# Patient Record
Sex: Male | Born: 1935
Health system: Southern US, Community
[De-identification: ages and names within clinical notes are randomized; demographics above are authoritative.]

## PROBLEM LIST (undated history)

## (undated) DIAGNOSIS — K449 Diaphragmatic hernia without obstruction or gangrene: Secondary | ICD-10-CM

## (undated) DIAGNOSIS — I10 Essential (primary) hypertension: Secondary | ICD-10-CM

## (undated) DIAGNOSIS — K922 Gastrointestinal hemorrhage, unspecified: Secondary | ICD-10-CM

## (undated) DIAGNOSIS — Z5189 Encounter for other specified aftercare: Secondary | ICD-10-CM

## (undated) DIAGNOSIS — S2249XA Multiple fractures of ribs, unspecified side, initial encounter for closed fracture: Secondary | ICD-10-CM

## (undated) DIAGNOSIS — S62109A Fracture of unspecified carpal bone, unspecified wrist, initial encounter for closed fracture: Secondary | ICD-10-CM

## (undated) DIAGNOSIS — N189 Chronic kidney disease, unspecified: Secondary | ICD-10-CM

## (undated) DIAGNOSIS — H5350 Unspecified color vision deficiencies: Secondary | ICD-10-CM

## (undated) DIAGNOSIS — K219 Gastro-esophageal reflux disease without esophagitis: Secondary | ICD-10-CM

## (undated) DIAGNOSIS — S069XAA Unspecified intracranial injury with loss of consciousness status unknown, initial encounter: Secondary | ICD-10-CM

## (undated) DIAGNOSIS — C4491 Basal cell carcinoma of skin, unspecified: Secondary | ICD-10-CM

## (undated) DIAGNOSIS — I82409 Acute embolism and thrombosis of unspecified deep veins of unspecified lower extremity: Secondary | ICD-10-CM

## (undated) DIAGNOSIS — R480 Dyslexia and alexia: Secondary | ICD-10-CM

## (undated) HISTORY — DX: Acute embolism and thrombosis of unspecified deep veins of unspecified lower extremity: I82.409

## (undated) HISTORY — PX: SPINE SURGERY: SHX786

## (undated) HISTORY — DX: Encounter for other specified aftercare: Z51.89

## (undated) HISTORY — PX: HERNIA REPAIR: SHX51

## (undated) HISTORY — PX: TONSILLECTOMY: SUR1361

## (undated) HISTORY — PX: CRANIECTOMY FOR DEPRESSED SKULL FRACTURE: SHX5788

---

## 2005-12-12 ENCOUNTER — Emergency Department: Payer: Self-pay | Admitting: Emergency Medicine

## 2007-03-08 ENCOUNTER — Emergency Department: Payer: Self-pay | Admitting: Unknown Physician Specialty

## 2007-04-30 ENCOUNTER — Inpatient Hospital Stay: Payer: Self-pay | Admitting: Internal Medicine

## 2007-07-03 ENCOUNTER — Ambulatory Visit: Payer: Self-pay | Admitting: Gastroenterology

## 2010-05-02 LAB — HM COLONOSCOPY

## 2011-08-30 ENCOUNTER — Ambulatory Visit: Payer: Self-pay | Admitting: Family Medicine

## 2012-05-02 HISTORY — PX: COLONOSCOPY: SHX174

## 2012-12-24 ENCOUNTER — Ambulatory Visit: Payer: Self-pay | Admitting: Emergency Medicine

## 2012-12-26 ENCOUNTER — Ambulatory Visit: Payer: Self-pay | Admitting: Family Medicine

## 2015-04-10 ENCOUNTER — Ambulatory Visit (INDEPENDENT_AMBULATORY_CARE_PROVIDER_SITE_OTHER): Payer: Medicare Other | Admitting: Family Medicine

## 2015-04-10 ENCOUNTER — Encounter: Payer: Self-pay | Admitting: Family Medicine

## 2015-04-10 VITALS — BP 110/62 | HR 88 | Temp 97.6°F | Ht 70.0 in | Wt 177.0 lb

## 2015-04-10 DIAGNOSIS — H66011 Acute suppurative otitis media with spontaneous rupture of ear drum, right ear: Secondary | ICD-10-CM | POA: Diagnosis not present

## 2015-04-10 DIAGNOSIS — J0100 Acute maxillary sinusitis, unspecified: Secondary | ICD-10-CM | POA: Diagnosis not present

## 2015-04-10 MED ORDER — OFLOXACIN 0.3 % OT SOLN: 5.0000 [drp] | Freq: Every day | OTIC | Status: DC

## 2015-04-10 MED ORDER — AMOXICILLIN-POT CLAVULANATE 875-125 MG PO TABS: 1.0000 | ORAL_TABLET | Freq: Two times a day (BID) | ORAL | Status: DC

## 2015-04-10 NOTE — Progress Notes (Signed)
Name: Clayton [name]   MRN: [id number]    DOB: 1934/09/01   Date:04/10/2015       Progress Note  Subjective  Chief Complaint  Chief Complaint  Patient presents with  . Sinusitis    drainage x 4 days- R) ear pain, cough, nasal drainage with yellow production    Sinusitis This is a new problem. The current episode started in the past 7 days. The problem has been waxing and waning since onset. There has been no fever. His pain is at a severity of 1/10 ("nuisence" ear). The pain is mild. Associated symptoms include congestion, coughing, ear pain and sinus pressure. Pertinent negatives include no chills, diaphoresis, headaches, hoarse voice, neck pain, shortness of breath, sneezing, sore throat or swollen glands. Past treatments include acetaminophen and oral decongestants. The treatment provided mild relief.    No problem-specific assessment & plan notes found for this encounter.   History reviewed. No pertinent past medical history.  Past Surgical History  Procedure Laterality Date  . Craniectomy for depressed skull fracture    . Colonoscopy  2014    cleared for 5 yrs    Family History  Problem Relation Age of Onset  . Diabetes Father     Social History   Social History  . Marital Status: Married    Spouse Name: N/A  . Number of Children: N/A  . Years of Education: N/A   Occupational History  . Not on file.   Social History Main Topics  . Smoking status: Former Research scientist (life sciences)  . Smokeless tobacco: Not on file  . Alcohol Use: 0.0 oz/week    0 Standard drinks or equivalent per week  . Drug Use: No  . Sexual Activity: Not Currently   Other Topics Concern  . Not on file   Social History Narrative  . No narrative on file    Allergies  Allergen Reactions  . Sulfa Antibiotics      Review of Systems  Constitutional: Negative for fever, chills, weight loss, malaise/fatigue and diaphoresis.  HENT: Positive for congestion, ear pain and sinus pressure. Negative for ear  discharge, hoarse voice, sneezing and sore throat.   Eyes: Negative for blurred vision.  Respiratory: Positive for cough. Negative for sputum production, shortness of breath and wheezing.   Cardiovascular: Negative for chest pain, palpitations and leg swelling.  Gastrointestinal: Negative for heartburn, nausea, abdominal pain, diarrhea, constipation, blood in stool and melena.  Genitourinary: Negative for dysuria, urgency, frequency and hematuria.  Musculoskeletal: Negative for myalgias, back pain, joint pain and neck pain.  Skin: Negative for rash.  Neurological: Negative for dizziness, tingling, sensory change, focal weakness and headaches.  Endo/Heme/Allergies: Negative for environmental allergies and polydipsia. Does not bruise/bleed easily.  Psychiatric/Behavioral: Negative for depression and suicidal ideas. The patient is not nervous/anxious and does not have insomnia.      Objective  Filed Vitals:   04/10/15 0854  BP: 110/62  Pulse: 88  Temp: 97.6 F (36.4 C)  TempSrc: Oral  Height: 5\' 10"  (1.778 m)  Weight: 177 lb (80.287 kg)    Physical Exam  Constitutional: He is oriented to person, place, and time and well-developed, well-nourished, and in no distress.  HENT:  Head: Normocephalic.  Right Ear: External ear normal. Tympanic membrane is perforated and erythematous.  Left Ear: Hearing, tympanic membrane, external ear and ear canal normal.  Nose: Nose normal.  Mouth/Throat: Oropharynx is clear and moist.  Eyes: Conjunctivae and EOM are normal. Pupils are equal, round, and reactive to light.  Right eye exhibits no discharge. Left eye exhibits no discharge. No scleral icterus.  Neck: Normal range of motion. Neck supple. No JVD present. No tracheal deviation present. No thyromegaly present.  Cardiovascular: Normal rate, regular rhythm, normal heart sounds and intact distal pulses.  Exam reveals no gallop and no friction rub.   No murmur heard. Pulmonary/Chest: Breath sounds  normal. No respiratory distress. He has no wheezes. He has no rales.  Abdominal: Soft. Bowel sounds are normal. He exhibits no mass. There is no hepatosplenomegaly. There is no tenderness. There is no rebound, no guarding and no CVA tenderness.  Musculoskeletal: Normal range of motion. He exhibits no edema or tenderness.  Lymphadenopathy:    He has no cervical adenopathy.  Neurological: He is alert and oriented to person, place, and time. He has normal sensation, normal strength, normal reflexes and intact cranial nerves. No cranial nerve deficit.  Skin: Skin is warm. No rash noted.  Psychiatric: Mood and affect normal.      Assessment & Plan  Problem List Items Addressed This Visit    None    Visit Diagnoses    Acute suppurative otitis media of right ear with spontaneous rupture of tympanic membrane, recurrence not specified    -  Primary    Relevant Medications    amoxicillin-clavulanate (AUGMENTIN) 875-125 MG tablet    ofloxacin (FLOXIN OTIC) 0.3 % otic solution    Other Relevant Orders    Ambulatory referral to ENT    Acute maxillary sinusitis, recurrence not specified        Relevant Medications    amoxicillin-clavulanate (AUGMENTIN) 875-125 MG tablet         Dr. Deanna Jones [facility]  04/10/2015

## 2015-12-15 ENCOUNTER — Ambulatory Visit (INDEPENDENT_AMBULATORY_CARE_PROVIDER_SITE_OTHER): Payer: Medicare Other | Admitting: Family Medicine

## 2015-12-15 ENCOUNTER — Encounter: Payer: Self-pay | Admitting: Family Medicine

## 2015-12-15 VITALS — BP 120/78 | HR 68 | Ht 70.0 in | Wt 175.0 lb

## 2015-12-15 DIAGNOSIS — L309 Dermatitis, unspecified: Secondary | ICD-10-CM | POA: Diagnosis not present

## 2015-12-15 MED ORDER — TRIAMCINOLONE ACETONIDE 0.1 % EX CREA: 1.0000 "application " | TOPICAL_CREAM | Freq: Two times a day (BID) | CUTANEOUS | 0 refills | Status: DC

## 2015-12-15 MED ORDER — DOXYCYCLINE HYCLATE 100 MG PO TABS: 100.0000 mg | ORAL_TABLET | Freq: Two times a day (BID) | ORAL | 0 refills | Status: DC

## 2015-12-15 NOTE — Progress Notes (Signed)
Name: Clayton [name]   MRN: [id number]    DOB: 04-May-1934   Date:12/15/2015       Progress Note  Subjective  Chief Complaint  Chief Complaint  Patient presents with  . Rash    bumps at the top R) side of back- itches x 1 month    Rash  This is a new problem. The current episode started more than 1 month ago. The problem has been gradually worsening since onset. The affected locations include the back (upper back). The rash is characterized by itchiness. He was exposed to nothing. Pertinent negatives include no anorexia, congestion, cough, diarrhea, fever, joint pain, shortness of breath or sore throat. Past treatments include antibiotic cream. The treatment provided mild relief. His past medical history is significant for eczema.    No problem-specific Assessment & Plan notes found for this encounter.   History reviewed. No pertinent past medical history.  Past Surgical History:  Procedure Laterality Date  . COLONOSCOPY  2014   cleared for 5 yrs  . CRANIECTOMY FOR DEPRESSED SKULL FRACTURE      Family History  Problem Relation Age of Onset  . Diabetes Father     Social History   Social History  . Marital status: Married    Spouse name: N/A  . Number of children: N/A  . Years of education: N/A   Occupational History  . Not on file.   Social History Main Topics  . Smoking status: Former Research scientist (life sciences)  . Smokeless tobacco: Never Used  . Alcohol use 0.0 oz/week  . Drug use: No  . Sexual activity: Not Currently   Other Topics Concern  . Not on file   Social History Narrative  . No narrative on file    Allergies  Allergen Reactions  . Sulfa Antibiotics      Review of Systems  Constitutional: Negative for chills, diaphoresis, fever, malaise/fatigue and weight loss.  HENT: Negative for congestion, ear discharge, ear pain, hearing loss, nosebleeds, sore throat and tinnitus.   Eyes: Negative for blurred vision and double vision.  Respiratory: Negative for cough,  sputum production, shortness of breath and wheezing.   Cardiovascular: Negative for chest pain, palpitations and leg swelling.  Gastrointestinal: Negative for abdominal pain, anorexia, blood in stool, constipation, diarrhea, heartburn, melena and nausea.  Genitourinary: Negative for dysuria, frequency, hematuria and urgency.  Musculoskeletal: Negative for back pain, joint pain, myalgias and neck pain.  Skin: Positive for rash.  Neurological: Negative for dizziness, tingling, sensory change, focal weakness, weakness and headaches.  Endo/Heme/Allergies: Negative for environmental allergies and polydipsia. Does not bruise/bleed easily.  Psychiatric/Behavioral: Negative for depression and suicidal ideas. The patient is not nervous/anxious and does not have insomnia.      Objective  Vitals:   12/15/15 1437  BP: 120/78  Pulse: 68  Weight: 175 lb (79.4 kg)  Height: 5\' 10"  (1.778 m)    Physical Exam  Constitutional: He is oriented to person, place, and time and well-developed, well-nourished, and in no distress.  HENT:  Head: Normocephalic.  Right Ear: External ear normal.  Left Ear: External ear normal.  Nose: Nose normal.  Mouth/Throat: Oropharynx is clear and moist.  Eyes: Conjunctivae and EOM are normal. Pupils are equal, round, and reactive to light. Right eye exhibits no discharge. Left eye exhibits no discharge. No scleral icterus.  Neck: Normal range of motion. Neck supple. No JVD present. No tracheal deviation present. No thyromegaly present.  Cardiovascular: Normal rate, regular rhythm, normal heart sounds and intact distal  pulses.  Exam reveals no gallop and no friction rub.   No murmur heard. Pulmonary/Chest: Breath sounds normal. No respiratory distress. He has no wheezes. He has no rales.  Abdominal: Soft. Bowel sounds are normal. He exhibits no mass. There is no hepatosplenomegaly. There is no tenderness. There is no rebound, no guarding and no CVA tenderness.   Musculoskeletal: Normal range of motion. He exhibits no edema or tenderness.  Lymphadenopathy:    He has no cervical adenopathy.  Neurological: He is alert and oriented to person, place, and time. He has normal sensation, normal strength, normal reflexes and intact cranial nerves. No cranial nerve deficit.  Skin: Skin is warm. No rash noted.  Psychiatric: Mood and affect normal.  Nursing note and vitals reviewed.     Assessment & Plan  Problem List Items Addressed This Visit    None    Visit Diagnoses    Eczema    -  Primary   Relevant Medications   triamcinolone cream (KENALOG) 0.1 %   doxycycline (VIBRA-TABS) 100 MG tablet        Dr. Macon [facility]  12/15/15

## 2015-12-15 NOTE — Patient Instructions (Signed)
Eczema Eczema, also called atopic dermatitis, is a skin disorder that causes inflammation of the skin. It causes a red rash and dry, scaly skin. The skin becomes very itchy. Eczema is generally worse during the cooler winter months and often improves with the warmth of summer. Eczema usually starts showing signs in infancy. Some children outgrow eczema, but it may last through adulthood.  CAUSES  The exact cause of eczema is not known, but it appears to run in families. People with eczema often have a family history of eczema, allergies, asthma, or hay fever. Eczema is not contagious. Flare-ups of the condition may be caused by:   Contact with something you are sensitive or allergic to.   Stress. SIGNS AND SYMPTOMS  Dry, scaly skin.   Red, itchy rash.   Itchiness. This may occur before the skin rash and may be very intense.  DIAGNOSIS  The diagnosis of eczema is usually made based on symptoms and medical history. TREATMENT  Eczema cannot be cured, but symptoms usually can be controlled with treatment and other strategies. A treatment plan might include:  Controlling the itching and scratching.   Use over-the-counter antihistamines as directed for itching. This is especially useful at night when the itching tends to be worse.   Use over-the-counter steroid creams as directed for itching.   Avoid scratching. Scratching makes the rash and itching worse. It may also result in a skin infection (impetigo) due to a break in the skin caused by scratching.   Keeping the skin well moisturized with creams every day. This will seal in moisture and help prevent dryness. Lotions that contain alcohol and water should be avoided because they can dry the skin.   Limiting exposure to things that you are sensitive or allergic to (allergens).   Recognizing situations that cause stress.   Developing a plan to manage stress.  HOME CARE INSTRUCTIONS   Only take over-the-counter or  prescription medicines as directed by your health care provider.   Do not use anything on the skin without checking with your health care provider.   Keep baths or showers short (5 minutes) in warm (not hot) water. Use mild cleansers for bathing. These should be unscented. You may add nonperfumed bath oil to the bath water. It is best to avoid soap and bubble bath.   Immediately after a bath or shower, when the skin is still damp, apply a moisturizing ointment to the entire body. This ointment should be a petroleum ointment. This will seal in moisture and help prevent dryness. The thicker the ointment, the better. These should be unscented.   Keep fingernails cut short. Children with eczema may need to wear soft gloves or mittens at night after applying an ointment.   Dress in clothes made of cotton or cotton blends. Dress lightly, because heat increases itching.   A child with eczema should stay away from anyone with fever blisters or cold sores. The virus that causes fever blisters (herpes simplex) can cause a serious skin infection in children with eczema. SEEK MEDICAL CARE IF:   Your itching interferes with sleep.   Your rash gets worse or is not better within 1 week after starting treatment.   You see pus or soft yellow scabs in the rash area.   You have a fever.   You have a rash flare-up after contact with someone who has fever blisters.    This information is not intended to replace advice given to you by your health care   provider. Make sure you discuss any questions you have with your health care provider.   Document Released: 04/15/2000 Document Revised: 02/06/2013 Document Reviewed: 11/19/2012 Elsevier Interactive Patient Education 2016 [employer] Inc[employer]  

## 2015-12-16 ENCOUNTER — Ambulatory Visit (INDEPENDENT_AMBULATORY_CARE_PROVIDER_SITE_OTHER): Payer: Medicare Other | Admitting: Family Medicine

## 2015-12-16 ENCOUNTER — Encounter: Payer: Self-pay | Admitting: Family Medicine

## 2015-12-16 VITALS — BP 118/84 | HR 76 | Ht 70.0 in | Wt 175.0 lb

## 2015-12-16 DIAGNOSIS — R3911 Hesitancy of micturition: Secondary | ICD-10-CM

## 2015-12-16 DIAGNOSIS — Z1211 Encounter for screening for malignant neoplasm of colon: Secondary | ICD-10-CM

## 2015-12-16 DIAGNOSIS — Z0000 Encounter for general adult medical examination without abnormal findings: Secondary | ICD-10-CM | POA: Diagnosis not present

## 2015-12-16 LAB — POCT URINALYSIS DIPSTICK
Bilirubin, UA: NEGATIVE
Blood, UA: NEGATIVE
Glucose, UA: NEGATIVE
Ketones, UA: NEGATIVE
LEUKOCYTES UA: NEGATIVE
Nitrite, UA: NEGATIVE
PROTEIN UA: NEGATIVE
Spec Grav, UA: 1.02
UROBILINOGEN UA: 0.2
pH, UA: 6

## 2015-12-16 LAB — POC HEMOCCULT BLD/STL (OFFICE/1-CARD/DIAGNOSTIC): Card #1 Date: NEGATIVE

## 2015-12-16 NOTE — Progress Notes (Signed)
Name: Clayton [name]   MRN: [id number]    DOB: 26-Jan-1935   Date:12/16/2015       Progress Note  Subjective  Chief Complaint  Chief Complaint  Patient presents with  . Annual Exam    no problems    Patient presents for annual physical exam.    No problem-specific Assessment & Plan notes found for this encounter.   History reviewed. No pertinent past medical history.  Past Surgical History:  Procedure Laterality Date  . COLONOSCOPY  2014   cleared for 5 yrs  . CRANIECTOMY FOR DEPRESSED SKULL FRACTURE      Family History  Problem Relation Age of Onset  . Diabetes Father     Social History   Social History  . Marital status: Married    Spouse name: N/A  . Number of children: N/A  . Years of education: N/A   Occupational History  . Not on file.   Social History Main Topics  . Smoking status: Former Research scientist (life sciences)  . Smokeless tobacco: Never Used  . Alcohol use 0.0 oz/week  . Drug use: No  . Sexual activity: Not Currently   Other Topics Concern  . Not on file   Social History Narrative  . No narrative on file    Allergies  Allergen Reactions  . Sulfa Antibiotics      Review of Systems  Constitutional: Negative for chills, fever, malaise/fatigue and weight loss.  HENT: Negative for ear discharge, ear pain and sore throat.   Eyes: Negative for blurred vision.  Respiratory: Negative for cough, sputum production, shortness of breath and wheezing.   Cardiovascular: Negative for chest pain, palpitations and leg swelling.  Gastrointestinal: Negative for abdominal pain, blood in stool, constipation, diarrhea, heartburn, melena and nausea.  Genitourinary: Negative for dysuria, frequency, hematuria and urgency.  Musculoskeletal: Negative for back pain, joint pain, myalgias and neck pain.  Skin: Negative for rash.  Neurological: Negative for dizziness, tingling, sensory change, focal weakness and headaches.  Endo/Heme/Allergies: Negative for environmental  allergies and polydipsia. Does not bruise/bleed easily.  Psychiatric/Behavioral: Negative for depression and suicidal ideas. The patient is not nervous/anxious and does not have insomnia.      Objective  Vitals:   12/16/15 0845  BP: 118/84  Pulse: 76  Weight: 175 lb (79.4 kg)  Height: 5\' 10"  (1.778 m)    Physical Exam  Constitutional: He is oriented to person, place, and time and well-developed, well-nourished, and in no distress.  HENT:  Head: Normocephalic.  Right Ear: External ear normal.  Left Ear: External ear normal.  Nose: Nose normal.  Mouth/Throat: Oropharynx is clear and moist.  Eyes: Conjunctivae and EOM are normal. Pupils are equal, round, and reactive to light. Right eye exhibits no discharge. Left eye exhibits no discharge. No scleral icterus.  Fundoscopic exam:      The right eye shows no arteriolar narrowing, no AV nicking and no papilledema.       The left eye shows no arteriolar narrowing, no AV nicking and no papilledema.  Neck: Normal range of motion. Neck supple. No JVD present. No tracheal deviation present. No thyromegaly present.  Cardiovascular: Normal rate, regular rhythm, normal heart sounds, intact distal pulses and normal pulses.  Exam reveals no gallop and no friction rub.   No murmur heard. Pulmonary/Chest: Breath sounds normal. No respiratory distress. He has no wheezes. He has no rales. He exhibits no tenderness.  Abdominal: Soft. Bowel sounds are normal. He exhibits no mass. There is no hepatosplenomegaly. There  is no tenderness. There is no rebound, no guarding and no CVA tenderness.  Genitourinary: Rectum normal and prostate normal. Rectal exam shows guaiac negative stool. No discharge found.  Musculoskeletal: Normal range of motion. He exhibits no edema or tenderness.  Lymphadenopathy:    He has no cervical adenopathy.  Neurological: He is alert and oriented to person, place, and time. He has normal sensation, normal strength, normal reflexes  and intact cranial nerves. No cranial nerve deficit.  Skin: Skin is warm. No rash noted.  Psychiatric: Mood and affect normal.  Vitals reviewed.     Assessment & Plan  Problem List Items Addressed This Visit    None    Visit Diagnoses    Annual physical exam    -  Primary   Relevant Orders   Renal Function Panel   Lipid Profile   PSA   POC Hemoccult Bld/Stl (1-Cd Office Dx) (Completed)   POCT Urinalysis Dipstick (Completed)   Hesitancy of micturition       Relevant Orders   PSA   Colon cancer screening            Dr. Otilio Miu [facility]  12/16/15

## 2015-12-17 LAB — RENAL FUNCTION PANEL
Albumin: 4.3 g/dL (ref 3.5–4.8)
BUN / CREAT RATIO: 11 (ref 10–24)
BUN: 13 mg/dL (ref 8–27)
CO2: 19 mmol/L (ref 18–29)
CREATININE: 1.22 mg/dL (ref 0.76–1.27)
Calcium: 9 mg/dL (ref 8.6–10.2)
Chloride: 100 mmol/L (ref 96–106)
GFR calc Af Amer: 65 mL/min/{1.73_m2} (ref >59)
GFR, EST NON AFRICAN AMERICAN: 56 mL/min/{1.73_m2} — AB (ref >59)
Glucose: 89 mg/dL (ref 65–99)
Phosphorus: 3.4 mg/dL (ref 2.5–4.5)
Potassium: 4.4 mmol/L (ref 3.5–5.2)
SODIUM: 141 mmol/L (ref 134–144)

## 2015-12-17 LAB — LIPID PANEL
CHOL/HDL RATIO: 3.5 ratio (ref 0.0–5.0)
Cholesterol, Total: 192 mg/dL (ref 100–199)
HDL: 55 mg/dL (ref >39)
LDL CALC: 117 mg/dL — AB (ref 0–99)
TRIGLYCERIDES: 102 mg/dL (ref 0–149)
VLDL CHOLESTEROL CAL: 20 mg/dL (ref 5–40)

## 2015-12-17 LAB — PSA: Prostate Specific Ag, Serum: 1.2 ng/mL (ref 0.0–4.0)

## 2016-03-07 ENCOUNTER — Ambulatory Visit (INDEPENDENT_AMBULATORY_CARE_PROVIDER_SITE_OTHER): Payer: Medicare Other | Admitting: Family Medicine

## 2016-03-07 ENCOUNTER — Encounter: Payer: Self-pay | Admitting: Family Medicine

## 2016-03-07 VITALS — BP 120/76 | HR 64 | Ht 70.0 in | Wt 176.0 lb

## 2016-03-07 DIAGNOSIS — M5431 Sciatica, right side: Secondary | ICD-10-CM | POA: Diagnosis not present

## 2016-03-07 MED ORDER — MELOXICAM 15 MG PO TABS: 15.0000 mg | ORAL_TABLET | Freq: Every day | ORAL | 1 refills | Status: DC

## 2016-03-07 NOTE — Patient Instructions (Signed)

## 2016-03-07 NOTE — Progress Notes (Signed)
Name: Clayton [name]   MRN: [id number]    DOB: 11/11/1934   Date:03/07/2016       Progress Note  Subjective  Chief Complaint  Chief Complaint  Patient presents with  . Hip Pain    R) hip pain radiating down R) leg- constant, ache- Ibuprofen helps. Been going on x 3 weeks.    Hip Pain   The incident occurred more than 1 week ago. There was no injury mechanism. The pain is present in the right leg. The quality of the pain is described as aching. The pain is at a severity of 6/10. The pain is moderate. The pain has been fluctuating since onset. Pertinent negatives include no inability to bear weight, loss of motion, loss of sensation, muscle weakness, numbness or tingling. He reports no foreign bodies present. Nothing aggravates the symptoms. He has tried NSAIDs, rest and non-weight bearing for the symptoms. The treatment provided mild relief.    No problem-specific Assessment & Plan notes found for this encounter.   History reviewed. No pertinent past medical history.  Past Surgical History:  Procedure Laterality Date  . COLONOSCOPY  2014   cleared for 5 yrs  . CRANIECTOMY FOR DEPRESSED SKULL FRACTURE      Family History  Problem Relation Age of Onset  . Diabetes Father     Social History   Social History  . Marital status: Married    Spouse name: N/A  . Number of children: N/A  . Years of education: N/A   Occupational History  . Not on file.   Social History Main Topics  . Smoking status: Former Research scientist (life sciences)  . Smokeless tobacco: Never Used  . Alcohol use 0.0 oz/week  . Drug use: No  . Sexual activity: Not Currently   Other Topics Concern  . Not on file   Social History Narrative  . No narrative on file    Allergies  Allergen Reactions  . Sulfa Antibiotics      Review of Systems  Constitutional: Negative for chills, fever, malaise/fatigue and weight loss.  HENT: Negative for ear discharge, ear pain and sore throat.   Eyes: Negative for blurred vision.   Respiratory: Negative for cough, sputum production, shortness of breath and wheezing.   Cardiovascular: Negative for chest pain, palpitations and leg swelling.  Gastrointestinal: Negative for abdominal pain, blood in stool, constipation, diarrhea, heartburn, melena and nausea.  Genitourinary: Negative for dysuria, frequency, hematuria and urgency.  Musculoskeletal: Negative for back pain, joint pain, myalgias and neck pain.  Skin: Negative for rash.  Neurological: Negative for dizziness, tingling, sensory change, focal weakness, numbness and headaches.  Endo/Heme/Allergies: Negative for environmental allergies and polydipsia. Does not bruise/bleed easily.  Psychiatric/Behavioral: Negative for depression and suicidal ideas. The patient is not nervous/anxious and does not have insomnia.      Objective  Vitals:   03/07/16 1436  BP: 120/76  Pulse: 64  Weight: 176 lb (79.8 kg)  Height: 5\' 10"  (1.778 m)    Physical Exam  Constitutional: He is oriented to person, place, and time and well-developed, well-nourished, and in no distress.  HENT:  Head: Normocephalic.  Right Ear: External ear normal.  Left Ear: External ear normal.  Nose: Nose normal.  Mouth/Throat: Oropharynx is clear and moist.  Eyes: Conjunctivae and EOM are normal. Pupils are equal, round, and reactive to light. Right eye exhibits no discharge. Left eye exhibits no discharge. No scleral icterus.  Neck: Normal range of motion. Neck supple. No JVD present. No tracheal deviation  present. No thyromegaly present.  Cardiovascular: Normal rate, regular rhythm, normal heart sounds and intact distal pulses.  Exam reveals no gallop and no friction rub.   No murmur heard. Pulmonary/Chest: Breath sounds normal. No respiratory distress. He has no wheezes. He has no rales.  Abdominal: Soft. Bowel sounds are normal. He exhibits no mass. There is no hepatosplenomegaly. There is no tenderness. There is no rebound, no guarding and no CVA  tenderness.  Musculoskeletal: Normal range of motion. He exhibits no edema or tenderness.  Lymphadenopathy:    He has no cervical adenopathy.  Neurological: He is alert and oriented to person, place, and time. He has normal sensation, normal strength, normal reflexes and intact cranial nerves. No cranial nerve deficit. He has a normal Straight Leg Raise Test.  Skin: Skin is warm. No rash noted.  Psychiatric: Mood and affect normal.  Nursing note and vitals reviewed.     Assessment & Plan  Problem List Items Addressed This Visit    None    Visit Diagnoses    Sciatica of right side    -  Primary   Relevant Medications   meloxicam (MOBIC) 15 MG tablet        Dr. [facility]  03/07/16

## 2016-09-14 ENCOUNTER — Ambulatory Visit (INDEPENDENT_AMBULATORY_CARE_PROVIDER_SITE_OTHER): Payer: Medicare Other

## 2016-09-14 ENCOUNTER — Ambulatory Visit (INDEPENDENT_AMBULATORY_CARE_PROVIDER_SITE_OTHER): Admit: 2016-09-14 | Discharge: 2016-09-14 | Disposition: A | Discharge status: Home / Self Care | Payer: Medicare Other

## 2016-09-14 ENCOUNTER — Ambulatory Visit
Admission: EM | Admit: 2016-09-14 | Discharge: 2016-09-14 | Disposition: A | Discharge status: Home / Self Care | Payer: Medicare Other | Attending: Family Medicine | Admitting: Family Medicine

## 2016-09-14 DIAGNOSIS — S0181XA Laceration without foreign body of other part of head, initial encounter: Secondary | ICD-10-CM | POA: Diagnosis not present

## 2016-09-14 DIAGNOSIS — S0003XA Contusion of scalp, initial encounter: Secondary | ICD-10-CM | POA: Diagnosis not present

## 2016-09-14 DIAGNOSIS — S6991XA Unspecified injury of right wrist, hand and finger(s), initial encounter: Secondary | ICD-10-CM

## 2016-09-14 DIAGNOSIS — S0990XA Unspecified injury of head, initial encounter: Secondary | ICD-10-CM

## 2016-09-14 DIAGNOSIS — M79641 Pain in right hand: Secondary | ICD-10-CM

## 2016-09-14 DIAGNOSIS — S069X9A Unspecified intracranial injury with loss of consciousness of unspecified duration, initial encounter: Secondary | ICD-10-CM | POA: Diagnosis not present

## 2016-09-14 MED ORDER — MUPIROCIN CALCIUM 2 % EX CREA: 1.0000 "application " | TOPICAL_CREAM | Freq: Two times a day (BID) | CUTANEOUS | 0 refills | Status: DC

## 2016-09-14 MED ORDER — TETANUS-DIPHTH-ACELL PERTUSSIS 5-2.5-18.5 LF-MCG/0.5 IM SUSP
0.5000 mL | Freq: Once | INTRAMUSCULAR | Status: AC
  Administered 2016-09-14: 0.5 mL via INTRAMUSCULAR

## 2016-09-14 MED ORDER — MELOXICAM 15 MG PO TABS: 15.0000 mg | ORAL_TABLET | Freq: Every day | ORAL | 0 refills | Status: DC

## 2016-09-14 MED ORDER — TETANUS-DIPHTH-ACELL PERTUSSIS 5-2.5-18.5 LF-MCG/0.5 IM SUSP: 0.5000 mL | Freq: Once | INTRAMUSCULAR | Status: DC

## 2016-09-14 NOTE — ED Notes (Signed)
Obtained Prior Authorization for Head CT w/o contrast. Valid Dates--09/14/2016-10/13/2016 Authorization #[id number]
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
## 2016-09-14 NOTE — ED Triage Notes (Signed)
Patient reports that today he was helping someone and tripped on the sidewalk. Patient reports that he fell and hit his head, patient reports no loss of consciousness. Patient reports that he does have some right hand pain and abrasion. Patient states that he scraped his knee but was most concerned of laceration above right eye.

## 2016-09-22 ENCOUNTER — Ambulatory Visit
Admission: EM | Admit: 2016-09-22 | Discharge: 2016-09-22 | Disposition: A | Discharge status: Home / Self Care | Payer: Medicare Other | Attending: Family Medicine | Admitting: Family Medicine

## 2016-09-22 DIAGNOSIS — Z4802 Encounter for removal of sutures: Secondary | ICD-10-CM

## 2016-09-22 NOTE — ED Triage Notes (Signed)
Here for staple removal. Site appears well healed.

## 2016-10-10 DIAGNOSIS — H2513 Age-related nuclear cataract, bilateral: Secondary | ICD-10-CM | POA: Diagnosis not present

## 2017-05-16 ENCOUNTER — Other Ambulatory Visit
Admission: RE | Admit: 2017-05-16 | Discharge: 2017-05-16 | Disposition: A | Discharge status: Home / Self Care | Payer: Medicare Other | Source: Ambulatory Visit | Attending: Family Medicine | Admitting: Family Medicine

## 2017-05-16 ENCOUNTER — Ambulatory Visit: Payer: Medicare Other | Admitting: Family Medicine

## 2017-05-16 ENCOUNTER — Ambulatory Visit
Admission: RE | Admit: 2017-05-16 | Discharge: 2017-05-16 | Disposition: A | Discharge status: Home / Self Care | Payer: Medicare Other | Source: Ambulatory Visit | Attending: Family Medicine | Admitting: Family Medicine

## 2017-05-16 ENCOUNTER — Encounter: Payer: Self-pay | Admitting: Family Medicine

## 2017-05-16 VITALS — BP 120/80 | HR 80 | Ht 69.0 in | Wt 177.0 lb

## 2017-05-16 DIAGNOSIS — I82409 Acute embolism and thrombosis of unspecified deep veins of unspecified lower extremity: Secondary | ICD-10-CM | POA: Diagnosis not present

## 2017-05-16 DIAGNOSIS — I824Y1 Acute embolism and thrombosis of unspecified deep veins of right proximal lower extremity: Secondary | ICD-10-CM | POA: Diagnosis not present

## 2017-05-16 DIAGNOSIS — I82431 Acute embolism and thrombosis of right popliteal vein: Secondary | ICD-10-CM | POA: Diagnosis not present

## 2017-05-16 DIAGNOSIS — M7989 Other specified soft tissue disorders: Secondary | ICD-10-CM

## 2017-05-16 DIAGNOSIS — I82491 Acute embolism and thrombosis of other specified deep vein of right lower extremity: Secondary | ICD-10-CM | POA: Diagnosis not present

## 2017-05-16 DIAGNOSIS — O2230 Deep phlebothrombosis in pregnancy, unspecified trimester: Secondary | ICD-10-CM

## 2017-05-16 DIAGNOSIS — I82411 Acute embolism and thrombosis of right femoral vein: Secondary | ICD-10-CM | POA: Insufficient documentation

## 2017-05-16 LAB — CBC
HEMATOCRIT: 46.2 % (ref 40.0–52.0)
Hemoglobin: 15.9 g/dL (ref 13.0–18.0)
MCH: 33.6 pg (ref 26.0–34.0)
MCHC: 34.4 g/dL (ref 32.0–36.0)
MCV: 97.6 fL (ref 80.0–100.0)
Platelets: 262 10*3/uL (ref 150–440)
RBC: 4.74 MIL/uL (ref 4.40–5.90)
RDW: 13.4 % (ref 11.5–14.5)
WBC: 10.9 10*3/uL — ABNORMAL HIGH (ref 3.8–10.6)

## 2017-05-16 LAB — RENAL FUNCTION PANEL
ANION GAP: 10 (ref 5–15)
Albumin: 4.2 g/dL (ref 3.5–5.0)
BUN: 19 mg/dL (ref 6–20)
CO2: 26 mmol/L (ref 22–32)
Calcium: 8.7 mg/dL — ABNORMAL LOW (ref 8.9–10.3)
Chloride: 104 mmol/L (ref 101–111)
Creatinine, Ser: 1.3 mg/dL — ABNORMAL HIGH (ref 0.61–1.24)
GFR calc Af Amer: 58 mL/min — ABNORMAL LOW (ref >60)
GFR calc non Af Amer: 50 mL/min — ABNORMAL LOW (ref >60)
GLUCOSE: 107 mg/dL — AB (ref 65–99)
PHOSPHORUS: 3.5 mg/dL (ref 2.5–4.6)
POTASSIUM: 4.1 mmol/L (ref 3.5–5.1)
Sodium: 140 mmol/L (ref 135–145)

## 2017-05-16 NOTE — Patient Instructions (Signed)
Deep Vein Thrombosis Deep vein thrombosis (DVT) is a condition in which a blood clot forms in a deep vein, such as a lower leg, thigh, or arm vein. A clot is blood that has thickened into a gel or solid. This condition is dangerous. It can lead to serious and even life-threatening complications if the clot travels to the lungs and causes a blockage (pulmonary embolism). It can also damage veins in the leg. This can result in leg pain, swelling, discoloration, and sores (post-thrombotic syndrome). What are the causes? This condition may be caused by:  A slowdown of blood flow.  Damage to a vein.  A condition that makes blood clot more easily.  What increases the risk? The following factors may make you more likely to develop this condition:  Being overweight.  Being elderly, especially over age 60.  Sitting or lying down for more than four hours.  Lack of physical activity (sedentary lifestyle).  Being pregnant, giving birth, or having recently given birth.  Taking medicines that contain estrogen.  Smoking.  A history of any of the following: ? Blood clots or blood clotting disease. ? Peripheral vascular disease. ? Inflammatory bowel disease. ? Cancer. ? Heart disease. ? Genetic conditions that affect how blood clots. ? Neurological diseases that affect the legs (leg paresis). ? Injury. ? Major or lengthy surgery. ? A central line placed inside a large vein.  What are the signs or symptoms? Symptoms of this condition include:  Swelling, pain, or tenderness in an arm or leg.  Warmth, redness, or discoloration in an arm or leg.  If the clot is in your leg, symptoms may be more noticeable or worse when you stand or walk. Some people do not have any symptoms. How is this diagnosed? This condition is diagnosed with:  A medical history.  A physical exam.  Tests, such as: ? Blood tests. These are done to see how your blood clots. ? Imaging tests. These are done to  check for clots. Tests may include:  Ultrasound.  CT scan.  MRI.  X-ray.  Venogram. For this test, X-rays are taken after a dye is injected into a vein.  How is this treated? Treatment for this condition depends on the cause, your risk for bleeding or developing more clots, and any medical conditions you have. Treatment may include:  Taking blood thinners (also called anticoagulants). These medicines may be taken by mouth, injected under the skin, or injected through an IV tube (catheter). These medicines prevent clots from forming.  Injecting medicine that dissolves blood clots into the affected vein (catheter-directed thrombolysis).  Having surgery. Surgery may be done to: ? Remove the clot. ? Place a filter in a large vein to catch blood clots before they reach the lungs.  Some treatments may be continued for up to six months. Follow these instructions at home: If you are taking an oral blood thinner:  Take the medicine exactly as told by your health care provider. Some blood thinners need to be taken at the same time every day. Do not skip a dose.  Ask your health care provider about what foods and drugs interact with the medicine.  Ask about possible side effects. General instructions  Blood thinners can cause easy bruising and difficulty stopping bleeding. Because of this, if you are taking or were given a blood thinner: ? Hold pressure over cuts for longer than usual. ? Tell your dentist and other health care providers that you are taking blood thinners before   having any procedures that can cause bleeding. ? Avoid contact sports.  Take over-the-counter and prescription medicines only as told by your health care provider.  Return to your normal activities as told by your health care provider. Ask your health care provider what activities are safe for you.  Wear compression stockings if recommended by your health care provider.  Keep all follow-up visits as told by  your health care provider. This is important. How is this prevented? To lower your risk of developing this condition again:  For 30 or more minutes every day, do an activity that: ? Involves moving your arms and legs. ? Increases your heart rate.  When traveling for longer than four hours: ? Exercise your arms and legs every hour. ? Drink plenty of water. ? Avoid drinking alcohol.  Avoid sitting or lying for a long time without moving your legs.  Stay a healthy weight.  If you are a woman who is older than age 35, avoid unnecessary use of medicines that contain estrogen.  Do not use any products that contain nicotine or tobacco, such as cigarettes and e-cigarettes. This is especially important if you take estrogen medicines. If you need help quitting, ask your health care provider.  Contact a health care provider if:  You miss a dose of your blood thinner.  You have nausea, vomiting, or diarrhea that lasts for more than one day.  Your menstrual period is heavier than usual.  You have unusual bruising. Get help right away if:  You have new or increased pain, swelling, or redness in an arm or leg.  You have numbness or tingling in an arm or leg.  You have shortness of breath.  You have chest pain.  You have a rapid or irregular heartbeat.  You feel light-headed or dizzy.  You cough up blood.  There is blood in your vomit, stool, or urine.  You have a serious fall or accident, or you hit your head.  You have a severe headache or confusion.  You have a cut that will not stop bleeding. These symptoms may represent a serious problem that is an emergency. Do not wait to see if the symptoms will go away. Get medical help right away. Call your local emergency services (911 in the [place] Do not drive yourself to the hospital. Summary  DVT is a condition in which a blood clot forms in a deep vein, such as a lower leg, thigh, or arm vein.  Symptoms can include swelling,  warmth, pain, and redness in your leg or arm.  Treatment may include taking blood thinners, injecting medicine that dissolves blood clots,wearing compression stockings, or surgery.  If you are prescribed blood thinners, take them exactly as told. This information is not intended to replace advice given to you by your health care provider. Make sure you discuss any questions you have with your health care provider. Document Released: 04/18/2005 Document Revised: 05/21/2016 Document Reviewed: 05/21/2016 Elsevier Interactive Patient Education  2018 [employer]  

## 2017-05-16 NOTE — Progress Notes (Signed)
Name: Clayton [name]   MRN: [id number]    DOB: 05/18/1934   Date:05/16/2017       Progress Note  Subjective  Chief Complaint  Chief Complaint  Patient presents with  . calf pain    R) calf pain- swollen and constant/ no long rides or sitting for long periods of time    Leg Pain   The incident occurred 6 to 12 hours ago (awaken last night). The incident occurred at home. There was no injury mechanism. Pain location: right calf. The quality of the pain is described as aching. The pain is at a severity of 5/10. The pain is moderate. The pain has been constant since onset. Pertinent negatives include no inability to bear weight, loss of motion, loss of sensation, muscle weakness, numbness or tingling. The symptoms are aggravated by movement. He has tried nothing for the symptoms.    No problem-specific Assessment & Plan notes found for this encounter.   History reviewed. No pertinent past medical history.  Past Surgical History:  Procedure Laterality Date  . COLONOSCOPY  2014   cleared for 5 yrs  . CRANIECTOMY FOR DEPRESSED SKULL FRACTURE      Family History  Problem Relation Age of Onset  . Diabetes Father     Social History   Socioeconomic History  . Marital status: Married    Spouse name: Not on file  . Number of children: Not on file  . Years of education: Not on file  . Highest education level: Not on file  Social Needs  . Financial resource strain: Not on file  . Food insecurity - worry: Not on file  . Food insecurity - inability: Not on file  . Transportation needs - medical: Not on file  . Transportation needs - non-medical: Not on file  Occupational History  . Not on file  Tobacco Use  . Smoking status: Former Research scientist (life sciences)  . Smokeless tobacco: Never Used  Substance and Sexual Activity  . Alcohol use: Yes    Alcohol/week: 0.0 oz  . Drug use: No  . Sexual activity: Not Currently  Other Topics Concern  . Not on file  Social History Narrative  . Not on file     Allergies  Allergen Reactions  . Sulfa Antibiotics     Outpatient Medications Prior to Visit  Medication Sig Dispense Refill  . meloxicam (MOBIC) 15 MG tablet Take 1 tablet (15 mg total) by mouth daily. 30 tablet 1  . meloxicam (MOBIC) 15 MG tablet Take 1 tablet (15 mg total) by mouth daily. 30 tablet 0  . mupirocin cream (BACTROBAN) 2 % Apply 1 application topically 2 (two) times daily. 15 g 0   No facility-administered medications prior to visit.     Review of Systems  Constitutional: Negative for chills, fever, malaise/fatigue and weight loss.  HENT: Negative for ear discharge, ear pain and sore throat.   Eyes: Negative for blurred vision.  Respiratory: Negative for cough, sputum production, shortness of breath and wheezing.   Cardiovascular: Negative for chest pain, palpitations and leg swelling.  Gastrointestinal: Negative for abdominal pain, blood in stool, constipation, diarrhea, heartburn, melena and nausea.  Genitourinary: Negative for dysuria, frequency, hematuria and urgency.  Musculoskeletal: Negative for back pain, joint pain, myalgias and neck pain.  Skin: Negative for rash.  Neurological: Negative for dizziness, tingling, sensory change, focal weakness, numbness and headaches.  Endo/Heme/Allergies: Negative for environmental allergies and polydipsia. Does not bruise/bleed easily.  Psychiatric/Behavioral: Negative for depression and suicidal ideas. The  patient is not nervous/anxious and does not have insomnia.      Objective  Vitals:   05/16/17 0901  BP: 120/80  Pulse: 80  Weight: 177 lb (80.3 kg)  Height: 5\' 9"  (1.753 m)    Physical Exam  Constitutional: He is oriented to person, place, and time and well-developed, well-nourished, and in no distress.  HENT:  Head: Normocephalic.  Right Ear: External ear normal.  Left Ear: External ear normal.  Nose: Nose normal.  Mouth/Throat: Oropharynx is clear and moist.  Eyes: Conjunctivae and EOM are normal.  Pupils are equal, round, and reactive to light. Right eye exhibits no discharge. Left eye exhibits no discharge. No scleral icterus.  Neck: Normal range of motion. Neck supple. No JVD present. No tracheal deviation present. No thyromegaly present.  Cardiovascular: Normal rate, regular rhythm, normal heart sounds and intact distal pulses. Exam reveals no gallop and no friction rub.  No murmur heard. Pulmonary/Chest: Breath sounds normal. No respiratory distress. He has no wheezes. He has no rales.  Abdominal: Soft. Bowel sounds are normal. He exhibits no mass. There is no hepatosplenomegaly. There is no tenderness. There is no rebound, no guarding and no CVA tenderness.  Musculoskeletal: Normal range of motion. He exhibits no edema.       Right knee: He exhibits normal range of motion and no swelling. No tenderness found.       Right lower leg: He exhibits tenderness and swelling.  15 inches/vs 13 3/4 inches r to l  Lymphadenopathy:    He has no cervical adenopathy.  Neurological: He is alert and oriented to person, place, and time. He has normal sensation, normal strength, normal reflexes and intact cranial nerves. No cranial nerve deficit.  Skin: Skin is warm. No rash noted.  Psychiatric: Mood and affect normal.  Nursing note and vitals reviewed.     Assessment & Plan  Problem List Items Addressed This Visit    None    Visit Diagnoses    DVT (deep vein thrombosis) in pregnancy [facility])    -  Primary   Relevant Orders   Ambulatory referral to Vascular Surgery   Calf swelling       Relevant Orders   US Venous Img Lower Unilateral Right      No orders of the defined types were placed in this encounter.     Dr. Macon [facility]  05/16/17

## 2017-05-17 ENCOUNTER — Encounter (INDEPENDENT_AMBULATORY_CARE_PROVIDER_SITE_OTHER): Payer: Self-pay | Admitting: Vascular Surgery

## 2017-05-17 ENCOUNTER — Encounter (INDEPENDENT_AMBULATORY_CARE_PROVIDER_SITE_OTHER): Payer: Self-pay

## 2017-05-17 ENCOUNTER — Ambulatory Visit (INDEPENDENT_AMBULATORY_CARE_PROVIDER_SITE_OTHER): Payer: Medicare Other | Admitting: Vascular Surgery

## 2017-05-17 VITALS — BP 153/88 | HR 76 | Resp 16 | Ht 69.5 in | Wt 178.6 lb

## 2017-05-17 DIAGNOSIS — I824Z1 Acute embolism and thrombosis of unspecified deep veins of right distal lower extremity: Secondary | ICD-10-CM

## 2017-05-17 MED ORDER — APIXABAN 5 MG PO TABS: 5.0000 mg | ORAL_TABLET | Freq: Two times a day (BID) | ORAL | 5 refills | Status: DC

## 2017-05-17 NOTE — Progress Notes (Signed)
Subjective:    Patient ID: Clayton [name], male    DOB: [birth year], 83 y.o.   MRN: [id number] Chief Complaint  Patient presents with  . New Patient (Initial Visit)    dvt evaluation   Presents as a new patient referred by Dr. Ronnald Ramp for evaluation of an acute lower extremity DVT.  The patient denies any history of recent trauma, surgery or immobility to the right lower extremity.  The patient notes in the abrupt onset of pain and swelling to the right leg which was severe enough to prompt him to seek medical attention with his primary care physician.  His physical exam yielded a workup for a DVT study.  A right lower extremity venous duplex on May 16, 2017 was notable for "Examination is positive for extensive predominantly occlusive right lower extremity DVT extending from the right common femoral vein, through the right femoral, popliteal and imaged portions of both paired posterior tibial and peroneal veins."  The patient was started on Eliquis 5 mg p.o. BID.  The patient denies any chest pain or shortness of breath.  The patient denies any discoloration to the right lower extremity.  Skin is intact to the right leg.  Patient denies any fever, nausea vomiting.  The patient is experiencing significant swelling and pain to the right lower extremity.    Review of Systems  Constitutional: Negative.   HENT: Negative.   Eyes: Negative.   Respiratory: Negative.   Cardiovascular: Positive for leg swelling.       RLE DVT  Gastrointestinal: Negative.   Endocrine: Negative.   Genitourinary: Negative.   Musculoskeletal: Negative.   Skin: Negative.   Allergic/Immunologic: Negative.   Neurological: Negative.   Hematological: Negative.   Psychiatric/Behavioral: Negative.       Objective:   Physical Exam  Constitutional: He is oriented to person, place, and time. He appears well-developed and well-nourished. No distress.  HENT:  Head: Normocephalic and atraumatic.  Eyes: Conjunctivae  are normal. Pupils are equal, round, and reactive to light.  Neck: Normal range of motion.  Cardiovascular: Normal rate, regular rhythm, normal heart sounds and intact distal pulses.  Pulses:      Radial pulses are 2+ on the right side, and 2+ on the left side.       Dorsalis pedis pulses are 2+ on the right side, and 2+ on the left side.       Posterior tibial pulses are 2+ on the right side, and 2+ on the left side.  Right lower extremity: Thigh is soft.  Calf soft.  There is pain with dorsiflexion.  There is moderate nonpitting edema noted to the right leg.  There is no skin discoloration.  Skin is intact.  There is no acute vascular compromise to the right leg.  Pulmonary/Chest: Effort normal and breath sounds normal.  Musculoskeletal: Normal range of motion. He exhibits edema.  Neurological: He is alert and oriented to person, place, and time.  Skin: Skin is warm and dry. He is not diaphoretic.  Psychiatric: He has a normal mood and affect. His behavior is normal. Judgment and thought content normal.  Vitals reviewed.  BP (!) 153/88 (BP Location: Right Arm)   Pulse 76   Resp 16   Ht 5' 9.5" (1.765 m)   Wt 178 lb 9.6 oz (81 kg)   BMI 26.00 kg/m   Past Medical History:  Diagnosis Date  . DVT (deep venous thrombosis) (HCC)    Social History   Socioeconomic History  .  Marital status: Married    Spouse name: Not on file  . Number of children: Not on file  . Years of education: Not on file  . Highest education level: Not on file  Social Needs  . Financial resource strain: Not on file  . Food insecurity - worry: Not on file  . Food insecurity - inability: Not on file  . Transportation needs - medical: Not on file  . Transportation needs - non-medical: Not on file  Occupational History  . Not on file  Tobacco Use  . Smoking status: Former Research scientist (life sciences)  . Smokeless tobacco: Never Used  Substance and Sexual Activity  . Alcohol use: Yes    Alcohol/week: 0.0 oz  . Drug use: No  .  Sexual activity: Not Currently  Other Topics Concern  . Not on file  Social History Narrative  . Not on file   Past Surgical History:  Procedure Laterality Date  . COLONOSCOPY  2014   cleared for 5 yrs  . CRANIECTOMY FOR DEPRESSED SKULL FRACTURE     Family History  Problem Relation Age of Onset  . Heart disease Father    Allergies  Allergen Reactions  . Sulfa Antibiotics       Assessment & Plan:  Presents as a new patient referred by Dr. Ronnald Ramp for evaluation of an acute lower extremity DVT.  The patient denies any history of recent trauma, surgery or immobility to the right lower extremity.  The patient notes in the abrupt onset of pain and swelling to the right leg which was severe enough to prompt him to seek medical attention with his primary care physician.  His physical exam yielded a workup for a DVT study.  A right lower extremity venous duplex on May 16, 2017 was notable for "Examination is positive for extensive predominantly occlusive right lower extremity DVT extending from the right common femoral vein, through the right femoral, popliteal and imaged portions of both paired posterior tibial and peroneal veins."  The patient was started on Eliquis 5 mg p.o. BID.  The patient denies any chest pain or shortness of breath.  The patient denies any discoloration to the right lower extremity.  Skin is intact to the right leg.  Patient denies any fever, nausea vomiting.  The patient is experiencing significant swelling and pain to the right lower extremity.   1. Acute deep vein thrombosis (DVT) of distal vein of right lower extremity (HCC) - Stable Patient presents with an extensive symptomatic right lower extremity DVT The patient has been started on Eliquis 5mg  PO twice daily.  He is to continue this medication.  Recommend a right lower extremity venous lysis due to the extent of the patient's DVT and his symptoms Procedure, risks and benefits explained to the patient All  questions answered The patient wishes to proceed If the patient experiences any shortness of breath or chest pain he is to immediately seek attention in the emergency department  No current outpatient medications on file prior to visit.   No current facility-administered medications on file prior to visit.    There are no Patient Instructions on file for this visit. No Follow-up on file.  KIMBERLY A STEGMAYER, PA-C

## 2017-05-18 ENCOUNTER — Other Ambulatory Visit (INDEPENDENT_AMBULATORY_CARE_PROVIDER_SITE_OTHER): Payer: Self-pay | Admitting: Vascular Surgery

## 2017-05-21 MED ORDER — CEFAZOLIN SODIUM-DEXTROSE 2-4 GM/100ML-% IV SOLN
2.0000 g | Freq: Once | INTRAVENOUS | Status: AC
  Administered 2017-05-22: 2 g via INTRAVENOUS

## 2017-05-22 ENCOUNTER — Ambulatory Visit
Admission: RE | Admit: 2017-05-22 | Discharge: 2017-05-22 | Disposition: A | Discharge status: Home / Self Care | Payer: Medicare Other | Source: Ambulatory Visit | Attending: Vascular Surgery | Admitting: Vascular Surgery

## 2017-05-22 ENCOUNTER — Encounter: Payer: Self-pay | Admitting: Emergency Medicine

## 2017-05-22 ENCOUNTER — Encounter
Admission: RE | Disposition: A | Discharge status: Home / Self Care | Payer: Self-pay | Source: Ambulatory Visit | Attending: Vascular Surgery

## 2017-05-22 DIAGNOSIS — I82409 Acute embolism and thrombosis of unspecified deep veins of unspecified lower extremity: Secondary | ICD-10-CM

## 2017-05-22 DIAGNOSIS — I82491 Acute embolism and thrombosis of other specified deep vein of right lower extremity: Secondary | ICD-10-CM | POA: Insufficient documentation

## 2017-05-22 DIAGNOSIS — Z8249 Family history of ischemic heart disease and other diseases of the circulatory system: Secondary | ICD-10-CM | POA: Diagnosis not present

## 2017-05-22 DIAGNOSIS — Z87891 Personal history of nicotine dependence: Secondary | ICD-10-CM | POA: Diagnosis not present

## 2017-05-22 DIAGNOSIS — I82401 Acute embolism and thrombosis of unspecified deep veins of right lower extremity: Secondary | ICD-10-CM | POA: Diagnosis not present

## 2017-05-22 DIAGNOSIS — Z9889 Other specified postprocedural states: Secondary | ICD-10-CM | POA: Insufficient documentation

## 2017-05-22 DIAGNOSIS — Z882 Allergy status to sulfonamides status: Secondary | ICD-10-CM | POA: Insufficient documentation

## 2017-05-22 HISTORY — PX: PERIPHERAL VASCULAR THROMBECTOMY: CATH118306

## 2017-05-22 LAB — BUN: BUN: 13 mg/dL (ref 6–20)

## 2017-05-22 LAB — CREATININE, SERUM
Creatinine, Ser: 1.18 mg/dL (ref 0.61–1.24)
GFR calc Af Amer: >60 mL/min (ref >60)
GFR calc non Af Amer: 56 mL/min — ABNORMAL LOW (ref >60)

## 2017-05-22 SURGERY — PERIPHERAL VASCULAR THROMBECTOMY
Anesthesia: Moderate Sedation | Laterality: Right

## 2017-05-22 MED ORDER — FAMOTIDINE 20 MG PO TABS: 40.0000 mg | ORAL_TABLET | ORAL | Status: DC | PRN

## 2017-05-22 MED ORDER — LIDOCAINE-EPINEPHRINE (PF) 1 %-1:200000 IJ SOLN
INTRAMUSCULAR | Status: DC | PRN
  Administered 2017-05-22: 10 mL via INTRADERMAL

## 2017-05-22 MED ORDER — METHYLPREDNISOLONE SODIUM SUCC 125 MG IJ SOLR: 125.0000 mg | INTRAMUSCULAR | Status: DC | PRN

## 2017-05-22 MED ORDER — IOPAMIDOL (ISOVUE-300) INJECTION 61%
INTRAVENOUS | Status: DC | PRN
  Administered 2017-05-22: 60 mL via INTRAVENOUS

## 2017-05-22 MED ORDER — ONDANSETRON HCL 4 MG/2ML IJ SOLN: 4.0000 mg | Freq: Four times a day (QID) | INTRAMUSCULAR | Status: DC | PRN

## 2017-05-22 MED ORDER — LIDOCAINE-EPINEPHRINE (PF) 1 %-1:200000 IJ SOLN
INTRAMUSCULAR | Status: AC
  Filled 2017-05-22: qty 30

## 2017-05-22 MED ORDER — FENTANYL CITRATE (PF) 100 MCG/2ML IJ SOLN
INTRAMUSCULAR | Status: AC
  Filled 2017-05-22: qty 4

## 2017-05-22 MED ORDER — HEPARIN SODIUM (PORCINE) 1000 UNIT/ML IJ SOLN
INTRAMUSCULAR | Status: DC | PRN
  Administered 2017-05-22: 4000 [IU] via INTRAVENOUS

## 2017-05-22 MED ORDER — HYDROMORPHONE HCL 1 MG/ML IJ SOLN: 1.0000 mg | Freq: Once | INTRAMUSCULAR | Status: DC | PRN

## 2017-05-22 MED ORDER — ALTEPLASE 2 MG IJ SOLR
INTRAMUSCULAR | Status: DC | PRN
  Administered 2017-05-22: 8 mg

## 2017-05-22 MED ORDER — FENTANYL CITRATE (PF) 100 MCG/2ML IJ SOLN
INTRAMUSCULAR | Status: DC | PRN
  Administered 2017-05-22 (×3): 25 ug via INTRAVENOUS
  Administered 2017-05-22: 50 ug via INTRAVENOUS

## 2017-05-22 MED ORDER — MIDAZOLAM HCL 5 MG/5ML IJ SOLN
INTRAMUSCULAR | Status: AC
  Filled 2017-05-22: qty 10

## 2017-05-22 MED ORDER — SODIUM CHLORIDE 0.9 % IV SOLN
INTRAVENOUS | Status: DC
  Administered 2017-05-22: 12:00:00 via INTRAVENOUS

## 2017-05-22 MED ORDER — CEFAZOLIN SODIUM-DEXTROSE 2-4 GM/100ML-% IV SOLN
INTRAVENOUS | Status: AC
  Filled 2017-05-22: qty 100

## 2017-05-22 MED ORDER — HEPARIN SODIUM (PORCINE) 1000 UNIT/ML IJ SOLN
INTRAMUSCULAR | Status: AC
  Filled 2017-05-22: qty 1

## 2017-05-22 MED ORDER — ALTEPLASE 2 MG IJ SOLR
INTRAMUSCULAR | Status: AC
  Filled 2017-05-22: qty 8

## 2017-05-22 MED ORDER — HEPARIN (PORCINE) IN NACL 2-0.9 UNIT/ML-% IJ SOLN
INTRAMUSCULAR | Status: AC
  Filled 2017-05-22: qty 1000

## 2017-05-22 MED ORDER — MIDAZOLAM HCL 2 MG/2ML IJ SOLN
INTRAMUSCULAR | Status: DC | PRN
  Administered 2017-05-22 (×7): 1 mg via INTRAVENOUS

## 2017-05-22 SURGICAL SUPPLY — 17 items
BALLN MUSTANG 8X20X75 (BALLOONS) ×2
BALLN ULTRVRSE 8X200X130 (BALLOONS) ×2
BALLOON MUSTANG 8X20X75 (BALLOONS) ×1 IMPLANT
BALLOON ULTRVRSE 8X200X130 (BALLOONS) ×1 IMPLANT
CANISTER PENUMBRA MAX (MISCELLANEOUS) ×2 IMPLANT
CANNULA 5F STIFF (CANNULA) ×4 IMPLANT
CATH BEACON 5 .035 65 KMP TIP (CATHETERS) ×2 IMPLANT
CATH INDIGO D 50CM (CATHETERS) ×2 IMPLANT
DEVICE PRESTO INFLATION (MISCELLANEOUS) ×2 IMPLANT
DEVICE SAFEGUARD 24CM (GAUZE/BANDAGES/DRESSINGS) ×2 IMPLANT
FILTER VC CELECT-FEMORAL (Filter) ×2 IMPLANT
PACK ANGIOGRAPHY (CUSTOM PROCEDURE TRAY) ×2 IMPLANT
SHEATH BRITE TIP 8FRX11 (SHEATH) ×2 IMPLANT
SYR MEDRAD MARK V 150ML (SYRINGE) ×2 IMPLANT
TUBING CONTRAST HIGH PRESS 72 (TUBING) ×2 IMPLANT
WIRE J 3MM .035X145CM (WIRE) ×2 IMPLANT
WIRE MAGIC TOR.035 180C (WIRE) ×2 IMPLANT

## 2017-05-22 NOTE — Op Note (Signed)
[facility]   OPERATIVE NOTE   PRE-OPERATIVE DIAGNOSIS: extensive right lower extremity DVT  POST-OPERATIVE DIAGNOSIS: same   PROCEDURE: 1. US guidance for vascular access to left femoral vein and right popliteal vein 2. Catheter placement into IVC from left femoral vein approach and catheter placement into right external iliac vein from right popliteal approach 3. IVC gram and right lower extremity venogram 4. IVC filter placement 5.   Catheter directed thrombolysis with 8 mg of TPA to the right popliteal, superficial femoral, and common femoral veins 6. Mechanical thrombectomy to right popliteal, superficial femoral, and common femoral veins with the penumbra cat D device 7. PTA of right popliteal, superficial femoral, and common femoral veins with 2 inflations with 8 mm by 20 cm angioplasty balloon    SURGEON: Clayton Pain, MD  ASSISTANT(S): none  ANESTHESIA: local with moderate conscious sedation for 50 minutes using 7 mg of Versed and 125 Mcg of Fentanyl  ESTIMATED BLOOD LOSS: 300 cc  FINDING(S): 1. Extensive thrombus from the right popliteal vein through the superficial femoral vein up into the common femoral vein with no significant thrombus within the iliac venous system or inferior vena cava.  Markedly improved with intervention  SPECIMEN(S): none  INDICATIONS:  Patient is a 83 y.o. male who presents with extensive right lower extremity DVT with significant symptoms. Patient has marked leg swelling and [name]. Venous intervention is performed to reduce the symtpoms and avoid long term postphlebitic symptoms.   DESCRIPTION: After obtaining full informed written consent, the patient was brought back to the vascular suite and placed supine upon the table.Moderate conscious sedation was administered during a face to face encounter with the patient throughout the procedure with my supervision of the RN administering medicines and  monitoring the patient's vital signs, pulse oximetry, telemetry and mental status throughout from the start of the procedure until the patient was taken to the recovery room. After obtaining adequate anesthesia, the patient was prepped and draped in the standard fashion. The left common femoral vein was then accessed under US guidance and found to be widely patent. It was accessed without difficulty and a permanent image was recorded. I then placed the delivery sheath into the IVC. The IVC was patent and the renal veins were at the level of L1. The retrievable IVC filter was then deployed at the level of top of L2. The delivery sheath was then removed and dressings were placed in the left groin.  The patient was then placed into the prone position. The right popliteal vein was then accessed under direct ultrasound guidance without difficulty with a micropuncture needle and a permanent image was recorded. I then upsized to an 8Fr sheath over a J wire. 4000 units of heparin were then given. Imaging showed extensive DVT with minimal flow from the right popliteal vein up through the superficial femoral vein and to the common femoral vein. A Kumpe catheter and Magic tourque wire were then advanced into the CFV and into the distal external iliac artery and images were performed. No significant thrombosis of the right iliac system or the inferior vena cava was identified. I was able to cross the thrombus and stenosis and advance into the IVC which was patent. I then used the Kumpe catheter and instilled 8 mg of tpa throughout the right common femoral vein, superficial femoral vein, and popliteal veins.  Then, I used the Penumbra Cat D catheter and evacuated about 100 cc of effluent with mechanical thrombectomy throughout the CFV,  SFV, and tibial vein. This had mild improvement. I then treated the popliteal vein, SFV, and CFV with 8 mm diameter angioplasty balloon to open a channel.  This was a 20 cm  long catheter and 2 inflations were treat from the common femoral vein down to the popliteal vein.  Both inflations were 6 atm for 1 minute.  This resulted in some resolution of the thrombus and improved flow. I then made 2 more passes with the penumbra cat D device evacuating about another 200 cc of effluent with marked resolution in the thrombus.  At this point, the common femoral vein did not have any significant residual thrombus.  The superficial femoral vein had several areas of non-flow-limiting thrombus with marked improvement in the flow channel.  There was mild to moderate residual thrombus in the popliteal vein with some limitation, although that was difficult to discern with an 8 [place] sheath in place.  I felt we had markedly improved his situation and dramatically debulked the thrombus at this point.  I then elected to terminate the procedure. The sheath was removed and a dressing was placed. She was taken to the recovery room in stable condition having tolerated the procedure well.   COMPLICATIONS: None  CONDITION: Stable  Clayton [name] 05/22/2017 3:35 PM

## 2017-05-22 NOTE — H&P (Signed)
[facility] History & Physical Update  The patient was interviewed and re-examined.  The patient's previous History and Physical has been reviewed and is unchanged.  There is no change in the plan of care. We plan to proceed with the scheduled procedure.  Leotis Pain, MD  05/22/2017, 12:32 PM

## 2017-05-23 ENCOUNTER — Encounter: Payer: Self-pay | Admitting: Vascular Surgery

## 2017-05-26 ENCOUNTER — Ambulatory Visit: Payer: Medicare Other | Admitting: Family Medicine

## 2017-05-26 ENCOUNTER — Encounter: Payer: Self-pay | Admitting: Family Medicine

## 2017-05-26 VITALS — BP 138/70 | HR 80

## 2017-05-26 DIAGNOSIS — R001 Bradycardia, unspecified: Secondary | ICD-10-CM | POA: Diagnosis not present

## 2017-05-26 DIAGNOSIS — I441 Atrioventricular block, second degree: Secondary | ICD-10-CM | POA: Diagnosis not present

## 2017-05-26 NOTE — Patient Instructions (Signed)
Second-Degree Atrioventricular Block  What is atrioventricular (AV) block?  Atrioventricular (AV) block, also known as heart block, is a problem with the system that controls how often the heart beats (heart rate) and the pattern of heart beats (heart rhythm). In this condition, the signals that travel from the heart's upper chambers (atria) to its lower chambers (ventricles) move too slowly or are interrupted. There are several types of heart block:   First-degree.   Second-degree.   Third-degree or complete.    What is second-degree heart block?    Second-degree AV block is a type of heart block that can cause the heart to miss beats. In this condition, the signals that control heart rate move too slowly or are interrupted. There are two types of second-degree AV blocks:   Mobitz type 1 block. This type usually does not cause symptoms, and it rarely requires treatment.   Mobitz type 2 block. This type is more serious. It results in more missed beats and a very irregular heartbeat. It can lead to complete heart block, which is a dangerous condition.    What are the causes?  This condition may be caused by:   Any condition that damages the system that controls the heart's rate and rhythm, such as a heart attack.   Overstimulation of the nerve that slows down heart rate (vagus nerve). This cause is common among well-conditioned athletes.   Some medicinesthat slow down the heart rate, such as beta blockers or calcium channel blockers.   Surgery that damages the heart.    Some people are born with this condition (congenital heart block), but most people develop it over time.  What increases the risk?  The risk for this condition increases with age.You are also more likely to develop this condition if you have:   A history of heart attack.   Heart failure.   Coronary heart disease.   Inflammation of heart muscle (myocarditis).   Disease of heart muscle (cardiomyopathy).   Infection of the heart valves  (endocarditis).   Infections or diseases that affect the heart. These include:  ? Lyme disease.  ? Sarcoidosis.  ? Hemochromatosis.  ? Rheumatic fever.  ? Muscle disorders including Lev disease and Lenegre disease.    Babies are more likely to be born with heart block if:   The mother has an autoimmune disease, such as lupus.   The baby is born with a heart defect that affects the heart's structure.   A parent was born with a heart defect.    What are the signs or symptoms?  Usually, a type 1 block does not cause symptoms. A type 2 block may cause:   Tiredness.   Shortness of breath.   Dizziness.   Light-headedness.   Fainting.   Chest pain.    How is this diagnosed?  This condition may be diagnosed based on:   A physical exam.   Your medical history.   A measurement of your pulse or heartbeat.   Tests. These may include:  ? An electrocardiogram (ECG). This test is done to check for problems with electrical activity in the heart.  ? A Holter monitor or event monitor test. This test involves wearing a portable device that monitors heart rate over time.  ? An electrophysiology (EP) study. This test involves having long, thin tubes (catheters) placed in the heart. The catheters are used to study the heart and record electrical signals in the heart.    How is this treated?    Treatment for this condition depends on the type of block you have and how bad it is. Treatment may involve:   Treating an underlying condition, such as heart disease.   Changing or stopping any heart medicines that can cause heart block.   Having a permanent pacemaker placed in the chest. A pacemaker is a small device that uses electrical pulses to help the heart beat normally. It is usually placed under the skin on your chest or abdomen. You may need a pacemaker if you have a type 2 block.    Follow these instructions at home:  General instructions   Take over-the-counter and prescription medicines only as told by your health care  provider.   Work with your health care providerto control lifestyle choices that increase your risk for heart disease. You may need to:  ? Get regular exercise. Each week, try to get 150 minutes of moderate-intensity activity (such as walking or yoga) or 75 minutes of vigorous activity (such as running or swimming). Ask your health care provider what type of exercise is safe for you.  ? Eat a heart-healthy diet with fruits and vegetables, whole grains, low-fat dairy products, and lean proteins like poultry and eggs. Your health care provider or diet and nutrition specialist (dietitian) can help you make healthy choices.  ? Maintain a healthy weight.  ? Limit alcohol intake to no more than 1 drink per day for nonpregnant women and 2 drinks per day for men. One drink equals 12 oz of beer, 5 oz of wine, or 1 oz of hard liquor.   Do not use any products that contain nicotine or tobacco, such as cigarettes and e-cigarettes. If you need help quitting, ask your health care provider.   Keep all follow-up visits as told by your health care provider. This is important.  If you have a pacemaker:   Avoid spending long periods of time with electronic devices that have strong magnetic fields, such as cell phones, microwaves, or metal detectors.   Tell all health care providers who care for you that you have a pacemaker.   If you are flying, tell airport security that you have a pacemaker.   Consider wearing a medical ID bracelet or necklace stating that you have a pacemaker.   You will be given a card with information about your pacemaker. Carry this card with you at all times.  Contact a health care provider if:   You feel likeyour heart is skipping beats.   You feel more tired than normal.   You have swelling in your lower legs or your feet.  Get help right away if:   Your symptoms change or they get worse.   You develop new symptoms.   You have chest pain, especially if the pain:  ? Feels like crushing or  pressure.  ? Spreads to your arms, back, neck, or jaw.   You feel short of breath.   You feel light-headed or weak.   You faint.  Summary   Second-degree AV block is a type of heart block that can cause the heart to miss beats. In this condition, the signals that control heart rate move too slowly or are interrupted.   You may need a pacemaker if you have a type 2 block.   Get help right away if you have chest pain, or if you faint or develop new symptoms.  This information is not intended to replace advice given to you by your health care provider. Make

## 2017-05-26 NOTE — Progress Notes (Signed)
Name: Clayton [name]   MRN: [id number]    DOB: Jan 15, 1935   Date:05/26/2017       Progress Note  Subjective  Chief Complaint  No chief complaint on file.   Heart Problem  This is a new problem. The current episode started in the past 7 days. Episode frequency: uncertain. The problem has been unchanged. Pertinent negatives include no abdominal pain, anorexia, arthralgias, change in bowel habit, chest pain, chills, congestion, coughing, diaphoresis, fatigue, fever, headaches, joint swelling, myalgias, nausea, neck pain, numbness, rash, sore throat, swollen glands, urinary symptoms, vertigo, visual change, vomiting or weakness. Nothing aggravates the symptoms.    No problem-specific Assessment & Plan notes found for this encounter.   Past Medical History:  Diagnosis Date  . DVT (deep venous thrombosis) ([place])     Past Surgical History:  Procedure Laterality Date  . COLONOSCOPY  2014   cleared for 5 yrs  . CRANIECTOMY FOR DEPRESSED SKULL FRACTURE    . PERIPHERAL VASCULAR THROMBECTOMY Right 05/22/2017   Procedure: PERIPHERAL VASCULAR THROMBECTOMY;  Surgeon: Algernon Huxley, MD;  Location: [facility];  Service: Cardiovascular;  Laterality: Right;    Family History  Problem Relation Age of Onset  . Heart disease Father     Social History   Socioeconomic History  . Marital status: Married    Spouse name: Not on file  . Number of children: Not on file  . Years of education: Not on file  . Highest education level: Not on file  Social Needs  . Financial resource strain: Not on file  . Food insecurity - worry: Not on file  . Food insecurity - inability: Not on file  . Transportation needs - medical: Not on file  . Transportation needs - non-medical: Not on file  Occupational History  . Not on file  Tobacco Use  . Smoking status: Former Research scientist (life sciences)  . Smokeless tobacco: Never Used  Substance and Sexual Activity  . Alcohol use: Yes    Alcohol/week: 0.0 oz  . Drug use:  No  . Sexual activity: Not Currently  Other Topics Concern  . Not on file  Social History Narrative  . Not on file    No Known Allergies  Outpatient Medications Prior to Visit  Medication Sig Dispense Refill  . apixaban (ELIQUIS) 5 MG TABS tablet Take 1 tablet (5 mg total) by mouth 2 (two) times daily. 60 tablet 5   No facility-administered medications prior to visit.     Review of Systems  Constitutional: Negative for chills, diaphoresis, fatigue, fever, malaise/fatigue and weight loss.  HENT: Negative for congestion, ear discharge, ear pain and sore throat.   Eyes: Negative for blurred vision.  Respiratory: Negative for cough, sputum production, shortness of breath and wheezing.   Cardiovascular: Negative for chest pain, palpitations and leg swelling.  Gastrointestinal: Negative for abdominal pain, anorexia, blood in stool, change in bowel habit, constipation, diarrhea, heartburn, melena, nausea and vomiting.  Genitourinary: Negative for dysuria, frequency, hematuria and urgency.  Musculoskeletal: Negative for arthralgias, back pain, joint pain, joint swelling, myalgias and neck pain.  Skin: Negative for rash.  Neurological: Negative for dizziness, vertigo, tingling, sensory change, focal weakness, weakness, numbness and headaches.  Endo/Heme/Allergies: Negative for environmental allergies and polydipsia. Does not bruise/bleed easily.  Psychiatric/Behavioral: Negative for depression and suicidal ideas. The patient is not nervous/anxious and does not have insomnia.      Objective  Vitals:   05/26/17 1455  BP: 138/70  Pulse: 80  Physical Exam  Constitutional: He is oriented to person, place, and time and well-developed, well-nourished, and in no distress.  HENT:  Head: Normocephalic.  Right Ear: External ear normal.  Left Ear: External ear normal.  Nose: Nose normal.  Mouth/Throat: Oropharynx is clear and moist.  Eyes: Conjunctivae and EOM are normal. Pupils are  equal, round, and reactive to light. Right eye exhibits no discharge. Left eye exhibits no discharge. No scleral icterus.  Neck: Normal range of motion. Neck supple. No JVD present. No tracheal deviation present. No thyromegaly present.  Cardiovascular: Normal rate, regular rhythm, normal heart sounds and intact distal pulses. Exam reveals no gallop and no friction rub.  No murmur heard. Pulmonary/Chest: Breath sounds normal. No respiratory distress. He has no wheezes. He has no rales.  Abdominal: Soft. Bowel sounds are normal. He exhibits no mass. There is no hepatosplenomegaly. There is no tenderness. There is no rebound, no guarding and no CVA tenderness.  Musculoskeletal: Normal range of motion. He exhibits no edema or tenderness.  Lymphadenopathy:    He has no cervical adenopathy.  Neurological: He is alert and oriented to person, place, and time. He has normal sensation, normal strength, normal reflexes and intact cranial nerves. No cranial nerve deficit.  Skin: Skin is warm. No rash noted.  Psychiatric: Mood and affect normal.  Nursing note and vitals reviewed.     Assessment & Plan  Problem List Items Addressed This Visit    None    Visit Diagnoses    Mobitz type 2 second degree atrioventricular block    -  Primary   Relevant Orders   EKG 12-Lead (Completed)   Ambulatory referral to Cardiology   Bradycardia       Relevant Orders   EKG 12-Lead (Completed)      No orders of the defined types were placed in this encounter.     Dr. Macon [facility]  05/26/17

## 2017-06-06 DIAGNOSIS — R001 Bradycardia, unspecified: Secondary | ICD-10-CM | POA: Diagnosis not present

## 2017-06-06 DIAGNOSIS — I82531 Chronic embolism and thrombosis of right popliteal vein: Secondary | ICD-10-CM | POA: Diagnosis not present

## 2017-06-06 DIAGNOSIS — R0602 Shortness of breath: Secondary | ICD-10-CM | POA: Diagnosis not present

## 2017-06-09 ENCOUNTER — Other Ambulatory Visit (INDEPENDENT_AMBULATORY_CARE_PROVIDER_SITE_OTHER): Payer: Self-pay | Admitting: Vascular Surgery

## 2017-06-09 DIAGNOSIS — I82441 Acute embolism and thrombosis of right tibial vein: Secondary | ICD-10-CM

## 2017-06-12 DIAGNOSIS — R001 Bradycardia, unspecified: Secondary | ICD-10-CM | POA: Diagnosis not present

## 2017-06-14 ENCOUNTER — Ambulatory Visit (INDEPENDENT_AMBULATORY_CARE_PROVIDER_SITE_OTHER): Payer: Medicare Other | Admitting: Vascular Surgery

## 2017-06-14 ENCOUNTER — Ambulatory Visit (INDEPENDENT_AMBULATORY_CARE_PROVIDER_SITE_OTHER): Payer: Medicare Other

## 2017-06-14 ENCOUNTER — Encounter (INDEPENDENT_AMBULATORY_CARE_PROVIDER_SITE_OTHER): Payer: Self-pay | Admitting: Vascular Surgery

## 2017-06-14 VITALS — BP 165/86 | HR 62 | Resp 13 | Ht 68.5 in | Wt 177.0 lb

## 2017-06-14 DIAGNOSIS — I82441 Acute embolism and thrombosis of right tibial vein: Secondary | ICD-10-CM

## 2017-06-14 DIAGNOSIS — I824Y1 Acute embolism and thrombosis of unspecified deep veins of right proximal lower extremity: Secondary | ICD-10-CM

## 2017-06-14 NOTE — Progress Notes (Signed)
Subjective:    Patient ID: Clayton [name], male    DOB: December 31, 1934, 83 y.o.   MRN: [id number] Chief Complaint  Patient presents with  . Follow-up    Follow up DVT check   Patient presents for his first post procedure follow-up.  The patient underwent a right lower extremity venous lysis with IVC filter placement on May 22, 2017.  The patient's postprocedure course has been unremarkable.  The patient presents today without any right lower extremity symptoms.  With that, the patient has been wearing medical grade 1 compression stockings and elevating his lower extremity.  The patient notes that his discomfort and edema have resolved.  The patient continues to take Eliquis 5 mg 1 tab by mouth twice daily.  The patient denies any shortness of breath or chest pain.  The patient denies any fever, nausea vomiting.  The patient underwent a right lower extremity DVT study which was notable for continued obstruction in the femoral vein, popliteal vein, posterior tibial veins and peroneal veins.  Age indeterminate.  There is no evidence of superficial thrombophlebitis of the right lower extremity.  There is no new acute findings on duplex today.   Review of Systems  Constitutional: Negative.   HENT: Negative.   Eyes: Negative.   Respiratory: Negative.   Cardiovascular:       DVT  Gastrointestinal: Negative.   Endocrine: Negative.   Genitourinary: Negative.   Musculoskeletal: Negative.   Skin: Negative.   Allergic/Immunologic: Negative.   Neurological: Negative.   Hematological: Negative.   Psychiatric/Behavioral: Negative.       Objective:   Physical Exam  Constitutional: He is oriented to person, place, and time. He appears well-developed and well-nourished. No distress.  HENT:  Head: Normocephalic and atraumatic.  Eyes: Conjunctivae are normal. Pupils are equal, round, and reactive to light.  Neck: Normal range of motion.  Cardiovascular: Normal rate, regular rhythm, normal  heart sounds and intact distal pulses.  Pulses:      Radial pulses are 2+ on the right side, and 2+ on the left side.       Dorsalis pedis pulses are 2+ on the right side, and 2+ on the left side.       Posterior tibial pulses are 2+ on the right side, and 2+ on the left side.  Right lower extremity: Thigh is soft.  Calf is soft.  There is no pain with dorsiflexion.  There is no pain with palpation.  Pulmonary/Chest: Effort normal and breath sounds normal.  Musculoskeletal: Normal range of motion. He exhibits no edema.  Neurological: He is alert and oriented to person, place, and time.  Skin: Skin is warm and dry. He is not diaphoretic.  Psychiatric: He has a normal mood and affect. His behavior is normal. Judgment and thought content normal.  Vitals reviewed.  BP (!) 165/86 (BP Location: Right Arm, Patient Position: Sitting)   Pulse 62   Resp 13   Ht 5' 8.5" (1.74 m)   Wt 177 lb (80.3 kg)   BMI 26.52 kg/m   Past Medical History:  Diagnosis Date  . DVT (deep venous thrombosis) (HCC)    Social History   Socioeconomic History  . Marital status: Married    Spouse name: Not on file  . Number of children: Not on file  . Years of education: Not on file  . Highest education level: Not on file  Social Needs  . Financial resource strain: Not on file  . Food insecurity -  worry: Not on file  . Food insecurity - inability: Not on file  . Transportation needs - medical: Not on file  . Transportation needs - non-medical: Not on file  Occupational History  . Not on file  Tobacco Use  . Smoking status: Former Research scientist (life sciences)  . Smokeless tobacco: Never Used  Substance and Sexual Activity  . Alcohol use: Yes    Alcohol/week: 0.0 oz  . Drug use: No  . Sexual activity: Not Currently  Other Topics Concern  . Not on file  Social History Narrative  . Not on file   Past Surgical History:  Procedure Laterality Date  . COLONOSCOPY  2014   cleared for 5 yrs  . CRANIECTOMY FOR DEPRESSED SKULL  FRACTURE    . PERIPHERAL VASCULAR THROMBECTOMY Right 05/22/2017   Procedure: PERIPHERAL VASCULAR THROMBECTOMY;  Surgeon: Algernon Huxley, MD;  Location: [facility];  Service: Cardiovascular;  Laterality: Right;   Family History  Problem Relation Age of Onset  . Heart disease Father    Allergies  Allergen Reactions  . Aspirin Rash      Assessment & Plan:  Patient presents for his first post procedure follow-up.  The patient underwent a right lower extremity venous lysis with IVC filter placement on May 22, 2017.  The patient's postprocedure course has been unremarkable.  The patient presents today without any right lower extremity symptoms.  With that, the patient has been wearing medical grade 1 compression stockings and elevating his lower extremity.  The patient notes that his discomfort and edema have resolved.  The patient continues to take Eliquis 5 mg 1 tab by mouth twice daily.  The patient denies any shortness of breath or chest pain.  The patient denies any fever, nausea vomiting.  The patient underwent a right lower extremity DVT study which was notable for continued obstruction in the femoral vein, popliteal vein, posterior tibial veins and peroneal veins.  Age indeterminate.  There is no evidence of superficial thrombophlebitis of the right lower extremity.  There is no new acute findings on duplex today.  1. Deep vein thrombosis (DVT) of proximal vein of right lower extremity, unspecified chronicity (HCC) - Stable Patient presents for his first post procedure follow-up status post a right lower extremity IVC filter placement and venous lysis for an extensive DVT The patient presents today without symptom Physical exam is unremarkable We will plan on removing the patient's IVC filter in approximately 2-3 weeks Procedure, risks and benefits explained to the patient. Questions answered The patient wishes to proceed The patient should continue taking Eliquis 5 mg 1 tab by  mouth twice daily I will bring the patient back in 3 months for repeat right lower extremity DVT study The patient is to continue wearing medical grade 1 compression stockings and elevating his legs The patient expresses understanding  - VAS [place] LOWER EXTREMITY VENOUS (DVT); Future  Current Outpatient Medications on File Prior to Visit  Medication Sig Dispense Refill  . apixaban (ELIQUIS) 5 MG TABS tablet Take 1 tablet (5 mg total) by mouth 2 (two) times daily. 60 tablet 5   No current facility-administered medications on file prior to visit.    There are no Patient Instructions on file for this visit. No Follow-up on file.  Hilde Churchman A Kayelyn Lemon, PA-C

## 2017-06-15 ENCOUNTER — Encounter (INDEPENDENT_AMBULATORY_CARE_PROVIDER_SITE_OTHER): Payer: Self-pay

## 2017-06-16 DIAGNOSIS — I441 Atrioventricular block, second degree: Secondary | ICD-10-CM | POA: Diagnosis not present

## 2017-06-16 DIAGNOSIS — R0602 Shortness of breath: Secondary | ICD-10-CM | POA: Diagnosis not present

## 2017-06-16 DIAGNOSIS — R001 Bradycardia, unspecified: Secondary | ICD-10-CM | POA: Diagnosis not present

## 2017-06-26 ENCOUNTER — Other Ambulatory Visit (INDEPENDENT_AMBULATORY_CARE_PROVIDER_SITE_OTHER): Payer: Self-pay | Admitting: Vascular Surgery

## 2017-07-02 MED ORDER — CEFAZOLIN SODIUM-DEXTROSE 2-4 GM/100ML-% IV SOLN: 2.0000 g | Freq: Once | INTRAVENOUS | Status: DC

## 2017-07-03 ENCOUNTER — Encounter
Admission: RE | Disposition: A | Discharge status: Home / Self Care | Payer: Self-pay | Source: Ambulatory Visit | Attending: Vascular Surgery

## 2017-07-03 ENCOUNTER — Ambulatory Visit
Admission: RE | Admit: 2017-07-03 | Discharge: 2017-07-03 | Disposition: A | Discharge status: Home / Self Care | Payer: Medicare Other | Source: Ambulatory Visit | Attending: Vascular Surgery | Admitting: Vascular Surgery

## 2017-07-03 DIAGNOSIS — I824Y1 Acute embolism and thrombosis of unspecified deep veins of right proximal lower extremity: Secondary | ICD-10-CM | POA: Insufficient documentation

## 2017-07-03 DIAGNOSIS — Z8249 Family history of ischemic heart disease and other diseases of the circulatory system: Secondary | ICD-10-CM | POA: Diagnosis not present

## 2017-07-03 DIAGNOSIS — Z87891 Personal history of nicotine dependence: Secondary | ICD-10-CM | POA: Insufficient documentation

## 2017-07-03 DIAGNOSIS — Z7902 Long term (current) use of antithrombotics/antiplatelets: Secondary | ICD-10-CM | POA: Insufficient documentation

## 2017-07-03 DIAGNOSIS — Z886 Allergy status to analgesic agent status: Secondary | ICD-10-CM | POA: Insufficient documentation

## 2017-07-03 DIAGNOSIS — I82499 Acute embolism and thrombosis of other specified deep vein of unspecified lower extremity: Secondary | ICD-10-CM

## 2017-07-03 DIAGNOSIS — Z9889 Other specified postprocedural states: Secondary | ICD-10-CM | POA: Insufficient documentation

## 2017-07-03 DIAGNOSIS — Z452 Encounter for adjustment and management of vascular access device: Secondary | ICD-10-CM | POA: Insufficient documentation

## 2017-07-03 HISTORY — PX: IVC FILTER REMOVAL: CATH118246

## 2017-07-03 SURGERY — IVC FILTER REMOVAL
Anesthesia: Moderate Sedation

## 2017-07-03 MED ORDER — ONDANSETRON HCL 4 MG/2ML IJ SOLN: 4.0000 mg | Freq: Four times a day (QID) | INTRAMUSCULAR | Status: DC | PRN

## 2017-07-03 MED ORDER — HYDROMORPHONE HCL 1 MG/ML IJ SOLN: 1.0000 mg | Freq: Once | INTRAMUSCULAR | Status: DC | PRN

## 2017-07-03 MED ORDER — LIDOCAINE-EPINEPHRINE (PF) 1 %-1:200000 IJ SOLN
INTRAMUSCULAR | Status: AC
  Filled 2017-07-03: qty 30

## 2017-07-03 MED ORDER — MIDAZOLAM HCL 5 MG/5ML IJ SOLN
INTRAMUSCULAR | Status: AC
  Filled 2017-07-03: qty 5

## 2017-07-03 MED ORDER — FENTANYL CITRATE (PF) 100 MCG/2ML IJ SOLN
INTRAMUSCULAR | Status: AC
  Filled 2017-07-03: qty 2

## 2017-07-03 MED ORDER — SODIUM CHLORIDE 0.9 % IV SOLN: INTRAVENOUS | Status: DC

## 2017-07-03 MED ORDER — HEPARIN (PORCINE) IN NACL 2-0.9 UNIT/ML-% IJ SOLN
INTRAMUSCULAR | Status: AC
  Filled 2017-07-03: qty 500

## 2017-07-03 MED ORDER — MIDAZOLAM HCL 2 MG/2ML IJ SOLN
INTRAMUSCULAR | Status: DC | PRN
  Administered 2017-07-03: 2 mg via INTRAVENOUS

## 2017-07-03 MED ORDER — FENTANYL CITRATE (PF) 100 MCG/2ML IJ SOLN
INTRAMUSCULAR | Status: DC | PRN
  Administered 2017-07-03: 50 ug via INTRAVENOUS

## 2017-07-03 SURGICAL SUPPLY — 3 items
PACK ANGIOGRAPHY (CUSTOM PROCEDURE TRAY) ×2 IMPLANT
SET VENACAVA FILTER RETRIEVAL (MISCELLANEOUS) ×2 IMPLANT
WIRE J 3MM .035X145CM (WIRE) ×2 IMPLANT

## 2017-07-03 NOTE — H&P (Signed)
 [facility] History & Physical Update  The patient was interviewed and re-examined.  The patient's previous History and Physical has been reviewed and is unchanged.  There is no change in the plan of care. We plan to proceed with the scheduled procedure.  Leotis Pain, MD  07/03/2017, 8:14 AM

## 2017-07-03 NOTE — Op Note (Signed)
[facility]   OPERATIVE NOTE    PRE-OPERATIVE DIAGNOSIS:  1. DVT 2. status post IVC filter placement  POST-OPERATIVE DIAGNOSIS: Same as above  PROCEDURE: 1. Ultrasound guidance for vascular access right jugular vein 2. Catheter placement into inferior vena cava from right jugular vein 3. Inferior venacavogram 4. Retrieval of Cook Celect IVC filter  SURGEON: Leotis Pain, MD  ASSISTANT(S): None  ANESTHESIA: Local with moderate conscious sedation for approximately 15 minutes using 2 mg of Versed and 50 mcg of Fentanyl  ESTIMATED BLOOD LOSS: 3 cc  CONTRAST:  15 cc  FLUORO TIME:  0.4 minutes  FINDING(S): 1. patent IVC, filter without thrombosis  SPECIMEN(S): IVC filter  INDICATIONS:  Patient is a 83 y.o. male who presents with a previous history of IVC filter placement. Patient has a DVT which was treated with percutaneous thrombectomy with filter placement at that time.  He has tolerated anticoagulation and no longer needs this filter. The patient remains on anticoagulation. Risks and benefits were discussed, and informed consent was obtained.  DESCRIPTION: After obtaining full informed written consent, the patient was brought back to the vascular suite and placed supine upon the table.Moderate conscious sedation was administered during a face to face encounter with the patient throughout the procedure with my supervision of the RN administering medicines and monitoring the patient's vital signs, pulse oximetry, telemetry and mental status throughout from the start of the procedure until the patient was taken to the recovery room.  After obtaining adequate anesthesia, the patient was prepped and draped in the standard fashion. The right jugular vein was visualized with ultrasound and found to be widely patent. It was then accessed under direct ultrasound guidance without difficulty with the Seldinger needle and a permanent image was recorded. A  J-wire was placed. After skin nick and dilatation, the retrieval sheath was placed over the wire and advanced into the inferior vena cava. Inferior vena cava was imaged and found to be widely patent on inferior venacavogram. The filter was straight in its orientation. The retrieval snare was then placed through the sheath and the hook of the filter was snared without difficulty. The sheath was then advanced, and the filter was collapsed and brought into the sheath in its entirety. It was then removed from the body in its entirety. The retrieval sheath was then removed. Pressure was held at the access site and sterile dressing was placed. The patient was taken to the recovery room in stable condition having tolerated the procedure well.  COMPLICATIONS: None  CONDITION: Stable   Leotis Pain 07/03/2017 9:54 AM  This note was created with Dragon Medical transcription system. Any errors in dictation are purely unintentional.

## 2017-09-11 ENCOUNTER — Ambulatory Visit (INDEPENDENT_AMBULATORY_CARE_PROVIDER_SITE_OTHER): Payer: Medicare Other

## 2017-09-11 ENCOUNTER — Ambulatory Visit (INDEPENDENT_AMBULATORY_CARE_PROVIDER_SITE_OTHER): Payer: Medicare Other | Admitting: Vascular Surgery

## 2017-09-11 ENCOUNTER — Encounter (INDEPENDENT_AMBULATORY_CARE_PROVIDER_SITE_OTHER): Payer: Self-pay | Admitting: Vascular Surgery

## 2017-09-11 VITALS — BP 168/81 | HR 59 | Resp 14 | Ht 70.0 in | Wt 173.0 lb

## 2017-09-11 DIAGNOSIS — I824Y1 Acute embolism and thrombosis of unspecified deep veins of right proximal lower extremity: Secondary | ICD-10-CM

## 2017-09-11 DIAGNOSIS — I825Y1 Chronic embolism and thrombosis of unspecified deep veins of right proximal lower extremity: Secondary | ICD-10-CM | POA: Diagnosis not present

## 2017-09-11 NOTE — Progress Notes (Signed)
Subjective:    Patient ID: Clayton [name], male    DOB: 08/10/1934, 83 y.o.   MRN: [id number] Chief Complaint  Patient presents with  . Follow-up    3 month DVT study f/u   Patient presents for a three month DVT follow up to the right lower extremity. The patient today presents without complaint with the exception of some elevated blood pressure.  The patient notes that his blood pressure today was 168/81.  This is not usually with the patient's blood pressure ranges.  Otherwise, the patient is without complaint.  The patient underwent a right lower extremity venous duplex which was notable for a partial chronic thrombus noted in the femoral vein popliteal vein and peroneal vein.  The patient denies any right lower extremity pain.  Patient denies any shortness of breath or chest pain.  Patient denies any fever, nausea vomiting.  Review of Systems  Constitutional: Negative.   HENT: Negative.   Eyes: Negative.   Respiratory: Negative.   Cardiovascular:       DVT  Gastrointestinal: Negative.   Endocrine: Negative.   Genitourinary: Negative.   Musculoskeletal: Negative.   Skin: Negative.   Allergic/Immunologic: Negative.   Neurological: Negative.   Hematological: Negative.   Psychiatric/Behavioral: Negative.       Objective:   Physical Exam  Constitutional: He is oriented to person, place, and time. He appears well-developed and well-nourished. No distress.  HENT:  Head: Normocephalic and atraumatic.  Right Ear: External ear normal.  Left Ear: External ear normal.  Eyes: Pupils are equal, round, and reactive to light. Conjunctivae and EOM are normal.  Neck: Normal range of motion.  Cardiovascular: Normal rate.  Pulmonary/Chest: Effort normal.  Musculoskeletal: Normal range of motion. He exhibits no edema.  Neurological: He is alert and oriented to person, place, and time.  Skin: Skin is warm and dry. He is not diaphoretic.  Psychiatric: He has a normal mood and affect.  His behavior is normal. Judgment and thought content normal.  Vitals reviewed.  BP (!) 168/81 (BP Location: Right Arm, Patient Position: Sitting)   Pulse (!) 59   Resp 14   Ht 5\' 10"  (1.778 m)   Wt 173 lb (78.5 kg)   BMI 24.82 kg/m   Past Medical History:  Diagnosis Date  . DVT (deep venous thrombosis) (HCC)    Social History   Socioeconomic History  . Marital status: Married    Spouse name: Not on file  . Number of children: Not on file  . Years of education: Not on file  . Highest education level: Not on file  Occupational History  . Not on file  Social Needs  . Financial resource strain: Not on file  . Food insecurity:    Worry: Not on file    Inability: Not on file  . Transportation needs:    Medical: Not on file    Non-medical: Not on file  Tobacco Use  . Smoking status: Former Research scientist (life sciences)  . Smokeless tobacco: Never Used  Substance and Sexual Activity  . Alcohol use: Yes    Alcohol/week: 0.0 oz  . Drug use: No  . Sexual activity: Not Currently  Lifestyle  . Physical activity:    Days per week: Not on file    Minutes per session: Not on file  . Stress: Not on file  Relationships  . Social connections:    Talks on phone: Not on file    Gets together: Not on file  Attends religious service: Not on file    Active member of club or organization: Not on file    Attends meetings of clubs or organizations: Not on file    Relationship status: Not on file  . Intimate partner violence:    Fear of current or ex partner: Not on file    Emotionally abused: Not on file    Physically abused: Not on file    Forced sexual activity: Not on file  Other Topics Concern  . Not on file  Social History Narrative  . Not on file   Past Surgical History:  Procedure Laterality Date  . COLONOSCOPY  2014   cleared for 5 yrs  . CRANIECTOMY FOR DEPRESSED SKULL FRACTURE    . IVC FILTER REMOVAL N/A 07/03/2017   Procedure: IVC FILTER REMOVAL;  Surgeon: Algernon Huxley, MD;  Location:  [facility];  Service: Cardiovascular;  Laterality: N/A;  . PERIPHERAL VASCULAR THROMBECTOMY Right 05/22/2017   Procedure: PERIPHERAL VASCULAR THROMBECTOMY;  Surgeon: Algernon Huxley, MD;  Location: [facility];  Service: Cardiovascular;  Laterality: Right;   Family History  Problem Relation Age of Onset  . Heart disease Father    Allergies  Allergen Reactions  . Aspirin Rash  . Sulfa Antibiotics Rash      Assessment & Plan:  Patient presents for a three month DVT follow up to the right lower extremity. The patient today presents without complaint with the exception of some elevated blood pressure.  The patient notes that his blood pressure today was 168/81.  This is not usually with the patient's blood pressure ranges.  Otherwise, the patient is without complaint.  The patient underwent a right lower extremity venous duplex which was notable for a partial chronic thrombus noted in the femoral vein popliteal vein and peroneal vein.  The patient denies any right lower extremity pain.  Patient denies any shortness of breath or chest pain.  Patient denies any fever, nausea vomiting.  1. Chronic deep vein thrombosis (DVT) of proximal vein of right lower extremity ([place]) - Stable Patient advised to repeat his blood pressure with his home cuff later on this afternoon.  I imagine his blood pressure will be lower.  If not, the patient should make an appointment with his primary care physician for further work-up Patient's right lower extremity DVT is now chronic in nature.  Patient has now had 3 months of anticoagulation with Eliquis.  Patient will need another 3 months for a full 40-month course. I will bring the patient back in another 3 months to assess his DVT, hopefully at that point will have resolved If the patient experiences any shortness of breath or chest pain he should seek medical attention immediately at the emergency department  - VAS [place] LOWER EXTREMITY VENOUS (DVT);  Future   Current Outpatient Medications on File Prior to Visit  Medication Sig Dispense Refill  . apixaban (ELIQUIS) 5 MG TABS tablet Take 1 tablet (5 mg total) by mouth 2 (two) times daily. 60 tablet 5   No current facility-administered medications on file prior to visit.     There are no Patient Instructions on file for this visit. No follow-ups on file.   KIMBERLY A STEGMAYER, PA-C

## 2017-11-21 DIAGNOSIS — H2513 Age-related nuclear cataract, bilateral: Secondary | ICD-10-CM | POA: Diagnosis not present

## 2017-12-12 DIAGNOSIS — I441 Atrioventricular block, second degree: Secondary | ICD-10-CM | POA: Diagnosis not present

## 2017-12-12 DIAGNOSIS — R001 Bradycardia, unspecified: Secondary | ICD-10-CM | POA: Diagnosis not present

## 2017-12-12 DIAGNOSIS — I82531 Chronic embolism and thrombosis of right popliteal vein: Secondary | ICD-10-CM | POA: Diagnosis not present

## 2017-12-12 DIAGNOSIS — I10 Essential (primary) hypertension: Secondary | ICD-10-CM | POA: Diagnosis not present

## 2017-12-19 ENCOUNTER — Ambulatory Visit (INDEPENDENT_AMBULATORY_CARE_PROVIDER_SITE_OTHER): Payer: Medicare Other

## 2017-12-19 ENCOUNTER — Other Ambulatory Visit: Payer: Self-pay

## 2017-12-19 ENCOUNTER — Ambulatory Visit (INDEPENDENT_AMBULATORY_CARE_PROVIDER_SITE_OTHER): Payer: Medicare Other | Admitting: Vascular Surgery

## 2017-12-19 ENCOUNTER — Encounter (INDEPENDENT_AMBULATORY_CARE_PROVIDER_SITE_OTHER): Payer: Self-pay | Admitting: Vascular Surgery

## 2017-12-19 VITALS — BP 149/82 | HR 61 | Ht 69.0 in | Wt 175.0 lb

## 2017-12-19 DIAGNOSIS — I825Y1 Chronic embolism and thrombosis of unspecified deep veins of right proximal lower extremity: Secondary | ICD-10-CM

## 2017-12-19 MED ORDER — CLOPIDOGREL BISULFATE 75 MG PO TABS: 75.0000 mg | ORAL_TABLET | Freq: Every day | ORAL | 11 refills | Status: DC

## 2017-12-19 NOTE — Assessment & Plan Note (Signed)
Duplex today shows only chronic appearing right lower extremity DVT with no acute component or progression from previous studies. Overall he is doing quite well.  He really does not have much in the way of postphlebitic symptoms so performing the venous thrombectomy and thrombolytic procedure was likely a major benefit.  Should still consider wearing compression stockings and trying to elevate his legs when possible.  At this point, he has completed over 6 months of full anticoagulation and can be switched to antiplatelet therapy.  I would normally use aspirin but he apparently has an intolerance to this.  We are going to prescribe him Plavix instead.  I will see him back as needed.

## 2017-12-19 NOTE — Progress Notes (Signed)
MRN : [id number]  Clayton [name] is a 83 y.o. (March 16, 1935) male who presents with chief complaint of  Chief Complaint  Patient presents with  . Follow-up    3 month follow up from DVT  .  History of Present Illness: Patient returns today in follow up of right leg DVT.  At this point, he really does not have much in the way of leg swelling or pain.  His IVC filter has been removed.  He underwent thrombectomy about 7 months ago with resultant improvement in pain and swelling.  He is on Eliquis this time but would like to get off of that if possible.  Duplex today shows only chronic appearing right lower extremity DVT with no acute component or progression from previous studies.  Current Outpatient Medications  Medication Sig Dispense Refill  . clopidogrel (PLAVIX) 75 MG tablet Take 1 tablet (75 mg total) by mouth daily. 30 tablet 11   No current facility-administered medications for this visit.     Past Medical History:  Diagnosis Date  . DVT (deep venous thrombosis) ([place])     Past Surgical History:  Procedure Laterality Date  . COLONOSCOPY  2014   cleared for 5 yrs  . CRANIECTOMY FOR DEPRESSED SKULL FRACTURE    . IVC FILTER REMOVAL N/A 07/03/2017   Procedure: IVC FILTER REMOVAL;  Surgeon: Algernon Huxley, MD;  Location: [facility];  Service: Cardiovascular;  Laterality: N/A;  . PERIPHERAL VASCULAR THROMBECTOMY Right 05/22/2017   Procedure: PERIPHERAL VASCULAR THROMBECTOMY;  Surgeon: Algernon Huxley, MD;  Location: [facility];  Service: Cardiovascular;  Laterality: Right;    Social History Social History   Tobacco Use  . Smoking status: Former Research scientist (life sciences)  . Smokeless tobacco: Never Used  Substance Use Topics  . Alcohol use: Yes    Alcohol/week: 0.0 standard drinks  . Drug use: No     Family History Family History  Problem Relation Age of Onset  . Heart disease Father     Allergies  Allergen Reactions  . Aspirin Rash  . Sulfa Antibiotics Rash      REVIEW OF SYSTEMS (Negative unless checked)  Constitutional: [] Weight loss  [] Fever  [] Chills Cardiac: [] Chest pain   [] Chest pressure   [] Palpitations   [] Shortness of breath when laying flat   [] Shortness of breath at rest   [] Shortness of breath with exertion. Vascular:  [] Pain in legs with walking   [] Pain in legs at rest   [] Pain in legs when laying flat   [] Claudication   [] Pain in feet when walking  [] Pain in feet at rest  [] Pain in feet when laying flat   [x] History of DVT   [x] Phlebitis   [] Swelling in legs   [] Varicose veins   [] Non-healing ulcers Pulmonary:   [] Uses home oxygen   [] Productive cough   [] Hemoptysis   [] Wheeze  [] COPD   [] Asthma Neurologic:  [] Dizziness  [] Blackouts   [] Seizures   [] History of stroke   [] History of TIA  [] Aphasia   [] Temporary blindness   [] Dysphagia   [] Weakness or numbness in arms   [] Weakness or numbness in legs Musculoskeletal:  [] Arthritis   [] Joint swelling   [] Joint pain   [] Low back pain Hematologic:  [] Easy bruising  [] Easy bleeding   [] Hypercoagulable state   [] Anemic   Gastrointestinal:  [] Blood in stool   [] Vomiting blood  [] Gastroesophageal reflux/heartburn   [] Abdominal pain Genitourinary:  [] Chronic kidney disease   [] Difficult urination  [] Frequent urination  []   Burning with urination   [] Hematuria Skin:  [] Rashes   [] Ulcers   [] Wounds Psychological:  [] History of anxiety   []  History of major depression.  Physical Examination  BP (!) 149/82 (BP Location: Right Arm)   Pulse 61   Ht 5\' 9"  (1.753 m)   Wt 175 lb (79.4 kg)   BMI 25.84 kg/m  Gen:  WD/WN, NAD and appears younger than stated age Head: [place]/AT, No temporalis wasting. Ear/Nose/Throat: Hearing grossly intact, nares w/o erythema or drainage Eyes: Conjunctiva clear. Sclera non-icteric Neck: Supple.  Trachea midline Pulmonary:  Good air movement, no use of accessory muscles.  Cardiac: RRR, no JVD Vascular:  Vessel Right Left  Radial Palpable Palpable                           PT Palpable Palpable  DP Palpable Palpable    Musculoskeletal: M/S 5/5 throughout.  No deformity or atrophy.  No appreciable lower extremity edema. Neurologic: Sensation grossly intact in extremities.  Symmetrical.  Speech is fluent.  Psychiatric: Judgment intact, Mood & affect appropriate for pt's clinical situation. Dermatologic: No rashes or ulcers noted.  No cellulitis or open wounds.       Labs No results found for this or any previous visit (from the past 2160 hour(s)).  Radiology No results found.  Assessment/Plan  Deep vein thrombosis (DVT) of proximal vein of right lower extremity (HCC) Duplex today shows only chronic appearing right lower extremity DVT with no acute component or progression from previous studies. Overall he is doing quite well.  He really does not have much in the way of postphlebitic symptoms so performing the venous thrombectomy and thrombolytic procedure was likely a major benefit.  Should still consider wearing compression stockings and trying to elevate his legs when possible.  At this point, he has completed over 6 months of full anticoagulation and can be switched to antiplatelet therapy.  I would normally use aspirin but he apparently has an intolerance to this.  We are going to prescribe him Plavix instead.  I will see him back as needed.    Leotis Pain, MD  12/19/2017 1:12 PM    This note was created with Dragon medical transcription system.  Any errors from dictation are purely unintentional

## 2018-01-02 ENCOUNTER — Telehealth (INDEPENDENT_AMBULATORY_CARE_PROVIDER_SITE_OTHER): Payer: Self-pay

## 2018-01-02 MED ORDER — CLOPIDOGREL BISULFATE 75 MG PO TABS: 75.0000 mg | ORAL_TABLET | Freq: Every day | ORAL | 11 refills | Status: DC

## 2018-01-02 NOTE — Telephone Encounter (Signed)
Pharmacy called to get Plavix refilled, says patient showed up to get medication but the medication authorization never went through on their side. I sent the prescription in to the pharmacy electronically.

## 2018-01-04 ENCOUNTER — Telehealth (INDEPENDENT_AMBULATORY_CARE_PROVIDER_SITE_OTHER): Payer: Self-pay

## 2018-01-04 NOTE — Telephone Encounter (Signed)
Patient's wife called again and stated that she has gone to [facility] in [place] a few times and that they still do not have the prescription for her husbands Plavix. During our last phone conversation I told her that I would be sending over the prescription via electronically, but it's now days later and the prescription still hasn't made it there.  So I called in to the pharmacy to get it done over the phone. He assured me that everything was a go.

## 2018-01-10 DIAGNOSIS — L570 Actinic keratosis: Secondary | ICD-10-CM | POA: Diagnosis not present

## 2018-01-10 DIAGNOSIS — C4441 Basal cell carcinoma of skin of scalp and neck: Secondary | ICD-10-CM | POA: Diagnosis not present

## 2018-01-10 DIAGNOSIS — C44319 Basal cell carcinoma of skin of other parts of face: Secondary | ICD-10-CM | POA: Diagnosis not present

## 2018-01-10 DIAGNOSIS — D485 Neoplasm of uncertain behavior of skin: Secondary | ICD-10-CM | POA: Diagnosis not present

## 2018-01-10 DIAGNOSIS — L821 Other seborrheic keratosis: Secondary | ICD-10-CM | POA: Diagnosis not present

## 2018-03-06 DIAGNOSIS — L988 Other specified disorders of the skin and subcutaneous tissue: Secondary | ICD-10-CM | POA: Diagnosis not present

## 2018-03-06 DIAGNOSIS — L578 Other skin changes due to chronic exposure to nonionizing radiation: Secondary | ICD-10-CM | POA: Diagnosis not present

## 2018-03-06 DIAGNOSIS — C44319 Basal cell carcinoma of skin of other parts of face: Secondary | ICD-10-CM | POA: Diagnosis not present

## 2018-03-06 DIAGNOSIS — C4441 Basal cell carcinoma of skin of scalp and neck: Secondary | ICD-10-CM | POA: Diagnosis not present

## 2018-08-17 ENCOUNTER — Ambulatory Visit: Payer: Medicare Other | Admitting: Family Medicine

## 2018-08-17 ENCOUNTER — Encounter: Payer: Self-pay | Admitting: Family Medicine

## 2018-08-17 ENCOUNTER — Ambulatory Visit
Admission: RE | Admit: 2018-08-17 | Discharge: 2018-08-17 | Disposition: A | Discharge status: Home / Self Care | Payer: Medicare Other | Source: Ambulatory Visit | Attending: Family Medicine | Admitting: Family Medicine

## 2018-08-17 ENCOUNTER — Ambulatory Visit
Admission: RE | Admit: 2018-08-17 | Discharge: 2018-08-17 | Disposition: A | Discharge status: Home / Self Care | Payer: Medicare Other | Attending: Family Medicine | Admitting: Family Medicine

## 2018-08-17 ENCOUNTER — Other Ambulatory Visit: Payer: Self-pay

## 2018-08-17 VITALS — BP 132/80 | HR 76 | Ht 69.0 in | Wt 177.0 lb

## 2018-08-17 DIAGNOSIS — M545 Low back pain, unspecified: Secondary | ICD-10-CM

## 2018-08-17 DIAGNOSIS — R351 Nocturia: Secondary | ICD-10-CM | POA: Diagnosis not present

## 2018-08-17 DIAGNOSIS — I825Y1 Chronic embolism and thrombosis of unspecified deep veins of right proximal lower extremity: Secondary | ICD-10-CM

## 2018-08-17 DIAGNOSIS — R195 Other fecal abnormalities: Secondary | ICD-10-CM | POA: Diagnosis not present

## 2018-08-17 DIAGNOSIS — G8929 Other chronic pain: Secondary | ICD-10-CM

## 2018-08-17 LAB — POCT URINALYSIS DIPSTICK
Bilirubin, UA: NEGATIVE
Glucose, UA: NEGATIVE
Ketones, UA: NEGATIVE
Leukocytes, UA: NEGATIVE
Nitrite, UA: NEGATIVE
Protein, UA: NEGATIVE
Spec Grav, UA: 1.01 (ref 1.010–1.025)
Urobilinogen, UA: 0.2 E.U./dL
pH, UA: 6 (ref 5.0–8.0)

## 2018-08-17 LAB — HEMOCCULT GUIAC POC 1CARD (OFFICE): Fecal Occult Blood, POC: POSITIVE — AB

## 2018-08-17 MED ORDER — MELOXICAM 15 MG PO TABS: 15.0000 mg | ORAL_TABLET | Freq: Every day | ORAL | 0 refills | Status: DC

## 2018-08-17 MED ORDER — CYCLOBENZAPRINE HCL 10 MG PO TABS: 10.0000 mg | ORAL_TABLET | Freq: Every day | ORAL | 0 refills | Status: DC

## 2018-08-17 NOTE — Patient Instructions (Signed)
This information is directly available on the CDC website: https://www.cdc.gov/coronavirus/2019-ncov/if-you-are-sick/steps-when-sick.html    Source:CDC Reference to specific commercial products, manufacturers, companies, or trademarks does not constitute its endorsement or recommendation by the U.S. Government, Department of Health and Human Services, or Centers for Disease Control and Prevention.  

## 2018-08-17 NOTE — Progress Notes (Signed)
Date:  08/17/2018   Name:  Clayton [name]   DOB:  27-Mar-1935   MRN:  [id number]   Chief Complaint: Back Pain (lower back pain/ hurting more on the left radiating to the L) hip. Doesn't recall doing anything to hurt it or cause the pain. It's been going on for about a month, but getting worse. Starts out tollerable in the morning, but by the evening it is really bothering him.)  Back Pain  This is a new problem. The current episode started 1 to 4 weeks ago (about a month ago). The problem occurs intermittently. The problem has been waxing and waning since onset. The pain is present in the lumbar spine. The quality of the pain is described as aching. Radiates to: left back and upper thigh. The pain is at a severity of 7/10. The pain is moderate. Exacerbated by: as the day progresses. Stiffness is present at night. Associated symptoms include leg pain. Pertinent negatives include no abdominal pain, bladder incontinence, bowel incontinence, chest pain, dysuria, fever, headaches, numbness, paresis, paresthesias, pelvic pain, perianal numbness, tingling, weakness or weight loss. He has tried NSAIDs for the symptoms. The treatment provided moderate relief.    Review of Systems  Constitutional: Negative for chills, fever and weight loss.  HENT: Negative for drooling, ear discharge, ear pain and sore throat.   Respiratory: Negative for cough, shortness of breath and wheezing.   Cardiovascular: Negative for chest pain, palpitations and leg swelling.  Gastrointestinal: Negative for abdominal pain, blood in stool, bowel incontinence, constipation, diarrhea and nausea.  Endocrine: Negative for polydipsia.  Genitourinary: Negative for bladder incontinence, difficulty urinating, dysuria, flank pain, frequency, hematuria, pelvic pain and urgency.       Nocturia  Musculoskeletal: Positive for back pain. Negative for myalgias and neck pain.  Skin: Negative for rash.  Allergic/Immunologic: Negative for  environmental allergies.  Neurological: Negative for dizziness, tingling, weakness, numbness, headaches and paresthesias.  Hematological: Does not bruise/bleed easily.  Psychiatric/Behavioral: Negative for suicidal ideas. The patient is not nervous/anxious.     Patient Active Problem List   Diagnosis Date Noted  . Deep vein thrombosis (DVT) of proximal vein of right lower extremity ([place]) 06/14/2017    Allergies  Allergen Reactions  . Aspirin Rash  . Sulfa Antibiotics Rash    Past Surgical History:  Procedure Laterality Date  . COLONOSCOPY  2014   cleared for 5 yrs  . CRANIECTOMY FOR DEPRESSED SKULL FRACTURE    . IVC FILTER REMOVAL N/A 07/03/2017   Procedure: IVC FILTER REMOVAL;  Surgeon: Algernon Huxley, MD;  Location: [facility];  Service: Cardiovascular;  Laterality: N/A;  . PERIPHERAL VASCULAR THROMBECTOMY Right 05/22/2017   Procedure: PERIPHERAL VASCULAR THROMBECTOMY;  Surgeon: Algernon Huxley, MD;  Location: [facility];  Service: Cardiovascular;  Laterality: Right;    Social History   Tobacco Use  . Smoking status: Former Research scientist (life sciences)  . Smokeless tobacco: Never Used  Substance Use Topics  . Alcohol use: Yes    Alcohol/week: 0.0 standard drinks  . Drug use: No     Medication list has been reviewed and updated.  Current Meds  Medication Sig  . clopidogrel (PLAVIX) 75 MG tablet Take 1 tablet (75 mg total) by mouth daily.    PHQ 2/9 Scores 05/16/2017 04/10/2015  PHQ - 2 Score 0 0  PHQ- 9 Score 0 -    BP Readings from Last 3 Encounters:  08/17/18 132/80  12/19/17 (!) 149/82  09/11/17 Marland Kitchen)  168/81    Physical Exam Vitals signs and nursing note reviewed.  HENT:     Head: Normocephalic.     Right Ear: Tympanic membrane, ear canal and external ear normal.     Left Ear: Ear canal and external ear normal.     Nose: Nose normal.     Mouth/Throat:     Mouth: Mucous membranes are moist.     Pharynx: Oropharynx is clear. No oropharyngeal exudate or posterior  oropharyngeal erythema.  Eyes:     General: No scleral icterus.       Right eye: No discharge.        Left eye: No discharge.     Conjunctiva/sclera: Conjunctivae normal.     Pupils: Pupils are equal, round, and reactive to light.  Neck:     Musculoskeletal: Normal range of motion and neck supple.     Thyroid: No thyromegaly.     Vascular: No JVD.     Trachea: No tracheal deviation.  Cardiovascular:     Rate and Rhythm: Normal rate and regular rhythm.     Pulses: Normal pulses.     Heart sounds: Normal heart sounds. No murmur. No friction rub. No gallop.   Pulmonary:     Effort: Pulmonary effort is normal. No respiratory distress.     Breath sounds: Normal breath sounds. No wheezing, rhonchi or rales.  Abdominal:     General: Abdomen is flat. Bowel sounds are normal.     Palpations: Abdomen is soft. There is no hepatomegaly, splenomegaly or mass.     Tenderness: There is no abdominal tenderness. There is no guarding or rebound.  Genitourinary:    Prostate: Not enlarged, not tender and no nodules present.     Rectum: Guaiac result positive. No mass or tenderness.  Musculoskeletal: Normal range of motion.        General: No tenderness.  Lymphadenopathy:     Cervical: No cervical adenopathy.  Skin:    General: Skin is warm.     Findings: No rash.  Neurological:     Mental Status: He is alert and oriented to person, place, and time.     Cranial Nerves: No cranial nerve deficit.     Deep Tendon Reflexes: Reflexes are normal and symmetric.     Wt Readings from Last 3 Encounters:  08/17/18 177 lb (80.3 kg)  12/19/17 175 lb (79.4 kg)  09/11/17 173 lb (78.5 kg)    BP 132/80   Pulse 76   Ht 5\' 9"  (1.753 m)   Wt 177 lb (80.3 kg)   BMI 26.14 kg/m   Assessment and Plan: 1. Chronic deep vein thrombosis (DVT) of proximal vein of right lower extremity (HCC) Chronic.  Controlled.  Patient will continue his Plavix for his chronic deep vein thrombosis circumstance.  2. Chronic  right-sided low back pain, unspecified whether sciatica present New onset.  This is the pain that he does not awake with but during the course of the day gradually gets worse sin the lower back and during the latter part of the day will start to settle in the left sciatic and lumbar area.  We will get a x-ray of his lower back and we will use meloxicam patient is able to take ibuprofen 15 mg daily along with cyclobenzaprine Flexeril 10 mg nightly in the meantime we will get lab work including PSA CBC and renal function panel. - POCT urinalysis dipstick - POCT occult blood stool - DG Lumbar Spine Complete; Future -  meloxicam (MOBIC) 15 MG tablet; Take 1 tablet (15 mg total) by mouth daily.  Dispense: 30 tablet; Refill: 0 - cyclobenzaprine (FLEXERIL) 10 MG tablet; Take 1 tablet (10 mg total) by mouth at bedtime.  Dispense: 30 tablet; Refill: 0 - PSA - CBC with Differential/Platelet - Renal Function Panel  3. Guaiac positive stools The course of the rectal exam was noted that the guaiac was positive.  During the course of the exam it was noted that the patient had a colonoscopy about 8 years ago.  Referral to gastroenterology was made in the meantime we will check a CBC to access the level of his hemoglobin. - POCT occult blood stool - Ambulatory referral to Gastroenterology - CBC with Differential/Platelet  4. Nocturia Patient has episodes of nocturia particularly having to go to the bathroom 2 times at night when he gets up to let the dog out.  Will check a PSA to evaluate this..  DRE exam was unremarkable. - PSA

## 2018-08-18 LAB — CBC WITH DIFFERENTIAL/PLATELET
Basophils Absolute: 0.1 10*3/uL (ref 0.0–0.2)
Basos: 1 %
EOS (ABSOLUTE): 0.1 10*3/uL (ref 0.0–0.4)
Eos: 1 %
Hematocrit: 44.9 % (ref 37.5–51.0)
Hemoglobin: 15.3 g/dL (ref 13.0–17.7)
Immature Grans (Abs): 0 10*3/uL (ref 0.0–0.1)
Immature Granulocytes: 0 %
Lymphocytes Absolute: 1.4 10*3/uL (ref 0.7–3.1)
Lymphs: 15 %
MCH: 33.3 pg — ABNORMAL HIGH (ref 26.6–33.0)
MCHC: 34.1 g/dL (ref 31.5–35.7)
MCV: 98 fL — ABNORMAL HIGH (ref 79–97)
Monocytes Absolute: 0.8 10*3/uL (ref 0.1–0.9)
Monocytes: 9 %
Neutrophils Absolute: 6.8 10*3/uL (ref 1.4–7.0)
Neutrophils: 74 %
Platelets: 330 10*3/uL (ref 150–450)
RBC: 4.59 x10E6/uL (ref 4.14–5.80)
RDW: 12.7 % (ref 11.6–15.4)
WBC: 9.1 10*3/uL (ref 3.4–10.8)

## 2018-08-18 LAB — RENAL FUNCTION PANEL
Albumin: 4.7 g/dL — ABNORMAL HIGH (ref 3.6–4.6)
BUN/Creatinine Ratio: 12 (ref 10–24)
BUN: 15 mg/dL (ref 8–27)
CO2: 21 mmol/L (ref 20–29)
Calcium: 9.3 mg/dL (ref 8.6–10.2)
Chloride: 102 mmol/L (ref 96–106)
Creatinine, Ser: 1.25 mg/dL (ref 0.76–1.27)
GFR calc Af Amer: 62 mL/min/{1.73_m2} (ref >59)
GFR calc non Af Amer: 54 mL/min/{1.73_m2} — ABNORMAL LOW (ref >59)
Glucose: 94 mg/dL (ref 65–99)
Phosphorus: 3.1 mg/dL (ref 2.8–4.1)
Potassium: 4.5 mmol/L (ref 3.5–5.2)
Sodium: 140 mmol/L (ref 134–144)

## 2018-08-18 LAB — PSA: Prostate Specific Ag, Serum: 1.7 ng/mL (ref 0.0–4.0)

## 2018-08-20 ENCOUNTER — Other Ambulatory Visit: Payer: Self-pay

## 2018-08-30 ENCOUNTER — Encounter: Payer: Self-pay | Admitting: Emergency Medicine

## 2018-08-30 ENCOUNTER — Inpatient Hospital Stay
Admission: EM | Admit: 2018-08-30 | Discharge: 2018-09-02 | DRG: 378 | Disposition: A | Discharge status: Other Acute Inpatient Hospital | Payer: Medicare Other | Attending: Internal Medicine | Admitting: Internal Medicine

## 2018-08-30 ENCOUNTER — Other Ambulatory Visit: Payer: Self-pay

## 2018-08-30 DIAGNOSIS — I129 Hypertensive chronic kidney disease with stage 1 through stage 4 chronic kidney disease, or unspecified chronic kidney disease: Secondary | ICD-10-CM | POA: Diagnosis present

## 2018-08-30 DIAGNOSIS — Z8249 Family history of ischemic heart disease and other diseases of the circulatory system: Secondary | ICD-10-CM | POA: Diagnosis not present

## 2018-08-30 DIAGNOSIS — M545 Low back pain: Secondary | ICD-10-CM | POA: Diagnosis not present

## 2018-08-30 DIAGNOSIS — Z882 Allergy status to sulfonamides status: Secondary | ICD-10-CM

## 2018-08-30 DIAGNOSIS — K3189 Other diseases of stomach and duodenum: Secondary | ICD-10-CM

## 2018-08-30 DIAGNOSIS — K922 Gastrointestinal hemorrhage, unspecified: Secondary | ICD-10-CM

## 2018-08-30 DIAGNOSIS — K5710 Diverticulosis of small intestine without perforation or abscess without bleeding: Secondary | ICD-10-CM | POA: Diagnosis not present

## 2018-08-30 DIAGNOSIS — K648 Other hemorrhoids: Secondary | ICD-10-CM | POA: Diagnosis present

## 2018-08-30 DIAGNOSIS — R000 Tachycardia, unspecified: Secondary | ICD-10-CM | POA: Diagnosis not present

## 2018-08-30 DIAGNOSIS — Z85828 Personal history of other malignant neoplasm of skin: Secondary | ICD-10-CM

## 2018-08-30 DIAGNOSIS — I10 Essential (primary) hypertension: Secondary | ICD-10-CM | POA: Diagnosis not present

## 2018-08-30 DIAGNOSIS — Z886 Allergy status to analgesic agent status: Secondary | ICD-10-CM | POA: Diagnosis not present

## 2018-08-30 DIAGNOSIS — Z95828 Presence of other vascular implants and grafts: Secondary | ICD-10-CM

## 2018-08-30 DIAGNOSIS — Z7902 Long term (current) use of antithrombotics/antiplatelets: Secondary | ICD-10-CM

## 2018-08-30 DIAGNOSIS — Z87891 Personal history of nicotine dependence: Secondary | ICD-10-CM

## 2018-08-30 DIAGNOSIS — Z79899 Other long term (current) drug therapy: Secondary | ICD-10-CM

## 2018-08-30 DIAGNOSIS — Z86718 Personal history of other venous thrombosis and embolism: Secondary | ICD-10-CM | POA: Diagnosis not present

## 2018-08-30 DIAGNOSIS — N183 Chronic kidney disease, stage 3 unspecified: Secondary | ICD-10-CM | POA: Diagnosis present

## 2018-08-30 DIAGNOSIS — I824Y1 Acute embolism and thrombosis of unspecified deep veins of right proximal lower extremity: Secondary | ICD-10-CM | POA: Diagnosis present

## 2018-08-30 DIAGNOSIS — K5791 Diverticulosis of intestine, part unspecified, without perforation or abscess with bleeding: Secondary | ICD-10-CM | POA: Diagnosis not present

## 2018-08-30 DIAGNOSIS — K5731 Diverticulosis of large intestine without perforation or abscess with bleeding: Principal | ICD-10-CM

## 2018-08-30 DIAGNOSIS — M7989 Other specified soft tissue disorders: Secondary | ICD-10-CM | POA: Diagnosis not present

## 2018-08-30 DIAGNOSIS — Z791 Long term (current) use of non-steroidal anti-inflammatories (NSAID): Secondary | ICD-10-CM

## 2018-08-30 DIAGNOSIS — D62 Acute posthemorrhagic anemia: Secondary | ICD-10-CM | POA: Diagnosis present

## 2018-08-30 DIAGNOSIS — K449 Diaphragmatic hernia without obstruction or gangrene: Secondary | ICD-10-CM | POA: Diagnosis not present

## 2018-08-30 DIAGNOSIS — E538 Deficiency of other specified B group vitamins: Secondary | ICD-10-CM | POA: Diagnosis present

## 2018-08-30 DIAGNOSIS — K921 Melena: Secondary | ICD-10-CM | POA: Diagnosis present

## 2018-08-30 DIAGNOSIS — D649 Anemia, unspecified: Secondary | ICD-10-CM | POA: Diagnosis not present

## 2018-08-30 DIAGNOSIS — I82509 Chronic embolism and thrombosis of unspecified deep veins of unspecified lower extremity: Secondary | ICD-10-CM | POA: Diagnosis not present

## 2018-08-30 DIAGNOSIS — K219 Gastro-esophageal reflux disease without esophagitis: Secondary | ICD-10-CM

## 2018-08-30 DIAGNOSIS — R609 Edema, unspecified: Secondary | ICD-10-CM

## 2018-08-30 DIAGNOSIS — K5790 Diverticulosis of intestine, part unspecified, without perforation or abscess without bleeding: Secondary | ICD-10-CM | POA: Diagnosis not present

## 2018-08-30 DIAGNOSIS — K2970 Gastritis, unspecified, without bleeding: Secondary | ICD-10-CM | POA: Diagnosis not present

## 2018-08-30 HISTORY — DX: Melena: K92.1

## 2018-08-30 HISTORY — DX: Basal cell carcinoma of skin, unspecified: C44.91

## 2018-08-30 HISTORY — DX: Chronic kidney disease, unspecified: N18.9

## 2018-08-30 HISTORY — DX: Essential (primary) hypertension: I10

## 2018-08-30 LAB — COMPREHENSIVE METABOLIC PANEL
ALT: 18 U/L (ref 0–44)
AST: 21 U/L (ref 15–41)
Albumin: 3.7 g/dL (ref 3.5–5.0)
Alkaline Phosphatase: 48 U/L (ref 38–126)
Anion gap: 9 (ref 5–15)
BUN: 23 mg/dL (ref 8–23)
CO2: 22 mmol/L (ref 22–32)
Calcium: 8.3 mg/dL — ABNORMAL LOW (ref 8.9–10.3)
Chloride: 107 mmol/L (ref 98–111)
Creatinine, Ser: 1.36 mg/dL — ABNORMAL HIGH (ref 0.61–1.24)
GFR calc Af Amer: 56 mL/min — ABNORMAL LOW (ref >60)
GFR calc non Af Amer: 48 mL/min — ABNORMAL LOW (ref >60)
Glucose, Bld: 215 mg/dL — ABNORMAL HIGH (ref 70–99)
Potassium: 3.9 mmol/L (ref 3.5–5.1)
Sodium: 138 mmol/L (ref 135–145)
Total Bilirubin: 0.6 mg/dL (ref 0.3–1.2)
Total Protein: 6.7 g/dL (ref 6.5–8.1)

## 2018-08-30 LAB — GASTROINTESTINAL PANEL BY PCR, STOOL (REPLACES STOOL CULTURE)

## 2018-08-30 LAB — CBC WITH DIFFERENTIAL/PLATELET
Abs Immature Granulocytes: 0.07 10*3/uL (ref 0.00–0.07)
Basophils Absolute: 0.1 10*3/uL (ref 0.0–0.1)
Basophils Relative: 1 %
Eosinophils Absolute: 0.1 10*3/uL (ref 0.0–0.5)
Eosinophils Relative: 1 %
HCT: 39 % (ref 39.0–52.0)
Hemoglobin: 12.6 g/dL — ABNORMAL LOW (ref 13.0–17.0)
Immature Granulocytes: 1 %
Lymphocytes Relative: 17 %
Lymphs Abs: 1.9 10*3/uL (ref 0.7–4.0)
MCH: 33 pg (ref 26.0–34.0)
MCHC: 32.3 g/dL (ref 30.0–36.0)
MCV: 102.1 fL — ABNORMAL HIGH (ref 80.0–100.0)
Monocytes Absolute: 0.9 10*3/uL (ref 0.1–1.0)
Monocytes Relative: 8 %
Neutro Abs: 8.5 10*3/uL — ABNORMAL HIGH (ref 1.7–7.7)
Neutrophils Relative %: 72 %
Platelets: 320 10*3/uL (ref 150–400)
RBC: 3.82 MIL/uL — ABNORMAL LOW (ref 4.22–5.81)
RDW: 13 % (ref 11.5–15.5)
WBC: 11.6 10*3/uL — ABNORMAL HIGH (ref 4.0–10.5)
nRBC: 0 % (ref 0.0–0.2)

## 2018-08-30 LAB — C DIFFICILE QUICK SCREEN W PCR REFLEX
C Diff antigen: NEGATIVE
C Diff interpretation: NOT DETECTED
C Diff toxin: NEGATIVE

## 2018-08-30 LAB — LACTIC ACID, PLASMA
Lactic Acid, Venous: 2.7 mmol/L (ref 0.5–1.9)
Lactic Acid, Venous: 1.8 mmol/L (ref 0.5–1.9)

## 2018-08-30 LAB — LIPASE, BLOOD: Lipase: 22 U/L (ref 11–51)

## 2018-08-30 LAB — HEMOGLOBIN: Hemoglobin: 11.2 g/dL — ABNORMAL LOW (ref 13.0–17.0)

## 2018-08-30 MED ORDER — ACETAMINOPHEN 325 MG PO TABS: 650.0000 mg | ORAL_TABLET | Freq: Four times a day (QID) | ORAL | Status: DC | PRN

## 2018-08-30 MED ORDER — ONDANSETRON HCL 4 MG PO TABS: 4.0000 mg | ORAL_TABLET | Freq: Four times a day (QID) | ORAL | Status: DC | PRN

## 2018-08-30 MED ORDER — SODIUM CHLORIDE 0.9 % IV BOLUS
1000.0000 mL | Freq: Once | INTRAVENOUS | Status: AC
  Administered 2018-08-30: 20:00:00 1000 mL via INTRAVENOUS

## 2018-08-30 MED ORDER — ONDANSETRON HCL 4 MG/2ML IJ SOLN: 4.0000 mg | Freq: Four times a day (QID) | INTRAMUSCULAR | Status: DC | PRN

## 2018-08-30 MED ORDER — SODIUM CHLORIDE 0.9 % IV SOLN
INTRAVENOUS | Status: AC
  Administered 2018-08-30 – 2018-08-31 (×2): via INTRAVENOUS

## 2018-08-30 MED ORDER — PANTOPRAZOLE SODIUM 40 MG IV SOLR
40.0000 mg | Freq: Once | INTRAVENOUS | Status: AC
  Administered 2018-08-30: 40 mg via INTRAVENOUS
  Filled 2018-08-30: qty 40

## 2018-08-30 MED ORDER — PANTOPRAZOLE SODIUM 40 MG IV SOLR
40.0000 mg | Freq: Two times a day (BID) | INTRAVENOUS | Status: DC
  Administered 2018-08-31 – 2018-09-01 (×4): 40 mg via INTRAVENOUS
  Filled 2018-08-30 (×4): qty 40

## 2018-08-30 MED ORDER — ACETAMINOPHEN 650 MG RE SUPP: 650.0000 mg | Freq: Four times a day (QID) | RECTAL | Status: DC | PRN

## 2018-08-30 NOTE — ED Notes (Signed)
Patient had bowel movement with dark red blood.

## 2018-08-30 NOTE — ED Notes (Signed)
ED TO INPATIENT HANDOFF REPORT  ED Nurse Name and Phone #: Joelene Millin RN [phone]  S Name/Age/Gender Clayton [name] 84 y.o. male Room/Bed: ED15A/ED15A  Code Status   Code Status: Not on file  Home/SNF/Other Home Patient oriented to: self, place, time and situation Is this baseline? Yes   Triage Complete: Triage complete  Chief Complaint blood in stool  Triage Note Pt to triage via w/c with no distress noted, mask in place; Pt reports diarrhea x 2-3 days; denies any accomp symptoms; has not tried any remedies; rx mobic and flexeril 2wks ago for back pain and unconcerned that this may be related   Allergies Allergies  Allergen Reactions  . Aspirin Rash  . Sulfa Antibiotics Rash    Level of Care/Admitting Diagnosis ED Disposition    ED Disposition Condition [facility]: [place] [[id number]]  Level of Care: Med-Surg [16]  Covid Evaluation: N/A  Diagnosis: Blood in stool [578.1.ICD-9-CM]  Admitting Physician: Lance Coon [[id number]]  Attending Physician: Lance Coon [phone]  Estimated length of stay: past midnight tomorrow  Certification:: I certify this patient will need inpatient services for at least 2 midnights  PT Class (Do Not Modify): Inpatient [101]  PT Acc Code (Do Not Modify): Private [1]       B Medical/Surgery History Past Medical History:  Diagnosis Date  . Basal cell carcinoma   . CKD (chronic kidney disease)   . DVT (deep venous thrombosis) ([place])   . HTN (hypertension)    Past Surgical History:  Procedure Laterality Date  . COLONOSCOPY  2014   cleared for 5 yrs  . CRANIECTOMY FOR DEPRESSED SKULL FRACTURE    . IVC FILTER REMOVAL N/A 07/03/2017   Procedure: IVC FILTER REMOVAL;  Surgeon: Algernon Huxley, MD;  Location: [facility];  Service: Cardiovascular;  Laterality: N/A;  . PERIPHERAL VASCULAR THROMBECTOMY Right 05/22/2017   Procedure: PERIPHERAL VASCULAR THROMBECTOMY;  Surgeon: Algernon Huxley, MD;  Location: [facility];  Service: Cardiovascular;  Laterality: Right;     A IV Location/Drains/Wounds Patient Lines/Drains/Airways Status   Active Line/Drains/Airways    Name:   Placement date:   Placement time:   Site:   Days:   Peripheral IV 08/30/18 Left Antecubital   08/30/18    1951    Antecubital   less than 1   Peripheral IV 08/30/18 Left;Upper Arm   08/30/18    1954    Arm   less than 1          Intake/Output Last 24 hours No intake or output data in the 24 hours ending 08/30/18 2146  Labs/Imaging Results for orders placed or performed during the hospital encounter of 08/30/18 (from the past 48 hour(s))  CBC with Differential     Status: Abnormal   Collection Time: 08/30/18  7:51 PM  Result Value Ref Range   WBC 11.6 (H) 4.0 - 10.5 K/uL   RBC 3.82 (L) 4.22 - 5.81 MIL/uL   Hemoglobin 12.6 (L) 13.0 - 17.0 g/dL   HCT 39.0 39.0 - 52.0 %   MCV 102.1 (H) 80.0 - 100.0 fL   MCH 33.0 26.0 - 34.0 pg   MCHC 32.3 30.0 - 36.0 g/dL   RDW 13.0 11.5 - 15.5 %   Platelets 320 150 - 400 K/uL   nRBC 0.0 0.0 - 0.2 %   Neutrophils Relative % 72 %   Neutro Abs 8.5 (H) 1.7 - 7.7 K/uL  Lymphocytes Relative 17 %   Lymphs Abs 1.9 0.7 - 4.0 K/uL   Monocytes Relative 8 %   Monocytes Absolute 0.9 0.1 - 1.0 K/uL   Eosinophils Relative 1 %   Eosinophils Absolute 0.1 0.0 - 0.5 K/uL   Basophils Relative 1 %   Basophils Absolute 0.1 0.0 - 0.1 K/uL   Immature Granulocytes 1 %   Abs Immature Granulocytes 0.07 0.00 - 0.07 K/uL    Comment: Performed at [facility], [address] [place], [place] [address]  Comprehensive metabolic panel     Status: Abnormal   Collection Time: 08/30/18  7:51 PM  Result Value Ref Range   Sodium 138 135 - 145 mmol/L   Potassium 3.9 3.5 - 5.1 mmol/L   Chloride 107 98 - 111 mmol/L   CO2 22 22 - 32 mmol/L   Glucose, Bld 215 (H) 70 - 99 mg/dL   BUN 23 8 - 23 mg/dL   Creatinine, Ser 1.36 (H) 0.61 - 1.24 mg/dL   Calcium 8.3 (L) 8.9 - 10.3  mg/dL   Total Protein 6.7 6.5 - 8.1 g/dL   Albumin 3.7 3.5 - 5.0 g/dL   AST 21 15 - 41 U/L   ALT 18 0 - 44 U/L   Alkaline Phosphatase 48 38 - 126 U/L   Total Bilirubin 0.6 0.3 - 1.2 mg/dL   GFR calc non Af Amer 48 (L) >60 mL/min   GFR calc Af Amer 56 (L) >60 mL/min   Anion gap 9 5 - 15    Comment: Performed at [facility], [address] [place], [place] [address]  Lipase, blood     Status: None   Collection Time: 08/30/18  7:51 PM  Result Value Ref Range   Lipase 22 11 - 51 U/L    Comment: Performed at [facility], [address] [place], [place] [address]  Type and screen [place]     Status: None   Collection Time: 08/30/18  7:54 PM  Result Value Ref Range   ABO/RH(D) AB POS    Antibody Screen NEG    Sample Expiration      09/02/2018 Performed at [facility], [address] [place], [place] [address]   Lactic acid, plasma     Status: Abnormal   Collection Time: 08/30/18  7:57 PM  Result Value Ref Range   Lactic Acid, Venous 2.7 (HH) 0.5 - 1.9 mmol/L    Comment: CRITICAL RESULT CALLED TO, READ BACK BY AND VERIFIED WITH [facility] Jaymie Mckiddy AT 2029 08/30/2018 SMA Performed at [facility], [address] [place], [place] [address]    No results found.  Pending Labs Unresulted Labs (From admission, onward)    Start     Ordered   08/30/18 2049  C difficile quick scan w PCR reflex  (C Difficile quick screen w PCR reflex panel)  Once, for 24 hours,   STAT     08/30/18 2048   08/30/18 2049  Gastrointestinal Panel by PCR , Stool  (Gastrointestinal Panel by PCR, Stool)  Once,   STAT     08/30/18 2048   08/30/18 2002  Occult blood card to lab, stool  Once,   STAT     08/30/18 2001   08/30/18 1957  Lactic acid, plasma  Now then every 2 hours,   STAT     08/30/18 1956   08/30/18 1951  Urinalysis, Complete w Microscopic  Once,   STAT  08/30/18 1951   Signed and Held  Basic metabolic panel  Tomorrow morning,    R     Signed and Held   Signed and Held  CBC  Tomorrow morning,   R     Signed and Held   Signed and Held  Hemoglobin  Once,   R     Signed and Held          Vitals/Pain Today's Vitals   08/30/18 1933 08/30/18 1934  BP: (!) 151/91   Pulse: (!) 120   Resp: 18   Temp: 97.7 F (36.5 C)   TempSrc: Oral   SpO2: 98%   Weight:  77.1 kg  Height:  5\' 9"  (1.753 m)  PainSc:  0-No pain    Isolation Precautions Enteric precautions (UV disinfection)  Medications Medications  sodium chloride 0.9 % bolus 1,000 mL (0 mLs Intravenous Stopped 08/30/18 2121)  pantoprazole (PROTONIX) injection 40 mg (40 mg Intravenous Given 08/30/18 2117)    Mobility walks Low fall risk   Focused Assessments GI bleed   R Recommendations: See Admitting Provider Note  Report given to:   Additional Notes:

## 2018-08-30 NOTE — H&P (Signed)
[facility] NAME: Clayton [name]    MR#:  [id number]  DATE OF BIRTH:  Aug 19, 1934  DATE OF ADMISSION:  08/30/2018  PRIMARY CARE PHYSICIAN: Juline Patch, MD   REQUESTING/REFERRING PHYSICIAN: Arvella Nigh, [place]  CHIEF COMPLAINT:   Chief Complaint  Patient presents with  . Diarrhea    HISTORY OF PRESENT ILLNESS:  Clayton [name]  is a 84 y.o. male who presents with chief complaint as above.  Patient presents the ED after several days of bloody stool.  He states that about 3 days ago he began having blood in his stool, his stool is very loose.  This was occurring about once a day, until today where he has had 3 large loose bloody stools just this afternoon, including 1 in the ED.  Patient states that he started taking meloxicam about a week and a half ago for lower back pain, and possibly some sciatica.  He has never had any issues like this before with ibuprofen or other NSAIDs.  He is on Plavix for chronic lower extremity DVT.  He is hemoglobin is dropped some since the last value in our system, but it is still relatively good at 12.6.  Patient denies any abdominal pain, nausea, vomiting, fevers, chills, or other symptoms.  Hospitalist were called for admission and further evaluation  PAST MEDICAL HISTORY:   Past Medical History:  Diagnosis Date  . Basal cell carcinoma   . CKD (chronic kidney disease)   . DVT (deep venous thrombosis) ([place])   . HTN (hypertension)      PAST SURGICAL HISTORY:   Past Surgical History:  Procedure Laterality Date  . COLONOSCOPY  2014   cleared for 5 yrs  . CRANIECTOMY FOR DEPRESSED SKULL FRACTURE    . IVC FILTER REMOVAL N/A 07/03/2017   Procedure: IVC FILTER REMOVAL;  Surgeon: Algernon Huxley, MD;  Location: [facility];  Service: Cardiovascular;  Laterality: N/A;  . PERIPHERAL VASCULAR THROMBECTOMY Right 05/22/2017   Procedure: PERIPHERAL VASCULAR THROMBECTOMY;  Surgeon: Algernon Huxley,  MD;  Location: [facility];  Service: Cardiovascular;  Laterality: Right;     SOCIAL HISTORY:   Social History   Tobacco Use  . Smoking status: Former Research scientist (life sciences)  . Smokeless tobacco: Never Used  Substance Use Topics  . Alcohol use: Yes    Alcohol/week: 0.0 standard drinks     FAMILY HISTORY:   Family History  Problem Relation Age of Onset  . Heart disease Father      DRUG ALLERGIES:   Allergies  Allergen Reactions  . Aspirin Rash  . Sulfa Antibiotics Rash    MEDICATIONS AT HOME:   Prior to Admission medications   Medication Sig Start Date End Date Taking? Authorizing Provider  clopidogrel (PLAVIX) 75 MG tablet Take 1 tablet (75 mg total) by mouth daily. 01/02/18  Yes Dew, Erskine Squibb, MD  cyclobenzaprine (FLEXERIL) 10 MG tablet Take 1 tablet (10 mg total) by mouth at bedtime. 08/17/18  Yes Juline Patch, MD  meloxicam (MOBIC) 15 MG tablet Take 1 tablet (15 mg total) by mouth daily. 08/17/18  Yes Juline Patch, MD    REVIEW OF SYSTEMS:  Review of Systems  Constitutional: Negative for chills, fever, malaise/fatigue and weight loss.  HENT: Negative for ear pain, hearing loss and tinnitus.   Eyes: Negative for blurred vision, double vision, pain and redness.  Respiratory: Negative for cough, hemoptysis and shortness of breath.  Cardiovascular: Negative for chest pain, palpitations, orthopnea and leg swelling.  Gastrointestinal: Positive for blood in stool. Negative for abdominal pain, constipation, diarrhea, nausea and vomiting.  Genitourinary: Negative for dysuria, frequency and hematuria.  Musculoskeletal: Negative for back pain, joint pain and neck pain.  Skin:       No acne, rash, or lesions  Neurological: Negative for dizziness, tremors, focal weakness and weakness.  Endo/Heme/Allergies: Negative for polydipsia. Does not bruise/bleed easily.  Psychiatric/Behavioral: Negative for depression. The patient is not nervous/anxious and does not have insomnia.       VITAL SIGNS:   Vitals:   08/30/18 1933 08/30/18 1934  BP: (!) 151/91   Pulse: (!) 120   Resp: 18   Temp: 97.7 F (36.5 C)   TempSrc: Oral   SpO2: 98%   Weight:  77.1 kg  Height:  5\' 9"  (1.753 m)   Wt Readings from Last 3 Encounters:  08/30/18 77.1 kg  08/17/18 80.3 kg  12/19/17 79.4 kg    PHYSICAL EXAMINATION:  Physical Exam  Vitals reviewed. Constitutional: He is oriented to person, place, and time. He appears well-developed and well-nourished. No distress.  HENT:  Head: Normocephalic and atraumatic.  Mouth/Throat: Oropharynx is clear and moist.  Eyes: Pupils are equal, round, and reactive to light. Conjunctivae and EOM are normal. No scleral icterus.  Neck: Normal range of motion. Neck supple. No JVD present. No thyromegaly present.  Cardiovascular: Normal rate, regular rhythm and intact distal pulses. Exam reveals no gallop and no friction rub.  No murmur heard. Respiratory: Effort normal and breath sounds normal. No respiratory distress. He has no wheezes. He has no rales.  GI: Soft. Bowel sounds are normal. He exhibits no distension. There is no abdominal tenderness.  Musculoskeletal: Normal range of motion.        General: No edema.     Comments: No arthritis, no gout  Lymphadenopathy:    He has no cervical adenopathy.  Neurological: He is alert and oriented to person, place, and time. No cranial nerve deficit.  No dysarthria, no aphasia  Skin: Skin is warm and dry. No rash noted. No erythema.  Psychiatric: He has a normal mood and affect. His behavior is normal. Judgment and thought content normal.    LABORATORY PANEL:   CBC Recent Labs  Lab 08/30/18 1951  WBC 11.6*  HGB 12.6*  HCT 39.0  PLT 320   ------------------------------------------------------------------------------------------------------------------  Chemistries  Recent Labs  Lab 08/30/18 1951  NA 138  K 3.9  CL 107  CO2 22  GLUCOSE 215*  BUN 23  CREATININE 1.36*  CALCIUM 8.3*   AST 21  ALT 18  ALKPHOS 48  BILITOT 0.6   ------------------------------------------------------------------------------------------------------------------  Cardiac Enzymes No results for input(s): TROPONINI in the last 168 hours. ------------------------------------------------------------------------------------------------------------------  RADIOLOGY:  No results found.  EKG:   Orders placed or performed during the hospital encounter of 08/30/18  . EKG 12-Lead  . EKG 12-Lead  . ED EKG  . ED EKG    IMPRESSION AND PLAN:  Principal Problem:   Blood in stool -unclear absolute etiology, though it is highly suspicious that this could be due to his meloxicam use.  Still, C. difficile studies and GI panel are pending.  Hemoglobin is still okay, we will trend this serially.  We will have him on IV Protonix for now.  GI consult Active Problems:   Deep vein thrombosis (DVT) of proximal vein of right lower extremity (HCC) -patient finished his course of Eliquis, now  has only chronic DVT, he is status post IVC filter.  Vascular had placed him on maintenance Plavix.  Hold Plavix for tonight   HTN (hypertension) -continue home dose antihypertensives   CKD (chronic kidney disease), stage III (HCC) -at baseline, avoid nephrotoxins and monitor  Chart review performed and case discussed with ED provider. Labs, imaging and/or ECG reviewed by provider and discussed with patient/family. Management plans discussed with the patient and/or family.  DVT PROPHYLAXIS: Mechanical only  GI PROPHYLAXIS:  PPI   ADMISSION STATUS: Inpatient     CODE STATUS: Full  TOTAL TIME TAKING CARE OF THIS PATIENT: 45 minutes.   Ethlyn Daniels 08/30/2018, 9:05 PM  [facility]  Office  [phone]  CC: Primary care physician; Juline Patch, MD  Note:  This document was prepared using Dragon voice recognition software and may include unintentional dictation errors.

## 2018-08-30 NOTE — ED Notes (Signed)
Patient states having dirreaha for a few days patient states having some blood but not to extend of this bm here in ER.

## 2018-08-30 NOTE — ED Triage Notes (Addendum)
Pt to triage via w/c with no distress noted, mask in place; Pt reports diarrhea x 2-3 days; denies any accomp symptoms; has not tried any remedies; rx mobic and flexeril 2wks ago for back pain and unconcerned that this may be related

## 2018-08-30 NOTE — ED Provider Notes (Signed)
[facility] EMERGENCY DEPARTMENT Provider Note   CSN: [id number] Arrival date & time: 08/30/18  1930    History   Chief Complaint Chief Complaint  Patient presents with  . Diarrhea    HPI Clayton [name] is a 84 y.o. male presents to the emergency department via EMS for evaluation of bloody diarrhea.  He said diarrhea for the last 2 to 3 days, 1-2 episodes per day.  Denies any fevers, abdominal pain.  Today patient's had persistent diarrhea for several hours and states it is mostly blood, bright red blood.  Patient denies any chest pain, shortness of breath or abdominal pain.  No dizziness or lightheadedness.  He admits to taking NSAIDs, meloxicam over the last 2 weeks for acute low back pain and sciatica.  Today he denies any back pain states his back pain is resolved.    HPI  Past Medical History:  Diagnosis Date  . Basal cell carcinoma   . CKD (chronic kidney disease)   . DVT (deep venous thrombosis) ([place])   . HTN (hypertension)     Patient Active Problem List   Diagnosis Date Noted  . HTN (hypertension) 08/30/2018  . CKD (chronic kidney disease), stage III ([place]) 08/30/2018  . Blood in stool 08/30/2018  . Deep vein thrombosis (DVT) of proximal vein of right lower extremity ([place]) 06/14/2017    Past Surgical History:  Procedure Laterality Date  . COLONOSCOPY  2014   cleared for 5 yrs  . CRANIECTOMY FOR DEPRESSED SKULL FRACTURE    . IVC FILTER REMOVAL N/A 07/03/2017   Procedure: IVC FILTER REMOVAL;  Surgeon: Algernon Huxley, MD;  Location: [facility];  Service: Cardiovascular;  Laterality: N/A;  . PERIPHERAL VASCULAR THROMBECTOMY Right 05/22/2017   Procedure: PERIPHERAL VASCULAR THROMBECTOMY;  Surgeon: Algernon Huxley, MD;  Location: [facility];  Service: Cardiovascular;  Laterality: Right;        Home Medications    Prior to Admission medications   Medication Sig Start Date End Date Taking? Authorizing Provider  clopidogrel  (PLAVIX) 75 MG tablet Take 1 tablet (75 mg total) by mouth daily. 01/02/18  Yes Dew, Erskine Squibb, MD  cyclobenzaprine (FLEXERIL) 10 MG tablet Take 1 tablet (10 mg total) by mouth at bedtime. 08/17/18  Yes Juline Patch, MD  meloxicam (MOBIC) 15 MG tablet Take 1 tablet (15 mg total) by mouth daily. 08/17/18  Yes Juline Patch, MD    Family History Family History  Problem Relation Age of Onset  . Heart disease Father     Social History Social History   Tobacco Use  . Smoking status: Former Research scientist (life sciences)  . Smokeless tobacco: Never Used  Substance Use Topics  . Alcohol use: Yes    Alcohol/week: 0.0 standard drinks  . Drug use: No     Allergies   Aspirin and Sulfa antibiotics   Review of Systems Review of Systems  Constitutional: Negative for fever.  HENT: Negative for trouble swallowing.   Respiratory: Negative for chest tightness and shortness of breath.   Cardiovascular: Negative for chest pain.  Gastrointestinal: Positive for blood in stool and diarrhea. Negative for abdominal distention, abdominal pain and vomiting.  Neurological: Negative for dizziness, light-headedness and numbness.     Physical Exam Updated Vital Signs BP (!) 151/91 (BP Location: Left Arm)   Pulse (!) 120   Temp 97.7 F (36.5 C) (Oral)   Resp 18   Ht 5\' 9"  (1.753 m)   Wt 77.1  kg   SpO2 98%   BMI 25.10 kg/m   Physical Exam Constitutional:      Appearance: He is well-developed.  HENT:     Head: Normocephalic and atraumatic.  Eyes:     Conjunctiva/sclera: Conjunctivae normal.  Neck:     Musculoskeletal: Normal range of motion.  Cardiovascular:     Rate and Rhythm: Tachycardia present.     Pulses: Normal pulses.     Heart sounds: Normal heart sounds.  Pulmonary:     Effort: Pulmonary effort is normal. No respiratory distress.  Abdominal:     General: Abdomen is flat. Bowel sounds are normal. There is no distension.     Tenderness: There is no abdominal tenderness. There is no guarding.   Musculoskeletal: Normal range of motion.  Skin:    General: Skin is warm.     Findings: No rash.  Neurological:     General: No focal deficit present.     Mental Status: He is alert and oriented to person, place, and time. Mental status is at baseline.  Psychiatric:        Behavior: Behavior normal.        Thought Content: Thought content normal.        Judgment: Judgment normal.      ED Treatments / Results  Labs (all labs ordered are listed, but only abnormal results are displayed) Labs Reviewed  CBC WITH DIFFERENTIAL/PLATELET - Abnormal; Notable for the following components:      Result Value   WBC 11.6 (*)    RBC 3.82 (*)    Hemoglobin 12.6 (*)    MCV 102.1 (*)    Neutro Abs 8.5 (*)    All other components within normal limits  COMPREHENSIVE METABOLIC PANEL - Abnormal; Notable for the following components:   Glucose, Bld 215 (*)    Creatinine, Ser 1.36 (*)    Calcium 8.3 (*)    GFR calc non Af Amer 48 (*)    GFR calc Af Amer 56 (*)    All other components within normal limits  LACTIC ACID, PLASMA - Abnormal; Notable for the following components:   Lactic Acid, Venous 2.7 (*)    All other components within normal limits  C DIFFICILE QUICK SCREEN W PCR REFLEX  GASTROINTESTINAL PANEL BY PCR, STOOL (REPLACES STOOL CULTURE)  LIPASE, BLOOD  URINALYSIS, COMPLETE (UACMP) WITH MICROSCOPIC  LACTIC ACID, PLASMA  OCCULT BLOOD X 1 CARD TO LAB, STOOL  TYPE AND SCREEN    EKG None  Radiology No results found.  Procedures Procedures (including critical care time)  Medications Ordered in ED Medications  pantoprazole (PROTONIX) injection 40 mg (has no administration in time range)  sodium chloride 0.9 % bolus 1,000 mL (1,000 mLs Intravenous New Bag/Given 08/30/18 2001)     Initial Impression / Assessment and Plan / ED Course  I have reviewed the triage vital signs and the nursing notes.  Pertinent labs & imaging results that were available during my care of the  patient were reviewed by me and considered in my medical decision making (see chart for details).        84 year old male presents to the emergency department with 2 to 3-day history of diarrhea.  Over the last few hours she has had persistent bright red bloody diarrhea.  Denies any abdominal pain.  Patient presented tachycardic.  Hemoglobin down to 12.6 from 15.3.  Lactic acid elevated at 2.7.  Patient given Protonix, started on IV fluids.  C. difficile test  and GI panel ordered.  Discussed case with hospitalist who will admit patient for further monitoring  Final Clinical Impressions(s) / ED Diagnoses   Final diagnoses:  Gastrointestinal hemorrhage, unspecified gastrointestinal hemorrhage type    ED Discharge Orders    None       Renata Caprice 08/30/18 2057    Nance Pear, MD 08/30/18 2249

## 2018-08-31 DIAGNOSIS — K921 Melena: Secondary | ICD-10-CM

## 2018-08-31 LAB — CBC
HCT: 28.5 % — ABNORMAL LOW (ref 39.0–52.0)
Hemoglobin: 9.2 g/dL — ABNORMAL LOW (ref 13.0–17.0)
MCH: 33.3 pg (ref 26.0–34.0)
MCHC: 32.3 g/dL (ref 30.0–36.0)
MCV: 103.3 fL — ABNORMAL HIGH (ref 80.0–100.0)
Platelets: 243 10*3/uL (ref 150–400)
RBC: 2.76 MIL/uL — ABNORMAL LOW (ref 4.22–5.81)
RDW: 13 % (ref 11.5–15.5)
WBC: 7.1 10*3/uL (ref 4.0–10.5)
nRBC: 0 % (ref 0.0–0.2)

## 2018-08-31 LAB — IRON AND TIBC
Iron: 52 ug/dL (ref 45–182)
Saturation Ratios: 22 % (ref 17.9–39.5)
TIBC: 234 ug/dL — ABNORMAL LOW (ref 250–450)
UIBC: 182 ug/dL

## 2018-08-31 LAB — HEMOGLOBIN AND HEMATOCRIT, BLOOD
HCT: 28 % — ABNORMAL LOW (ref 39.0–52.0)
Hemoglobin: 9.2 g/dL — ABNORMAL LOW (ref 13.0–17.0)

## 2018-08-31 LAB — URINALYSIS, COMPLETE (UACMP) WITH MICROSCOPIC
Bacteria, UA: NONE SEEN
Bilirubin Urine: NEGATIVE
Glucose, UA: NEGATIVE mg/dL
Hgb urine dipstick: NEGATIVE
Ketones, ur: 5 mg/dL — AB
Leukocytes,Ua: NEGATIVE
Nitrite: NEGATIVE
Protein, ur: NEGATIVE mg/dL
Specific Gravity, Urine: 1.014 (ref 1.005–1.030)
Squamous Epithelial / HPF: NONE SEEN (ref 0–5)
pH: 5 (ref 5.0–8.0)

## 2018-08-31 LAB — BASIC METABOLIC PANEL WITH GFR
Anion gap: 4 — ABNORMAL LOW (ref 5–15)
BUN: 21 mg/dL (ref 8–23)
CO2: 23 mmol/L (ref 22–32)
Calcium: 7.8 mg/dL — ABNORMAL LOW (ref 8.9–10.3)
Chloride: 115 mmol/L — ABNORMAL HIGH (ref 98–111)
Creatinine, Ser: 1.05 mg/dL (ref 0.61–1.24)
GFR calc Af Amer: >60 mL/min
GFR calc non Af Amer: >60 mL/min
Glucose, Bld: 107 mg/dL — ABNORMAL HIGH (ref 70–99)
Potassium: 4.1 mmol/L (ref 3.5–5.1)
Sodium: 142 mmol/L (ref 135–145)

## 2018-08-31 LAB — RETICULOCYTES
Immature Retic Fract: 15.8 % (ref 2.3–15.9)
RBC.: 2.72 MIL/uL — ABNORMAL LOW (ref 4.22–5.81)
Retic Count, Absolute: 30.5 10*3/uL (ref 19.0–186.0)
Retic Ct Pct: 1.1 % (ref 0.4–3.1)

## 2018-08-31 LAB — VITAMIN B12: Vitamin B-12: 81 pg/mL — ABNORMAL LOW (ref 180–914)

## 2018-08-31 LAB — FERRITIN: Ferritin: 71 ng/mL (ref 24–336)

## 2018-08-31 LAB — FOLATE: Folate: 9 ng/mL (ref >5.9)

## 2018-08-31 MED ORDER — SODIUM CHLORIDE 0.9 % IV SOLN
300.0000 mg | Freq: Once | INTRAVENOUS | Status: AC
  Administered 2018-09-01: 300 mg via INTRAVENOUS
  Filled 2018-08-31: qty 15

## 2018-08-31 MED ORDER — PEG 3350-KCL-NA BICARB-NACL 420 G PO SOLR
4000.0000 mL | Freq: Once | ORAL | Status: AC
  Administered 2018-08-31: 4000 mL via ORAL
  Filled 2018-08-31: qty 4000

## 2018-08-31 MED ORDER — MAGNESIUM CITRATE PO SOLN
1.0000 | Freq: Once | ORAL | Status: AC
  Administered 2018-08-31: 1 via ORAL
  Filled 2018-08-31: qty 296

## 2018-08-31 MED ORDER — SODIUM CHLORIDE 0.9 % IV SOLN
510.0000 mg | Freq: Once | INTRAVENOUS | Status: AC
  Administered 2018-08-31: 510 mg via INTRAVENOUS
  Filled 2018-08-31: qty 17

## 2018-08-31 NOTE — Anesthesia Preprocedure Evaluation (Addendum)
Anesthesia Evaluation  Patient identified by MRN, date of birth, ID band Patient awake    Reviewed: Allergy & Precautions, H&P , NPO status , Patient's Chart, lab work & pertinent test results  Airway Mallampati: III  TM Distance: >3 FB     Dental  (+) Chipped   Pulmonary former smoker,           Cardiovascular hypertension, + DVT       Neuro/Psych negative neurological ROS  negative psych ROS   GI/Hepatic negative GI ROS, Neg liver ROS,   Endo/Other  negative endocrine ROS  Renal/GU CRFRenal disease  negative genitourinary   Musculoskeletal   Abdominal   Peds  Hematology negative hematology ROS (+)   Anesthesia Other Findings Past Medical History: No date: Basal cell carcinoma No date: CKD (chronic kidney disease) No date: DVT (deep venous thrombosis) ([facility]) No date: HTN (hypertension)   Reproductive/Obstetrics negative OB ROS                           Anesthesia Physical Anesthesia Plan  ASA: II  Anesthesia Plan: General   Post-op Pain Management:    Induction:   PONV Risk Score and Plan: Propofol infusion and TIVA  Airway Management Planned: Natural Airway and Nasal Cannula  Additional Equipment:   Intra-op Plan:   Post-operative Plan:   Informed Consent: I have reviewed the patients History and Physical, chart, labs and discussed the procedure including the risks, benefits and alternatives for the proposed anesthesia with the patient or authorized representative who has indicated his/her understanding and acceptance.     Dental Advisory Given  Plan Discussed with: Anesthesiologist and CRNA  Anesthesia Plan Comments:         Anesthesia Quick Evaluation

## 2018-08-31 NOTE — Consult Note (Signed)
Cephas Darby, MD [address]  [place]  [place], [place] [address]  Main: [phone]  Fax: [phone] Pager: [phone]   Consultation  Referring Provider:     No ref. provider found Primary Care Physician:  Juline Patch, MD Primary Gastroenterologist: None         Reason for Consultation:     Hematochezia  Date of Admission:  08/30/2018 Date of Consultation:  08/31/2018         HPI:   Clayton [name] is a 84 y.o. male with history of extensive right lower extremity DVT status post thrombectomy, IVC filter placement in 05/2017, previously on Eliquis, status post removal of IVC filter in 06/2017, on Plavix 75 mg daily due to chronic DVT presents with 2 days history of severe hematochezia, painless rectal bleeding.  Patient reports that he developed back pain about 2 weeks ago, was prescribed Mobic and Flexeril by his PCP. On Tuesday night, he said he had a glass of wine, Wednesday morning, he started noticing blood in his stool which increased yesterday.  He describes it as dark red blood not associated with clots.  He felt weak but did not pass out.  He came to ER last night.  Stool studies were performed, negative.  No bleeding scan was performed.  His hemoglobin on presentation was 12.6, decreased to 9.2 today.  Normal BUN/creatinine, normal LFTs, normal platelets, he is started on clear liquid diet, Protonix 40 twice daily.  Patient reports having another episode of rectal bleeding this afternoon, however significantly less volume compared to yesterday.  He felt he saw mostly blood mixed with stool and on wiping.  He denies chest pain, shortness of breath, dizziness, lightheadedness, abdominal pain, nausea or vomiting.  He states his back pain resolved at this time and therefore wanted to have a glass of wine on Tuesday night.  No prior episodes in the past.  Patient tells me that he stopped Plavix about a week ago because he was taking Mobic which can thin his blood as  well.   NSAIDs: Meloxicam for back pain  Antiplts/Anticoagulants/Anti thrombotics: Plavix for DVT  GI Procedures: Colonoscopy in 2012, reportedly normal and he was told he is good for 10 years Never had an EGD before Does not smoke, alcohol occasionally He denies any abdominal surgeries  Past Medical History:  Diagnosis Date  . Basal cell carcinoma   . CKD (chronic kidney disease)   . DVT (deep venous thrombosis) ([place])   . HTN (hypertension)     Past Surgical History:  Procedure Laterality Date  . COLONOSCOPY  2014   cleared for 5 yrs  . CRANIECTOMY FOR DEPRESSED SKULL FRACTURE    . IVC FILTER REMOVAL N/A 07/03/2017   Procedure: IVC FILTER REMOVAL;  Surgeon: Algernon Huxley, MD;  Location: [facility];  Service: Cardiovascular;  Laterality: N/A;  . PERIPHERAL VASCULAR THROMBECTOMY Right 05/22/2017   Procedure: PERIPHERAL VASCULAR THROMBECTOMY;  Surgeon: Algernon Huxley, MD;  Location: [facility];  Service: Cardiovascular;  Laterality: Right;    Prior to Admission medications   Medication Sig Start Date End Date Taking? Authorizing Provider  clopidogrel (PLAVIX) 75 MG tablet Take 1 tablet (75 mg total) by mouth daily. 01/02/18  Yes Dew, Erskine Squibb, MD  cyclobenzaprine (FLEXERIL) 10 MG tablet Take 1 tablet (10 mg total) by mouth at bedtime. 08/17/18  Yes Juline Patch, MD  meloxicam (MOBIC) 15 MG tablet Take 1 tablet (15  mg total) by mouth daily. 08/17/18  Yes Juline Patch, MD    Current Facility-Administered Medications:  .  acetaminophen (TYLENOL) tablet 650 mg, 650 mg, Oral, Q6H PRN **OR** acetaminophen (TYLENOL) suppository 650 mg, 650 mg, Rectal, Q6H PRN, Lance Coon, MD .  ferumoxytol [facility]) 510 mg in sodium chloride 0.9 % 100 mL IVPB, 510 mg, Intravenous, Once, Lyssa Hackley, Tally Due, MD, Last Rate: 468 mL/hr at 08/31/18 1653, 510 mg at 08/31/18 1653 .  [START ON 09/01/2018] iron sucrose (VENOFER) 300 mg in sodium chloride 0.9 % 250 mL IVPB, 300 mg, Intravenous,  Once, Kathryn Cosby, Tally Due, MD .  ondansetron (ZOFRAN) tablet 4 mg, 4 mg, Oral, Q6H PRN **OR** ondansetron (ZOFRAN) injection 4 mg, 4 mg, Intravenous, Q6H PRN, Lance Coon, MD .  pantoprazole (PROTONIX) injection 40 mg, 40 mg, Intravenous, Q12H, Lance Coon, MD, 40 mg at 08/31/18 1101 .  polyethylene glycol-electrolytes (NuLYTELY/GoLYTELY) solution 4,000 mL, 4,000 mL, Oral, Once, Gladyce Mcray, Tally Due, MD  Family History  Problem Relation Age of Onset  . Heart disease Father      Social History   Tobacco Use  . Smoking status: Former Research scientist (life sciences)  . Smokeless tobacco: Never Used  Substance Use Topics  . Alcohol use: Yes    Alcohol/week: 0.0 standard drinks  . Drug use: No    Allergies as of 08/30/2018 - Review Complete 08/30/2018  Allergen Reaction Noted  . Aspirin Rash 06/06/2017  . Sulfa antibiotics Rash 04/10/2015    Review of Systems:    All systems reviewed and negative except where noted in HPI.   Physical Exam:  Vital signs in last 24 hours: Temp:  [97.5 F (36.4 C)-98 F (36.7 C)] 97.5 F (36.4 C) (05/01 1221) Pulse Rate:  [88-120] 88 (05/01 1221) Resp:  [18-19] 19 (05/01 1221) BP: (133-151)/(68-94) 138/68 (05/01 1221) SpO2:  [98 %-100 %] 100 % (05/01 1221) Weight:  [77.1 kg] 77.1 kg (04/30 1934) Last BM Date: 08/30/18 General:   Pleasant, cooperative in NAD Head:  Normocephalic and atraumatic. Eyes:   No icterus.   Conjunctiva pink. PERRLA. Ears:  Normal auditory acuity. Neck:  Supple; no masses or thyroidomegaly Lungs: Respirations even and unlabored. Lungs clear to auscultation bilaterally.   No wheezes, crackles, or rhonchi.  Heart:  Regular rate and rhythm;  Without murmur, clicks, rubs or gallops Abdomen:  Soft, nondistended, nontender. Normal bowel sounds. No appreciable masses or hepatomegaly.  No rebound or guarding.  Rectal:  Not performed. Msk:  Symmetrical without gross deformities.  Strength generalized weakness Extremities:  Without edema, cyanosis  or clubbing. Neurologic:  Alert and oriented x3;  grossly normal neurologically. Skin:  Intact without significant lesions or rashes. Psych:  Alert and cooperative. Normal affect.  LAB RESULTS: CBC Latest Ref Rng & Units 08/31/2018 08/31/2018 08/30/2018  WBC 4.0 - 10.5 K/uL - 7.1 -  Hemoglobin 13.0 - 17.0 g/dL 9.2(L) 9.2(L) 11.2(L)  Hematocrit 39.0 - 52.0 % 28.0(L) 28.5(L) -  Platelets 150 - 400 K/uL - 243 -    BMET BMP Latest Ref Rng & Units 08/31/2018 08/30/2018 08/17/2018  Glucose 70 - 99 mg/dL 107(H) 215(H) 94  BUN 8 - 23 mg/dL _0 Creatinine 0.61 - 1.24 mg/dL 1.05 1.36(H) 1.25  BUN/Creat Ratio 10 - 24 - - 12  Sodium 135 - 145 mmol/L 142 138 140  Potassium 3.5 - 5.1 mmol/L 4.1 3.9 4.5  Chloride 98 - 111 mmol/L 115(H) 107 102  CO2 22 - 32 mmol/L 23 22 21  Calcium 8.9 - 10.3 mg/dL 7.8(L) 8.3(L) 9.3    LFT Hepatic Function Latest Ref Rng & Units 08/30/2018 08/17/2018 05/16/2017  Total Protein 6.5 - 8.1 g/dL 6.7 - -  Albumin 3.5 - 5.0 g/dL 3.7 4.7(H) 4.2  AST 15 - 41 U/L 21 - -  ALT 0 - 44 U/L 18 - -  Alk Phosphatase 38 - 126 U/L 48 - -  Total Bilirubin 0.3 - 1.2 mg/dL 0.6 - -     STUDIES: No results found.    Impression / Plan:   Clayton [name] is a 84 y.o. pleasant Caucasian male with history of DVT, IVC placement status post removal, on Plavix 75 mg for chronic DVT, recently on meloxicam for low back pain presents with painless rectal bleeding.  Most likely a diverticular bleed in setting of antiplatelet therapy.  Rectal bleeding is slowing down at present.  Last dose of Plavix was about a week ago.  Patient is currently hemodynamically stable  Recommend EGD along with colonoscopy as patient has been on chronic antiplatelet therapy and recently on meloxicam although upper GI source is less likely.  Also, etiology of his DVT is unclear Continue Protonix 40 mg twice daily Clear liquid diet Bowel prep tonight N.p.o. past midnight Monitor CBC closely and transfuse as  needed Recommend vascular consult as well as ultrasound Dopplers of the lower extremities to confirm if the DVT has resolved or not prior to restarting long-term antiplatelet therapy  I have discussed alternative options, risks & benefits,  which include, but are not limited to, bleeding, infection, perforation,respiratory complication & drug reaction.  The patient agrees with this plan & written consent will be obtained.    Thank you for involving me in the care of this patient.      LOS: 1 day   Sherri Sear, MD  08/31/2018, 4:57 PM   Note: This dictation was prepared with Dragon dictation along with smaller phrase technology. Any transcriptional errors that result from this process are unintentional.

## 2018-08-31 NOTE — Progress Notes (Signed)
[facility] NAME: Clayton [name]    MR#:  [id number]  DATE OF BIRTH:  07/23/1934  SUBJECTIVE:   no longer with bloody stools this am When he wipes he has some blood no abdominal pain  REVIEW OF SYSTEMS:    Review of Systems  Constitutional: Negative for fever, chills weight loss HENT: Negative for ear pain, nosebleeds, congestion, facial swelling, rhinorrhea, neck pain, neck stiffness and ear discharge.   Respiratory: Negative for cough, shortness of breath, wheezing  Cardiovascular: Negative for chest pain, palpitations and leg swelling.  Gastrointestinal: Negative for heartburn, abdominal pain, vomiting, diarrhea or consitpation Genitourinary: Negative for dysuria, urgency, frequency, hematuria Musculoskeletal: Negative for back pain or joint pain Neurological: Negative for dizziness, seizures, syncope, focal weakness,  numbness and headaches.  Hematological: Does not bruise/bleed easily.  Psychiatric/Behavioral: Negative for hallucinations, confusion, dysphoric mood    Tolerating Diet: npo      DRUG ALLERGIES:   Allergies  Allergen Reactions  . Aspirin Rash  . Sulfa Antibiotics Rash    VITALS:  Blood pressure (!) 133/94, pulse (!) 116, temperature 98 F (36.7 C), temperature source Oral, resp. rate 18, height 5\' 9"  (1.753 m), weight 77.1 kg, SpO2 99 %.  PHYSICAL EXAMINATION:  Constitutional: Appears well-developed and well-nourished. No distress. HENT: Normocephalic. Marland Kitchen Oropharynx is clear and moist.  Eyes: Conjunctivae and EOM are normal. PERRLA, no scleral icterus.  Neck: Normal ROM. Neck supple. No JVD. No tracheal deviation. CVS: RRR, S1/S2 +, no murmurs, no gallops, no carotid bruit.  Pulmonary: Effort and breath sounds normal, no stridor, rhonchi, wheezes, rales.  Abdominal: Soft. BS +,  no distension, tenderness, rebound or guarding.  Musculoskeletal: Normal range of motion. No edema and no tenderness.   Neuro: Alert. CN 2-12 grossly intact. No focal deficits. Skin: Skin is warm and dry. No rash noted. Psychiatric: Normal mood and affect.      LABORATORY PANEL:   CBC Recent Labs  Lab 08/31/18 0517  WBC 7.1  HGB 9.2*  HCT 28.5*  PLT 243   ------------------------------------------------------------------------------------------------------------------  Chemistries  Recent Labs  Lab 08/30/18 1951 08/31/18 0517  NA 138 142  K 3.9 4.1  CL 107 115*  CO2 22 23  GLUCOSE 215* 107*  BUN 23 21  CREATININE 1.36* 1.05  CALCIUM 8.3* 7.8*  AST 21  --   ALT 18  --   ALKPHOS 48  --   BILITOT 0.6  --    ------------------------------------------------------------------------------------------------------------------  Cardiac Enzymes No results for input(s): TROPONINI in the last 168 hours. ------------------------------------------------------------------------------------------------------------------  RADIOLOGY:  No results found.   ASSESSMENT AND PLAN:    84 year old male with history of chronic DVT status post IVC filter currently on Plavix who presented with bright red blood per rectum to the emergency room.  1.  Bright red blood per rectum: Plavix and meloxicam have been discontinued.  Patient will continue PPI.  Case discussed with GI.  Plan for EGD and colonoscopy in a.m.  Clear liquid diet for now.  N.p.o. after midnight.  Follow hemoglobin.  2.  Acute on chronic blood loss anemia due to problem #1: Following CBC. Anemia panel ordered   3.  History of DVT: Patient was on anticoagulation however this is now been discontinued as he is completed treatment.  He has IVC filter.  He is followed by vascular surgery.  He has chronic DVT and therefore is on Plavix as an outpatient.  Plavix has been discontinued for now.  D/w dr Virgil Benedict    Management plans discussed with the patient and he is in agreement.  CODE STATUS: full  TOTAL TIME TAKING CARE OF THIS  PATIENT: 30 minutes.     POSSIBLE D/C 1-2 days, DEPENDING ON CLINICAL CONDITION.   Bettey Costa M.D on 08/31/2018 at 10:31 AM  Between 7am to 6pm - Pager - [phone] After 6pm go to [email or web address] - password [email or web address] [facility]  Office  [phone]  CC: Primary care physician; Juline Patch, MD  Note: This dictation was prepared with Dragon dictation along with smaller phrase technology. Any transcriptional errors that result from this process are unintentional.

## 2018-09-01 ENCOUNTER — Inpatient Hospital Stay: Payer: Medicare Other | Admitting: Anesthesiology

## 2018-09-01 ENCOUNTER — Inpatient Hospital Stay: Payer: Medicare Other

## 2018-09-01 ENCOUNTER — Encounter
Admission: EM | Disposition: A | Discharge status: Other Acute Inpatient Hospital | Payer: Self-pay | Source: Home / Self Care | Attending: Internal Medicine

## 2018-09-01 DIAGNOSIS — K922 Gastrointestinal hemorrhage, unspecified: Secondary | ICD-10-CM

## 2018-09-01 DIAGNOSIS — K219 Gastro-esophageal reflux disease without esophagitis: Secondary | ICD-10-CM

## 2018-09-01 DIAGNOSIS — K3189 Other diseases of stomach and duodenum: Secondary | ICD-10-CM

## 2018-09-01 DIAGNOSIS — K5710 Diverticulosis of small intestine without perforation or abscess without bleeding: Secondary | ICD-10-CM

## 2018-09-01 DIAGNOSIS — K5731 Diverticulosis of large intestine without perforation or abscess with bleeding: Principal | ICD-10-CM

## 2018-09-01 DIAGNOSIS — K449 Diaphragmatic hernia without obstruction or gangrene: Secondary | ICD-10-CM

## 2018-09-01 HISTORY — PX: COLONOSCOPY: SHX5424

## 2018-09-01 HISTORY — PX: ESOPHAGOGASTRODUODENOSCOPY: SHX5428

## 2018-09-01 LAB — CBC
HCT: 26.7 % — ABNORMAL LOW (ref 39.0–52.0)
Hemoglobin: 8.7 g/dL — ABNORMAL LOW (ref 13.0–17.0)
MCH: 33.9 pg (ref 26.0–34.0)
MCHC: 32.6 g/dL (ref 30.0–36.0)
MCV: 103.9 fL — ABNORMAL HIGH (ref 80.0–100.0)
Platelets: 257 10*3/uL (ref 150–400)
RBC: 2.57 MIL/uL — ABNORMAL LOW (ref 4.22–5.81)
RDW: 12.9 % (ref 11.5–15.5)
WBC: 6.5 10*3/uL (ref 4.0–10.5)
nRBC: 0 % (ref 0.0–0.2)

## 2018-09-01 SURGERY — EGD (ESOPHAGOGASTRODUODENOSCOPY)
Anesthesia: General

## 2018-09-01 MED ORDER — CYANOCOBALAMIN 1000 MCG/ML IJ SOLN
1000.0000 ug | Freq: Once | INTRAMUSCULAR | Status: DC
  Filled 2018-09-01: qty 1

## 2018-09-01 MED ORDER — GLYCOPYRROLATE 0.2 MG/ML IJ SOLN
INTRAMUSCULAR | Status: DC | PRN
  Administered 2018-09-01: 0.1 mg via INTRAVENOUS

## 2018-09-01 MED ORDER — PROPOFOL 500 MG/50ML IV EMUL
INTRAVENOUS | Status: DC | PRN
  Administered 2018-09-01: 120 ug/kg/min via INTRAVENOUS

## 2018-09-01 MED ORDER — PHENYLEPHRINE HCL (PRESSORS) 10 MG/ML IV SOLN
INTRAVENOUS | Status: DC | PRN
  Administered 2018-09-01 (×3): 100 ug via INTRAVENOUS
  Administered 2018-09-01: 200 ug via INTRAVENOUS

## 2018-09-01 MED ORDER — SODIUM CHLORIDE 0.9 % IV SOLN
INTRAVENOUS | Status: AC
  Administered 2018-09-01 (×2): via INTRAVENOUS

## 2018-09-01 MED ORDER — PROPOFOL 10 MG/ML IV BOLUS
INTRAVENOUS | Status: DC | PRN
  Administered 2018-09-01: 40 mg via INTRAVENOUS
  Administered 2018-09-01: 30 mg via INTRAVENOUS

## 2018-09-01 MED ORDER — PROPOFOL 10 MG/ML IV BOLUS
INTRAVENOUS | Status: AC
  Filled 2018-09-01: qty 20

## 2018-09-01 MED ORDER — VITAMIN B-12 1000 MCG PO TABS
500.0000 ug | ORAL_TABLET | Freq: Every day | ORAL | Status: DC
  Administered 2018-09-02: 500 ug via ORAL
  Filled 2018-09-01: qty 1

## 2018-09-01 NOTE — Progress Notes (Signed)
MD notified: Would you like to order IV fluids for this patient as he has been NPO and did not have any fluids running overnight.

## 2018-09-01 NOTE — Anesthesia Post-op Follow-up Note (Signed)
Anesthesia QCDR form completed.        

## 2018-09-01 NOTE — Op Note (Signed)
[facility] Gastroenterology Patient Name: Clayton [name] Procedure Date: 09/01/2018 12:37 PM MRN: [id number] Account #: [id number] Date of Birth: 05/31/1934 Admit Type: Inpatient Age: 84 Room: [facility] ENDO ROOM 4 Gender: Male Note Status: Finalized Procedure:            Colonoscopy Indications:          Hematochezia Providers:            Bradey Luzier B. Bonna Gains MD, MD Referring MD:         Juline Patch, MD (Referring MD) Medicines:            Monitored Anesthesia Care Complications:        No immediate complications. Procedure:            Pre-Anesthesia Assessment:                       - Prior to the procedure, a History and Physical was                        performed, and patient medications, allergies and                        sensitivities were reviewed. The patient's tolerance of                        previous anesthesia was reviewed.                       - The risks and benefits of the procedure and the                        sedation options and risks were discussed with the                        patient. All questions were answered and informed                        consent was obtained.                       - Patient identification and proposed procedure were                        verified prior to the procedure by the physician, the                        nurse, the anesthesiologist, the anesthetist and the                        technician. The procedure was verified in the                        pre-procedure area in the procedure room in the                        endoscopy suite.                       - Prophylactic Antibiotics: The patient does not  require prophylactic antibiotics.                       - ASA Grade Assessment: III - A patient with severe                        systemic disease.                       - After reviewing the risks and benefits, the patient                        was deemed in  satisfactory condition to undergo the                        procedure.                       - Monitored anesthesia care was determined to be                        medically necessary for this procedure based on review                        of the patient's medical history, medications, and                        prior anesthesia history.                       - The anesthesia plan was to use monitored anesthesia                        care (MAC).                       After obtaining informed consent, the colonoscope was                        passed under direct vision. Throughout the procedure,                        the patient's blood pressure, pulse, and oxygen                        saturations were monitored continuously. The                        Colonoscope was introduced through the anus and                        advanced to the the cecum, identified by appendiceal                        orifice and ileocecal valve. The colonoscopy was                        performed with ease. The patient tolerated the                        procedure well. The quality of the bowel preparation  was poor. Findings:      Hemorrhoids were found on perianal exam.      Red blood was found in the entire colon. Lavage of the area was       performed using water. Over 1 L of blood and fluid suctioned to improve       visualization., resulting in clearance with fair visualization.      Multiple small and large-mouthed diverticula were found in the sigmoid       colon and ascending colon. There was evidence of recent bleeding from       the diverticular opening. Multiple diverticuli seen in the sigmoid colon       with few also seen in the ascending colon. Blood was seen throughout the       colon, but was more in the sigmoid colon than in any other area of the       colon. The culprit diverticulum could not be identified.      Non-bleeding internal hemorrhoids were found  during retroflexion. Impression:           - Preparation of the colon was poor.                       - Hemorrhoids found on perianal exam.                       - Blood in the entire examined colon.                       - Diverticulosis in the sigmoid colon and in the                        ascending colon. There was evidence of recent bleeding                        from the diverticular opening.                       - Non-bleeding internal hemorrhoids.                       - No specimens collected. Recommendation:       - Do a GI bleeding (tagged RBC) scan today.                       - If bleeding scan is positive, primary team to consult                        Vascular Surgery for embolization. Dr. Tressia Miners                        notified of findings and recommendations.                       - Continue Serial CBCs and transfuse PRN                       - The findings and recommendations were discussed with                        the patient. Procedure Code(s):    --- Professional ---  45378, Colonoscopy, flexible; diagnostic, including                        collection of specimen(s) by brushing or washing, when                        performed (separate procedure) Diagnosis Code(s):    --- Professional ---                       K64.8, Other hemorrhoids                       K57.31, Diverticulosis of large intestine without                        perforation or abscess with bleeding                       K92.2, Gastrointestinal hemorrhage, unspecified                       K92.1, Melena (includes Hematochezia) CPT copyright 2019 American Medical Association. All rights reserved. The codes documented in this report are preliminary and upon coder review may  be revised to meet current compliance requirements.  Vonda Antigua, MD Margretta Sidle B. Bonna Gains MD, MD 09/01/2018 2:06:44 PM This report has been signed electronically. Number of Addenda: 0 Note  Initiated On: 09/01/2018 12:37 PM Scope Withdrawal Time: 0 hours 17 minutes 14 seconds  Total Procedure Duration: 0 hours 27 minutes 36 seconds  Estimated Blood Loss: Estimated blood loss: none.      [facility]

## 2018-09-01 NOTE — Progress Notes (Addendum)
MD notified: The patient had the EGD and colonoscopy done. But still needs to go down to nuclear medicine for bleeding scan. I have tried to get in contact but I have not had any success.  He has just returned from venous ultrasound from lower extremities.   After speaking with the Dr. Tressia Miners. She indicated to place the patient on a clear liquid diet yet if he starts to have loose bloody stools notify the RN in order to call night time hospitalist for STAT bleeding scan.

## 2018-09-01 NOTE — Transfer of Care (Signed)
Immediate Anesthesia Transfer of Care Note  Patient: Marcello Fennel Conroy  Procedure(s) Performed: ESOPHAGOGASTRODUODENOSCOPY (EGD) (N/A ) COLONOSCOPY (N/A )  Patient Location: PACU  Anesthesia Type:General  Level of Consciousness: drowsy  Airway & Oxygen Therapy: Patient Spontanous Breathing and Patient connected to nasal cannula oxygen  Post-op Assessment: Report given to RN and Post -op Vital signs reviewed and stable  Post vital signs: Reviewed and stable  Last Vitals:  Vitals Value Taken Time  BP    Temp    Pulse    Resp    SpO2      Last Pain:  Vitals:   09/01/18 1055  TempSrc: Oral  PainSc:          Complications: No apparent anesthesia complications

## 2018-09-01 NOTE — Plan of Care (Signed)
The patient had a bright red bloody stool. Vitals stable at this time. EGD and colonoscopy obtained. Will go down for RBC scan today and will be checked for LE DVTs.    Problem: Education: Goal: Knowledge of General Education information will improve Description Including pain rating scale, medication(s)/side effects and non-pharmacologic comfort measures Outcome: Progressing   Problem: Health Behavior/Discharge Planning: Goal: Ability to manage health-related needs will improve Outcome: Progressing   Problem: Clinical Measurements: Goal: Ability to maintain clinical measurements within normal limits will improve Outcome: Progressing Goal: Will remain free from infection Outcome: Progressing Goal: Diagnostic test results will improve Outcome: Progressing Goal: Respiratory complications will improve Outcome: Progressing Goal: Cardiovascular complication will be avoided Outcome: Progressing   Problem: Activity: Goal: Risk for activity intolerance will decrease Outcome: Progressing   Problem: Nutrition: Goal: Adequate nutrition will be maintained Outcome: Progressing   Problem: Coping: Goal: Level of anxiety will decrease Outcome: Progressing   Problem: Elimination: Goal: Will not experience complications related to bowel motility Outcome: Progressing Goal: Will not experience complications related to urinary retention Outcome: Progressing   Problem: Pain Managment: Goal: General experience of comfort will improve Outcome: Progressing   Problem: Safety: Goal: Ability to remain free from injury will improve Outcome: Progressing   Problem: Skin Integrity: Goal: Risk for impaired skin integrity will decrease Outcome: Progressing   Problem: Education: Goal: Ability to identify signs and symptoms of gastrointestinal bleeding will improve Outcome: Progressing   Problem: Bowel/Gastric: Goal: Will show no signs and symptoms of gastrointestinal bleeding Outcome:  Progressing   Problem: Fluid Volume: Goal: Will show no signs and symptoms of excessive bleeding Outcome: Progressing   Problem: Clinical Measurements: Goal: Complications related to the disease process, condition or treatment will be avoided or minimized Outcome: Progressing

## 2018-09-01 NOTE — Progress Notes (Addendum)
MD and GI: The patient wanted me to notify you about a bright red liquid bowel movement he just had. The amount was unmeasured. Vital signs stable at this time.

## 2018-09-01 NOTE — Progress Notes (Signed)
[facility] NAME: Clayton [name]    MR#:  [id number]  DATE OF BIRTH:  July 15, 1934  SUBJECTIVE:  CHIEF COMPLAINT:   Chief Complaint  Patient presents with  . Diarrhea   -For EGD and colonoscopy today.  Admitted with bloody stool.  Had one episode of bright red blood in the stool this morning  REVIEW OF SYSTEMS:  Review of Systems  Constitutional: Negative for chills, fever and malaise/fatigue.  HENT: Negative for congestion, ear discharge, hearing loss and nosebleeds.   Eyes: Negative for blurred vision and double vision.  Respiratory: Negative for cough, shortness of breath and wheezing.   Cardiovascular: Negative for chest pain, palpitations and leg swelling.  Gastrointestinal: Positive for blood in stool. Negative for abdominal pain, constipation, diarrhea, nausea and vomiting.  Genitourinary: Negative for dysuria.  Musculoskeletal: Negative for myalgias.  Neurological: Negative for dizziness, focal weakness, seizures, weakness and headaches.  Psychiatric/Behavioral: Negative for depression.    DRUG ALLERGIES:   Allergies  Allergen Reactions  . Aspirin Rash  . Sulfa Antibiotics Rash    VITALS:  Blood pressure (!) 156/87, pulse 81, temperature 98.1 F (36.7 C), temperature source Oral, resp. rate 18, height 5\' 9"  (1.753 m), weight 77.1 kg, SpO2 100 %.  PHYSICAL EXAMINATION:  Physical Exam   GENERAL:  84 y.o.-year-old patient lying in the bed with no acute distress.  EYES: Pupils equal, round, reactive to light and accommodation. No scleral icterus. Extraocular muscles intact.  HEENT: Head atraumatic, normocephalic. Oropharynx and nasopharynx clear.  NECK:  Supple, no jugular venous distention. No thyroid enlargement, no tenderness.  LUNGS: Normal breath sounds bilaterally, no wheezing, rales,rhonchi or crepitation. No use of accessory muscles of respiration.  CARDIOVASCULAR: S1, S2 normal. No  rubs, or gallops.  2/6 systolic murmur present ABDOMEN: Soft, nontender, nondistended. Bowel sounds present. No organomegaly or mass.  EXTREMITIES: No pedal edema, cyanosis, or clubbing.  NEUROLOGIC: Cranial nerves II through XII are intact. Muscle strength 5/5 in all extremities. Sensation intact. Gait not checked.  PSYCHIATRIC: The patient is alert and oriented x 3.  SKIN: No obvious rash, lesion, or ulcer.    LABORATORY PANEL:   CBC Recent Labs  Lab 09/01/18 0328  WBC 6.5  HGB 8.7*  HCT 26.7*  PLT 257   ------------------------------------------------------------------------------------------------------------------  Chemistries  Recent Labs  Lab 08/30/18 1951 08/31/18 0517  NA 138 142  K 3.9 4.1  CL 107 115*  CO2 22 23  GLUCOSE 215* 107*  BUN 23 21  CREATININE 1.36* 1.05  CALCIUM 8.3* 7.8*  AST 21  --   ALT 18  --   ALKPHOS 48  --   BILITOT 0.6  --    ------------------------------------------------------------------------------------------------------------------  Cardiac Enzymes No results for input(s): TROPONINI in the last 168 hours. ------------------------------------------------------------------------------------------------------------------  RADIOLOGY:  No results found.  EKG:   Orders placed or performed during the hospital encounter of 08/30/18  . EKG 12-Lead  . EKG 12-Lead  . ED EKG  . ED EKG    ASSESSMENT AND PLAN:   84 year old male with past medical history significant for right lower extremity DVT status post thrombectomy, IVC filter placement and removal, on Eliquis and Plavix at home presents to hospital secondary to painless rectal bleeding.  1.  Painless lower GI bleed-could be diverticular bleed.  No recent history of colonoscopy and no prior EGD. -Did not receive any transfusions this admission. -Patient stopped his Plavix and Eliquis almost a week ago given his  on and off rectal bleed -Hemoglobin did drop from 11-8.7. -Appreciate GI consult.   For EGD and colonoscopy today  2.  Prior history of DVT-status post IVC placement and removal.  Was on Eliquis at home.  Repeat bilateral lower extremity Dopplers this admission prior to discharge.  Currently anticoagulation is on hold given his GI bleed.  3.  DVT prophylaxis-teds and SCDs only for now  Patient is independent at baseline    All the records are reviewed and case discussed with Care Management/Social Workerr. Management plans discussed with the patient, family and they are in agreement.  CODE STATUS: Full Code  TOTAL TIME TAKING CARE OF THIS PATIENT: 38 minutes.   POSSIBLE D/C IN 2 DAYS, DEPENDING ON CLINICAL CONDITION.   Gladstone Lighter M.D on 09/01/2018 at 11:03 AM  Between 7am to 6pm - Pager - [phone]  After 6pm go to [email or web address] - password [email or web address] [facility]  Office  [phone]  CC: Primary care physician; Juline Patch, MD

## 2018-09-01 NOTE — Anesthesia Procedure Notes (Signed)
Performed by: Aakash Hollomon, CRNA Pre-anesthesia Checklist: Emergency Drugs available, Patient identified, Suction available, Patient being monitored and Timeout performed Patient Re-evaluated:Patient Re-evaluated prior to induction Oxygen Delivery Method: Nasal cannula Induction Type: IV induction       

## 2018-09-01 NOTE — Progress Notes (Signed)
Large 500 ml tap water enema given, returns clear to yellow/tan liquid- no solid stool noted. Pt remains npo, pt aware of tentative time for endoscopies.

## 2018-09-01 NOTE — Op Note (Signed)
[facility] Gastroenterology Patient Name: Clayton [name] Procedure Date: 09/01/2018 12:38 PM MRN: [id number] Account #: [id number] Date of Birth: April 15, 1935 Admit Type: Inpatient Age: 84 Room: [facility] ENDO ROOM 4 Gender: Male Note Status: Finalized Procedure:            Upper GI endoscopy Indications:          Hematochezia Providers:            Kemia Wendel B. Bonna Gains MD, MD Referring MD:         Juline Patch, MD (Referring MD) Medicines:            Monitored Anesthesia Care Complications:        No immediate complications. Procedure:            Pre-Anesthesia Assessment:                       - The risks and benefits of the procedure and the                        sedation options and risks were discussed with the                        patient. All questions were answered and informed                        consent was obtained.                       - Patient identification and proposed procedure were                        verified prior to the procedure.                       - ASA Grade Assessment: III - A patient with severe                        systemic disease.                       After obtaining informed consent, the endoscope was                        passed under direct vision. Throughout the procedure,                        the patient's blood pressure, pulse, and oxygen                        saturations were monitored continuously. The Endoscope                        was introduced through the mouth, and advanced to the                        fourth part of duodenum. The upper GI endoscopy was                        accomplished with ease. The patient tolerated the  procedure well. Findings:      The distal esophagus was mildly tortuous.      A large hiatal hernia was present.      Patchy mildly erythematous mucosa without bleeding was found in the       gastric fundus.      The exam of the stomach was otherwise  normal.      The examined duodenum was normal.      A non-bleeding diverticulum was found in the fourth portion of the       duodenum. Impression:           - Tortuous esophagus.                       - Large hiatal hernia.                       - Erythematous mucosa in the gastric fundus.                       - Normal examined duodenum.                       - Non-bleeding duodenal diverticulum.                       - No specimens collected.                       - No evidence of active or recent bleeding seen                        throughout the exam. Recommendation:       - Perform a colonoscopy today.                       - Continue Serial CBCs and transfuse PRN                       - Use Protonix (pantoprazole) 40 mg PO daily for 4                        weeks.                       - Return to GI clinic in 4 weeks.                       - Return patient to hospital ward for ongoing care.                       - The findings and recommendations were discussed with                        the patient. Procedure Code(s):    --- Professional ---                       [phone], Esophagogastroduodenoscopy, flexible, transoral;                        diagnostic, including collection of specimen(s) by                        brushing or  washing, when performed (separate procedure) Diagnosis Code(s):    --- Professional ---                       Q39.9, Congenital malformation of esophagus, unspecified                       K44.9, Diaphragmatic hernia without obstruction or                        gangrene                       K31.89, Other diseases of stomach and duodenum                       K92.1, Melena (includes Hematochezia)                       K57.10, Diverticulosis of small intestine without                        perforation or abscess without bleeding CPT copyright 2019 American Medical Association. All rights reserved. The codes documented in this report are preliminary and  upon coder review may  be revised to meet current compliance requirements.  Vonda Antigua, MD Margretta Sidle B. Bonna Gains MD, MD 09/01/2018 1:11:31 PM This report has been signed electronically. Number of Addenda: 0 Note Initiated On: 09/01/2018 12:38 PM Estimated Blood Loss: Estimated blood loss: none.      [facility]

## 2018-09-02 ENCOUNTER — Inpatient Hospital Stay: Payer: Medicare Other

## 2018-09-02 ENCOUNTER — Inpatient Hospital Stay (HOSPITAL_COMMUNITY)
Admission: AD | Admit: 2018-09-02 | Discharge: 2018-09-05 | DRG: 378 | Disposition: A | Discharge status: Home / Self Care | Payer: Medicare Other | Source: Other Acute Inpatient Hospital | Attending: Family Medicine | Admitting: Family Medicine

## 2018-09-02 ENCOUNTER — Encounter: Payer: Self-pay | Admitting: Radiology

## 2018-09-02 DIAGNOSIS — I824Y1 Acute embolism and thrombosis of unspecified deep veins of right proximal lower extremity: Secondary | ICD-10-CM | POA: Diagnosis present

## 2018-09-02 DIAGNOSIS — E538 Deficiency of other specified B group vitamins: Secondary | ICD-10-CM | POA: Diagnosis not present

## 2018-09-02 DIAGNOSIS — Z886 Allergy status to analgesic agent status: Secondary | ICD-10-CM | POA: Diagnosis not present

## 2018-09-02 DIAGNOSIS — E876 Hypokalemia: Secondary | ICD-10-CM | POA: Diagnosis present

## 2018-09-02 DIAGNOSIS — Z882 Allergy status to sulfonamides status: Secondary | ICD-10-CM

## 2018-09-02 DIAGNOSIS — G8929 Other chronic pain: Secondary | ICD-10-CM | POA: Diagnosis present

## 2018-09-02 DIAGNOSIS — K5791 Diverticulosis of intestine, part unspecified, without perforation or abscess with bleeding: Secondary | ICD-10-CM | POA: Diagnosis not present

## 2018-09-02 DIAGNOSIS — K922 Gastrointestinal hemorrhage, unspecified: Secondary | ICD-10-CM | POA: Diagnosis not present

## 2018-09-02 DIAGNOSIS — Z8249 Family history of ischemic heart disease and other diseases of the circulatory system: Secondary | ICD-10-CM | POA: Diagnosis not present

## 2018-09-02 DIAGNOSIS — I10 Essential (primary) hypertension: Secondary | ICD-10-CM | POA: Diagnosis present

## 2018-09-02 DIAGNOSIS — I129 Hypertensive chronic kidney disease with stage 1 through stage 4 chronic kidney disease, or unspecified chronic kidney disease: Secondary | ICD-10-CM | POA: Diagnosis present

## 2018-09-02 DIAGNOSIS — D62 Acute posthemorrhagic anemia: Secondary | ICD-10-CM | POA: Diagnosis not present

## 2018-09-02 DIAGNOSIS — I825Y1 Chronic embolism and thrombosis of unspecified deep veins of right proximal lower extremity: Secondary | ICD-10-CM | POA: Diagnosis not present

## 2018-09-02 DIAGNOSIS — N183 Chronic kidney disease, stage 3 unspecified: Secondary | ICD-10-CM | POA: Diagnosis present

## 2018-09-02 DIAGNOSIS — Z85828 Personal history of other malignant neoplasm of skin: Secondary | ICD-10-CM

## 2018-09-02 DIAGNOSIS — K449 Diaphragmatic hernia without obstruction or gangrene: Secondary | ICD-10-CM | POA: Diagnosis not present

## 2018-09-02 DIAGNOSIS — Z86718 Personal history of other venous thrombosis and embolism: Secondary | ICD-10-CM

## 2018-09-02 DIAGNOSIS — K921 Melena: Secondary | ICD-10-CM | POA: Diagnosis not present

## 2018-09-02 DIAGNOSIS — Z79899 Other long term (current) drug therapy: Secondary | ICD-10-CM | POA: Diagnosis not present

## 2018-09-02 DIAGNOSIS — D649 Anemia, unspecified: Secondary | ICD-10-CM | POA: Diagnosis not present

## 2018-09-02 DIAGNOSIS — K5731 Diverticulosis of large intestine without perforation or abscess with bleeding: Principal | ICD-10-CM | POA: Diagnosis present

## 2018-09-02 DIAGNOSIS — I82509 Chronic embolism and thrombosis of unspecified deep veins of unspecified lower extremity: Secondary | ICD-10-CM | POA: Diagnosis not present

## 2018-09-02 DIAGNOSIS — K5790 Diverticulosis of intestine, part unspecified, without perforation or abscess without bleeding: Secondary | ICD-10-CM | POA: Diagnosis not present

## 2018-09-02 DIAGNOSIS — Z87891 Personal history of nicotine dependence: Secondary | ICD-10-CM

## 2018-09-02 HISTORY — DX: Gastrointestinal hemorrhage, unspecified: K92.2

## 2018-09-02 LAB — CBC
HCT: 22.8 % — ABNORMAL LOW (ref 39.0–52.0)
HCT: 29.2 % — ABNORMAL LOW (ref 39.0–52.0)
Hemoglobin: 7.5 g/dL — ABNORMAL LOW (ref 13.0–17.0)
Hemoglobin: 9.7 g/dL — ABNORMAL LOW (ref 13.0–17.0)
MCH: 33.6 pg (ref 26.0–34.0)
MCH: 32.6 pg (ref 26.0–34.0)
MCHC: 32.9 g/dL (ref 30.0–36.0)
MCHC: 33.2 g/dL (ref 30.0–36.0)
MCV: 102.2 fL — ABNORMAL HIGH (ref 80.0–100.0)
MCV: 98 fL (ref 80.0–100.0)
Platelets: 253 10*3/uL (ref 150–400)
Platelets: 284 10*3/uL (ref 150–400)
RBC: 2.23 MIL/uL — ABNORMAL LOW (ref 4.22–5.81)
RBC: 2.98 MIL/uL — ABNORMAL LOW (ref 4.22–5.81)
RDW: 13 % (ref 11.5–15.5)
RDW: 14.6 % (ref 11.5–15.5)
WBC: 9.8 10*3/uL (ref 4.0–10.5)
WBC: 10.4 10*3/uL (ref 4.0–10.5)
nRBC: 0 % (ref 0.0–0.2)
nRBC: 0.2 % (ref 0.0–0.2)

## 2018-09-02 LAB — BASIC METABOLIC PANEL
Anion gap: 7 (ref 5–15)
Anion gap: 12 (ref 5–15)
BUN: 10 mg/dL (ref 8–23)
BUN: 7 mg/dL — ABNORMAL LOW (ref 8–23)
CO2: 22 mmol/L (ref 22–32)
CO2: 20 mmol/L — ABNORMAL LOW (ref 22–32)
Calcium: 7.8 mg/dL — ABNORMAL LOW (ref 8.9–10.3)
Calcium: 8.6 mg/dL — ABNORMAL LOW (ref 8.9–10.3)
Chloride: 113 mmol/L — ABNORMAL HIGH (ref 98–111)
Chloride: 109 mmol/L (ref 98–111)
Creatinine, Ser: 0.83 mg/dL (ref 0.61–1.24)
Creatinine, Ser: 1.08 mg/dL (ref 0.61–1.24)
GFR calc Af Amer: >60 mL/min (ref >60)
GFR calc Af Amer: >60 mL/min (ref >60)
GFR calc non Af Amer: >60 mL/min (ref >60)
GFR calc non Af Amer: >60 mL/min (ref >60)
Glucose, Bld: 95 mg/dL (ref 70–99)
Glucose, Bld: 106 mg/dL — ABNORMAL HIGH (ref 70–99)
Potassium: 3.6 mmol/L (ref 3.5–5.1)
Potassium: 3.4 mmol/L — ABNORMAL LOW (ref 3.5–5.1)
Sodium: 142 mmol/L (ref 135–145)
Sodium: 141 mmol/L (ref 135–145)

## 2018-09-02 LAB — PREPARE RBC (CROSSMATCH)

## 2018-09-02 LAB — HEMOGLOBIN AND HEMATOCRIT, BLOOD
HCT: 24.8 % — ABNORMAL LOW (ref 39.0–52.0)
Hemoglobin: 8.3 g/dL — ABNORMAL LOW (ref 13.0–17.0)

## 2018-09-02 LAB — ABO/RH: ABO/RH(D): AB POS

## 2018-09-02 MED ORDER — PANTOPRAZOLE SODIUM 40 MG IV SOLR: 40.0000 mg | Freq: Two times a day (BID) | INTRAVENOUS | Status: DC

## 2018-09-02 MED ORDER — CYANOCOBALAMIN 500 MCG PO TABS: 500.0000 ug | ORAL_TABLET | Freq: Every day | ORAL | Status: DC

## 2018-09-02 MED ORDER — ONDANSETRON HCL 4 MG PO TABS: 4.0000 mg | ORAL_TABLET | Freq: Four times a day (QID) | ORAL | Status: DC | PRN

## 2018-09-02 MED ORDER — ONDANSETRON HCL 4 MG/2ML IJ SOLN: 4.0000 mg | Freq: Four times a day (QID) | INTRAMUSCULAR | Status: DC | PRN

## 2018-09-02 MED ORDER — SODIUM CHLORIDE 0.9 % IV SOLN
INTRAVENOUS | Status: DC
  Administered 2018-09-02: 09:00:00 via INTRAVENOUS

## 2018-09-02 MED ORDER — SODIUM CHLORIDE 0.9% IV SOLUTION
Freq: Once | INTRAVENOUS | Status: AC
  Administered 2018-09-02: 14:00:00 via INTRAVENOUS

## 2018-09-02 MED ORDER — SODIUM CHLORIDE 0.9 % IV SOLN
INTRAVENOUS | Status: DC
  Administered 2018-09-03: 21:00:00 via INTRAVENOUS
  Administered 2018-09-03: 1000 mL via INTRAVENOUS

## 2018-09-02 MED ORDER — SODIUM CHLORIDE 0.9 % IV SOLN
8.0000 mg/h | INTRAVENOUS | Status: DC
  Administered 2018-09-02 – 2018-09-03 (×2): 8 mg/h via INTRAVENOUS
  Filled 2018-09-02 (×3): qty 80

## 2018-09-02 MED ORDER — TECHNETIUM TC 99M-LABELED RED BLOOD CELLS IV KIT
20.0000 | PACK | Freq: Once | INTRAVENOUS | Status: AC | PRN
  Administered 2018-09-02: 21.027 via INTRAVENOUS

## 2018-09-02 MED ORDER — PANTOPRAZOLE SODIUM 40 MG PO TBEC: 40.0000 mg | DELAYED_RELEASE_TABLET | Freq: Every day | ORAL | Status: DC

## 2018-09-02 MED ORDER — SODIUM CHLORIDE 0.9 % IV SOLN
80.0000 mg | Freq: Once | INTRAVENOUS | Status: AC
  Administered 2018-09-02: 80 mg via INTRAVENOUS
  Filled 2018-09-02: qty 80

## 2018-09-02 MED ORDER — PANTOPRAZOLE SODIUM 40 MG PO TBEC
40.0000 mg | DELAYED_RELEASE_TABLET | Freq: Every day | ORAL | Status: DC
  Administered 2018-09-02: 40 mg via ORAL
  Filled 2018-09-02: qty 1

## 2018-09-02 NOTE — Progress Notes (Signed)
Clayton Antigua, MD [address] [place], [place], [place], [address] [address], [place], [place], [place], [address] Phone: [phone]  Fax: [phone]   Subjective: Patient hemodynamically stable.  Denies any further episodes of bleeding today, however RBC scan was positive this morning   Objective: Exam: Vital signs in last 24 hours: Vitals:   09/02/18 1314 09/02/18 1344 09/02/18 1503 09/02/18 1553  BP: (!) 142/69 (!) 142/71 138/81 (!) 155/79  Pulse: (!) 104 100 87 87  Resp: 18 17 18 16   Temp: 97.8 F (36.6 C) 97.8 F (36.6 C) 97.8 F (36.6 C) 98 F (36.7 C)  TempSrc: Oral Oral Oral Oral  SpO2: 100% 100% 100% 100%  Weight:      Height:       Weight change:   Intake/Output Summary (Last 24 hours) at 09/02/2018 1813 Last data filed at 09/02/2018 1742 Gross per 24 hour  Intake 1223.11 ml  Output 400 ml  Net 823.11 ml    General: No acute distress, AAO x3 Abd: Soft, NT/ND, No HSM Skin: Warm, no rashes Neck: Supple, Trachea midline   Lab Results: Lab Results  Component Value Date   WBC 9.8 09/02/2018   HGB 8.3 (L) 09/02/2018   HCT 24.8 (L) 09/02/2018   MCV 102.2 (H) 09/02/2018   PLT 253 09/02/2018   Micro Results: Recent Results (from the past 240 hour(s))  C difficile quick scan w PCR reflex     Status: None   Collection Time: 08/30/18  7:50 PM  Result Value Ref Range Status   C Diff antigen NEGATIVE NEGATIVE Final   C Diff toxin NEGATIVE NEGATIVE Final   C Diff interpretation No C. difficile detected.  Final    Comment: Performed at [facility], [address] [place], [place] [address]  Gastrointestinal Panel by PCR , Stool     Status: None   Collection Time: 08/30/18  7:50 PM  Result Value Ref Range Status   Campylobacter species NOT DETECTED NOT DETECTED Final   Plesimonas shigelloides NOT DETECTED NOT DETECTED Final   Salmonella species NOT DETECTED NOT DETECTED Final   Yersinia enterocolitica NOT DETECTED NOT DETECTED  Final   Vibrio species NOT DETECTED NOT DETECTED Final   Vibrio cholerae NOT DETECTED NOT DETECTED Final   Enteroaggregative E coli (EAEC) NOT DETECTED NOT DETECTED Final   Enteropathogenic E coli (EPEC) NOT DETECTED NOT DETECTED Final   Enterotoxigenic E coli (ETEC) NOT DETECTED NOT DETECTED Final   Shiga like toxin producing E coli (STEC) NOT DETECTED NOT DETECTED Final   Shigella/Enteroinvasive E coli (EIEC) NOT DETECTED NOT DETECTED Final   Cryptosporidium NOT DETECTED NOT DETECTED Final   Cyclospora cayetanensis NOT DETECTED NOT DETECTED Final   Entamoeba histolytica NOT DETECTED NOT DETECTED Final   Giardia lamblia NOT DETECTED NOT DETECTED Final   Adenovirus F40/41 NOT DETECTED NOT DETECTED Final   Astrovirus NOT DETECTED NOT DETECTED Final   Norovirus GI/GII NOT DETECTED NOT DETECTED Final   Rotavirus A NOT DETECTED NOT DETECTED Final   Sapovirus (I, II, IV, and V) NOT DETECTED NOT DETECTED Final    Comment: Performed at [facility], [address] [place], [place] [address]   Studies/Results: Nm Gi Blood Loss  Addendum Date: 09/02/2018   ADDENDUM REPORT: 09/02/2018 13:58 ADDENDUM: Critical Value/emergent results were called by telephone at the time of interpretation on 09/02/2018 at 1:45 pm to Dr. Tressia Miners, who verbally acknowledged these results. Electronically Signed   By: Ilona Sorrel  M.D.   On: 09/02/2018 13:58   Result Date: 09/02/2018 CLINICAL DATA:  Inpatient. GI bleed with bright red and dark blood per rectum. EXAM: NUCLEAR MEDICINE GASTROINTESTINAL BLEEDING SCAN TECHNIQUE: Sequential abdominal images were obtained following intravenous administration of Tc-43m labeled red blood cells. RADIOPHARMACEUTICALS:  21.0 mCi Tc-67m pertechnetate in-vitro labeled red cells. COMPARISON:  04/30/2007 CT abdomen/pelvis. FINDINGS: During the second hour of imaging, there is curvilinear radiotracer activity in the left upper quadrant of the abdomen, which appears to demonstrate  transit in the expected configuration of splenic flexure of the colon. No additional sites of active GI bleed. IMPRESSION: Findings are compatible with active GI bleed in the left upper quadrant of the abdomen, morphologically correlating with the splenic flexure of the colon. Marked diverticulosis was seen in the left colon on remote 2008 CT abdomen study. Critical Value/emergent results were called by telephone at the time of interpretation on 09/02/2018 at 12:51 pm to RN [facility], who verbally acknowledged these results. We are also trying to contact the ordering provider. Electronically Signed: By: Ilona Sorrel M.D. On: 09/02/2018 13:19   US Venous Img Lower Bilateral  Result Date: 09/01/2018 CLINICAL DATA:  Swelling, pain, GI bleed, history of DVT EXAM: BILATERAL LOWER EXTREMITY VENOUS DOPPLER ULTRASOUND TECHNIQUE: Gray-scale sonography with compression, as well as color and duplex ultrasound, were performed to evaluate the deep venous system from the level of the common femoral vein through the popliteal and proximal calf veins. COMPARISON:  05/16/2017 FINDINGS: On the right, normal compressibility of the common femoral, superficial femoral veins. There is mural hypoechoic material in the distal popliteal vein, with continued flow signal through the lumen on color Doppler. Unremarkable visualized proximal calf veins. On the left, normal compressibility of common femoral, superficial femoral, and popliteal veins. Visualized calf veins unremarkable. No filling defects to suggest DVT on grayscale or color Doppler imaging. Doppler waveforms show normal direction of venous flow, normal respiratory phasicity and response to augmentation. IMPRESSION: 1. Probable post thrombotic change in the distal right popliteal vein. No evidence of acute DVT. 2. On the left, no femoropopliteal and no calf DVT in the visualized calf veins. If clinical symptoms are inconsistent or if there are persistent or worsening  symptoms, further imaging (possibly involving the iliac veins) may be warranted. Electronically Signed   By: Lucrezia Europe M.D.   On: 09/01/2018 17:36   Medications:  Scheduled Meds:  cyanocobalamin  1,000 mcg Intramuscular Once   pantoprazole  40 mg Oral Daily   vitamin B-12  500 mcg Oral Daily   Continuous Infusions:  sodium chloride Stopped (09/02/18 1739)   PRN Meds:.acetaminophen **OR** acetaminophen, ondansetron **OR** ondansetron (ZOFRAN) IV   Assessment: Principal Problem:   Blood in stool Active Problems:   Deep vein thrombosis (DVT) of proximal vein of right lower extremity (HCC)   HTN (hypertension)   CKD (chronic kidney disease), stage III (HCC)   Diverticulosis of colon with hemorrhage   Acute gastrointestinal hemorrhage   Hiatal hernia   Stomach irritation   Diverticulosis of small intestine without hemorrhage    Plan: Patient had drop in hemoglobin this morning, and positive RBC scan today Agree with need for embolization.  Dr. Tressia Miners has discussed this with vascular surgery and IR here and embolization is not available at our facility and thus patient is being transferred for embolization.  Continue serial CBCs and transfuse PRN  Source of bleed is likely diverticular, is consistent with colonoscopy findings and RBC scan  Patient will also need  close outpatient follow-up after discharge and treatment during this admission  High-fiber diet long-term   LOS: 3 days   Clayton Antigua, MD 09/02/2018, 6:13 PM

## 2018-09-02 NOTE — Progress Notes (Signed)
Bleeding scan came positive for left upper flexure of colon. Discussed with on call vascular surgeon here and IR at [facility] who have recommended [facility]. Updated patients wife and also the patient who are in agreement. Waiting to hear  From [facility] hospitalist. Vitals are stable so far

## 2018-09-02 NOTE — Progress Notes (Signed)
Called from [facility] requesting patient transfer.  He is an 84 year old male with past medical history significant for right lower extremity DVT status post thrombectomy, IVC filter placement and removal, on Eliquis and Plavix at home was admitted for painless bloody stools on 4/30.  EGD showed a large hiatal hernia without active bleeding.  Colonoscopy showed extensive blood and diverticulosis with stigmata of recent bleeding from a diverticular opening.  Tagged RBC scan this AM showed bleeding in the left colonic flexure; vascular surgery recommends IR embolization.  Plavix and Eliquis were held over a week ago due to intermittent bleeding.  Hgb dropped to 7.5 and he has been transfused with 1 unit PRBC.    Dr. Tressia Miners called requesting transfer.  Vascular surgery and IR at [facility] say that it can't be done at their facility.  He is on Med Surg floor, hemodynamically stable.  He is being transfused now.  His wife is aware of the transfer.  Dr. Tressia Miners will let IR know that he is coming so that they can prepare for the procedure today.   Carlyon Shadow, M.D.   Please call the Patient Placement RN of [facility] at [phone] when the patient arrives to floor.

## 2018-09-02 NOTE — Progress Notes (Signed)
MD gave verbal orders to get APTT and INR labs with tomorrow mornings lab work. MD gave verbal orders to reorder type and screen. Orders followed. Will continue to monitor.

## 2018-09-02 NOTE — Progress Notes (Addendum)
RN Elita Quick called report to [employer] from [employer] about pt being transfer to room 34. Pt being tranfer with ivs, he is also alert and oriented x4 and in room air.  Pt transported via [facility] to [place].

## 2018-09-02 NOTE — H&P (Addendum)
History and Physical   WILLMAR STOCKINGER [facility]:[id number] DOB: 1934-07-27 DOA: 09/02/2018  Referring MD/NP/PA: Dr. Tressia Miners   PCP: Juline Patch, MD   Outpatient Specialists: None   Patient coming from: home via [facility]  Chief Complaint: GI bleed  HPI: Clayton [name] is a 84 y.o. male with medical history significant of right lower extremity DVT status post thrombectomy and IVC filter placement currently removed on Eliquis and Plavix, chronic kidney disease, basal cell carcinoma, hypertension,Diverticulosis, who was admitted on 08/30/18 with painless GI bleed.  Symptoms really started last week with low back pain patient went to see his PCP and was given medication for the pain.  He took it for almost a week and then had glass of wine.  Patient started feeling bad after that.  He was seen at [facility] where he was found to have rectal bleed.  Patient subsequently had EGD done yesterday.  EGD showed extensive blood in the entire colon, ascending colon and sigmoid colon diverticulosis and stigmata of recent bleeding from a diverticular opening.  GI bleeding scan was done today which was positive for active bleeding in the left colonic flexure.  He has not been taking his Plavix and Eliquis for almost a week.  Hemoglobin dropped from 11g to 7.5 g.  He was transfused 1 unit of packed red blood cells and consultation with GI was sought for.  Recommendation was to transfer patient over here for interventional radiology to have bleeding scan and possible ablation.  Dr. Markus Daft of IR has been consulted and patient is accepted here.  Patient is hemodynamically stable.  Other medical issues include his DVT as well as B12 deficiency..    Review of Systems: As per HPI otherwise 10 point review of systems negative.    Past Medical History:  Diagnosis Date   Basal cell carcinoma    CKD (chronic kidney disease)    DVT (deep venous thrombosis) (HCC)    HTN (hypertension)      Past Surgical History:  Procedure Laterality Date   COLONOSCOPY  2014   cleared for 5 yrs   CRANIECTOMY FOR DEPRESSED SKULL FRACTURE     IVC FILTER REMOVAL N/A 07/03/2017   Procedure: IVC FILTER REMOVAL;  Surgeon: Algernon Huxley, MD;  Location: [facility];  Service: Cardiovascular;  Laterality: N/A;   PERIPHERAL VASCULAR THROMBECTOMY Right 05/22/2017   Procedure: PERIPHERAL VASCULAR THROMBECTOMY;  Surgeon: Algernon Huxley, MD;  Location: [facility];  Service: Cardiovascular;  Laterality: Right;     reports that he has quit smoking. He has never used smokeless tobacco. He reports current alcohol use. He reports that he does not use drugs.  Allergies  Allergen Reactions   Aspirin Rash   Sulfa Antibiotics Rash    Family History  Problem Relation Age of Onset   Heart disease Father      Prior to Admission medications   Medication Sig Start Date End Date Taking? Authorizing Provider  pantoprazole (PROTONIX) 40 MG tablet Take 1 tablet (40 mg total) by mouth daily. 09/03/18   Gladstone Lighter, MD  vitamin B-12 (CYANOCOBALAMIN) 500 MCG tablet Take 1 tablet (500 mcg total) by mouth daily. 09/03/18   Gladstone Lighter, MD    Physical Exam: There were no vitals filed for this visit.  Vitals: Temperature is 97.8 blood pressure 148/80 pulse 82 respirate of 18 oxygen sat 100% room air.    Constitutional: NAD, calm, comfortable There were no vitals filed for this  visit. Eyes: PERRL, lids and conjunctivae normal ENMT: Mucous membranes are moist. Posterior pharynx clear of any exudate or lesions.Normal dentition.  Neck: normal, supple, no masses, no thyromegaly Respiratory: clear to auscultation bilaterally, no wheezing, no crackles. Normal respiratory effort. No accessory muscle use.  Cardiovascular: Regular rate and rhythm, no murmurs / rubs / gallops. No extremity edema. 2+ pedal pulses. No carotid bruits.  Abdomen: Mild tenderness, no masses palpated. No  hepatosplenomegaly. Bowel sounds positive.  Musculoskeletal: no clubbing / cyanosis. No joint deformity upper and lower extremities. Good ROM, no contractures. Normal muscle tone.  Skin: no rashes, lesions, ulcers. No induration Neurologic: CN 2-12 grossly intact. Sensation intact, DTR normal. Strength 5/5 in all 4.  Psychiatric: Normal judgment and insight. Alert and oriented x 3. Normal mood.     Labs on Admission: I have personally reviewed following labs and imaging studies  CBC: Recent Labs  Lab 08/30/18 1951  08/31/18 0517 08/31/18 1444 09/01/18 0328 09/02/18 0357 09/02/18 1633 09/02/18 2037  WBC 11.6*  --  7.1  --  6.5 9.8  --  10.4  NEUTROABS 8.5*  --   --   --   --   --   --   --   HGB 12.6*   < > 9.2* 9.2* 8.7* 7.5* 8.3* 9.7*  HCT 39.0  --  28.5* 28.0* 26.7* 22.8* 24.8* 29.2*  MCV 102.1*  --  103.3*  --  103.9* 102.2*  --  98.0  PLT 320  --  243  --  257 253  --  284   < > = values in this interval not displayed.   Basic Metabolic Panel: Recent Labs  Lab 08/30/18 1951 08/31/18 0517 09/02/18 0357 09/02/18 2037  NA 138 142 142 141  K 3.9 4.1 3.6 3.4*  CL 107 115* 113* 109  CO2 22 23 22  20*  GLUCOSE 215* 107* 95 106*  BUN 23 21 10  7*  CREATININE 1.36* 1.05 0.83 1.08  CALCIUM 8.3* 7.8* 7.8* 8.6*   GFR: Estimated Creatinine Clearance: 53.6 mL/min (by C-G formula based on SCr of 1.08 mg/dL). Liver Function Tests: Recent Labs  Lab 08/30/18 1951  AST 21  ALT 18  ALKPHOS 48  BILITOT 0.6  PROT 6.7  ALBUMIN 3.7   Recent Labs  Lab 08/30/18 1951  LIPASE 22   No results for input(s): AMMONIA in the last 168 hours. Coagulation Profile: No results for input(s): INR, PROTIME in the last 168 hours. Cardiac Enzymes: No results for input(s): CKTOTAL, CKMB, CKMBINDEX, TROPONINI in the last 168 hours. BNP (last 3 results) No results for input(s): PROBNP in the last 8760 hours. HbA1C: No results for input(s): HGBA1C in the last 72 hours. CBG: No results for  input(s): GLUCAP in the last 168 hours. Lipid Profile: No results for input(s): CHOL, HDL, LDLCALC, TRIG, CHOLHDL, LDLDIRECT in the last 72 hours. Thyroid Function Tests: No results for input(s): TSH, T4TOTAL, FREET4, T3FREE, THYROIDAB in the last 72 hours. Anemia Panel: Recent Labs    08/31/18 0517  FOLATE 9.0  FERRITIN 71  TIBC 234*  IRON 52  RETICCTPCT 1.1   Urine analysis:    Component Value Date/Time   COLORURINE STRAW (A) 08/31/2018 0234   APPEARANCEUR CLEAR (A) 08/31/2018 0234   LABSPEC 1.014 08/31/2018 0234   PHURINE 5.0 08/31/2018 0234   GLUCOSEU NEGATIVE 08/31/2018 0234   HGBUR NEGATIVE 08/31/2018 0234   BILIRUBINUR NEGATIVE 08/31/2018 0234   BILIRUBINUR negative 08/17/2018 1412   KETONESUR 5 (A) 08/31/2018  Universal 08/31/2018 0234   UROBILINOGEN 0.2 08/17/2018 1412   NITRITE NEGATIVE 08/31/2018 0234   LEUKOCYTESUR NEGATIVE 08/31/2018 0234   Sepsis Labs: @LABRCNTIP (procalcitonin:4,lacticidven:4) ) Recent Results (from the past 240 hour(s))  C difficile quick scan w PCR reflex     Status: None   Collection Time: 08/30/18  7:50 PM  Result Value Ref Range Status   C Diff antigen NEGATIVE NEGATIVE Final   C Diff toxin NEGATIVE NEGATIVE Final   C Diff interpretation No C. difficile detected.  Final    Comment: Performed at [facility], [address] [place], [place] [address]  Gastrointestinal Panel by PCR , Stool     Status: None   Collection Time: 08/30/18  7:50 PM  Result Value Ref Range Status   Campylobacter species NOT DETECTED NOT DETECTED Final   Plesimonas shigelloides NOT DETECTED NOT DETECTED Final   Salmonella species NOT DETECTED NOT DETECTED Final   Yersinia enterocolitica NOT DETECTED NOT DETECTED Final   Vibrio species NOT DETECTED NOT DETECTED Final   Vibrio cholerae NOT DETECTED NOT DETECTED Final   Enteroaggregative E coli (EAEC) NOT DETECTED NOT DETECTED Final   Enteropathogenic E coli (EPEC) NOT DETECTED NOT  DETECTED Final   Enterotoxigenic E coli (ETEC) NOT DETECTED NOT DETECTED Final   Shiga like toxin producing E coli (STEC) NOT DETECTED NOT DETECTED Final   Shigella/Enteroinvasive E coli (EIEC) NOT DETECTED NOT DETECTED Final   Cryptosporidium NOT DETECTED NOT DETECTED Final   Cyclospora cayetanensis NOT DETECTED NOT DETECTED Final   Entamoeba histolytica NOT DETECTED NOT DETECTED Final   Giardia lamblia NOT DETECTED NOT DETECTED Final   Adenovirus F40/41 NOT DETECTED NOT DETECTED Final   Astrovirus NOT DETECTED NOT DETECTED Final   Norovirus GI/GII NOT DETECTED NOT DETECTED Final   Rotavirus A NOT DETECTED NOT DETECTED Final   Sapovirus (I, II, IV, and V) NOT DETECTED NOT DETECTED Final    Comment: Performed at [facility], [address] [place], [place] [address]     Radiological Exams on Admission: Nm Gi Blood Loss  Addendum Date: 09/02/2018   ADDENDUM REPORT: 09/02/2018 13:58 ADDENDUM: Critical Value/emergent results were called by telephone at the time of interpretation on 09/02/2018 at 1:45 pm to Dr. Tressia Miners, who verbally acknowledged these results. Electronically Signed   By: Ilona Sorrel M.D.   On: 09/02/2018 13:58   Result Date: 09/02/2018 CLINICAL DATA:  Inpatient. GI bleed with bright red and dark blood per rectum. EXAM: NUCLEAR MEDICINE GASTROINTESTINAL BLEEDING SCAN TECHNIQUE: Sequential abdominal images were obtained following intravenous administration of Tc-28m labeled red blood cells. RADIOPHARMACEUTICALS:  21.0 mCi Tc-64m pertechnetate in-vitro labeled red cells. COMPARISON:  04/30/2007 CT abdomen/pelvis. FINDINGS: During the second hour of imaging, there is curvilinear radiotracer activity in the left upper quadrant of the abdomen, which appears to demonstrate transit in the expected configuration of splenic flexure of the colon. No additional sites of active GI bleed. IMPRESSION: Findings are compatible with active GI bleed in the left upper quadrant of the  abdomen, morphologically correlating with the splenic flexure of the colon. Marked diverticulosis was seen in the left colon on remote 2008 CT abdomen study. Critical Value/emergent results were called by telephone at the time of interpretation on 09/02/2018 at 12:51 pm to RN [facility], who verbally acknowledged these results. We are also trying to contact the ordering provider. Electronically Signed: By: Ilona Sorrel M.D. On: 09/02/2018 13:19   US Venous Img Lower Bilateral  Result Date: 09/01/2018 CLINICAL DATA:  Swelling, pain, GI bleed, history of DVT EXAM: BILATERAL LOWER EXTREMITY VENOUS DOPPLER ULTRASOUND TECHNIQUE: Gray-scale sonography with compression, as well as color and duplex ultrasound, were performed to evaluate the deep venous system from the level of the common femoral vein through the popliteal and proximal calf veins. COMPARISON:  05/16/2017 FINDINGS: On the right, normal compressibility of the common femoral, superficial femoral veins. There is mural hypoechoic material in the distal popliteal vein, with continued flow signal through the lumen on color Doppler. Unremarkable visualized proximal calf veins. On the left, normal compressibility of common femoral, superficial femoral, and popliteal veins. Visualized calf veins unremarkable. No filling defects to suggest DVT on grayscale or color Doppler imaging. Doppler waveforms show normal direction of venous flow, normal respiratory phasicity and response to augmentation. IMPRESSION: 1. Probable post thrombotic change in the distal right popliteal vein. No evidence of acute DVT. 2. On the left, no femoropopliteal and no calf DVT in the visualized calf veins. If clinical symptoms are inconsistent or if there are persistent or worsening symptoms, further imaging (possibly involving the iliac veins) may be warranted. Electronically Signed   By: Lucrezia Europe M.D.   On: 09/01/2018 17:36     Assessment/Plan Principal Problem:   Acute  gastrointestinal hemorrhage Active Problems:   Deep vein thrombosis (DVT) of proximal vein of right lower extremity (HCC)   HTN (hypertension)   CKD (chronic kidney disease), stage III (HCC)   Diverticulosis of colon with hemorrhage   GI bleed     #1 painless lower GI bleed: Secondary to diverticular bleed.  Patient has stabilized on arrival here.  Lab work is currently pending.  IR has seen the patient and recommends n.p.o. and watching the patient.  At this point it is more than likely a bleeding scan would not show any active bleed.  Recommendation is for to proceed with catheter embolization in the morning or if patient shows signs of active bleed tonight.  We will continue serial CBCs.  Transfuse more units of packed red blood cells based on his hemoglobin.  #2 hypertension: Hold most blood pressure medications.  Monitor patient's response closely.  #3 chronic kidney disease stage III: Continue to monitor renal function closely.  #4 history of DVT: Off anticoagulation.  Patient previously had IVC filter which has been removed.  SCD will be ordered for the patient   DVT prophylaxis: SCD Code Status: Full code Family Communication: Discussed care with patient and his wife Disposition Plan: Home Consults called: Dr. Markus Daft, vascular and interventional radiology Admission status: Inpatient  Severity of Illness: The appropriate patient status for this patient is INPATIENT. Inpatient status is judged to be reasonable and necessary in order to provide the required intensity of service to ensure the patient's safety. The patient's presenting symptoms, physical exam findings, and initial radiographic and laboratory data in the context of their chronic comorbidities is felt to place them at high risk for further clinical deterioration. Furthermore, it is not anticipated that the patient will be medically stable for discharge from the hospital within 2 midnights of admission. The following  factors support the patient status of inpatient.   " The patient's presenting symptoms include rectal bleed. " The worrisome physical exam findings include mild abdominal discomfort. " The initial radiographic and laboratory data are worrisome because of hemoglobin 7.5 prior to arrival. " The chronic co-morbidities include hypertension and chronic kidney disease stage III.   * I certify that at the point  of admission it is my clinical judgment that the patient will require inpatient hospital care spanning beyond 2 midnights from the point of admission due to high intensity of service, high risk for further deterioration and high frequency of surveillance required.Barbette Merino MD [facility] Pager [phone]  If 7PM-7AM, please contact night-coverage [email or web address] Password [email or web address]  09/02/2018, 11:05 PM

## 2018-09-02 NOTE — Discharge Summary (Signed)
[facility] NAME: Kade Demicco    MR#:  [id number]  DATE OF BIRTH:  1935/02/05  DATE OF ADMISSION:  08/30/2018   ADMITTING PHYSICIAN: Lance Coon, MD  DATE OF DISCHARGE: 09/02/2018  PRIMARY CARE PHYSICIAN: Juline Patch, MD   ADMISSION DIAGNOSIS:   Gastrointestinal hemorrhage, unspecified gastrointestinal hemorrhage type [K92.2]  DISCHARGE DIAGNOSIS:   Principal Problem:   Blood in stool Active Problems:   Deep vein thrombosis (DVT) of proximal vein of right lower extremity (HCC)   HTN (hypertension)   CKD (chronic kidney disease), stage III (HCC)   Diverticulosis of colon with hemorrhage   Acute gastrointestinal hemorrhage   Hiatal hernia   Stomach irritation   Diverticulosis of small intestine without hemorrhage   SECONDARY DIAGNOSIS:   Past Medical History:  Diagnosis Date   Basal cell carcinoma    CKD (chronic kidney disease)    DVT (deep venous thrombosis) (HCC)    HTN (hypertension)     HOSPITAL COURSE:   85 year old male with past medical history significant for right lower extremity DVT status post thrombectomy, IVC filter placement and removal, on Eliquis and Plavix at home presents to hospital secondary to painless rectal bleeding.  1.  Painless lower GI bleed-could be diverticular bleed. - EGD on 09/01/18- showing large hiatal hernia, no active bleeding - colonoscopy showing extensive blood in the entire colon, ascending colon and sigmoid colon diverticulosis and stigmata of recent bleeding from a diverticular opening - GI bleeding scan done this AM and it was positive for active bleeding in the left colonic flexure. -Patient stopped his Plavix and Eliquis almost a week prior to admission given his on and off rectal bleed -Hemoglobin did drop from 11 to 7.5 - 1 units prbc transfusion ordered - coagulation panel pending -Appreciate GI consult.  -patient remains NPO  2. Acute blood loss  anemia- secondary to GI bleeding, hb at 7.5, further impending drop given extensive bleeding in colon - 1 unit PRBC ordered and 2 units to be reserved ahead - monitor closely  3.  Prior history of DVT-status post IVC placement and removal.  Was on Eliquis at home.   - Repeat bilateral lower extremity Dopplers this admission showing post thrombotic changes in right popliteal vein and no evidence of acute DVT, no DVT in the left sided veins. -No anticoagulation currently given GI bleed.  None at discharge given the active bleeding  4.  Severe B12 deficiency-started on intramuscular injections and oral supplements.  Will need initial weekly injections done outpatient PCP follow up.   Discussed with vascular surgeon and IR at [facility] and embolization cannot be done here, so trying to transfer patient to [facility] for IR embolization.  DISCHARGE CONDITIONS:   Guarded  CONSULTS OBTAINED:   Treatment Team:  Lin Landsman, MD  DRUG ALLERGIES:   Allergies  Allergen Reactions   Aspirin Rash   Sulfa Antibiotics Rash   DISCHARGE MEDICATIONS:   Allergies as of 09/02/2018      Reactions   Aspirin Rash   Sulfa Antibiotics Rash      Medication List    STOP taking these medications   clopidogrel 75 MG tablet Commonly known as:  Plavix   cyclobenzaprine 10 MG tablet Commonly known as:  FLEXERIL   meloxicam 15 MG tablet Commonly known as:  MOBIC     TAKE these medications   pantoprazole 40 MG tablet Commonly known as:  PROTONIX Take 1 tablet (40  mg total) by mouth daily. Start taking on:  Sep 03, 2018   vitamin B-12 500 MCG tablet Commonly known as:  CYANOCOBALAMIN Take 1 tablet (500 mcg total) by mouth daily. Start taking on:  Sep 03, 2018        DISCHARGE INSTRUCTIONS:   1. Transfer to [facility] for embolization  DIET:   NPO except ice chips and meds  ACTIVITY:   Bed rest  OXYGEN:   Home Oxygen: No  Oxygen Delivery: Room air  DISCHARGE LOCATION:   [facility]  If you experience worsening of your admission symptoms, develop shortness of breath, life threatening emergency, suicidal or homicidal thoughts you must seek medical attention immediately by calling 911 or calling your MD immediately  if symptoms less severe.  You Must read complete instructions/literature along with all the possible adverse reactions/side effects for all the Medicines you take and that have been prescribed to you. Take any new Medicines after you have completely understood and accpet all the possible adverse reactions/side effects.   Please note  You were cared for by a hospitalist during your hospital stay. If you have any questions about your discharge medications or the care you received while you were in the hospital after you are discharged, you can call the unit and asked to speak with the hospitalist on call if the hospitalist that took care of you is not available. Once you are discharged, your primary care physician will handle any further medical issues. Please note that NO REFILLS for any discharge medications will be authorized once you are discharged, as it is imperative that you return to your primary care physician (or establish a relationship with a primary care physician if you do not have one) for your aftercare needs so that they can reassess your need for medications and monitor your lab values.    On the day of Discharge:  VITAL SIGNS:   Blood pressure (!) 142/71, pulse 100, temperature 97.8 F (36.6 C), temperature source Oral, resp. rate 17, height 5\' 9"  (1.753 m), weight 77.1 kg, SpO2 100 %.  PHYSICAL EXAMINATION:   GENERAL:  84 y.o.-year-old patient lying in the bed with no acute distress.  EYES: Pupils equal, round, reactive to light and accommodation. No scleral icterus. Pale conjunctiva. Extraocular muscles intact.  HEENT: Head atraumatic, normocephalic. Oropharynx and nasopharynx clear.  NECK:  Supple, no jugular venous distention. No  thyroid enlargement, no tenderness.  LUNGS: Normal breath sounds bilaterally, no wheezing, rales,rhonchi or crepitation. No use of accessory muscles of respiration.  CARDIOVASCULAR: S1, S2 normal. No  rubs, or gallops. 2/6 systolic murmur present ABDOMEN: Soft, nontender, nondistended. Bowel sounds present. No organomegaly or mass.  EXTREMITIES: No pedal edema, cyanosis, or clubbing.  NEUROLOGIC: Cranial nerves II through XII are intact. Muscle strength 5/5 in all extremities. Sensation intact. Gait not checked.  PSYCHIATRIC: The patient is alert and oriented x 3.  SKIN: No obvious rash, lesion.  DATA REVIEW:   CBC Recent Labs  Lab 09/02/18 0357  WBC 9.8  HGB 7.5*  HCT 22.8*  PLT 253    Chemistries  Recent Labs  Lab 08/30/18 1951  09/02/18 0357  NA 138   < > 142  K 3.9   < > 3.6  CL 107   < > 113*  CO2 22   < > 22  GLUCOSE 215*   < > 95  BUN 23   < > 10  CREATININE 1.36*   < > 0.83  CALCIUM 8.3*   < >  7.8*  AST 21  --   --   ALT 18  --   --   ALKPHOS 48  --   --   BILITOT 0.6  --   --    < > = values in this interval not displayed.     Microbiology Results  Results for orders placed or performed during the hospital encounter of 08/30/18  C difficile quick scan w PCR reflex     Status: None   Collection Time: 08/30/18  7:50 PM  Result Value Ref Range Status   C Diff antigen NEGATIVE NEGATIVE Final   C Diff toxin NEGATIVE NEGATIVE Final   C Diff interpretation No C. difficile detected.  Final    Comment: Performed at [facility], [address] [place], [place] [address]  Gastrointestinal Panel by PCR , Stool     Status: None   Collection Time: 08/30/18  7:50 PM  Result Value Ref Range Status   Campylobacter species NOT DETECTED NOT DETECTED Final   Plesimonas shigelloides NOT DETECTED NOT DETECTED Final   Salmonella species NOT DETECTED NOT DETECTED Final   Yersinia enterocolitica NOT DETECTED NOT DETECTED Final   Vibrio species NOT DETECTED NOT  DETECTED Final   Vibrio cholerae NOT DETECTED NOT DETECTED Final   Enteroaggregative E coli (EAEC) NOT DETECTED NOT DETECTED Final   Enteropathogenic E coli (EPEC) NOT DETECTED NOT DETECTED Final   Enterotoxigenic E coli (ETEC) NOT DETECTED NOT DETECTED Final   Shiga like toxin producing E coli (STEC) NOT DETECTED NOT DETECTED Final   Shigella/Enteroinvasive E coli (EIEC) NOT DETECTED NOT DETECTED Final   Cryptosporidium NOT DETECTED NOT DETECTED Final   Cyclospora cayetanensis NOT DETECTED NOT DETECTED Final   Entamoeba histolytica NOT DETECTED NOT DETECTED Final   Giardia lamblia NOT DETECTED NOT DETECTED Final   Adenovirus F40/41 NOT DETECTED NOT DETECTED Final   Astrovirus NOT DETECTED NOT DETECTED Final   Norovirus GI/GII NOT DETECTED NOT DETECTED Final   Rotavirus A NOT DETECTED NOT DETECTED Final   Sapovirus (I, II, IV, and V) NOT DETECTED NOT DETECTED Final    Comment: Performed at [facility], [address] [place], [place] [address]    RADIOLOGY:  Nm Gi Blood Loss  Addendum Date: 09/02/2018   ADDENDUM REPORT: 09/02/2018 13:58 ADDENDUM: Critical Value/emergent results were called by telephone at the time of interpretation on 09/02/2018 at 1:45 pm to Dr. Tressia Miners, who verbally acknowledged these results. Electronically Signed   By: Ilona Sorrel M.D.   On: 09/02/2018 13:58   Result Date: 09/02/2018 CLINICAL DATA:  Inpatient. GI bleed with bright red and dark blood per rectum. EXAM: NUCLEAR MEDICINE GASTROINTESTINAL BLEEDING SCAN TECHNIQUE: Sequential abdominal images were obtained following intravenous administration of Tc-52m labeled red blood cells. RADIOPHARMACEUTICALS:  21.0 mCi Tc-76m pertechnetate in-vitro labeled red cells. COMPARISON:  04/30/2007 CT abdomen/pelvis. FINDINGS: During the second hour of imaging, there is curvilinear radiotracer activity in the left upper quadrant of the abdomen, which appears to demonstrate transit in the expected configuration of  splenic flexure of the colon. No additional sites of active GI bleed. IMPRESSION: Findings are compatible with active GI bleed in the left upper quadrant of the abdomen, morphologically correlating with the splenic flexure of the colon. Marked diverticulosis was seen in the left colon on remote 2008 CT abdomen study. Critical Value/emergent results were called by telephone at the time of interpretation on 09/02/2018 at 12:51 pm to RN [facility], who verbally acknowledged these results. We  are also trying to contact the ordering provider. Electronically Signed: By: Ilona Sorrel M.D. On: 09/02/2018 13:19   US Venous Img Lower Bilateral  Result Date: 09/01/2018 CLINICAL DATA:  Swelling, pain, GI bleed, history of DVT EXAM: BILATERAL LOWER EXTREMITY VENOUS DOPPLER ULTRASOUND TECHNIQUE: Gray-scale sonography with compression, as well as color and duplex ultrasound, were performed to evaluate the deep venous system from the level of the common femoral vein through the popliteal and proximal calf veins. COMPARISON:  05/16/2017 FINDINGS: On the right, normal compressibility of the common femoral, superficial femoral veins. There is mural hypoechoic material in the distal popliteal vein, with continued flow signal through the lumen on color Doppler. Unremarkable visualized proximal calf veins. On the left, normal compressibility of common femoral, superficial femoral, and popliteal veins. Visualized calf veins unremarkable. No filling defects to suggest DVT on grayscale or color Doppler imaging. Doppler waveforms show normal direction of venous flow, normal respiratory phasicity and response to augmentation. IMPRESSION: 1. Probable post thrombotic change in the distal right popliteal vein. No evidence of acute DVT. 2. On the left, no femoropopliteal and no calf DVT in the visualized calf veins. If clinical symptoms are inconsistent or if there are persistent or worsening symptoms, further imaging (possibly involving  the iliac veins) may be warranted. Electronically Signed   By: Lucrezia Europe M.D.   On: 09/01/2018 17:36     Management plans discussed with the patient, family and they are in agreement.  CODE STATUS:     Code Status Orders  (From admission, onward)         Start     Ordered   08/30/18 2227  Full code  Continuous     08/30/18 2226        Code Status History    This patient has a current code status but no historical code status.      TOTAL TIME TAKING CARE OF THIS PATIENT: 38 minutes.    Gladstone Lighter M.D on 09/02/2018 at 2:23 PM  Between 7am to 6pm - Pager - [phone]  After 6pm go to [email or web address] - Proofreader  [facility]  Office  [phone]  CC: Primary care physician; Juline Patch, MD   Note: This dictation was prepared with Dragon dictation along with smaller phrase technology. Any transcriptional errors that result from this process are unintentional.

## 2018-09-02 NOTE — Consult Note (Signed)
Chief Complaint: Patient was seen in consultation today for GI bleed   Referring Physician(s): Dr. Jonelle Sidle  Patient Status: [name] - In-pt  History of Present Illness: Clayton [name] is a 84 y.o. male with painless lower GI bleeding.  Patient was an inpatient at [facility] earlier today.  He underwent endoscopy and colonoscopy on 5/2 that demonstrated diverticula but no active bleeding.  Nuclear medicine tagged RBC scan was positive for bleeding this morning and localized the bleeding to left colon and splenic flexure area.  Patient was transferred to [facility] for potentially catheter embolization procedure.  Patient has history of RLE DVT and recently stopped Plavix.  In addition, he started Meloxicam recently for back pain.  Hemoglobin dropped from 12.6 to 7.5 at [facility] and he received packed RBCs today.  Currently, patient feels good and has no complaints.  He is sitting up in bed without distress.  No bowel movements today.     Past Medical History:  Diagnosis Date   Basal cell carcinoma    CKD (chronic kidney disease)    DVT (deep venous thrombosis) (HCC)    HTN (hypertension)     Past Surgical History:  Procedure Laterality Date   COLONOSCOPY  2014   cleared for 5 yrs   CRANIECTOMY FOR DEPRESSED SKULL FRACTURE     IVC FILTER REMOVAL N/A 07/03/2017   Procedure: IVC FILTER REMOVAL;  Surgeon: Algernon Huxley, MD;  Location: [place] [facility];  Service: Cardiovascular;  Laterality: N/A;   PERIPHERAL VASCULAR THROMBECTOMY Right 05/22/2017   Procedure: PERIPHERAL VASCULAR THROMBECTOMY;  Surgeon: Algernon Huxley, MD;  Location: [place] [facility];  Service: Cardiovascular;  Laterality: Right;    Allergies: Aspirin and Sulfa antibiotics  Medications: Prior to Admission medications   Medication Sig Start Date End Date Taking? Authorizing Provider  pantoprazole (PROTONIX) 40 MG tablet Take 1 tablet (40 mg total) by mouth daily. 09/03/18   Gladstone Lighter, MD    vitamin B-12 (CYANOCOBALAMIN) 500 MCG tablet Take 1 tablet (500 mcg total) by mouth daily. 09/03/18   Gladstone Lighter, MD     Family History  Problem Relation Age of Onset   Heart disease Father     Social History   Socioeconomic History   Marital status: Married    Spouse name: Not on file   Number of children: Not on file   Years of education: Not on file   Highest education level: Not on file  Occupational History   Not on file  Social Needs   Financial resource strain: Not on file   Food insecurity:    Worry: Not on file    Inability: Not on file   Transportation needs:    Medical: Not on file    Non-medical: Not on file  Tobacco Use   Smoking status: Former Smoker   Smokeless tobacco: Never Used  Substance and Sexual Activity   Alcohol use: Yes    Alcohol/week: 0.0 standard drinks   Drug use: No   Sexual activity: Not Currently  Lifestyle   Physical activity:    Days per week: Not on file    Minutes per session: Not on file   Stress: Not on file  Relationships   Social connections:    Talks on phone: Not on file    Gets together: Not on file    Attends religious service: Not on file    Active member of club or organization: Not on file    Attends meetings  of clubs or organizations: Not on file    Relationship status: Not on file  Other Topics Concern   Not on file  Social History Narrative   Not on file    Review of Systems  Gastrointestinal: Positive for blood in stool and diarrhea.    Vital Signs: There were no vitals taken for this visit.  Physical Exam Constitutional:      General: He is not in acute distress.    Appearance: Normal appearance. He is not ill-appearing.  Cardiovascular:     Rate and Rhythm: Normal rate and regular rhythm.     Pulses: Normal pulses.     Heart sounds: Normal heart sounds.  Pulmonary:     Effort: Pulmonary effort is normal.     Breath sounds: Normal breath sounds.  Abdominal:      General: Abdomen is flat.     Palpations: Abdomen is soft.     Tenderness: There is abdominal tenderness.     Comments: Mild tenderness in right abdomen with palpation.    Musculoskeletal:        General: No swelling.     Right lower leg: No edema.     Left lower leg: No edema.  Neurological:     Mental Status: He is alert.     Imaging: Dg Lumbar Spine Complete  Result Date: 08/18/2018 CLINICAL DATA:  Low back pain.  No trauma. EXAM: LUMBAR SPINE - COMPLETE 4+ VIEW COMPARISON:  None. FINDINGS: Normal alignment of lumbar vertebral bodies. No loss of vertebral body height or disc height. No pars fracture. No subluxation. Degenerate spurring from L1-L3. Endplate sclerosis superiorly at L3. No subluxation. Atherosclerotic calcification of the aorta. IMPRESSION: 1. No acute findings of the lumbar spine. 2. Mild to moderate disc osteophytic disease. 3.  Aortic Atherosclerosis (ICD10-I70.0). Electronically Signed   By: Suzy Bouchard M.D.   On: 08/18/2018 11:03   Nm Gi Blood Loss  Addendum Date: 09/02/2018   ADDENDUM REPORT: 09/02/2018 13:58 ADDENDUM: Critical Value/emergent results were called by telephone at the time of interpretation on 09/02/2018 at 1:45 pm to Dr. Tressia Miners, who verbally acknowledged these results. Electronically Signed   By: Ilona Sorrel M.D.   On: 09/02/2018 13:58   Result Date: 09/02/2018 CLINICAL DATA:  Inpatient. GI bleed with bright red and dark blood per rectum. EXAM: NUCLEAR MEDICINE GASTROINTESTINAL BLEEDING SCAN TECHNIQUE: Sequential abdominal images were obtained following intravenous administration of Tc-65m labeled red blood cells. RADIOPHARMACEUTICALS:  21.0 mCi Tc-72m pertechnetate in-vitro labeled red cells. COMPARISON:  04/30/2007 CT abdomen/pelvis. FINDINGS: During the second hour of imaging, there is curvilinear radiotracer activity in the left upper quadrant of the abdomen, which appears to demonstrate transit in the expected configuration of splenic flexure of  the colon. No additional sites of active GI bleed. IMPRESSION: Findings are compatible with active GI bleed in the left upper quadrant of the abdomen, morphologically correlating with the splenic flexure of the colon. Marked diverticulosis was seen in the left colon on remote 2008 CT abdomen study. Critical Value/emergent results were called by telephone at the time of interpretation on 09/02/2018 at 12:51 pm to RN [facility], who verbally acknowledged these results. We are also trying to contact the ordering provider. Electronically Signed: By: Ilona Sorrel M.D. On: 09/02/2018 13:19   US Venous Img Lower Bilateral  Result Date: 09/01/2018 CLINICAL DATA:  Swelling, pain, GI bleed, history of DVT EXAM: BILATERAL LOWER EXTREMITY VENOUS DOPPLER ULTRASOUND TECHNIQUE: Gray-scale sonography with compression, as well as color and  duplex ultrasound, were performed to evaluate the deep venous system from the level of the common femoral vein through the popliteal and proximal calf veins. COMPARISON:  05/16/2017 FINDINGS: On the right, normal compressibility of the common femoral, superficial femoral veins. There is mural hypoechoic material in the distal popliteal vein, with continued flow signal through the lumen on color Doppler. Unremarkable visualized proximal calf veins. On the left, normal compressibility of common femoral, superficial femoral, and popliteal veins. Visualized calf veins unremarkable. No filling defects to suggest DVT on grayscale or color Doppler imaging. Doppler waveforms show normal direction of venous flow, normal respiratory phasicity and response to augmentation. IMPRESSION: 1. Probable post thrombotic change in the distal right popliteal vein. No evidence of acute DVT. 2. On the left, no femoropopliteal and no calf DVT in the visualized calf veins. If clinical symptoms are inconsistent or if there are persistent or worsening symptoms, further imaging (possibly involving the iliac veins) may  be warranted. Electronically Signed   By: Lucrezia Europe M.D.   On: 09/01/2018 17:36    Labs:  CBC: Recent Labs    08/30/18 1951  08/31/18 0517 08/31/18 1444 09/01/18 0328 09/02/18 0357 09/02/18 1633  WBC 11.6*  --  7.1  --  6.5 9.8  --   HGB 12.6*   < > 9.2* 9.2* 8.7* 7.5* 8.3*  HCT 39.0  --  28.5* 28.0* 26.7* 22.8* 24.8*  PLT 320  --  243  --  257 253  --    < > = values in this interval not displayed.    COAGS: No results for input(s): INR, APTT in the last 8760 hours.  BMP: Recent Labs    08/17/18 1417 08/30/18 1951 08/31/18 0517 09/02/18 0357  NA 140 138 142 142  K 4.5 3.9 4.1 3.6  CL 102 107 115* 113*  CO2 21 22 23 22   GLUCOSE 94 215* 107* 95  BUN 15 23 21 10   CALCIUM 9.3 8.3* 7.8* 7.8*  CREATININE 1.25 1.36* 1.05 0.83  GFRNONAA 54* 48* >60 >60  GFRAA 62 56* >60 >60    LIVER FUNCTION TESTS: Recent Labs    08/17/18 1417 08/30/18 1951  BILITOT  --  0.6  AST  --  21  ALT  --  18  ALKPHOS  --  48  PROT  --  6.7  ALBUMIN 4.7* 3.7    TUMOR MARKERS: No results for input(s): AFPTM, CEA, CA199, CHROMGRNA in the last 8760 hours.  Assessment and Plan:  84 yo with lower GI bleeding, presumably from colonic diverticular disease.  Recent tagged RBC bleeding scan was positive and demonstrated bleeding from left colon/splenic flexure area.  The nuclear medicine study was earlier this morning and no evidence of bloody stools today.  Patient is hemodynamically stable and feels good.  Based on his appearance, I doubt he is having active GI bleeding at this time.  Explained to patient and wife that diverticular bleeding can be intermittent and we can only treat the bleeding with angiography if he is actively bleeding.  We discussed the mesenteric arteriogram and possible embolization.  I believe the patient has a good understanding of the risks and benefits.  Risks include but not limited to bleeding, vascular injury, bowel ischemia and negative arteriogram.   Based on how  Mr. Messineo looks at this time, I think a mesenteric arteriogram would be negative for active bleeding.  Therefore, we discussed continued observation and monitoring of his hemoglobin.  If he has an  another episode of lower GI bleeding or if his hemoglobin continues to decrease, then we will proceed with a mesenteric arteriogram and embolization.  Discussed with Dr. Jonelle Sidle and he agrees with the management plan.      Thank you for this interesting consult.  I greatly enjoyed meeting [employer] and look forward to participating in their care.  A copy of this report was sent to the requesting provider on this date.  Electronically Signed: Burman Riis, MD 09/02/2018, 8:25 PM   I spent a total of 40 Minutes  in face to face in clinical consultation, greater than 50% of which was counseling/coordinating care for GI bleeding and potential embolization.

## 2018-09-02 NOTE — Progress Notes (Signed)
[facility] [place] NAME: Clayton [name]    MR#:  [id number]  DATE OF BIRTH:  July 01, 1934  SUBJECTIVE:  CHIEF COMPLAINT:   Chief Complaint  Patient presents with  . Diarrhea   - colonoscopy done 09/01/18 showing extensive blood in the colon and diverticulosis - for bleeding scan today, hb low- 1 unit blood ordered  REVIEW OF SYSTEMS:  Review of Systems  Constitutional: Negative for chills, fever and malaise/fatigue.  HENT: Negative for congestion, ear discharge, hearing loss and nosebleeds.   Eyes: Negative for blurred vision and double vision.  Respiratory: Negative for cough, shortness of breath and wheezing.   Cardiovascular: Negative for chest pain, palpitations and leg swelling.  Gastrointestinal: Positive for blood in stool. Negative for abdominal pain, constipation, diarrhea, nausea and vomiting.  Genitourinary: Negative for dysuria.  Musculoskeletal: Negative for myalgias.  Neurological: Negative for dizziness, focal weakness, seizures, weakness and headaches.  Psychiatric/Behavioral: Negative for depression.    DRUG ALLERGIES:   Allergies  Allergen Reactions  . Aspirin Rash  . Sulfa Antibiotics Rash    VITALS:  Blood pressure (!) 142/80, pulse 100, temperature 97.9 F (36.6 C), temperature source Oral, resp. rate 19, height 5\' 9"  (1.753 m), weight 77.1 kg, SpO2 99 %.  PHYSICAL EXAMINATION:  Physical Exam   GENERAL:  84 y.o.-year-old patient lying in the bed with no acute distress.  EYES: Pupils equal, round, reactive to light and accommodation. No scleral icterus. Pale conjunctiva. Extraocular muscles intact.  HEENT: Head atraumatic, normocephalic. Oropharynx and nasopharynx clear.  NECK:  Supple, no jugular venous distention. No thyroid enlargement, no tenderness.  LUNGS: Normal breath sounds bilaterally, no wheezing, rales,rhonchi or crepitation. No use of accessory muscles of respiration.  CARDIOVASCULAR: S1,  S2 normal. No  rubs, or gallops. 2/6 systolic murmur present ABDOMEN: Soft, nontender, nondistended. Bowel sounds present. No organomegaly or mass.  EXTREMITIES: No pedal edema, cyanosis, or clubbing.  NEUROLOGIC: Cranial nerves II through XII are intact. Muscle strength 5/5 in all extremities. Sensation intact. Gait not checked.  PSYCHIATRIC: The patient is alert and oriented x 3.  SKIN: No obvious rash, lesion, or ulcer.    LABORATORY PANEL:   CBC Recent Labs  Lab 09/02/18 0357  WBC 9.8  HGB 7.5*  HCT 22.8*  PLT 253   ------------------------------------------------------------------------------------------------------------------  Chemistries  Recent Labs  Lab 08/30/18 1951  09/02/18 0357  NA 138   < > 142  K 3.9   < > 3.6  CL 107   < > 113*  CO2 22   < > 22  GLUCOSE 215*   < > 95  BUN 23   < > 10  CREATININE 1.36*   < > 0.83  CALCIUM 8.3*   < > 7.8*  AST 21  --   --   ALT 18  --   --   ALKPHOS 48  --   --   BILITOT 0.6  --   --    < > = values in this interval not displayed.   ------------------------------------------------------------------------------------------------------------------  Cardiac Enzymes No results for input(s): TROPONINI in the last 168 hours. ------------------------------------------------------------------------------------------------------------------  RADIOLOGY:  US Venous Img Lower Bilateral  Result Date: 09/01/2018 CLINICAL DATA:  Swelling, pain, GI bleed, history of DVT EXAM: BILATERAL LOWER EXTREMITY VENOUS DOPPLER ULTRASOUND TECHNIQUE: Gray-scale sonography with compression, as well as color and duplex ultrasound, were performed to evaluate the deep venous system from the level of the common femoral vein through the  popliteal and proximal calf veins. COMPARISON:  05/16/2017 FINDINGS: On the right, normal compressibility of the common femoral, superficial femoral veins. There is mural hypoechoic material in the distal popliteal vein,  with continued flow signal through the lumen on color Doppler. Unremarkable visualized proximal calf veins. On the left, normal compressibility of common femoral, superficial femoral, and popliteal veins. Visualized calf veins unremarkable. No filling defects to suggest DVT on grayscale or color Doppler imaging. Doppler waveforms show normal direction of venous flow, normal respiratory phasicity and response to augmentation. IMPRESSION: 1. Probable post thrombotic change in the distal right popliteal vein. No evidence of acute DVT. 2. On the left, no femoropopliteal and no calf DVT in the visualized calf veins. If clinical symptoms are inconsistent or if there are persistent or worsening symptoms, further imaging (possibly involving the iliac veins) may be warranted. Electronically Signed   By: Lucrezia Europe M.D.   On: 09/01/2018 17:36    EKG:   Orders placed or performed during the hospital encounter of 08/30/18  . EKG 12-Lead  . EKG 12-Lead  . ED EKG  . ED EKG    ASSESSMENT AND PLAN:   84 year old male with past medical history significant for right lower extremity DVT status post thrombectomy, IVC filter placement and removal, on Eliquis and Plavix at home presents to hospital secondary to painless rectal bleeding.  1.  Painless lower GI bleed-could be diverticular bleed. - EGD on 09/01/18- showing large hiatal hernia, no active bleeding - colonoscopy showing extensive blood in the entire colon, ascending colon and sigmoid colon diverticulosis and stigmata of recent bleeding from a diverticular opening - GI bleeding scan ordered- to be done this AM- If positive, consult vascular surgery -Patient stopped his Plavix and Eliquis almost a week ago given his on and off rectal bleed -Hemoglobin did drop from 11 to 7.5 today -Appreciate GI consult.   2. Acute blood loss anemia- secondary to GI bleeding, hb at 7.5, further impending drop given extensive bleeding in colon - 1 unit PRBC ordered and 2  units to be reserved ahead - monitor closely  3.  Prior history of DVT-status post IVC placement and removal.  Was on Eliquis at home.   - Repeat bilateral lower extremity Dopplers this admission showing post thrombotic changes in right popliteal vein and no evidence of acute DVT, no DVT in the left sided veins. -No anticoagulation currently given GI bleed.  None at discharge given the active bleeding  4.  Severe B12 deficiency-started on intramuscular injections and oral supplements.  Will need initial weekly injections done outpatient PCP follow up.  5.  DVT prophylaxis-teds and SCDs only for now  Patient is independent at baseline    All the records are reviewed and case discussed with Care Management/Social Workerr. Management plans discussed with the patient, family and they are in agreement.  CODE STATUS: Full Code  TOTAL TIME TAKING CARE OF THIS PATIENT: 38 minutes.   POSSIBLE D/C IN 2 DAYS, DEPENDING ON CLINICAL CONDITION.   Gladstone Lighter M.D on 09/02/2018 at 8:41 AM  Between 7am to 6pm - Pager - [phone]  After 6pm go to [email or web address] - password [email or web address] [facility]  Office  [phone]  CC: Primary care physician; Juline Patch, MD

## 2018-09-03 ENCOUNTER — Ambulatory Visit: Payer: Medicare Other | Admitting: Family Medicine

## 2018-09-03 DIAGNOSIS — N183 Chronic kidney disease, stage 3 (moderate): Secondary | ICD-10-CM

## 2018-09-03 DIAGNOSIS — K5791 Diverticulosis of intestine, part unspecified, without perforation or abscess with bleeding: Secondary | ICD-10-CM

## 2018-09-03 DIAGNOSIS — I10 Essential (primary) hypertension: Secondary | ICD-10-CM

## 2018-09-03 DIAGNOSIS — I825Y1 Chronic embolism and thrombosis of unspecified deep veins of right proximal lower extremity: Secondary | ICD-10-CM

## 2018-09-03 DIAGNOSIS — D649 Anemia, unspecified: Secondary | ICD-10-CM

## 2018-09-03 HISTORY — DX: Anemia, unspecified: D64.9

## 2018-09-03 LAB — TYPE AND SCREEN
ABO/RH(D): AB POS
Antibody Screen: NEGATIVE
Unit division: 0
Unit division: 0
Unit division: 0

## 2018-09-03 LAB — BPAM RBC
Blood Product Expiration Date: 202005062359
Blood Product Expiration Date: 202005062359
Blood Product Expiration Date: 202005082359
ISSUE DATE / TIME: 202005031322
Unit Type and Rh: 6200
Unit Type and Rh: 6200
Unit Type and Rh: 6200

## 2018-09-03 LAB — HEMOGLOBIN AND HEMATOCRIT, BLOOD
HCT: 24.7 % — ABNORMAL LOW (ref 39.0–52.0)
HCT: 25 % — ABNORMAL LOW (ref 39.0–52.0)
HCT: 24.9 % — ABNORMAL LOW (ref 39.0–52.0)
Hemoglobin: 8.3 g/dL — ABNORMAL LOW (ref 13.0–17.0)
Hemoglobin: 8.5 g/dL — ABNORMAL LOW (ref 13.0–17.0)
Hemoglobin: 8.3 g/dL — ABNORMAL LOW (ref 13.0–17.0)

## 2018-09-03 MED ORDER — POTASSIUM CHLORIDE 10 MEQ/100ML IV SOLN
10.0000 meq | INTRAVENOUS | Status: AC
  Administered 2018-09-03 (×3): 10 meq via INTRAVENOUS
  Filled 2018-09-03 (×3): qty 100

## 2018-09-03 MED ORDER — SODIUM CHLORIDE 0.9% FLUSH: 10.0000 mL | INTRAVENOUS | Status: DC | PRN

## 2018-09-03 NOTE — Progress Notes (Signed)
PROGRESS NOTE  Clayton [name] QPY:[id number] DOB: [birth year] DOA: 09/02/2018 PCP: Juline Patch, MD   LOS: 1 day   Patient is from: Home  Brief Narrative / Interim history: 84 year old male with history of right LE DVT status post thrombectomy and IVC filter placement and removal, CKD 3, basal cell carcinoma, hypertension, diverticulosis and chronic back pain transferred from [facility] with GI bleed.  Patient was recently started on meloxicam for his chronic back pain. He started feeling "bad" and went to [facility] where he was found to have rectal bleeding.  Had colonoscopy that showed blood in the entire colon with ascending and sigmoid colon diverticulosis.  Had GI bleeding scan on 5/3 which was positive for active bleeding in left colonic flexure.  Patient was off blood thinners (Plavix and Eliquis) for over a week.  Hemoglobin dropped from 11-7.5.  He was transfused 1 unit with appropriate response.  He was transferred here for IR intervention.  Hemoglobin remained stable.  He did not have further bleeding.  He was evaluated by IR.  The plan is to hold off embolization and start clear liquid diet and advance diet slowly if no further bleeding.   Subjective: No major events overnight of this morning.  Has no complaints.  Denies chest pain, dyspnea, palpitation or dizziness.  Denies nausea, vomiting or abdominal pain.  He tells me he has not had a bowel movement in the last 4 days.  He says he also has not eaten anything in the last 4 days.  He is somehow not poor historian.  He says he would like to have some food if possible.  Assessment & Plan: Principal Problem:   Acute gastrointestinal hemorrhage Active Problems:   Deep vein thrombosis (DVT) of proximal vein of right lower extremity (HCC)   HTN (hypertension)   CKD (chronic kidney disease), stage III (HCC)   Diverticulosis of colon with hemorrhage   GI bleed  Symptomatic anemia due to diverticular bleed: Hemoglobin dropped  from baseline 11-7.5 -EGD and colonoscopy at [facility] on 5/2 as below -Bleeding scan at [facility] on 5/3 with active GI bleed from the splenic flexure of colon -Transfused 1 unit of blood with appropriate response. -H&H stable since transfusion. -No further bleeding.  Symptoms resolved. -IR consulted-plan to hold off embolization and slowly advance diet unless active bleeding  -Clear liquid diet. -Monitor H&H. -IV Protonix twice daily. -Continue IV fluids for now  Hisotory of right LE DVT over 4 years ago: appears unprovoked per patient's report.  No history of cancer. -status post thrombectomy and IVC filter placement and removal -Off Eliquis due to GI bleed-reportedly has not taken this in 1 week -Bilateral LE venous Doppler with probable post thrombotic changes in distal right popliteal vein but no acute DVT -SCD for VT prophylaxis  Hypertension: Normotensive.  Does not appear to be medications.  CKD 3: Renal function better than baseline.  Hypokalemia: Replenish and recheck. Scheduled Meds: . [START ON 09/06/2018] pantoprazole  40 mg Intravenous Q12H   Continuous Infusions: . sodium chloride 1,000 mL (09/03/18 0507)  . pantoprozole (PROTONIX) infusion 8 mg/hr (09/03/18 0838)   PRN Meds:.ondansetron **OR** ondansetron (ZOFRAN) IV   DVT prophylaxis: SCD due to GI bleed Code Status: Full code Family Communication: None at bedside Disposition Plan: Remains inpatient  Consultants:   IR  Procedures:   EGD on 5/2 reported as: -Tortuous esophagus -Large medial hernia -Erythematous mucosa in the gastric fundus. - Normal examined duodenum. - Non-bleeding duodenal diverticulum. - No specimens  collected. - No evidence of active or recent bleeding seen throughout the exam.   Colonoscopy on 5/2 reported as multiple small and large-mouthed diverticula were found in the sigmoid colon and ascending colon. There was evidence of recent bleeding from the diverticular opening.  Multiple diverticuli seen in the sigmoid colon with few also seen in the ascending colon. Blood was seen throughout the colon, but was more in the sigmoid colon than in any other area of the colon. The culprit diverticulum could not be identified.   Bleeding scan on 5/3 reported as active GI bleed in the left upper quadrant of the abdomen, morphologically correlating with the splenic flexure of the colon. Marked diverticulosis was seen in the left colon on remote 2008 CT abdomen study.   Microbiology: . None  Antimicrobials: Anti-infectives (From admission, onward)   None       Objective: Vitals:   09/02/18 2112 09/03/18 0510 09/03/18 1329  BP:  134/77 (!) 153/80  Pulse: 82 79 73  Resp:  18   Temp: 97.8 F (36.6 C) 97.8 F (36.6 C) (!) 97.5 F (36.4 C)  TempSrc: Oral Oral   SpO2:  99% 100%    Intake/Output Summary (Last 24 hours) at 09/03/2018 1338 Last data filed at 09/03/2018 1142 Gross per 24 hour  Intake -  Output 775 ml  Net -775 ml   There were no vitals filed for this visit.  Examination:  GENERAL: No acute distress.  Appears well.  HEENT: MMM.  Vision and hearing grossly intact.  NECK: Supple.  No JVD.  LUNGS:  No IWOB. Good air movement bilaterally. HEART:  RRR. Heart sounds normal.  ABD: Bowel sounds present. Soft. Non tender.  MSK/EXT:  Moves all extremities. No apparent deformity. No edema bilaterally.  SKIN: no apparent skin lesion or wound NEURO: Awake, alert and oriented appropriately.  No gross deficit.  PSYCH: Calm. Normal affect.   Data Reviewed: I have independently reviewed following labs and imaging studies  CBC: Recent Labs  Lab 08/30/18 1951  08/31/18 0517  09/01/18 0328 09/02/18 0357 09/02/18 1633 09/02/18 2037 09/03/18 0805  WBC 11.6*  --  7.1  --  6.5 9.8  --  10.4  --   NEUTROABS 8.5*  --   --   --   --   --   --   --   --   HGB 12.6*   < > 9.2*   < > 8.7* 7.5* 8.3* 9.7* 8.3*  HCT 39.0  --  28.5*   < > 26.7* 22.8* 24.8* 29.2*  24.7*  MCV 102.1*  --  103.3*  --  103.9* 102.2*  --  98.0  --   PLT 320  --  243  --  257 253  --  284  --    < > = values in this interval not displayed.   Basic Metabolic Panel: Recent Labs  Lab 08/30/18 1951 08/31/18 0517 09/02/18 0357 09/02/18 2037  NA 138 142 142 141  K 3.9 4.1 3.6 3.4*  CL 107 115* 113* 109  CO2 22 23 22  20*  GLUCOSE 215* 107* 95 106*  BUN 23 21 10  7*  CREATININE 1.36* 1.05 0.83 1.08  CALCIUM 8.3* 7.8* 7.8* 8.6*   GFR: Estimated Creatinine Clearance: 53.6 mL/min (by C-G formula based on SCr of 1.08 mg/dL). Liver Function Tests: Recent Labs  Lab 08/30/18 1951  AST 21  ALT 18  ALKPHOS 48  BILITOT 0.6  PROT 6.7  ALBUMIN 3.7  Recent Labs  Lab 08/30/18 1951  LIPASE 22   No results for input(s): AMMONIA in the last 168 hours. Coagulation Profile: No results for input(s): INR, PROTIME in the last 168 hours. Cardiac Enzymes: No results for input(s): CKTOTAL, CKMB, CKMBINDEX, TROPONINI in the last 168 hours. BNP (last 3 results) No results for input(s): PROBNP in the last 8760 hours. HbA1C: No results for input(s): HGBA1C in the last 72 hours. CBG: No results for input(s): GLUCAP in the last 168 hours. Lipid Profile: No results for input(s): CHOL, HDL, LDLCALC, TRIG, CHOLHDL, LDLDIRECT in the last 72 hours. Thyroid Function Tests: No results for input(s): TSH, T4TOTAL, FREET4, T3FREE, THYROIDAB in the last 72 hours. Anemia Panel: No results for input(s): VITAMINB12, FOLATE, FERRITIN, TIBC, IRON, RETICCTPCT in the last 72 hours. Urine analysis:    Component Value Date/Time   COLORURINE STRAW (A) 08/31/2018 0234   APPEARANCEUR CLEAR (A) 08/31/2018 0234   LABSPEC 1.014 08/31/2018 0234   PHURINE 5.0 08/31/2018 0234   GLUCOSEU NEGATIVE 08/31/2018 0234   HGBUR NEGATIVE 08/31/2018 0234   BILIRUBINUR NEGATIVE 08/31/2018 0234   BILIRUBINUR negative 08/17/2018 1412   KETONESUR 5 (A) 08/31/2018 0234   PROTEINUR NEGATIVE 08/31/2018 0234    UROBILINOGEN 0.2 08/17/2018 1412   NITRITE NEGATIVE 08/31/2018 0234   LEUKOCYTESUR NEGATIVE 08/31/2018 0234   Sepsis Labs: Invalid input(s): PROCALCITONIN, LACTICIDVEN  Recent Results (from the past 240 hour(s))  C difficile quick scan w PCR reflex     Status: None   Collection Time: 08/30/18  7:50 PM  Result Value Ref Range Status   C Diff antigen NEGATIVE NEGATIVE Final   C Diff toxin NEGATIVE NEGATIVE Final   C Diff interpretation No C. difficile detected.  Final    Comment: Performed at [facility], [address] [place], [place] [address]  Gastrointestinal Panel by PCR , Stool     Status: None   Collection Time: 08/30/18  7:50 PM  Result Value Ref Range Status   Campylobacter species NOT DETECTED NOT DETECTED Final   Plesimonas shigelloides NOT DETECTED NOT DETECTED Final   Salmonella species NOT DETECTED NOT DETECTED Final   Yersinia enterocolitica NOT DETECTED NOT DETECTED Final   Vibrio species NOT DETECTED NOT DETECTED Final   Vibrio cholerae NOT DETECTED NOT DETECTED Final   Enteroaggregative E coli (EAEC) NOT DETECTED NOT DETECTED Final   Enteropathogenic E coli (EPEC) NOT DETECTED NOT DETECTED Final   Enterotoxigenic E coli (ETEC) NOT DETECTED NOT DETECTED Final   Shiga like toxin producing E coli (STEC) NOT DETECTED NOT DETECTED Final   Shigella/Enteroinvasive E coli (EIEC) NOT DETECTED NOT DETECTED Final   Cryptosporidium NOT DETECTED NOT DETECTED Final   Cyclospora cayetanensis NOT DETECTED NOT DETECTED Final   Entamoeba histolytica NOT DETECTED NOT DETECTED Final   Giardia lamblia NOT DETECTED NOT DETECTED Final   Adenovirus F40/41 NOT DETECTED NOT DETECTED Final   Astrovirus NOT DETECTED NOT DETECTED Final   Norovirus GI/GII NOT DETECTED NOT DETECTED Final   Rotavirus A NOT DETECTED NOT DETECTED Final   Sapovirus (I, II, IV, and V) NOT DETECTED NOT DETECTED Final    Comment: Performed at [facility], [address], [place], [place]  [address]      Radiology Studies: No results found.  35  minutes with more than 50% spent in reviewing records, counseling patient and coordinating care.  [facility] Pager [phone]  If 7PM-7AM, please contact night-coverage [email or web address] Password [email or web address] 09/03/2018, 1:38 PM

## 2018-09-03 NOTE — Progress Notes (Signed)
Patient ID: Clayton [name], male   DOB: 07/03/1934, 84 y.o.   MRN: [id number]   Dr Annamaria Boots has spoken to Dr Tressia Miners this am regarding this pt.  Pt is hemodynamically stable Hg stable  No evidence of bleeding at this point per MD  Rec clear liquids x 24 hrs No needs for arteriogram at this time IR will follow chart  If bleeding recurs Please call for possible arteriogram/embolization

## 2018-09-03 NOTE — Anesthesia Postprocedure Evaluation (Signed)
Anesthesia Post Note  Patient: Clayton [name]  Procedure(s) Performed: ESOPHAGOGASTRODUODENOSCOPY (EGD) (N/A ) COLONOSCOPY (N/A )  Patient location during evaluation: PACU Anesthesia Type: General Level of consciousness: awake and alert Pain management: pain level controlled Vital Signs Assessment: post-procedure vital signs reviewed and stable Respiratory status: spontaneous breathing, nonlabored ventilation and respiratory function stable Cardiovascular status: blood pressure returned to baseline and stable Postop Assessment: no apparent nausea or vomiting Anesthetic complications: no     Last Vitals:  Vitals:   09/02/18 1832 09/02/18 2112  BP: (!) 164/81 (!) 148/80  Pulse: 85 82  Resp:  18  Temp: 36.9 C 36.6 C  SpO2: 100% 100%    Last Pain:  Vitals:   09/02/18 2112  TempSrc: Oral  PainSc:                  Durenda Hurt

## 2018-09-04 ENCOUNTER — Encounter: Payer: Self-pay | Admitting: Gastroenterology

## 2018-09-04 DIAGNOSIS — K5731 Diverticulosis of large intestine without perforation or abscess with bleeding: Principal | ICD-10-CM

## 2018-09-04 LAB — BASIC METABOLIC PANEL
Anion gap: 9 (ref 5–15)
BUN: 11 mg/dL (ref 8–23)
CO2: 22 mmol/L (ref 22–32)
Calcium: 8.1 mg/dL — ABNORMAL LOW (ref 8.9–10.3)
Chloride: 110 mmol/L (ref 98–111)
Creatinine, Ser: 1.11 mg/dL (ref 0.61–1.24)
GFR calc Af Amer: >60 mL/min (ref >60)
GFR calc non Af Amer: >60 mL/min (ref >60)
Glucose, Bld: 84 mg/dL (ref 70–99)
Potassium: 3.5 mmol/L (ref 3.5–5.1)
Sodium: 141 mmol/L (ref 135–145)

## 2018-09-04 LAB — HEMOGLOBIN AND HEMATOCRIT, BLOOD
HCT: 23.6 % — ABNORMAL LOW (ref 39.0–52.0)
HCT: 27.1 % — ABNORMAL LOW (ref 39.0–52.0)
Hemoglobin: 7.9 g/dL — ABNORMAL LOW (ref 13.0–17.0)
Hemoglobin: 9.1 g/dL — ABNORMAL LOW (ref 13.0–17.0)

## 2018-09-04 NOTE — Progress Notes (Signed)
PROGRESS NOTE  DEMAURI ADVINCULA JOA:[id number] DOB: [birth year] DOA: 09/02/2018 PCP: Juline Patch, MD   LOS: 2 days   Patient is from: Home  Brief Narrative / Interim history: 84 year old male with history of right LE DVT status post thrombectomy and IVC filter placement and removal, CKD 3, basal cell carcinoma, hypertension, diverticulosis and chronic back pain transferred from [facility] with GI bleed.  Patient was recently started on meloxicam for his chronic back pain. He started feeling "bad" and went to [facility] where he was found to have rectal bleeding.  Had colonoscopy that showed blood in the entire colon with ascending and sigmoid colon diverticulosis.  Had GI bleeding scan on 5/3 which was positive for active bleeding in left colonic flexure.  Patient was off blood thinners (Plavix and Eliquis) for over a week.  Hemoglobin dropped from 11-7.5.  He was transfused 1 unit with appropriate response.  He was transferred here for IR intervention.  Hemoglobin remained stable.  He did not have further bleeding.  He was evaluated by IR. The plan is to hold off embolization and start clear liquid diet and advance diet slowly if no further bleeding.   Subjective: No major events overnight of this morning.  Patient does anxious yesterday.  He threatened to leave AMA late in the afternoon until we talk to him.  He looks calmer and apologetic today.  Has no complaints.  Has not had bowel movement yet.  Denies abdominal pain, nausea or vomiting.   Assessment & Plan: Principal Problem:   Acute gastrointestinal hemorrhage Active Problems:   Deep vein thrombosis (DVT) of proximal vein of right lower extremity (HCC)   HTN (hypertension)   CKD (chronic kidney disease), stage III (HCC)   Diverticulosis of colon with hemorrhage   GI bleed   Symptomatic anemia  Symptomatic anemia due to diverticular bleed: Hemoglobin dropped from baseline 11-7.5 -EGD and colonoscopy at [facility] on 5/2 as below  -Bleeding scan at [facility] on 5/3 with active GI bleed from the splenic flexure of colon -Transfused 1 unit of blood with appropriate response-hemoglobin 7.9 today likely reaching equilibrium -IR consulted-plan to hold off embolization and slowly advance diet unless active bleeding  -No further bleeding but has not had bowel movement yet. -We will advance to full liquid diet. -Monitor H&H every 12 hours -IV Protonix twice daily. -Continue IV fluids for now  Hisotory of right LE DVT over 4 years ago: appears unprovoked per patient's report.  No history of cancer. -status post thrombectomy and IVC filter placement and removal -Off Eliquis due to GI bleed-reportedly has not taken this in 1 week -Bilateral LE venous Doppler with probable post thrombotic changes in distal right popliteal vein but no acute DVT -SCD for VT prophylaxis  Hypertension: Normotensive.  does not appear to be medications.  CKD 3: Renal function better than baseline.  Hypokalemia: Replenish and recheck.  Scheduled Meds: . [START ON 09/06/2018] pantoprazole  40 mg Intravenous Q12H   Continuous Infusions:  PRN Meds:.ondansetron **OR** ondansetron (ZOFRAN) IV, sodium chloride flush   DVT prophylaxis: SCD due to GI bleed Code Status: Full code Family Communication: Discussed with patient's wife over the phone.  Disposition Plan: Remains inpatient for GI bleed until he tolerates soft diet.  Very high risk for rebleeding and deconditioning.  Consultants:   IR  Procedures:   EGD on 5/2 reported as: -Tortuous esophagus -Large medial hernia -Erythematous mucosa in the gastric fundus. - Normal examined duodenum. - Non-bleeding duodenal diverticulum. - No  specimens collected. - No evidence of active or recent bleeding seen throughout the exam.   Colonoscopy on 5/2 reported as multiple small and large-mouthed diverticula were found in the sigmoid colon and ascending colon. There was evidence of recent bleeding  from the diverticular opening. Multiple diverticuli seen in the sigmoid colon with few also seen in the ascending colon. Blood was seen throughout the colon, but was more in the sigmoid colon than in any other area of the colon. The culprit diverticulum could not be identified.   Bleeding scan on 5/3 reported as active GI bleed in the left upper quadrant of the abdomen, morphologically correlating with the splenic flexure of the colon. Marked diverticulosis was seen in the left colon on remote 2008 CT abdomen study.   Microbiology: . None  Antimicrobials: Anti-infectives (From admission, onward)   None      Objective: Vitals:   09/03/18 0510 09/03/18 1329 09/03/18 2106 09/04/18 0529  BP: 134/77 (!) 153/80 (!) 143/72 119/73  Pulse: 79 73 82 78  Resp: 18     Temp: 97.8 F (36.6 C) (!) 97.5 F (36.4 C) 97.8 F (36.6 C) 98.9 F (37.2 C)  TempSrc: Oral  Oral Oral  SpO2: 99% 100% 100% 100%    Intake/Output Summary (Last 24 hours) at 09/04/2018 1158 Last data filed at 09/04/2018 0551 Gross per 24 hour  Intake 429.17 ml  Output 1250 ml  Net -820.83 ml   There were no vitals filed for this visit.  Examination:  GENERAL: No acute distress.  Appears well.  Reading on his tablet. HEENT: MMM.  Vision and hearing grossly intact.  NECK: Supple.  No JVD.  LUNGS:  No IWOB. Good air movement bilaterally. HEART:  RRR. Heart sounds normal.  ABD: Bowel sounds present. Soft. Non tender.  MSK/EXT:  Moves all extremities. No apparent deformity. No edema bilaterally.  SKIN: no apparent skin lesion or wound NEURO: Awake, alert and oriented appropriately.  No gross deficit.  PSYCH: Calm. Normal affect.  Data Reviewed: I have independently reviewed following labs and imaging studies  CBC: Recent Labs  Lab 08/30/18 1951  08/31/18 0517  09/01/18 0328 09/02/18 0357  09/02/18 2037 09/03/18 0805 09/03/18 1327 09/03/18 2020 09/04/18 0452  WBC 11.6*  --  7.1  --  6.5 9.8  --  10.4  --   --    --   --   NEUTROABS 8.5*  --   --   --   --   --   --   --   --   --   --   --   HGB 12.6*   < > 9.2*   < > 8.7* 7.5*   < > 9.7* 8.3* 8.5* 8.3* 7.9*  HCT 39.0  --  28.5*   < > 26.7* 22.8*   < > 29.2* 24.7* 25.0* 24.9* 23.6*  MCV 102.1*  --  103.3*  --  103.9* 102.2*  --  98.0  --   --   --   --   PLT 320  --  243  --  257 253  --  284  --   --   --   --    < > = values in this interval not displayed.   Basic Metabolic Panel: Recent Labs  Lab 08/30/18 1951 08/31/18 0517 09/02/18 0357 09/02/18 2037 09/04/18 0452  NA 138 142 142 141 141  K 3.9 4.1 3.6 3.4* 3.5  CL 107 115* 113* 109 110  CO2 22 23 22  20* 22  GLUCOSE 215* 107* 95 106* 84  BUN 23 21 10  7* 11  CREATININE 1.36* 1.05 0.83 1.08 1.11  CALCIUM 8.3* 7.8* 7.8* 8.6* 8.1*   GFR: Estimated Creatinine Clearance: 52.2 mL/min (by C-G formula based on SCr of 1.11 mg/dL). Liver Function Tests: Recent Labs  Lab 08/30/18 1951  AST 21  ALT 18  ALKPHOS 48  BILITOT 0.6  PROT 6.7  ALBUMIN 3.7   Recent Labs  Lab 08/30/18 1951  LIPASE 22   No results for input(s): AMMONIA in the last 168 hours. Coagulation Profile: No results for input(s): INR, PROTIME in the last 168 hours. Cardiac Enzymes: No results for input(s): CKTOTAL, CKMB, CKMBINDEX, TROPONINI in the last 168 hours. BNP (last 3 results) No results for input(s): PROBNP in the last 8760 hours. HbA1C: No results for input(s): HGBA1C in the last 72 hours. CBG: No results for input(s): GLUCAP in the last 168 hours. Lipid Profile: No results for input(s): CHOL, HDL, LDLCALC, TRIG, CHOLHDL, LDLDIRECT in the last 72 hours. Thyroid Function Tests: No results for input(s): TSH, T4TOTAL, FREET4, T3FREE, THYROIDAB in the last 72 hours. Anemia Panel: No results for input(s): VITAMINB12, FOLATE, FERRITIN, TIBC, IRON, RETICCTPCT in the last 72 hours. Urine analysis:    Component Value Date/Time   COLORURINE STRAW (A) 08/31/2018 0234   APPEARANCEUR CLEAR (A) 08/31/2018  0234   LABSPEC 1.014 08/31/2018 0234   PHURINE 5.0 08/31/2018 0234   GLUCOSEU NEGATIVE 08/31/2018 0234   HGBUR NEGATIVE 08/31/2018 0234   BILIRUBINUR NEGATIVE 08/31/2018 0234   BILIRUBINUR negative 08/17/2018 1412   KETONESUR 5 (A) 08/31/2018 0234   PROTEINUR NEGATIVE 08/31/2018 0234   UROBILINOGEN 0.2 08/17/2018 1412   NITRITE NEGATIVE 08/31/2018 0234   LEUKOCYTESUR NEGATIVE 08/31/2018 0234   Sepsis Labs: Invalid input(s): PROCALCITONIN, LACTICIDVEN  Recent Results (from the past 240 hour(s))  C difficile quick scan w PCR reflex     Status: None   Collection Time: 08/30/18  7:50 PM  Result Value Ref Range Status   C Diff antigen NEGATIVE NEGATIVE Final   C Diff toxin NEGATIVE NEGATIVE Final   C Diff interpretation No C. difficile detected.  Final    Comment: Performed at [facility], [address] [place], [place] [address]  Gastrointestinal Panel by PCR , Stool     Status: None   Collection Time: 08/30/18  7:50 PM  Result Value Ref Range Status   Campylobacter species NOT DETECTED NOT DETECTED Final   Plesimonas shigelloides NOT DETECTED NOT DETECTED Final   Salmonella species NOT DETECTED NOT DETECTED Final   Yersinia enterocolitica NOT DETECTED NOT DETECTED Final   Vibrio species NOT DETECTED NOT DETECTED Final   Vibrio cholerae NOT DETECTED NOT DETECTED Final   Enteroaggregative E coli (EAEC) NOT DETECTED NOT DETECTED Final   Enteropathogenic E coli (EPEC) NOT DETECTED NOT DETECTED Final   Enterotoxigenic E coli (ETEC) NOT DETECTED NOT DETECTED Final   Shiga like toxin producing E coli (STEC) NOT DETECTED NOT DETECTED Final   Shigella/Enteroinvasive E coli (EIEC) NOT DETECTED NOT DETECTED Final   Cryptosporidium NOT DETECTED NOT DETECTED Final   Cyclospora cayetanensis NOT DETECTED NOT DETECTED Final   Entamoeba histolytica NOT DETECTED NOT DETECTED Final   Giardia lamblia NOT DETECTED NOT DETECTED Final   Adenovirus F40/41 NOT DETECTED NOT DETECTED Final    Astrovirus NOT DETECTED NOT DETECTED Final   Norovirus GI/GII NOT DETECTED NOT DETECTED Final   Rotavirus A NOT DETECTED  NOT DETECTED Final   Sapovirus (I, II, IV, and V) NOT DETECTED NOT DETECTED Final    Comment: Performed at [facility], [address], [place],  [address]      Radiology Studies: No results found.   Taye T. [facility] Pager [phone]  If 7PM-7AM, please contact night-coverage [email or web address] Password [email or web address] 09/04/2018, 11:58 AM

## 2018-09-05 DIAGNOSIS — D62 Acute posthemorrhagic anemia: Secondary | ICD-10-CM

## 2018-09-05 LAB — BASIC METABOLIC PANEL
Anion gap: 8 (ref 5–15)
BUN: 12 mg/dL (ref 8–23)
CO2: 22 mmol/L (ref 22–32)
Calcium: 8.5 mg/dL — ABNORMAL LOW (ref 8.9–10.3)
Chloride: 111 mmol/L (ref 98–111)
Creatinine, Ser: 1.05 mg/dL (ref 0.61–1.24)
GFR calc Af Amer: >60 mL/min (ref >60)
GFR calc non Af Amer: >60 mL/min (ref >60)
Glucose, Bld: 93 mg/dL (ref 70–99)
Potassium: 3.5 mmol/L (ref 3.5–5.1)
Sodium: 141 mmol/L (ref 135–145)

## 2018-09-05 LAB — HEMOGLOBIN AND HEMATOCRIT, BLOOD
HCT: 25.1 % — ABNORMAL LOW (ref 39.0–52.0)
Hemoglobin: 8.4 g/dL — ABNORMAL LOW (ref 13.0–17.0)

## 2018-09-05 LAB — MAGNESIUM: Magnesium: 2.1 mg/dL (ref 1.7–2.4)

## 2018-09-05 NOTE — Discharge Summary (Signed)
Physician Discharge Summary  Clayton [name] NAT:[id number] DOB: 09-Mar-1935 DOA: 09/02/2018  PCP: Clayton Patch, MD  Admit date: 09/02/2018 Discharge date: 09/05/2018  Admitted From: Home --> [facility] Disposition:  Home   Recommendations for Outpatient Follow-up:  1. Follow up with PCP in 1 week 2. Follow up with GI in 2-3 weeks 3. Please obtain BMP/CBC in one week    Home Health: None  Equipment/Devices: None  Discharge Condition: Fair  CODE STATUS: FULL Diet recommendation: Soft  Brief/Interim Summary: Clayton [name] is an 84 y.o. M with hx DVT remote no longer on [facility], as well as early dementia who presented with rectal bleeding.    Initially admitted at [facility], underwent endoscopy there, suspected to have diverticular bleeding.  Underwent tagged red cell scan that showed bleeding source in hepatic flexure, transferred to [facility] for IR consultation.        PRINCIPAL HOSPITAL DIAGNOSIS: Acute lower GI bleed    Discharge Diagnoses:   Acute lower GI bleed Suspected diverticular bleeding Presented to [facility], underwent upper and lower endoscopy with Clayton [name]. EGD normal but colonoscopy showed evidence of recent bleeding and blood throughout colon. Tagged red cell scan obtained and showed source of bleeding.   Patient transferred to [facility] for IR evaluation, but was hemodynamically stable, and without further bleeding.  He was monitored for 24 hours and had no bleeding or hemodynamic instability.  Counseled to follow up with PCP and Clayton [name].    Acute blood loss anemia Hgb baseline 12 g/dL, dropped to 7.5 g/dL in hospital.  Transfused 1 unit at OSH.  Hgb stable since.  History of DVT, unprovoked, single, finished >6 months anticoagulation First unprovoked DVT in early 2019.  On Eliquis >6 months, since stopped.  Had IVC, since removed.  After stopping Eliquis, was started on Plavix by Clayton [name], who treated him for DVT, which he takes sporadically. -Stop  Plavix, no plan for restarting       Discharge Instructions  Discharge Instructions    Ambulatory referral to Gastroenterology   Complete by:  As directed    Or [facility]   Diet general   Complete by:  As directed    Discharge instructions   Complete by:  As directed    From Clayton [name]: You were admitted with bleeding from your bowel movements.  Based on Clayton [name] colonoscopy, and also the tagged red blood cell scan, it appears your bleeding was from a bleeding diverticulum.  Diverticuli are common findings in the colon.  They are out pouches, where the wall of the colon has weakened a little.  Sometimes they erode and bleed, and when they do, the bleeding can be intense at times, or at other times weak and stuttering.  For now, it appars to have stopped.  For now, I think it is probably riskier to restart aspirin or Plavix than to take it (you were taking it for your old blood clot in the leg, and that was so long ago, there is probably only a small benefit from taking Plavix)  I recommend you follow up at least once with the GI doctor, so I sent a referral, they should call you from Clayton [name] office at [facility]. They have a [place] and a [place] office. If you haven't heard from them in 1 week, call them at the number listed below in the To Do section.  For the next weak, eat a low fiber diet (bananas, rice, toast), and  avoid foods that have nuts or seeds (including things like strawberries, popcorn, etc)   Increase activity slowly   Complete by:  As directed      Allergies as of 09/05/2018      Reactions   Aspirin Rash   Sulfa Antibiotics Rash      Medication List    STOP taking these medications   pantoprazole 40 MG tablet Commonly known as:  PROTONIX   vitamin B-12 500 MCG tablet Commonly known as:  CYANOCOBALAMIN      Follow-up Information    Clayton Manifold, MD Follow up.   Specialty:   Gastroenterology Contact information: [place] [address] [phone]        Clayton Patch, MD. Schedule an appointment as soon as possible for a visit in 1 week(s).   Specialty:  Family Medicine Contact information: [address] [place] [address] [phone]          Allergies  Allergen Reactions  . Aspirin Rash  . Sulfa Antibiotics Rash    Consultations:  GI  IR   Procedures/Studies: Dg Lumbar Spine Complete  Result Date: 08/18/2018 CLINICAL DATA:  Low back [name].  No trauma. EXAM: LUMBAR SPINE - COMPLETE 4+ VIEW COMPARISON:  None. FINDINGS: Normal alignment of lumbar vertebral bodies. No loss of vertebral body height or disc height. No pars fracture. No subluxation. Degenerate spurring from L1-L3. Endplate sclerosis superiorly at L3. No subluxation. Atherosclerotic calcification of the aorta. IMPRESSION: 1. No acute findings of the lumbar spine. 2. Mild to moderate disc osteophytic disease. 3.  Aortic Atherosclerosis (ICD10-I70.0). Electronically Signed   By: Clayton [name] M.D.   On: 08/18/2018 11:03   Nm Gi Blood Loss  Addendum Date: 09/02/2018   ADDENDUM REPORT: 09/02/2018 13:58 ADDENDUM: Critical Value/emergent results were called by telephone at the time of interpretation on 09/02/2018 at 1:45 pm to Dr. Tressia [name], who verbally acknowledged these results. Electronically Signed   By: Clayton [name] M.D.   On: 09/02/2018 13:58   Result Date: 09/02/2018 CLINICAL DATA:  Inpatient. GI bleed with bright red and dark blood per rectum. EXAM: NUCLEAR MEDICINE GASTROINTESTINAL BLEEDING SCAN TECHNIQUE: Sequential abdominal images were obtained following intravenous administration of Tc-69m labeled red blood cells. RADIOPHARMACEUTICALS:  21.0 mCi Tc-75m pertechnetate in-vitro labeled red cells. COMPARISON:  04/30/2007 CT abdomen/pelvis. FINDINGS: During the second hour of imaging, there is curvilinear radiotracer activity in the left upper  quadrant of the abdomen, which appears to demonstrate transit in the expected configuration of splenic flexure of the colon. No additional sites of active GI bleed. IMPRESSION: Findings are compatible with active GI bleed in the left upper quadrant of the abdomen, morphologically correlating with the splenic flexure of the colon. Marked diverticulosis was seen in the left colon on remote 2008 CT abdomen study. Critical Value/emergent results were called by telephone at the time of interpretation on 09/02/2018 at 12:51 pm to RN [facility], who verbally acknowledged these results. We are also trying to contact the ordering provider. Electronically Signed: By: Clayton [name] M.D. On: 09/02/2018 13:19   US Venous Img Lower Bilateral  Result Date: 09/01/2018 CLINICAL DATA:  Swelling, [name], GI bleed, history of DVT EXAM: BILATERAL LOWER EXTREMITY VENOUS DOPPLER ULTRASOUND TECHNIQUE: Gray-scale sonography with compression, as well as color and duplex ultrasound, were performed to evaluate the deep venous system from the level of the common femoral vein through the popliteal and proximal calf veins. COMPARISON:  05/16/2017 FINDINGS: On the right, normal  compressibility of the common femoral, superficial femoral veins. There is mural hypoechoic material in the distal popliteal vein, with continued flow signal through the lumen on color Doppler. Unremarkable visualized proximal calf veins. On the left, normal compressibility of common femoral, superficial femoral, and popliteal veins. Visualized calf veins unremarkable. No filling defects to suggest DVT on grayscale or color Doppler imaging. Doppler waveforms show normal direction of venous flow, normal respiratory phasicity and response to augmentation. IMPRESSION: 1. Probable post thrombotic change in the distal right popliteal vein. No evidence of acute DVT. 2. On the left, no femoropopliteal and no calf DVT in the visualized calf veins. If clinical symptoms are  inconsistent or if there are persistent or worsening symptoms, further imaging (possibly involving the iliac veins) may be warranted. Electronically Signed   By: Lucrezia Europe M.D.   On: 09/01/2018 17:36       Subjective: Feeling well.  No abdominal [name], no hematochezia, no melena, no vomiting, no hematemesis.  No dizziness with standing, no syncope, no weakness.  Discharge Exam: Vitals:   09/04/18 2132 09/05/18 0523  BP: (!) 143/70 125/72  Pulse: 76 70  Resp:    Temp: 98.6 F (37 C) 98.3 F (36.8 C)  SpO2: 100% 100%   Vitals:   09/04/18 0529 09/04/18 1427 09/04/18 2132 09/05/18 0523  BP: 119/73 123/79 (!) 143/70 125/72  Pulse: 78 85 76 70  Resp:  18    Temp: 98.9 F (37.2 C) 98 F (36.7 C) 98.6 F (37 C) 98.3 F (36.8 C)  TempSrc: Oral Oral Oral Oral  SpO2: 100% 99% 100% 100%    General: Pt is alert, awake, not in acute distress Cardiovascular: RRR, nl S1-S2, no murmurs appreciated.   No LE edema.   Respiratory: Normal respiratory rate and rhythm.  CTAB without rales or wheezes. Abdominal: Abdomen soft and non-tender.  No distension or HSM.   Neuro/Psych: Strength symmetric in upper and lower extremities.  Judgment and insight appear normal.   The results of significant diagnostics from this hospitalization (including imaging, microbiology, ancillary and laboratory) are listed below for reference.     Microbiology: Recent Results (from the past 240 hour(s))  C difficile quick scan w PCR reflex     Status: None   Collection Time: 08/30/18  7:50 PM  Result Value Ref Range Status   C Diff antigen NEGATIVE NEGATIVE Final   C Diff toxin NEGATIVE NEGATIVE Final   C Diff interpretation No C. difficile detected.  Final    Comment: Performed at [facility], [address] [place], [place] [address]  Gastrointestinal Panel by PCR , Stool     Status: None   Collection Time: 08/30/18  7:50 PM  Result Value Ref Range Status   Campylobacter species NOT DETECTED  NOT DETECTED Final   Plesimonas shigelloides NOT DETECTED NOT DETECTED Final   Salmonella species NOT DETECTED NOT DETECTED Final   Yersinia enterocolitica NOT DETECTED NOT DETECTED Final   Vibrio species NOT DETECTED NOT DETECTED Final   Vibrio cholerae NOT DETECTED NOT DETECTED Final   Enteroaggregative E coli (EAEC) NOT DETECTED NOT DETECTED Final   Enteropathogenic E coli (EPEC) NOT DETECTED NOT DETECTED Final   Enterotoxigenic E coli (ETEC) NOT DETECTED NOT DETECTED Final   Shiga like toxin producing E coli (STEC) NOT DETECTED NOT DETECTED Final   Shigella/Enteroinvasive E coli (EIEC) NOT DETECTED NOT DETECTED Final   Cryptosporidium NOT DETECTED NOT DETECTED Final   Cyclospora cayetanensis NOT DETECTED NOT DETECTED  Final   Entamoeba histolytica NOT DETECTED NOT DETECTED Final   Giardia lamblia NOT DETECTED NOT DETECTED Final   Adenovirus F40/41 NOT DETECTED NOT DETECTED Final   Astrovirus NOT DETECTED NOT DETECTED Final   Norovirus GI/GII NOT DETECTED NOT DETECTED Final   Rotavirus A NOT DETECTED NOT DETECTED Final   Sapovirus (I, II, IV, and V) NOT DETECTED NOT DETECTED Final    Comment: Performed at [facility], [address] [place], [place] [address]     Labs: BNP (last 3 results) No results for input(s): BNP in the last 8760 hours. Basic Metabolic Panel: Recent Labs  Lab 08/31/18 0517 09/02/18 0357 09/02/18 2037 09/04/18 0452 09/05/18 0605  NA 142 142 141 141 141  K 4.1 3.6 3.4* 3.5 3.5  CL 115* 113* 109 110 111  CO2 23 22 20* 22 22  GLUCOSE 107* 95 106* 84 93  BUN 21 10 7* 11 12  CREATININE 1.05 0.83 1.08 1.11 1.05  CALCIUM 7.8* 7.8* 8.6* 8.1* 8.5*  MG  --   --   --   --  2.1   Liver Function Tests: Recent Labs  Lab 08/30/18 1951  AST 21  ALT 18  ALKPHOS 48  BILITOT 0.6  PROT 6.7  ALBUMIN 3.7   Recent Labs  Lab 08/30/18 1951  LIPASE 22   No results for input(s): AMMONIA in the last 168 hours. CBC: Recent Labs  Lab 08/30/18 1951   08/31/18 0517  09/01/18 0328 09/02/18 0357  09/02/18 2037  09/03/18 1327 09/03/18 2020 09/04/18 0452 09/04/18 1809 09/05/18 0605  WBC 11.6*  --  7.1  --  6.5 9.8  --  10.4  --   --   --   --   --   --   NEUTROABS 8.5*  --   --   --   --   --   --   --   --   --   --   --   --   --   HGB 12.6*   < > 9.2*   < > 8.7* 7.5*   < > 9.7*   < > 8.5* 8.3* 7.9* 9.1* 8.4*  HCT 39.0  --  28.5*   < > 26.7* 22.8*   < > 29.2*   < > 25.0* 24.9* 23.6* 27.1* 25.1*  MCV 102.1*  --  103.3*  --  103.9* 102.2*  --  98.0  --   --   --   --   --   --   PLT 320  --  243  --  257 253  --  284  --   --   --   --   --   --    < > = values in this interval not displayed.   Cardiac Enzymes: No results for input(s): CKTOTAL, CKMB, CKMBINDEX, TROPONINI in the last 168 hours. BNP: Invalid input(s): POCBNP CBG: No results for input(s): GLUCAP in the last 168 hours. D-Dimer No results for input(s): DDIMER in the last 72 hours. Hgb A1c No results for input(s): HGBA1C in the last 72 hours. Lipid Profile No results for input(s): CHOL, HDL, LDLCALC, TRIG, CHOLHDL, LDLDIRECT in the last 72 hours. Thyroid function studies No results for input(s): TSH, T4TOTAL, T3FREE, THYROIDAB in the last 72 hours.  Invalid input(s): FREET3 Anemia work up No results for input(s): VITAMINB12, FOLATE, FERRITIN, TIBC, IRON, RETICCTPCT in the last 72 hours. Urinalysis    Component Value Date/Time  COLORURINE STRAW (A) 08/31/2018 0234   APPEARANCEUR CLEAR (A) 08/31/2018 0234   LABSPEC 1.014 08/31/2018 0234   PHURINE 5.0 08/31/2018 0234   GLUCOSEU NEGATIVE 08/31/2018 0234   HGBUR NEGATIVE 08/31/2018 0234   BILIRUBINUR NEGATIVE 08/31/2018 0234   BILIRUBINUR negative 08/17/2018 1412   KETONESUR 5 (A) 08/31/2018 0234   PROTEINUR NEGATIVE 08/31/2018 0234   UROBILINOGEN 0.2 08/17/2018 1412   NITRITE NEGATIVE 08/31/2018 0234   LEUKOCYTESUR NEGATIVE 08/31/2018 0234   Sepsis Labs Invalid input(s): PROCALCITONIN,  WBC,   LACTICIDVEN Microbiology Recent Results (from the past 240 hour(s))  C difficile quick scan w PCR reflex     Status: None   Collection Time: 08/30/18  7:50 PM  Result Value Ref Range Status   C Diff antigen NEGATIVE NEGATIVE Final   C Diff toxin NEGATIVE NEGATIVE Final   C Diff interpretation No C. difficile detected.  Final    Comment: Performed at [facility], [address] [place], [place] [address]  Gastrointestinal Panel by PCR , Stool     Status: None   Collection Time: 08/30/18  7:50 PM  Result Value Ref Range Status   Campylobacter species NOT DETECTED NOT DETECTED Final   Plesimonas shigelloides NOT DETECTED NOT DETECTED Final   Salmonella species NOT DETECTED NOT DETECTED Final   Yersinia enterocolitica NOT DETECTED NOT DETECTED Final   Vibrio species NOT DETECTED NOT DETECTED Final   Vibrio cholerae NOT DETECTED NOT DETECTED Final   Enteroaggregative E coli (EAEC) NOT DETECTED NOT DETECTED Final   Enteropathogenic E coli (EPEC) NOT DETECTED NOT DETECTED Final   Enterotoxigenic E coli (ETEC) NOT DETECTED NOT DETECTED Final   Shiga like toxin producing E coli (STEC) NOT DETECTED NOT DETECTED Final   Shigella/Enteroinvasive E coli (EIEC) NOT DETECTED NOT DETECTED Final   Cryptosporidium NOT DETECTED NOT DETECTED Final   Cyclospora cayetanensis NOT DETECTED NOT DETECTED Final   Entamoeba histolytica NOT DETECTED NOT DETECTED Final   Giardia lamblia NOT DETECTED NOT DETECTED Final   Adenovirus F40/41 NOT DETECTED NOT DETECTED Final   Astrovirus NOT DETECTED NOT DETECTED Final   Norovirus GI/GII NOT DETECTED NOT DETECTED Final   Rotavirus A NOT DETECTED NOT DETECTED Final   Sapovirus (I, II, IV, and V) NOT DETECTED NOT DETECTED Final    Comment: Performed at [facility], [address], [place], [place] [address]     Time coordinating discharge: 25 minutes       SIGNED:   Edwin Dada, MD  [facility] 09/05/2018, 4:43  PM

## 2018-09-05 NOTE — Progress Notes (Signed)
Pt given discharge instructions and care notes. Pt verbalized understanding AEB no further questions or concerns at this time. IV was discontinued, no redness, pain, or swelling noted at this time. Telemetry discontinued and Centralized Telemetry was notified. Pt left the floor via wheelchair with staff in stable condition. 

## 2018-09-06 ENCOUNTER — Telehealth: Payer: Self-pay

## 2018-09-06 NOTE — Telephone Encounter (Signed)
Transition Care Management Follow-up Telephone Call  Date of discharge and from where: 09/05/18 [facility]  How have you been since you were released from the hospital? Pt denies pain, feeling very fatigued and tires easily.  Any questions or concerns? No   Items Reviewed:  Did the pt receive and understand the discharge instructions provided? Yes   Medications obtained and verified? Yes   Any new allergies since your discharge? No   Dietary orders reviewed? Yes  Do you have support at home? Yes   Functional Questionnaire: (I = Independent and D = Dependent) ADLs: I  Bathing/Dressing- I  Meal Prep- I  Eating- I  Maintaining continence- I  Transferring/Ambulation- I  Managing Meds- I  Follow up appointments reviewed:   PCP Hospital f/u appt confirmed? No  Pt declined offer to schedule hospital follow up appt. States he is going to wait a few days to scheduled.   [facility] f/u appt confirmed? No  Patient has not scheduled GI follow up at this time.  Are transportation arrangements needed? No   If their condition worsens, is the pt aware to call PCP or go to the Emergency Dept.? Yes  Was the patient provided with contact information for the PCP's office or ED? Yes  Was to pt encouraged to call back with questions or concerns? Yes

## 2018-09-07 ENCOUNTER — Encounter: Payer: Self-pay | Admitting: Family Medicine

## 2018-09-07 ENCOUNTER — Other Ambulatory Visit: Payer: Self-pay

## 2018-09-07 ENCOUNTER — Other Ambulatory Visit
Admission: RE | Admit: 2018-09-07 | Discharge: 2018-09-07 | Disposition: A | Discharge status: Home / Self Care | Payer: Medicare Other | Attending: Family Medicine | Admitting: Family Medicine

## 2018-09-07 ENCOUNTER — Ambulatory Visit (INDEPENDENT_AMBULATORY_CARE_PROVIDER_SITE_OTHER): Payer: Medicare Other | Admitting: Family Medicine

## 2018-09-07 VITALS — Ht 70.0 in | Wt 164.0 lb

## 2018-09-07 DIAGNOSIS — Z8719 Personal history of other diseases of the digestive system: Secondary | ICD-10-CM | POA: Diagnosis not present

## 2018-09-07 DIAGNOSIS — K5730 Diverticulosis of large intestine without perforation or abscess without bleeding: Secondary | ICD-10-CM | POA: Diagnosis not present

## 2018-09-07 DIAGNOSIS — K922 Gastrointestinal hemorrhage, unspecified: Secondary | ICD-10-CM | POA: Insufficient documentation

## 2018-09-07 LAB — CBC WITH DIFFERENTIAL/PLATELET
Abs Immature Granulocytes: 0.05 10*3/uL (ref 0.00–0.07)
Basophils Absolute: 0.1 10*3/uL (ref 0.0–0.1)
Basophils Relative: 1 %
Eosinophils Absolute: 0.1 10*3/uL (ref 0.0–0.5)
Eosinophils Relative: 1 %
HCT: 31.9 % — ABNORMAL LOW (ref 39.0–52.0)
Hemoglobin: 10.8 g/dL — ABNORMAL LOW (ref 13.0–17.0)
Immature Granulocytes: 1 %
Lymphocytes Relative: 15 %
Lymphs Abs: 1.5 10*3/uL (ref 0.7–4.0)
MCH: 34.4 pg — ABNORMAL HIGH (ref 26.0–34.0)
MCHC: 33.9 g/dL (ref 30.0–36.0)
MCV: 101.6 fL — ABNORMAL HIGH (ref 80.0–100.0)
Monocytes Absolute: 0.9 10*3/uL (ref 0.1–1.0)
Monocytes Relative: 10 %
Neutro Abs: 7 10*3/uL (ref 1.7–7.7)
Neutrophils Relative %: 72 %
Platelets: 418 10*3/uL — ABNORMAL HIGH (ref 150–400)
RBC: 3.14 MIL/uL — ABNORMAL LOW (ref 4.22–5.81)
RDW: 15.2 % (ref 11.5–15.5)
WBC: 9.6 10*3/uL (ref 4.0–10.5)
nRBC: 0 % (ref 0.0–0.2)

## 2018-09-07 NOTE — Progress Notes (Signed)
Date:  09/07/2018   Name:  Clayton [name]   DOB:  [birth year]   MRN:  [id number]   Chief Complaint: blood passed (dark blood- checking for orthostatic and STAT cbc labs)  GI Problem  The primary symptoms include hematochezia. Primary symptoms do not include fever, weight loss, fatigue, abdominal pain, nausea, vomiting, diarrhea, melena, hematemesis, jaundice, dysuria, myalgias, arthralgias or rash. The illness began more than 7 days ago.  The illness does not include chills, anorexia, dysphagia, odynophagia, bloating, constipation, tenesmus, back pain or itching. Associated medical issues do not include inflammatory bowel disease, GERD, gallstones, liver disease, alcohol abuse, PUD, gastric bypass, bowel resection, irritable bowel syndrome, hemorrhoids or diverticulitis.    Review of Systems  Constitutional: Negative for chills, fatigue, fever and weight loss.  HENT: Negative for drooling, ear discharge, ear pain and sore throat.   Respiratory: Negative for cough, shortness of breath and wheezing.   Cardiovascular: Negative for chest pain, palpitations and leg swelling.  Gastrointestinal: Positive for hematochezia. Negative for abdominal pain, anorexia, bloating, blood in stool, constipation, diarrhea, dysphagia, hematemesis, jaundice, melena, nausea and vomiting.  Endocrine: Negative for polydipsia.  Genitourinary: Negative for dysuria, frequency, hematuria and urgency.  Musculoskeletal: Negative for arthralgias, back pain, myalgias and neck pain.  Skin: Negative for itching and rash.  Allergic/Immunologic: Negative for environmental allergies.  Neurological: Negative for dizziness and headaches.  Hematological: Does not bruise/bleed easily.  Psychiatric/Behavioral: Negative for suicidal ideas. The patient is not nervous/anxious.     Patient Active Problem List   Diagnosis Date Noted  . Symptomatic anemia 09/03/2018  . GI bleed 09/02/2018  . Diverticulosis of colon with  hemorrhage   . Acute gastrointestinal hemorrhage   . Hiatal hernia   . Stomach irritation   . Diverticulosis of small intestine without hemorrhage   . HTN (hypertension) 08/30/2018  . CKD (chronic kidney disease), stage III ([place]) 08/30/2018  . Blood in stool 08/30/2018  . Deep vein thrombosis (DVT) of proximal vein of right lower extremity ([place]) 06/14/2017    Allergies  Allergen Reactions  . Aspirin Rash  . Sulfa Antibiotics Rash    Past Surgical History:  Procedure Laterality Date  . COLONOSCOPY  2014   cleared for 5 yrs  . COLONOSCOPY N/A 09/01/2018   Procedure: COLONOSCOPY;  Surgeon: Virgel Manifold, MD;  Location: [facility] ENDOSCOPY;  Service: Endoscopy;  Laterality: N/A;  . CRANIECTOMY FOR DEPRESSED SKULL FRACTURE    . ESOPHAGOGASTRODUODENOSCOPY N/A 09/01/2018   Procedure: ESOPHAGOGASTRODUODENOSCOPY (EGD);  Surgeon: Virgel Manifold, MD;  Location: [facility] ENDOSCOPY;  Service: Endoscopy;  Laterality: N/A;  . IVC FILTER REMOVAL N/A 07/03/2017   Procedure: IVC FILTER REMOVAL;  Surgeon: Algernon Huxley, MD;  Location: [place] [facility];  Service: Cardiovascular;  Laterality: N/A;  . PERIPHERAL VASCULAR THROMBECTOMY Right 05/22/2017   Procedure: PERIPHERAL VASCULAR THROMBECTOMY;  Surgeon: Algernon Huxley, MD;  Location: [place] [facility];  Service: Cardiovascular;  Laterality: Right;    Social History   Tobacco Use  . Smoking status: Former Research scientist (life sciences)  . Smokeless tobacco: Never Used  Substance Use Topics  . Alcohol use: Yes    Alcohol/week: 0.0 standard drinks  . Drug use: No     Medication list has been reviewed and updated.  No outpatient medications have been marked as taking for the 09/07/18 encounter (Office Visit) with Juline Patch, MD.    [facility] 2/9 Scores 09/07/2018 05/16/2017 04/10/2015  PHQ - 2 Score 0 0 0  PHQ- 9 Score  1 0 -    BP Readings from Last 3 Encounters:  09/05/18 125/72  09/02/18 (!) 148/80  08/17/18 132/80    Physical Exam Vitals signs and nursing  note reviewed.  HENT:     Head: Normocephalic.     Right Ear: Tympanic membrane, ear canal and external ear normal.     Left Ear: Tympanic membrane, ear canal and external ear normal.     Nose: Nose normal. No rhinorrhea.     Mouth/Throat:     Lips: Pink.     Mouth: Mucous membranes are moist. Mucous membranes are pale.     Pharynx: Oropharynx is clear. Uvula midline.  Eyes:     General: No scleral icterus.       Right eye: No discharge.        Left eye: No discharge.     Conjunctiva/sclera: Conjunctivae normal.     Pupils: Pupils are equal, round, and reactive to light.  Neck:     Musculoskeletal: Normal range of motion and neck supple.     Thyroid: No thyromegaly.     Vascular: No JVD.     Trachea: No tracheal deviation.  Cardiovascular:     Rate and Rhythm: Normal rate and regular rhythm.     Chest Wall: PMI is not displaced. No thrill.     Pulses: No decreased pulses.          Radial pulses are 2+ on the right side and 2+ on the left side.     Heart sounds: Normal heart sounds, S1 normal and S2 normal. Heart sounds not distant. No murmur. No systolic murmur. No diastolic murmur. No friction rub. No gallop. No S3 or S4 sounds.   Pulmonary:     Effort: Pulmonary effort is normal. No respiratory distress.     Breath sounds: Normal breath sounds. No wheezing or rales.  Abdominal:     General: Bowel sounds are normal.     Palpations: Abdomen is soft. There is no hepatomegaly, splenomegaly or mass.     Tenderness: There is no abdominal tenderness. There is no guarding or rebound.  Musculoskeletal: Normal range of motion.        General: No tenderness.     Right lower leg: No edema.     Left lower leg: No edema.  Lymphadenopathy:     Cervical: No cervical adenopathy.  Skin:    General: Skin is warm.     Capillary Refill: Capillary refill takes less than 2 seconds.     Findings: No rash.  Neurological:     Mental Status: He is alert and oriented to person, place, and time.      Cranial Nerves: No cranial nerve deficit.     Deep Tendon Reflexes: Reflexes are normal and symmetric.     Wt Readings from Last 3 Encounters:  09/07/18 164 lb (74.4 kg)  08/30/18 170 lb (77.1 kg)  08/17/18 177 lb (80.3 kg)    Ht 5\' 10"  (1.778 m)   Wt 164 lb (74.4 kg)   BMI 23.53 kg/m   Assessment and Plan: 1. Lower GI bleed Received call this morning from call system that patient was having an episode of blood passing per rectum this morning.  This was noted to be darker red than previously noted.  There was no stool that was noted.  And there appeared to be mostly some clots.  Patient was asymptomatic as far as dizziness.  Exam other than some paleness was noted patient was  not tachycardic nor any abdominal discomfort.  The static measurements were unremarkable for lying sitting standing.  Stat hemoglobin was noted to be 10.8.  This was conveyed to the patient at this time 11:36 AM.  And is instructed to return call or to ER if there is brisk bleeding or any symptoms of dizziness/near syncope.  2. Diverticula of colon Patient has a history of diverticula and it was noted that this was likely a bleed at the mouth or 1 of the diverticula.  Patient has not had any new bright red blood per rectum and with the stabilization of the hematocrit it is unlikely there is a continuance of bleeding per GI tract.

## 2018-09-09 ENCOUNTER — Other Ambulatory Visit: Payer: Self-pay | Admitting: Family Medicine

## 2018-09-09 DIAGNOSIS — M545 Low back pain, unspecified: Secondary | ICD-10-CM

## 2018-09-09 DIAGNOSIS — G8929 Other chronic pain: Secondary | ICD-10-CM

## 2018-09-11 ENCOUNTER — Ambulatory Visit (INDEPENDENT_AMBULATORY_CARE_PROVIDER_SITE_OTHER): Payer: Medicare Other | Admitting: Gastroenterology

## 2018-09-11 ENCOUNTER — Telehealth: Payer: Self-pay

## 2018-09-11 DIAGNOSIS — K5790 Diverticulosis of intestine, part unspecified, without perforation or abscess without bleeding: Secondary | ICD-10-CM | POA: Diagnosis not present

## 2018-09-11 NOTE — Progress Notes (Signed)
Vonda Antigua, MD [address]  [place]  [place], [place] [address]  Main: [phone]  Fax: [phone]   Primary Care Physician: Juline Patch, MD  Virtual Visit via Video Note  I connected with patient on 09/11/18 at 11:30 AM EDT by video (using [email or web address]) and verified that I am speaking with the correct person using two identifiers.   I discussed the limitations, risks, security and privacy concerns of performing an evaluation and management service by video and the availability of in person appointments. I also discussed with the patient that there may be a patient responsible charge related to this service. The patient expressed understanding and agreed to proceed.  Location of Patient: Home Location of Provider: Home Persons involved: Patient and provider only (Nursing staff checked in patient via phone but were not physically involved in the video interaction - see their notes)   History of Present Illness: Chief complaint: Follow-up from hospitalization for diverticular bleeding  HPI: Clayton [name] is a 84 y.o. male recently discharged on Sep 02, 2018 after evaluation of hematochezia.  Patient was found to have diverticulosis in the colon, and suspected diverticular bleeding based on evidence of recent blood in the colon.  EGD did not show any evidence of recent bleeding.  Culprit diverticulum that blood could not be found.  Subsequent RBC scan was positive and patient was transferred to [facility] for embolization by interventional radiology.  However, patient did not have any further bleeding after transfer to [facility] and embolization was not done.  Patient reports seeing minute amount of blood clots in the stool 4 days ago and contacted his primary care provider, Dr. Ronnald Ramp.  She evaluated the patient and noted no further bleeding and repeated a hemoglobin which has shown improvement from 8-10.  Since then patient has had one soft brown bowel movement  daily.  No melena.  Denies any abdominal pain, nausea vomiting, weight loss.  Does report that he did not eat or drink anything for 3 days while he was in the hospital and has had some loss of energy related to this but this has been improving since he has been home and is eating well.  No current outpatient medications on file.   No current facility-administered medications for this visit.     Allergies as of 09/11/2018 - Review Complete 09/07/2018  Allergen Reaction Noted   Aspirin Rash 06/06/2017   Sulfa antibiotics Rash 04/10/2015    Review of Systems:    All systems reviewed and negative except where noted in HPI.   Observations/Objective:  Labs: CMP     Component Value Date/Time   NA 141 09/05/2018 0605   NA 140 08/17/2018 1417   K 3.5 09/05/2018 0605   CL 111 09/05/2018 0605   CO2 22 09/05/2018 0605   GLUCOSE 93 09/05/2018 0605   BUN 12 09/05/2018 0605   BUN 15 08/17/2018 1417   CREATININE 1.05 09/05/2018 0605   CALCIUM 8.5 (L) 09/05/2018 0605   PROT 6.7 08/30/2018 1951   ALBUMIN 3.7 08/30/2018 1951   ALBUMIN 4.7 (H) 08/17/2018 1417   AST 21 08/30/2018 1951   ALT 18 08/30/2018 1951   ALKPHOS 48 08/30/2018 1951   BILITOT 0.6 08/30/2018 1951   GFRNONAA >60 09/05/2018 0605   GFRAA >60 09/05/2018 0605   Lab Results  Component Value Date   WBC 9.6 09/07/2018   HGB 10.8 (L) 09/07/2018   HCT 31.9 (L) 09/07/2018   MCV 101.6 (H) 09/07/2018  PLT 418 (H) 09/07/2018    Imaging Studies: Dg Lumbar Spine Complete  Result Date: 08/18/2018 CLINICAL DATA:  Low back pain.  No trauma. EXAM: LUMBAR SPINE - COMPLETE 4+ VIEW COMPARISON:  None. FINDINGS: Normal alignment of lumbar vertebral bodies. No loss of vertebral body height or disc height. No pars fracture. No subluxation. Degenerate spurring from L1-L3. Endplate sclerosis superiorly at L3. No subluxation. Atherosclerotic calcification of the aorta. IMPRESSION: 1. No acute findings of the lumbar spine. 2. Mild to  moderate disc osteophytic disease. 3.  Aortic Atherosclerosis (ICD10-I70.0). Electronically Signed   By: Suzy Bouchard M.D.   On: 08/18/2018 11:03   Nm Gi Blood Loss  Addendum Date: 09/02/2018   ADDENDUM REPORT: 09/02/2018 13:58 ADDENDUM: Critical Value/emergent results were called by telephone at the time of interpretation on 09/02/2018 at 1:45 pm to Dr. Tressia Miners, who verbally acknowledged these results. Electronically Signed   By: Ilona Sorrel M.D.   On: 09/02/2018 13:58   Result Date: 09/02/2018 CLINICAL DATA:  Inpatient. GI bleed with bright red and dark blood per rectum. EXAM: NUCLEAR MEDICINE GASTROINTESTINAL BLEEDING SCAN TECHNIQUE: Sequential abdominal images were obtained following intravenous administration of Tc-40m labeled red blood cells. RADIOPHARMACEUTICALS:  21.0 mCi Tc-4m pertechnetate in-vitro labeled red cells. COMPARISON:  04/30/2007 CT abdomen/pelvis. FINDINGS: During the second hour of imaging, there is curvilinear radiotracer activity in the left upper quadrant of the abdomen, which appears to demonstrate transit in the expected configuration of splenic flexure of the colon. No additional sites of active GI bleed. IMPRESSION: Findings are compatible with active GI bleed in the left upper quadrant of the abdomen, morphologically correlating with the splenic flexure of the colon. Marked diverticulosis was seen in the left colon on remote 2008 CT abdomen study. Critical Value/emergent results were called by telephone at the time of interpretation on 09/02/2018 at 12:51 pm to RN [facility], who verbally acknowledged these results. We are also trying to contact the ordering provider. Electronically Signed: By: Ilona Sorrel M.D. On: 09/02/2018 13:19   US Venous Img Lower Bilateral  Result Date: 09/01/2018 CLINICAL DATA:  Swelling, pain, GI bleed, history of DVT EXAM: BILATERAL LOWER EXTREMITY VENOUS DOPPLER ULTRASOUND TECHNIQUE: Gray-scale sonography with compression, as well as color  and duplex ultrasound, were performed to evaluate the deep venous system from the level of the common femoral vein through the popliteal and proximal calf veins. COMPARISON:  05/16/2017 FINDINGS: On the right, normal compressibility of the common femoral, superficial femoral veins. There is mural hypoechoic material in the distal popliteal vein, with continued flow signal through the lumen on color Doppler. Unremarkable visualized proximal calf veins. On the left, normal compressibility of common femoral, superficial femoral, and popliteal veins. Visualized calf veins unremarkable. No filling defects to suggest DVT on grayscale or color Doppler imaging. Doppler waveforms show normal direction of venous flow, normal respiratory phasicity and response to augmentation. IMPRESSION: 1. Probable post thrombotic change in the distal right popliteal vein. No evidence of acute DVT. 2. On the left, no femoropopliteal and no calf DVT in the visualized calf veins. If clinical symptoms are inconsistent or if there are persistent or worsening symptoms, further imaging (possibly involving the iliac veins) may be warranted. Electronically Signed   By: Lucrezia Europe M.D.   On: 09/01/2018 17:36    Assessment and Plan:   Clayton [name] is a 84 y.o. y/o male with recent admission for diverticular bleeding with no prior episodes of GI bleed in the past  Assessment and  Plan: Patient's diverticular bleeding resolved without any need for embolization and patient has been doing well since then  I have encouraged him to start taking MiraLAX at least 3 times a week if not every day to maintain soft stools and prevent further episodes of bleeding.  He states he will pick this up from the pharmacy and was started.  High-fiber diet also recommended and he verbalized understanding  He was asked to contact us if he sees any blood in his stool, or clots in the future and he verbalized understanding  We will repeat hemoglobin in 1  week from last check on May 8  Follow Up Instructions: Follow-up in 3 to 6 months   I discussed the assessment and treatment plan with the patient. The patient was provided an opportunity to ask questions and all were answered. The patient agreed with the plan and demonstrated an understanding of the instructions.   The patient was advised to call back or seek an in-person evaluation if the symptoms worsen or if the condition fails to improve as anticipated.  I provided 15 minutes of face-to-face time via video software during this encounter.   Virgel Manifold, MD  Speech recognition software was used to dictate this note.

## 2018-09-11 NOTE — Telephone Encounter (Signed)
Unable to contact patient to prepare chart for televisit.  LVM asking him to call office this am.  Thanks Sharyn Lull

## 2018-09-12 ENCOUNTER — Other Ambulatory Visit: Payer: Self-pay

## 2018-09-12 ENCOUNTER — Telehealth: Payer: Self-pay

## 2018-09-12 DIAGNOSIS — Z8719 Personal history of other diseases of the digestive system: Secondary | ICD-10-CM

## 2018-09-12 NOTE — Telephone Encounter (Signed)
Left message that he may go to the hospital ([facility]) lab in [place] to get his lab work done. To contact office if any questions.

## 2019-01-08 ENCOUNTER — Other Ambulatory Visit: Payer: Self-pay

## 2019-01-08 ENCOUNTER — Emergency Department: Payer: Medicare Other

## 2019-01-08 ENCOUNTER — Encounter: Payer: Self-pay | Admitting: Emergency Medicine

## 2019-01-08 ENCOUNTER — Emergency Department
Admission: EM | Admit: 2019-01-08 | Discharge: 2019-01-08 | Disposition: A | Discharge status: Home / Self Care | Payer: Medicare Other | Attending: Emergency Medicine | Admitting: Emergency Medicine

## 2019-01-08 ENCOUNTER — Ambulatory Visit (INDEPENDENT_AMBULATORY_CARE_PROVIDER_SITE_OTHER)
Admission: EM | Admit: 2019-01-08 | Discharge: 2019-01-08 | Disposition: A | Discharge status: Other Acute Inpatient Hospital | Payer: Medicare Other | Source: Home / Self Care

## 2019-01-08 DIAGNOSIS — R4182 Altered mental status, unspecified: Secondary | ICD-10-CM | POA: Diagnosis not present

## 2019-01-08 DIAGNOSIS — S199XXA Unspecified injury of neck, initial encounter: Secondary | ICD-10-CM | POA: Diagnosis not present

## 2019-01-08 DIAGNOSIS — S60911A Unspecified superficial injury of right wrist, initial encounter: Secondary | ICD-10-CM | POA: Diagnosis not present

## 2019-01-08 DIAGNOSIS — I129 Hypertensive chronic kidney disease with stage 1 through stage 4 chronic kidney disease, or unspecified chronic kidney disease: Secondary | ICD-10-CM | POA: Insufficient documentation

## 2019-01-08 DIAGNOSIS — S52571A Other intraarticular fracture of lower end of right radius, initial encounter for closed fracture: Secondary | ICD-10-CM

## 2019-01-08 DIAGNOSIS — N183 Chronic kidney disease, stage 3 (moderate): Secondary | ICD-10-CM | POA: Diagnosis not present

## 2019-01-08 DIAGNOSIS — S0511XA Contusion of eyeball and orbital tissues, right eye, initial encounter: Secondary | ICD-10-CM

## 2019-01-08 DIAGNOSIS — Y999 Unspecified external cause status: Secondary | ICD-10-CM | POA: Insufficient documentation

## 2019-01-08 DIAGNOSIS — R220 Localized swelling, mass and lump, head: Secondary | ICD-10-CM | POA: Diagnosis not present

## 2019-01-08 DIAGNOSIS — R51 Headache: Secondary | ICD-10-CM | POA: Diagnosis not present

## 2019-01-08 DIAGNOSIS — Y93H2 Activity, gardening and landscaping: Secondary | ICD-10-CM | POA: Diagnosis not present

## 2019-01-08 DIAGNOSIS — S6991XA Unspecified injury of right wrist, hand and finger(s), initial encounter: Secondary | ICD-10-CM

## 2019-01-08 DIAGNOSIS — S0510XA Contusion of eyeball and orbital tissues, unspecified eye, initial encounter: Secondary | ICD-10-CM | POA: Diagnosis not present

## 2019-01-08 DIAGNOSIS — Z87891 Personal history of nicotine dependence: Secondary | ICD-10-CM | POA: Diagnosis not present

## 2019-01-08 DIAGNOSIS — S0990XA Unspecified injury of head, initial encounter: Secondary | ICD-10-CM

## 2019-01-08 DIAGNOSIS — S0182XA Laceration with foreign body of other part of head, initial encounter: Secondary | ICD-10-CM | POA: Diagnosis not present

## 2019-01-08 DIAGNOSIS — Y929 Unspecified place or not applicable: Secondary | ICD-10-CM | POA: Insufficient documentation

## 2019-01-08 DIAGNOSIS — W010XXA Fall on same level from slipping, tripping and stumbling without subsequent striking against object, initial encounter: Secondary | ICD-10-CM | POA: Diagnosis not present

## 2019-01-08 DIAGNOSIS — S0181XA Laceration without foreign body of other part of head, initial encounter: Secondary | ICD-10-CM

## 2019-01-08 DIAGNOSIS — W1809XA Striking against other object with subsequent fall, initial encounter: Secondary | ICD-10-CM

## 2019-01-08 MED ORDER — BACITRACIN-NEOMYCIN-POLYMYXIN 400-5-5000 EX OINT
TOPICAL_OINTMENT | Freq: Once | CUTANEOUS | Status: AC
  Administered 2019-01-08: 1 via TOPICAL
  Filled 2019-01-08: qty 1

## 2019-01-08 MED ORDER — LIDOCAINE HCL (PF) 1 % IJ SOLN
5.0000 mL | Freq: Once | INTRAMUSCULAR | Status: AC
  Administered 2019-01-08: 5 mL via INTRADERMAL
  Filled 2019-01-08: qty 5

## 2019-01-08 NOTE — ED Triage Notes (Signed)
See note from muc.  He was pulling weeds about 6pm last night and fell forward hitting forehead on concrete.  No loc.  Has laceration.  Also injury to right wrist/hand with swelling and pain under right arm/rib area

## 2019-01-08 NOTE — ED Provider Notes (Signed)
[facility] ____________________________________________  Time seen: Approximately 8:59 AM  I have reviewed the triage vital signs and the nursing notes.   HISTORY  Chief Complaint Fall (DOI 01/07/19)   HPI Clayton [name] is a 84 y.o. male presenting with wife at bedside post head injury that occurred last night around 6 PM.  Reports he was doing yard work, pulling weeds and fell forward hitting his head on concrete.  Reports no loss of consciousness.  Has had some pain to that area last night, but no pain currently.  Also sustained injuries to right ribs and right wrist.  Wife expresses concern as patient is not responding to her as normal today, including slower to respond and find words.  Patient denies any changes to himself.  Denies dizziness, vision changes, nausea, headache or facial pain at this time.  Not currently on anticoagulation.  Had previous DVT with IVC filter with 2 admissions this past year for GI bleeds.  No recent fevers, chest pain or shortness of breath.  Juline Patch, MD: PCP   Past Medical History:  Diagnosis Date  . Basal cell carcinoma   . CKD (chronic kidney disease)   . DVT (deep venous thrombosis) ([place])   . HTN (hypertension)     Patient Active Problem List   Diagnosis Date Noted  . Symptomatic anemia 09/03/2018  . GI bleed 09/02/2018  . Diverticulosis of colon with hemorrhage   . Acute gastrointestinal hemorrhage   . Hiatal hernia   . Stomach irritation   . Diverticulosis of small intestine without hemorrhage   . HTN (hypertension) 08/30/2018  . CKD (chronic kidney disease), stage III ([place]) 08/30/2018  . Blood in stool 08/30/2018  . Deep vein thrombosis (DVT) of proximal vein of right lower extremity ([place]) 06/14/2017    Past Surgical History:  Procedure Laterality Date  . COLONOSCOPY  2014   cleared for 5 yrs  . COLONOSCOPY N/A 09/01/2018   Procedure: COLONOSCOPY;  Surgeon: Virgel Manifold, MD;  Location: [facility]  ENDOSCOPY;  Service: Endoscopy;  Laterality: N/A;  . CRANIECTOMY FOR DEPRESSED SKULL FRACTURE    . ESOPHAGOGASTRODUODENOSCOPY N/A 09/01/2018   Procedure: ESOPHAGOGASTRODUODENOSCOPY (EGD);  Surgeon: Virgel Manifold, MD;  Location: [facility] ENDOSCOPY;  Service: Endoscopy;  Laterality: N/A;  . IVC FILTER REMOVAL N/A 07/03/2017   Procedure: IVC FILTER REMOVAL;  Surgeon: Algernon Huxley, MD;  Location: [place] CV LAB;  Service: Cardiovascular;  Laterality: N/A;  . PERIPHERAL VASCULAR THROMBECTOMY Right 05/22/2017   Procedure: PERIPHERAL VASCULAR THROMBECTOMY;  Surgeon: Algernon Huxley, MD;  Location: [place] [facility] LAB;  Service: Cardiovascular;  Laterality: Right;     No current facility-administered medications for this encounter.  No current outpatient medications on file.  Allergies Aspirin and Sulfa antibiotics  Family History  Problem Relation Age of Onset  . Heart disease Father     Social History Social History   Tobacco Use  . Smoking status: Former Smoker    Quit date: 01/07/1949    Years since quitting: 70.0  . Smokeless tobacco: Never Used  Substance Use Topics  . Alcohol use: Yes    Alcohol/week: 10.0 standard drinks    Types: 10 Glasses of wine per week    Comment: 1-2 glasses of wine every evening  . Drug use: No    Review of Systems Constitutional: No fever Eyes: No visual changes. ENT: No sore throat. Cardiovascular: Denies chest pain. Respiratory: Denies shortness of breath. Gastrointestinal: No abdominal pain.  No nausea,  no vomiting.  No diarrhea.  No constipation. Genitourinary: Negative for dysuria. Musculoskeletal: Negative for back pain.  Positive right wrist pain. Skin: Positive right forehead laceration. Neurological: Positive head injury.  ____________________________________________   PHYSICAL EXAM:  VITAL SIGNS: ED Triage Vitals  Enc Vitals Group     BP 01/08/19 0840 (!) 164/86     Pulse Rate 01/08/19 0840 66     Resp 01/08/19 0840 18      Temp 01/08/19 0840 98 F (36.7 C)     Temp Source 01/08/19 0840 Oral     SpO2 01/08/19 0840 100 %     Weight 01/08/19 0841 160 lb (72.6 kg)     Height 01/08/19 0841 5\' 9"  (1.753 m)     Head Circumference --      Peak Flow --      Pain Score 01/08/19 0840 0     Pain Loc --      Pain Edu? --      Excl. in [place]? --     Constitutional: Alert and oriented. Well appearing and in no acute distress. Eyes: Conjunctivae are normal. PERRL.  ENT      Head: Normocephalic.  Cardiovascular: Normal rate, regular rhythm. Grossly normal heart sounds.  Good peripheral circulation. Respiratory: Normal respiratory effort without tachypnea nor retractions. Breath sounds are clear and equal bilaterally. No wheezes, rales, rhonchi. Musculoskeletal: No midline cervical, thoracic or lumbar tenderness to palpation.  Right lateral rib tenderness.  Right distal radius tenderness with mild wrist and hand edema. Neurologic:  Normal speech and language. No gross focal neurologic deficits are appreciated. No gait instability.  Skin:  Skin is warm, dry.  Except: Right forehead irregular laceration, no active bleeding, minimal tenderness. Psychiatric: Mood and affect are normal. Speech and behavior are normal. Patient exhibits appropriate insight and judgment   ___________________________________________   LABS (all labs ordered are listed, but only abnormal results are displayed)  Labs Reviewed - No data to display  PROCEDURES Procedures    INITIAL IMPRESSION / ASSESSMENT AND PLAN / ED COURSE  Pertinent labs & imaging results that were available during my care of the patient were reviewed by me and considered in my medical decision making (see chart for details).  Presenting with wife at bedside.  Head injury and fall last night.  Wife expresses concern as patient not responding to her is normal today.  Alert and oriented at this time.  No CT availability at this facility at this time.  Recommend further  evaluation in the ER and likely imaging.  Patient wife reports that she will take him directly to [facility] RN triage nurse called and given report.  Patient stable at discharge.  ____________________________________________   FINAL CLINICAL IMPRESSION(S) / ED DIAGNOSES  Final diagnoses:  Injury of head, initial encounter  Facial laceration, initial encounter  Injury of right wrist, initial encounter     ED Discharge Orders    None       Note: This dictation was prepared with Dragon dictation along with smaller phrase technology. Any transcriptional errors that result from this process are unintentional.         Marylene Land, NP 01/08/19 [phone]

## 2019-01-08 NOTE — Discharge Instructions (Addendum)
Go directly to emergency room as discussed.  °

## 2019-01-08 NOTE — ED Notes (Signed)
Pt reports that last night he fell off of a curb and fell onto the road hitting head causing laceration to right side of forehead - denies loss of consciousness, no change in vision, headache, dizziness

## 2019-01-08 NOTE — ED Triage Notes (Addendum)
Patient in today with his wife stating that he fell yesterday at ~6pm on a curb and hit his head. Patient having right wrist pain and swelling, laceration on his forehead and laceration on his right elbow. Patient denies LOC. Wife states that he is responding slowly to questions this morning which she states is not like him.

## 2019-01-08 NOTE — Discharge Instructions (Addendum)
Do not get the sutured area wet for 24 hours. After 24 hours, shower/bathe as usual and pat the area dry. Change the bandage 2 times per day and apply antibiotic ointment. Leave open to air when at no risk of getting the area dirty, but cover at night before bed. See your PCP or go to Urgent Care in 5 days for suture removal or sooner for signs or concern of infection.  

## 2019-01-08 NOTE — ED Provider Notes (Signed)
[facility] Emergency Department Provider Note ____________________________________________   None    (approximate)  I have reviewed the triage vital signs and the nursing notes.   HISTORY  Chief Complaint Fall, Laceration, and Head Injury  HPI Clayton [name] is a 84 y.o. male who presents to the emergency department for treatment and evaluation after mechanical, non-syncopal fall yesterday evening.  He was pulling some weeds and lost his balance and fell forward hitting his head on asphalt.  No loss of consciousness.  He has a laceration over the right eyebrow.  He also has swelling around the right eye and pain and swelling in the right wrist and forearm.  He went to [facility] urgent care this morning and was advised to come to the emergency department for further evaluation.         Past Medical History:  Diagnosis Date   Basal cell carcinoma    CKD (chronic kidney disease)    DVT (deep venous thrombosis) (HCC)    HTN (hypertension)     Patient Active Problem List   Diagnosis Date Noted   Symptomatic anemia 09/03/2018   GI bleed 09/02/2018   Diverticulosis of colon with hemorrhage    Acute gastrointestinal hemorrhage    Hiatal hernia    Stomach irritation    Diverticulosis of small intestine without hemorrhage    HTN (hypertension) 08/30/2018   CKD (chronic kidney disease), stage III ([place]) 08/30/2018   Blood in stool 08/30/2018   Deep vein thrombosis (DVT) of proximal vein of right lower extremity ([place]) 06/14/2017    Past Surgical History:  Procedure Laterality Date   COLONOSCOPY  2014   cleared for 5 yrs   COLONOSCOPY N/A 09/01/2018   Procedure: COLONOSCOPY;  Surgeon: Virgel Manifold, MD;  Location: [facility] ENDOSCOPY;  Service: Endoscopy;  Laterality: N/A;   CRANIECTOMY FOR DEPRESSED SKULL FRACTURE     ESOPHAGOGASTRODUODENOSCOPY N/A 09/01/2018   Procedure: ESOPHAGOGASTRODUODENOSCOPY (EGD);  Surgeon: Virgel Manifold, MD;  Location: [facility] ENDOSCOPY;  Service: Endoscopy;  Laterality: N/A;   IVC FILTER REMOVAL N/A 07/03/2017   Procedure: IVC FILTER REMOVAL;  Surgeon: Algernon Huxley, MD;  Location: [place] CV LAB;  Service: Cardiovascular;  Laterality: N/A;   PERIPHERAL VASCULAR THROMBECTOMY Right 05/22/2017   Procedure: PERIPHERAL VASCULAR THROMBECTOMY;  Surgeon: Algernon Huxley, MD;  Location: [place] [facility] LAB;  Service: Cardiovascular;  Laterality: Right;    Prior to Admission medications   Not on File    Allergies Aspirin and Sulfa antibiotics  Family History  Problem Relation Age of Onset   Heart disease Father     Social History Social History   Tobacco Use   Smoking status: Former Smoker    Quit date: 01/07/1949    Years since quitting: 70.0   Smokeless tobacco: Never Used  Substance Use Topics   Alcohol use: Yes    Alcohol/week: 10.0 standard drinks    Types: 10 Glasses of wine per week    Comment: 1-2 glasses of wine every evening   Drug use: No    Review of Systems  Constitutional: No fever/chills Eyes: No visual changes.  Positive for early ecchymosis and swelling around the right eye. ENT: No sore throat. Cardiovascular: Denies chest pain. Respiratory: Denies shortness of breath. Gastrointestinal: No abdominal pain.  No nausea, no vomiting.  No diarrhea.  No constipation. Genitourinary: Negative for dysuria. Musculoskeletal: Negative for back pain.  Negative for neck pain Skin: Negative for rash. Neurological: Positive for headaches,  negative for focal weakness or numbness. ____________________________________________   PHYSICAL EXAM:  VITAL SIGNS: ED Triage Vitals  Enc Vitals Group     BP 01/08/19 0941 (!) 199/64     Pulse Rate 01/08/19 0941 65     Resp 01/08/19 0941 16     Temp 01/08/19 0941 97.8 F (36.6 C)     Temp Source 01/08/19 0941 Oral     SpO2 01/08/19 0941 98 %     Weight 01/08/19 0941 180 lb (81.6 kg)     Height 01/08/19 0941 5'  9" (1.753 m)     Head Circumference --      Peak Flow --      Pain Score 01/08/19 0937 5     Pain Loc --      Pain Edu? --      Excl. in [place]? --     Constitutional: Alert and oriented. Well appearing and in no acute distress. Eyes: Conjunctivae are normal. PERRL. EOMI. no pain with extraocular eye movement. Head: Atraumatic.  No battle signs. Nose: No congestion/rhinnorhea.  No evidence of epistaxis Mouth/Throat: Mucous membranes are moist.  Oropharynx non-erythematous. Neck: No stridor.   Hematological/Lymphatic/Immunilogical: No cervical lymphadenopathy. Cardiovascular: Normal rate, regular rhythm. Grossly normal heart sounds.  Good peripheral circulation. Respiratory: Normal respiratory effort.  No retractions. Lungs CTAB. Gastrointestinal: Soft and nontender. No distention. No abdominal bruits. No CVA tenderness. Genitourinary: Exam deferred Musculoskeletal: Right wrist and lower forearm is swollen with early ecchymosis and deformity.  No lower extremity tenderness nor edema.  No joint effusions. Neurologic:  Normal speech and language. No gross focal neurologic deficits are appreciated. No gait instability. Skin:  Skin is warm, dry and intact. No rash noted. Psychiatric: Mood and affect are normal. Speech and behavior are normal.  ____________________________________________   LABS (all labs ordered are listed, but only abnormal results are displayed)  Labs Reviewed - No data to display ____________________________________________  EKG  Not indicated ____________________________________________  RADIOLOGY  ED MD interpretation:    CT of the head, maxillofacial bones, and cervical spine all negative for acute findings.  Image of the right wrist does show a nondisplaced distal radius fracture.  Official radiology report(s): Dg Wrist Complete Right  Result Date: 01/08/2019 CLINICAL DATA:  Wrist pain after fall EXAM: RIGHT WRIST - COMPLETE 3+ VIEW COMPARISON:   09/14/2016 FINDINGS: Nondisplaced intra-articular fracture of the distal radius. No appreciable articular surface depression or diastasis. Alignment at the DRUJ is unchanged from prior study. Carpal bones and carpal intraosseous spaces are intact. No additional fractures identified. Degenerative changes of the first MCP joint. IMPRESSION: Acute nondisplaced intra-articular fracture of the distal radius. Electronically Signed   By: Davina Poke M.D.   On: 01/08/2019 10:54   Ct Head Wo Contrast  Result Date: 01/08/2019 CLINICAL DATA:  Altered mental status after a fall in the yard last night with head trauma. Headache. EXAM: CT HEAD WITHOUT CONTRAST CT MAXILLOFACIAL WITHOUT CONTRAST CT CERVICAL SPINE WITHOUT CONTRAST TECHNIQUE: Multidetector CT imaging of the head, cervical spine, and maxillofacial structures were performed using the standard protocol without intravenous contrast. Multiplanar CT image reconstructions of the cervical spine and maxillofacial structures were also generated. COMPARISON:  CT scan dated 09/14/2016 FINDINGS: CT HEAD FINDINGS Brain: No evidence of acute infarction, hemorrhage, hydrocephalus, extra-axial collection or mass lesion/mass effect. There is diffuse cerebral cortical atrophy with secondary ventricular prominence consistent with age, unchanged. Vascular: No hyperdense vessel or unexpected calcification. Skull: Normal. Negative for fracture or focal lesion. Sinuses/Orbits: Normal.  Other: Slight soft tissue swelling over the right frontal bone. CT MAXILLOFACIAL FINDINGS Osseous: No fracture or mandibular dislocation. No destructive process. Orbits: Negative. No traumatic or inflammatory finding. Sinuses: Clear. Soft tissues: Slight soft tissue swelling just above the right superior orbital rim consistent with soft tissue contusion. CT CERVICAL SPINE FINDINGS Alignment: Normal. Skull base and vertebrae: No acute fracture. No primary bone lesion. Moderately severe left facet  arthritis at C3-4. Soft tissues and spinal canal: No prevertebral fluid or swelling. No visible canal hematoma. Chronic calcifications in the nuchal ligament of no significance. Disc levels: C2-3: Tiny central disc bulge with no neural impingement. C3-4: Tiny central disc bulge with no neural impingement. Moderate left foraminal stenosis moderately severe left facet arthritis. C4-5: Small central disc bulge without neural impingement. C5-6: Small central disc bulge with no neural impingement. Moderate right facet arthritis. C6-7: Negative. C7-T1: Negative. T1-2: Negative. Upper chest: Negative. Other: None IMPRESSION: 1. No acute intracranial abnormality. Slight soft tissue swelling over the right frontal bone. 2. No acute abnormality of the maxillofacial structures. Soft tissue contusion over the right superior orbital rim. 3. No acute abnormality of the cervical spine. Multilevel degenerative facet arthritis. Electronically Signed   By: Lorriane Shire M.D.   On: 01/08/2019 10:50   Ct Cervical Spine Wo Contrast  Result Date: 01/08/2019 CLINICAL DATA:  Altered mental status after a fall in the yard last night with head trauma. Headache. EXAM: CT HEAD WITHOUT CONTRAST CT MAXILLOFACIAL WITHOUT CONTRAST CT CERVICAL SPINE WITHOUT CONTRAST TECHNIQUE: Multidetector CT imaging of the head, cervical spine, and maxillofacial structures were performed using the standard protocol without intravenous contrast. Multiplanar CT image reconstructions of the cervical spine and maxillofacial structures were also generated. COMPARISON:  CT scan dated 09/14/2016 FINDINGS: CT HEAD FINDINGS Brain: No evidence of acute infarction, hemorrhage, hydrocephalus, extra-axial collection or mass lesion/mass effect. There is diffuse cerebral cortical atrophy with secondary ventricular prominence consistent with age, unchanged. Vascular: No hyperdense vessel or unexpected calcification. Skull: Normal. Negative for fracture or focal lesion.  Sinuses/Orbits: Normal. Other: Slight soft tissue swelling over the right frontal bone. CT MAXILLOFACIAL FINDINGS Osseous: No fracture or mandibular dislocation. No destructive process. Orbits: Negative. No traumatic or inflammatory finding. Sinuses: Clear. Soft tissues: Slight soft tissue swelling just above the right superior orbital rim consistent with soft tissue contusion. CT CERVICAL SPINE FINDINGS Alignment: Normal. Skull base and vertebrae: No acute fracture. No primary bone lesion. Moderately severe left facet arthritis at C3-4. Soft tissues and spinal canal: No prevertebral fluid or swelling. No visible canal hematoma. Chronic calcifications in the nuchal ligament of no significance. Disc levels: C2-3: Tiny central disc bulge with no neural impingement. C3-4: Tiny central disc bulge with no neural impingement. Moderate left foraminal stenosis moderately severe left facet arthritis. C4-5: Small central disc bulge without neural impingement. C5-6: Small central disc bulge with no neural impingement. Moderate right facet arthritis. C6-7: Negative. C7-T1: Negative. T1-2: Negative. Upper chest: Negative. Other: None IMPRESSION: 1. No acute intracranial abnormality. Slight soft tissue swelling over the right frontal bone. 2. No acute abnormality of the maxillofacial structures. Soft tissue contusion over the right superior orbital rim. 3. No acute abnormality of the cervical spine. Multilevel degenerative facet arthritis. Electronically Signed   By: Lorriane Shire M.D.   On: 01/08/2019 10:50   Ct Maxillofacial Wo Contrast  Result Date: 01/08/2019 CLINICAL DATA:  Altered mental status after a fall in the yard last night with head trauma. Headache. EXAM: CT HEAD WITHOUT CONTRAST CT MAXILLOFACIAL  WITHOUT CONTRAST CT CERVICAL SPINE WITHOUT CONTRAST TECHNIQUE: Multidetector CT imaging of the head, cervical spine, and maxillofacial structures were performed using the standard protocol without intravenous contrast.  Multiplanar CT image reconstructions of the cervical spine and maxillofacial structures were also generated. COMPARISON:  CT scan dated 09/14/2016 FINDINGS: CT HEAD FINDINGS Brain: No evidence of acute infarction, hemorrhage, hydrocephalus, extra-axial collection or mass lesion/mass effect. There is diffuse cerebral cortical atrophy with secondary ventricular prominence consistent with age, unchanged. Vascular: No hyperdense vessel or unexpected calcification. Skull: Normal. Negative for fracture or focal lesion. Sinuses/Orbits: Normal. Other: Slight soft tissue swelling over the right frontal bone. CT MAXILLOFACIAL FINDINGS Osseous: No fracture or mandibular dislocation. No destructive process. Orbits: Negative. No traumatic or inflammatory finding. Sinuses: Clear. Soft tissues: Slight soft tissue swelling just above the right superior orbital rim consistent with soft tissue contusion. CT CERVICAL SPINE FINDINGS Alignment: Normal. Skull base and vertebrae: No acute fracture. No primary bone lesion. Moderately severe left facet arthritis at C3-4. Soft tissues and spinal canal: No prevertebral fluid or swelling. No visible canal hematoma. Chronic calcifications in the nuchal ligament of no significance. Disc levels: C2-3: Tiny central disc bulge with no neural impingement. C3-4: Tiny central disc bulge with no neural impingement. Moderate left foraminal stenosis moderately severe left facet arthritis. C4-5: Small central disc bulge without neural impingement. C5-6: Small central disc bulge with no neural impingement. Moderate right facet arthritis. C6-7: Negative. C7-T1: Negative. T1-2: Negative. Upper chest: Negative. Other: None IMPRESSION: 1. No acute intracranial abnormality. Slight soft tissue swelling over the right frontal bone. 2. No acute abnormality of the maxillofacial structures. Soft tissue contusion over the right superior orbital rim. 3. No acute abnormality of the cervical spine. Multilevel  degenerative facet arthritis. Electronically Signed   By: Lorriane Shire M.D.   On: 01/08/2019 10:50    ____________________________________________   PROCEDURES  Procedure(s) performed (including Critical Care):  Marland KitchenMarland KitchenLaceration Repair  Date/Time: 01/08/2019 11:49 AM Performed by: Victorino Dike, FNP Authorized by: Victorino Dike, FNP   Consent:    Consent obtained:  Verbal   Consent given by:  Patient   Risks discussed:  Pain, poor cosmetic result, poor wound healing and retained foreign body Anesthesia (see MAR for exact dosages):    Anesthesia method:  Local infiltration   Local anesthetic:  Lidocaine 1% w/o epi Laceration details:    Location:  Face   Face location:  Forehead   Length (cm):  3.5 Repair type:    Repair type:  Intermediate Pre-procedure details:    Preparation:  Patient was prepped and draped in usual sterile fashion Exploration:    Hemostasis achieved with:  Direct pressure   Wound extent: foreign bodies/material     Foreign bodies/material:  Dark flecks, likely asphalt Treatment:    Area cleansed with:  Betadine and saline   Amount of cleaning:  Standard   Irrigation method:  Syringe and tap   Visualized foreign bodies/material removed: yes   Skin repair:    Repair method:  Sutures   Suture size:  6-0   Suture material:  Prolene   Suture technique:  Simple interrupted   Number of sutures:  5 Approximation:    Approximation:  Close Post-procedure details:    Dressing:  Antibiotic ointment and sterile dressing   Patient tolerance of procedure:  Tolerated well, no immediate complications .Splint Application  Date/Time: 01/08/2019 11:51 AM Performed by: Victorino Dike, FNP Authorized by: Victorino Dike, FNP   Consent:  Consent obtained:  Verbal   Consent given by:  Patient   Risks discussed:  Discoloration, numbness, pain and swelling Pre-procedure details:    Sensation:  Normal Procedure details:    Laterality:  Right   Location:   Wrist   Splint type:  Volar short arm   Supplies:  Cotton padding, Ortho-Glass and elastic bandage Post-procedure details:    Pain:  Unchanged   Sensation:  Normal   Patient tolerance of procedure:  Tolerated well, no immediate complications    ____________________________________________   INITIAL IMPRESSION / ASSESSMENT AND PLAN     84 year old male presenting to the emergency department after mechanical, non-syncopal fall last night.  See HPI for further details.  CT scans of the head, maxillofacial bones, and cervical spine as well as right wrist have been ordered.  Plan will be to suture the laceration on the forehead for cosmetic reasons even though it is been several hours since the fall.  ED COURSE  CT scans are all reassuring.  He does have a nondisplaced fracture of the right wrist.  Volar OCL applied by ER tech.  Forehead laceration was repaired as above.  He will see his primary care provider in about 5 days to have the sutures removed and he will call to schedule an appointment with orthopedics for follow-up on the wrist.  He will take Tylenol if needed.  He was advised to return to the emergency department for any symptom of concern if he is unable to see his primary care provider. ____________________________________________   FINAL CLINICAL IMPRESSION(S) / ED DIAGNOSES  Final diagnoses:  Injury of head, initial encounter  Facial laceration, initial encounter  Other closed intra-articular fracture of distal end of right radius, initial encounter  Contusion of right eye, initial encounter     ED Discharge Orders    None       Note:  This document was prepared using Dragon voice recognition software and may include unintentional dictation errors.    Victorino Dike, FNP 01/08/19 1433    Lavonia Drafts, MD 01/08/19 1501

## 2019-01-09 ENCOUNTER — Telehealth: Payer: Self-pay | Admitting: Family Medicine

## 2019-01-09 ENCOUNTER — Other Ambulatory Visit: Payer: Self-pay

## 2019-01-09 ENCOUNTER — Ambulatory Visit (INDEPENDENT_AMBULATORY_CARE_PROVIDER_SITE_OTHER): Payer: Medicare Other

## 2019-01-09 ENCOUNTER — Ambulatory Visit
Admission: EM | Admit: 2019-01-09 | Discharge: 2019-01-09 | Disposition: A | Discharge status: Home / Self Care | Payer: Medicare Other | Attending: Family Medicine | Admitting: Family Medicine

## 2019-01-09 DIAGNOSIS — W19XXXD Unspecified fall, subsequent encounter: Secondary | ICD-10-CM

## 2019-01-09 DIAGNOSIS — W01198A Fall on same level from slipping, tripping and stumbling with subsequent striking against other object, initial encounter: Secondary | ICD-10-CM

## 2019-01-09 DIAGNOSIS — S2241XA Multiple fractures of ribs, right side, initial encounter for closed fracture: Secondary | ICD-10-CM | POA: Diagnosis not present

## 2019-01-09 DIAGNOSIS — R0789 Other chest pain: Secondary | ICD-10-CM

## 2019-01-09 MED ORDER — TRAMADOL HCL 50 MG PO TABS: 50.0000 mg | ORAL_TABLET | Freq: Two times a day (BID) | ORAL | 0 refills | Status: DC | PRN

## 2019-01-09 NOTE — ED Provider Notes (Addendum)
[facility] ____________________________________________  Time seen: Approximately 11:34 AM  I have reviewed the triage vital signs and the nursing notes.   HISTORY  Chief Complaint Fall   HPI Clayton [name] is a 84 y.o. male presenting with wife at bedside for reevaluation after a mechanical fall.  2 days ago patient was working in his yard and while pulling weeds he fell forward hitting his head and face on concrete.  Patient was seen in urgent care as well as the ER yesterday for the same.  Patient had injuries to right forehead, right wrist as well as right ribs. No LOC.  Patient had CT head, maxillofacial and cervical which were overall negative except for contusion swelling.  Patient laceration was repaired.  Patient had a right radius fracture and is currently in a splint.  Patient coming back in today as he reports he has had continued right upper rib pain from the fall which has not been x-rayed.  Patient states when he fell and he fell on his right arm he also hit his right ribs and has had pain to touch of his right ribs as well as to lay on the right ribs since the fall.  Denies chest pain, shortness of breath, paresthesias, confusion, vision changes, syncope, near syncope or other complaints.  Reports otherwise doing well.  Has been taking Advil and Tylenol which helps some but states he had difficulty sleeping due to pain.  Juline Patch, MD: PCP   Past Medical History:  Diagnosis Date  . Basal cell carcinoma   . CKD (chronic kidney disease)   . DVT (deep venous thrombosis) ([place])   . HTN (hypertension)     Patient Active Problem List   Diagnosis Date Noted  . Symptomatic anemia 09/03/2018  . GI bleed 09/02/2018  . Diverticulosis of colon with hemorrhage   . Acute gastrointestinal hemorrhage   . Hiatal hernia   . Stomach irritation   . Diverticulosis of small intestine without hemorrhage   . HTN (hypertension) 08/30/2018  . CKD (chronic kidney  disease), stage III ([place]) 08/30/2018  . Blood in stool 08/30/2018  . Deep vein thrombosis (DVT) of proximal vein of right lower extremity ([place]) 06/14/2017    Past Surgical History:  Procedure Laterality Date  . COLONOSCOPY  2014   cleared for 5 yrs  . COLONOSCOPY N/A 09/01/2018   Procedure: COLONOSCOPY;  Surgeon: Virgel Manifold, MD;  Location: [facility] ENDOSCOPY;  Service: Endoscopy;  Laterality: N/A;  . CRANIECTOMY FOR DEPRESSED SKULL FRACTURE    . ESOPHAGOGASTRODUODENOSCOPY N/A 09/01/2018   Procedure: ESOPHAGOGASTRODUODENOSCOPY (EGD);  Surgeon: Virgel Manifold, MD;  Location: [facility] ENDOSCOPY;  Service: Endoscopy;  Laterality: N/A;  . IVC FILTER REMOVAL N/A 07/03/2017   Procedure: IVC FILTER REMOVAL;  Surgeon: Algernon Huxley, MD;  Location: [place] [facility];  Service: Cardiovascular;  Laterality: N/A;  . PERIPHERAL VASCULAR THROMBECTOMY Right 05/22/2017   Procedure: PERIPHERAL VASCULAR THROMBECTOMY;  Surgeon: Algernon Huxley, MD;  Location: [place] [facility];  Service: Cardiovascular;  Laterality: Right;     No current facility-administered medications for this encounter.   Current Outpatient Medications:  .  traMADol (ULTRAM) 50 MG tablet, Take 1 tablet (50 mg total) by mouth every 12 (twelve) hours as needed for severe pain., Disp: 8 tablet, Rfl: 0  Allergies Aspirin and Sulfa antibiotics  Family History  Problem Relation Age of Onset  . Heart disease Father     Social History Social History   Tobacco  Use  . Smoking status: Former Smoker    Quit date: 01/07/1949    Years since quitting: 70.0  . Smokeless tobacco: Never Used  Substance Use Topics  . Alcohol use: Yes    Alcohol/week: 10.0 standard drinks    Types: 10 Glasses of wine per week    Comment: 1-2 glasses of wine every evening  . Drug use: No    Review of Systems Constitutional: No fever Cardiovascular: Denies chest pain. Respiratory: Denies shortness of breath. Gastrointestinal: No abdominal pain.  No  nausea, no vomiting.  No diarrhea.   Musculoskeletal: Positive right rib pain. Negative for neck or back pain.  Skin: Negative for rash. Neurological: Negative for headaches, focal weakness or numbness.    ____________________________________________   PHYSICAL EXAM:  VITAL SIGNS: ED Triage Vitals  Enc Vitals Group     BP 01/09/19 0934 (!) 177/74     Pulse Rate 01/09/19 0934 85     Resp 01/09/19 0934 18     Temp 01/09/19 0934 98.2 F (36.8 C)     Temp Source 01/09/19 0934 Oral     SpO2 01/09/19 0934 100 %     Weight 01/09/19 0935 179 lb 14.3 oz (81.6 kg)     Height --      Head Circumference --      Peak Flow --      Pain Score 01/09/19 0935 5     Pain Loc --      Pain Edu? --      Excl. in [place]? --     Constitutional: Alert and oriented. Well appearing and in no acute distress. ENT      Head: Right forehead ecchymosis with sutures present, well approximated, no drainage. Cardiovascular: Normal rate, regular rhythm. Grossly normal heart sounds.  Good peripheral circulation. Respiratory: Normal respiratory effort without tachypnea nor retractions. Breath sounds are clear and equal bilaterally. No wheezes, rales, rhonchi. Gastrointestinal: Soft and nontender Musculoskeletal:   No midline cervical, thoracic or lumbar tenderness to palpation.  OCL splint to right forearm. Except: Lateral upper Narang second through fifth ribs tenderness to direct palpation, no palpable rib fracture, right shoulder nontender and with full range of motion present. Neurologic:  Normal speech and language. No gross focal neurologic deficits are appreciated. Speech is normal. No gait instability.  Skin:  Skin is warm, dry Psychiatric: Mood and affect are normal. Speech and behavior are normal. Patient exhibits appropriate insight and judgment   ___________________________________________   LABS (all labs ordered are listed, but only abnormal results are displayed)  Labs Reviewed - No data to  display ____________________________________________   RADIOLOGY  Dg Ribs Unilateral W/chest Right  Result Date: 01/09/2019 CLINICAL DATA:  [place] 3 days ago and fractured right wrist. Upper right chest pain. EXAM: RIGHT RIBS AND CHEST - 3+ VIEW COMPARISON:  Chest x-ray 08/30/2011 FINDINGS: The cardiac silhouette, mediastinal and hilar contours are within normal limits and stable. There is mild tortuosity of the thoracic aorta. The lungs are clear of an acute process. No infiltrates, edema or effusion. No pneumothorax. There is a moderate-sized hiatal hernia noted. Dedicated views of the right ribs demonstrate nondisplaced fractures involving the anterior third and fourth ribs. I do not see any other definite acute rib fractures. IMPRESSION: 1. Nondisplaced fractures involving the right third and fourth anterior ribs. 2. No acute cardiopulmonary findings. Electronically Signed   By: Marijo Sanes M.D.   On: 01/09/2019 10:26   ____________________________________________   PROCEDURES Procedures  INITIAL IMPRESSION / ASSESSMENT AND PLAN / ED COURSE  Pertinent labs & imaging results that were available during my care of the patient were reviewed by me and considered in my medical decision making (see chart for details).  Overall well-appearing patient.  Wife at bedside.  Patient has a follow-up for orthopedic this Friday for right wrist fracture.  Patient presenting again due to rib pain that had not been evaluated from the fall.  Denies chest pain or shortness of breath.  States pain is only with direct palpation or laying on that side.  X-ray as above per radiologist, nondisplaced fractures involving the right third and fourth anterior ribs, no cardiopulmonary findings.  Counseled regarding frequent deep breaths, ice, monitoring, avoidance of strenuous activity.  Follow-up with primary care.  Continue Tylenol as needed for pain, and discussed use of tramadol, patient and wife requests Rx,  supportive care.Discussed indication, risks and benefits of medications with patient.  Discussed follow up and return parameters including no resolution or any worsening concerns. Patient verbalized understanding and agreed to plan.   [place] controlled substance database reviewed, no recent controlled substances. ____________________________________________   FINAL CLINICAL IMPRESSION(S) / ED DIAGNOSES  Final diagnoses:  Closed fracture of multiple ribs of right side, initial encounter  Fall, subsequent encounter     ED Discharge Orders         Ordered    traMADol (ULTRAM) 50 MG tablet  Every 12 hours PRN     01/09/19 1038           Note: This dictation was prepared with Dragon dictation along with smaller phrase technology. Any transcriptional errors that result from this process are unintentional.         Marylene Land, NP 01/09/19 [place], Craig, NP 01/09/19 1141

## 2019-01-09 NOTE — Telephone Encounter (Signed)
Pt fell had 5 stitches put in on forehead, wants to set up appt to have then taken out here. Can we set him up for that? Please advise? Wants to come in on the 14th since she has an appt that day

## 2019-01-09 NOTE — Discharge Instructions (Addendum)
Take medication as prescribed. Rest. Drink plenty of fluids.  ° °Follow up with your primary care physician this week as needed. Return to Urgent care for new or worsening concerns.  ° °

## 2019-01-09 NOTE — ED Triage Notes (Signed)
Pt states he is having right sided chest pain slightly below his right shoulder. Pain is constant and worse when he moves. Pt states he is here to get fixed.

## 2019-01-10 NOTE — Telephone Encounter (Signed)
Best to get done where he got them put in, if ER.Marland Kitchengo to urgent care. Explain to them that this is an insurance concern, they will not pay for [place] to take them out

## 2019-01-11 DIAGNOSIS — S52501A Unspecified fracture of the lower end of right radius, initial encounter for closed fracture: Secondary | ICD-10-CM | POA: Diagnosis not present

## 2019-01-17 DIAGNOSIS — S52502D Unspecified fracture of the lower end of left radius, subsequent encounter for closed fracture with routine healing: Secondary | ICD-10-CM | POA: Diagnosis not present

## 2019-01-31 DIAGNOSIS — S52502D Unspecified fracture of the lower end of left radius, subsequent encounter for closed fracture with routine healing: Secondary | ICD-10-CM | POA: Diagnosis not present

## 2019-02-20 ENCOUNTER — Other Ambulatory Visit: Payer: Self-pay

## 2019-02-20 ENCOUNTER — Encounter: Payer: Self-pay | Admitting: Family Medicine

## 2019-02-20 ENCOUNTER — Ambulatory Visit (INDEPENDENT_AMBULATORY_CARE_PROVIDER_SITE_OTHER): Payer: Medicare Other | Admitting: Family Medicine

## 2019-02-20 VITALS — BP 124/77 | HR 72 | Resp 16 | Ht 69.0 in | Wt 171.9 lb

## 2019-02-20 DIAGNOSIS — M5136 Other intervertebral disc degeneration, lumbar region: Secondary | ICD-10-CM

## 2019-02-20 MED ORDER — ETODOLAC 200 MG PO CAPS: 200.0000 mg | ORAL_CAPSULE | Freq: Two times a day (BID) | ORAL | 1 refills | Status: DC

## 2019-02-20 MED ORDER — CYCLOBENZAPRINE HCL 5 MG PO TABS: 5.0000 mg | ORAL_TABLET | Freq: Two times a day (BID) | ORAL | 1 refills | Status: DC | PRN

## 2019-02-20 NOTE — Progress Notes (Signed)
Date:  02/20/2019   Name:  Clayton [name]   DOB:  06-23-1934   MRN:  [id number]   Chief Complaint: Back Pain (lower back pain intense past few weeks) and Hip Pain (L/R PAIN x 12 mo )  Back Pain This is a recurrent problem. The current episode started more than 1 month ago. The problem occurs daily. The problem has been waxing and waning since onset. The pain is present in the lumbar spine and gluteal. The quality of the pain is described as aching. Radiates to: buttock/bilateral. The pain is at a severity of 10/10. The pain is severe. The pain is worse during the night. Pertinent negatives include no abdominal pain, bladder incontinence, bowel incontinence, chest pain, dysuria, fever, headaches, leg pain, numbness, paresis, paresthesias, tingling or weakness.  Hip Pain  Pertinent negatives include no numbness or tingling.    Review of Systems  Constitutional: Negative for chills and fever.  HENT: Negative for drooling, ear discharge, ear pain and sore throat.   Respiratory: Negative for cough, shortness of breath and wheezing.   Cardiovascular: Negative for chest pain, palpitations and leg swelling.  Gastrointestinal: Negative for abdominal pain, blood in stool, bowel incontinence, constipation, diarrhea and nausea.  Endocrine: Negative for polydipsia.  Genitourinary: Negative for bladder incontinence, dysuria, frequency, hematuria and urgency.  Musculoskeletal: Positive for back pain. Negative for myalgias and neck pain.  Skin: Negative for rash.  Allergic/Immunologic: Negative for environmental allergies.  Neurological: Negative for dizziness, tingling, weakness, numbness, headaches and paresthesias.  Hematological: Does not bruise/bleed easily.  Psychiatric/Behavioral: Negative for suicidal ideas. The patient is not nervous/anxious.     Patient Active Problem List   Diagnosis Date Noted  . Symptomatic anemia 09/03/2018  . GI bleed 09/02/2018  . Diverticulosis of colon  with hemorrhage   . Acute gastrointestinal hemorrhage   . Hiatal hernia   . Stomach irritation   . Diverticulosis of small intestine without hemorrhage   . HTN (hypertension) 08/30/2018  . CKD (chronic kidney disease), stage III 08/30/2018  . Blood in stool 08/30/2018  . Deep vein thrombosis (DVT) of proximal vein of right lower extremity ([place]) 06/14/2017    Allergies  Allergen Reactions  . Aspirin Rash  . Sulfa Antibiotics Rash    Past Surgical History:  Procedure Laterality Date  . COLONOSCOPY  2014   cleared for 5 yrs  . COLONOSCOPY N/A 09/01/2018   Procedure: COLONOSCOPY;  Surgeon: Virgel Manifold, MD;  Location: [facility] ENDOSCOPY;  Service: Endoscopy;  Laterality: N/A;  . CRANIECTOMY FOR DEPRESSED SKULL FRACTURE    . ESOPHAGOGASTRODUODENOSCOPY N/A 09/01/2018   Procedure: ESOPHAGOGASTRODUODENOSCOPY (EGD);  Surgeon: Virgel Manifold, MD;  Location: [facility] ENDOSCOPY;  Service: Endoscopy;  Laterality: N/A;  . IVC FILTER REMOVAL N/A 07/03/2017   Procedure: IVC FILTER REMOVAL;  Surgeon: Algernon Huxley, MD;  Location: [place] [facility];  Service: Cardiovascular;  Laterality: N/A;  . PERIPHERAL VASCULAR THROMBECTOMY Right 05/22/2017   Procedure: PERIPHERAL VASCULAR THROMBECTOMY;  Surgeon: Algernon Huxley, MD;  Location: [place] [facility];  Service: Cardiovascular;  Laterality: Right;    Social History   Tobacco Use  . Smoking status: Former Smoker    Quit date: 01/07/1949    Years since quitting: 70.1  . Smokeless tobacco: Never Used  Substance Use Topics  . Alcohol use: Yes    Alcohol/week: 10.0 standard drinks    Types: 10 Glasses of wine per week    Comment: 1-2 glasses of wine every evening  .  Drug use: No     Medication list has been reviewed and updated.  Current Meds  Medication Sig  . acetaminophen (TYLENOL) 325 MG tablet Take 650 mg by mouth every 6 (six) hours as needed.  . Multiple Vitamin (MULTIVITAMIN) capsule Take 1 capsule by mouth daily.    PHQ 2/9  Scores 02/20/2019 09/07/2018 05/16/2017 04/10/2015  PHQ - 2 Score 0 0 0 0  PHQ- 9 Score - 1 0 -    BP Readings from Last 3 Encounters:  02/20/19 124/77  01/09/19 (!) 177/74  01/08/19 (!) 175/73    Physical Exam Vitals signs and nursing note reviewed.  HENT:     Head: Normocephalic.     Right Ear: Tympanic membrane, ear canal and external ear normal.     Left Ear: Tympanic membrane, ear canal and external ear normal.     Nose: Nose normal.  Eyes:     General: No scleral icterus.       Right eye: No discharge.        Left eye: No discharge.     Conjunctiva/sclera: Conjunctivae normal.     Pupils: Pupils are equal, round, and reactive to light.  Neck:     Musculoskeletal: Normal range of motion and neck supple.     Thyroid: No thyromegaly.     Vascular: No JVD.     Trachea: No tracheal deviation.  Cardiovascular:     Rate and Rhythm: Normal rate and regular rhythm.     Pulses: Normal pulses.     Heart sounds: Normal heart sounds, S1 normal and S2 normal. No murmur. No systolic murmur. No diastolic murmur. No friction rub. No gallop. No S3 or S4 sounds.   Pulmonary:     Effort: No respiratory distress.     Breath sounds: Normal breath sounds. No wheezing or rales.  Abdominal:     General: Bowel sounds are normal.     Palpations: Abdomen is soft. There is no hepatomegaly, splenomegaly or mass.     Tenderness: There is no abdominal tenderness. There is no guarding or rebound.  Genitourinary:    Prostate: Normal. Not enlarged, not tender and no nodules present.     Rectum: Normal. Guaiac result negative.  Musculoskeletal: Normal range of motion.        General: No tenderness.     Right lower leg: No edema.     Left lower leg: No edema.  Lymphadenopathy:     Cervical: No cervical adenopathy.  Skin:    General: Skin is warm.     Findings: No rash.  Neurological:     Mental Status: He is alert and oriented to person, place, and time.     Cranial Nerves: No cranial nerve  deficit.     Deep Tendon Reflexes: Reflexes are normal and symmetric.     Wt Readings from Last 3 Encounters:  02/20/19 171 lb 14.4 oz (78 kg)  01/09/19 179 lb 14.3 oz (81.6 kg)  01/08/19 180 lb (81.6 kg)    BP 124/77   Pulse 72   Resp 16   Ht 5\' 9"  (1.753 m)   Wt 171 lb 14.4 oz (78 kg)   SpO2 99%   BMI 25.39 kg/m   Assessment and Plan: 1. Degenerative disc disease, lumbar Chronic.  Recurrent.  Complicated.  Patient had an upper GI bleed but he was on Eliquis at the time and had been on NSAIDs.  Patient was hospitalized and stabilized but patient returns today with similar  symptoms that he had earlier last year.  Patient did well on meloxicam and cyclobenzaprine and back pain is gotten to the point that is become a quality of life issue.  Review of x-ray notes that there is osteophytes and degeneration of the lumbar aspect of the spine.  We will cautiously go with a lower dose of etodolac 200 mg twice a day and 5 mg of cyclobenzaprine to limit the cognitive concern.  Patient has been instructed if he sees any melena or has any abdominal discomfort to see [place] immediately.  I did do a rectal exam and there was no blood noted on exam. - etodolac (LODINE) 200 MG capsule; Take 1 capsule (200 mg total) by mouth 2 (two) times daily.  Dispense: 30 capsule; Refill: 1 - cyclobenzaprine (FLEXERIL) 5 MG tablet; Take 1 tablet (5 mg total) by mouth 2 (two) times daily as needed for muscle spasms.  Dispense: 30 tablet; Refill: 1 .

## 2019-02-20 NOTE — Patient Instructions (Signed)

## 2019-02-26 ENCOUNTER — Telehealth: Payer: Self-pay | Admitting: Family Medicine

## 2019-02-26 ENCOUNTER — Other Ambulatory Visit: Payer: Self-pay

## 2019-02-26 DIAGNOSIS — M545 Low back pain, unspecified: Secondary | ICD-10-CM

## 2019-02-26 DIAGNOSIS — G8929 Other chronic pain: Secondary | ICD-10-CM

## 2019-02-26 NOTE — Progress Notes (Unsigned)
Ref to ortho

## 2019-02-26 NOTE — Telephone Encounter (Signed)
Patient is still having some pain in his hip area. Dr Ronnald Ramp prescribed him of cyclobenzaprine (FLEXERIL) 5 MG tablet [id number]  etodolac (LODINE) 200 MG capsule [id number]

## 2019-02-26 NOTE — Telephone Encounter (Signed)
Put in referral to ortho as well as left a VM on his cell telling him to expect a call concerning ortho appt

## 2019-02-27 DIAGNOSIS — H2513 Age-related nuclear cataract, bilateral: Secondary | ICD-10-CM | POA: Diagnosis not present

## 2019-03-06 DIAGNOSIS — M48062 Spinal stenosis, lumbar region with neurogenic claudication: Secondary | ICD-10-CM | POA: Diagnosis not present

## 2019-03-06 DIAGNOSIS — M545 Low back pain: Secondary | ICD-10-CM | POA: Diagnosis not present

## 2019-03-06 DIAGNOSIS — M5136 Other intervertebral disc degeneration, lumbar region: Secondary | ICD-10-CM | POA: Diagnosis not present

## 2019-03-12 NOTE — Telephone Encounter (Signed)
Patient was seen 02/20/2019 informed.

## 2019-04-09 DIAGNOSIS — M9902 Segmental and somatic dysfunction of thoracic region: Secondary | ICD-10-CM | POA: Diagnosis not present

## 2019-04-09 DIAGNOSIS — M9903 Segmental and somatic dysfunction of lumbar region: Secondary | ICD-10-CM | POA: Diagnosis not present

## 2019-04-09 DIAGNOSIS — M5136 Other intervertebral disc degeneration, lumbar region: Secondary | ICD-10-CM | POA: Diagnosis not present

## 2019-04-09 DIAGNOSIS — M461 Sacroiliitis, not elsewhere classified: Secondary | ICD-10-CM | POA: Diagnosis not present

## 2019-04-10 DIAGNOSIS — M9902 Segmental and somatic dysfunction of thoracic region: Secondary | ICD-10-CM | POA: Diagnosis not present

## 2019-04-10 DIAGNOSIS — M5136 Other intervertebral disc degeneration, lumbar region: Secondary | ICD-10-CM | POA: Diagnosis not present

## 2019-04-10 DIAGNOSIS — M461 Sacroiliitis, not elsewhere classified: Secondary | ICD-10-CM | POA: Diagnosis not present

## 2019-04-10 DIAGNOSIS — M9903 Segmental and somatic dysfunction of lumbar region: Secondary | ICD-10-CM | POA: Diagnosis not present

## 2019-04-12 DIAGNOSIS — M9902 Segmental and somatic dysfunction of thoracic region: Secondary | ICD-10-CM | POA: Diagnosis not present

## 2019-04-12 DIAGNOSIS — M461 Sacroiliitis, not elsewhere classified: Secondary | ICD-10-CM | POA: Diagnosis not present

## 2019-04-12 DIAGNOSIS — M9903 Segmental and somatic dysfunction of lumbar region: Secondary | ICD-10-CM | POA: Diagnosis not present

## 2019-04-12 DIAGNOSIS — M5136 Other intervertebral disc degeneration, lumbar region: Secondary | ICD-10-CM | POA: Diagnosis not present

## 2019-04-15 DIAGNOSIS — M5136 Other intervertebral disc degeneration, lumbar region: Secondary | ICD-10-CM | POA: Diagnosis not present

## 2019-04-15 DIAGNOSIS — M9902 Segmental and somatic dysfunction of thoracic region: Secondary | ICD-10-CM | POA: Diagnosis not present

## 2019-04-15 DIAGNOSIS — M9903 Segmental and somatic dysfunction of lumbar region: Secondary | ICD-10-CM | POA: Diagnosis not present

## 2019-04-15 DIAGNOSIS — M461 Sacroiliitis, not elsewhere classified: Secondary | ICD-10-CM | POA: Diagnosis not present

## 2019-04-19 DIAGNOSIS — M461 Sacroiliitis, not elsewhere classified: Secondary | ICD-10-CM | POA: Diagnosis not present

## 2019-04-19 DIAGNOSIS — M5136 Other intervertebral disc degeneration, lumbar region: Secondary | ICD-10-CM | POA: Diagnosis not present

## 2019-04-19 DIAGNOSIS — M9902 Segmental and somatic dysfunction of thoracic region: Secondary | ICD-10-CM | POA: Diagnosis not present

## 2019-04-19 DIAGNOSIS — M9903 Segmental and somatic dysfunction of lumbar region: Secondary | ICD-10-CM | POA: Diagnosis not present

## 2019-04-24 DIAGNOSIS — M9902 Segmental and somatic dysfunction of thoracic region: Secondary | ICD-10-CM | POA: Diagnosis not present

## 2019-04-24 DIAGNOSIS — M9903 Segmental and somatic dysfunction of lumbar region: Secondary | ICD-10-CM | POA: Diagnosis not present

## 2019-04-24 DIAGNOSIS — M5136 Other intervertebral disc degeneration, lumbar region: Secondary | ICD-10-CM | POA: Diagnosis not present

## 2019-04-24 DIAGNOSIS — M461 Sacroiliitis, not elsewhere classified: Secondary | ICD-10-CM | POA: Diagnosis not present

## 2019-05-07 DIAGNOSIS — M5136 Other intervertebral disc degeneration, lumbar region: Secondary | ICD-10-CM | POA: Diagnosis not present

## 2019-05-07 DIAGNOSIS — M461 Sacroiliitis, not elsewhere classified: Secondary | ICD-10-CM | POA: Diagnosis not present

## 2019-05-07 DIAGNOSIS — M9903 Segmental and somatic dysfunction of lumbar region: Secondary | ICD-10-CM | POA: Diagnosis not present

## 2019-05-07 DIAGNOSIS — M9902 Segmental and somatic dysfunction of thoracic region: Secondary | ICD-10-CM | POA: Diagnosis not present

## 2019-05-10 DIAGNOSIS — M9903 Segmental and somatic dysfunction of lumbar region: Secondary | ICD-10-CM | POA: Diagnosis not present

## 2019-05-10 DIAGNOSIS — M5136 Other intervertebral disc degeneration, lumbar region: Secondary | ICD-10-CM | POA: Diagnosis not present

## 2019-05-10 DIAGNOSIS — M461 Sacroiliitis, not elsewhere classified: Secondary | ICD-10-CM | POA: Diagnosis not present

## 2019-05-10 DIAGNOSIS — M9902 Segmental and somatic dysfunction of thoracic region: Secondary | ICD-10-CM | POA: Diagnosis not present

## 2019-05-14 DIAGNOSIS — M9902 Segmental and somatic dysfunction of thoracic region: Secondary | ICD-10-CM | POA: Diagnosis not present

## 2019-05-14 DIAGNOSIS — M461 Sacroiliitis, not elsewhere classified: Secondary | ICD-10-CM | POA: Diagnosis not present

## 2019-05-14 DIAGNOSIS — M5136 Other intervertebral disc degeneration, lumbar region: Secondary | ICD-10-CM | POA: Diagnosis not present

## 2019-05-14 DIAGNOSIS — M9903 Segmental and somatic dysfunction of lumbar region: Secondary | ICD-10-CM | POA: Diagnosis not present

## 2019-05-21 DIAGNOSIS — M461 Sacroiliitis, not elsewhere classified: Secondary | ICD-10-CM | POA: Diagnosis not present

## 2019-05-21 DIAGNOSIS — M5136 Other intervertebral disc degeneration, lumbar region: Secondary | ICD-10-CM | POA: Diagnosis not present

## 2019-05-21 DIAGNOSIS — M9902 Segmental and somatic dysfunction of thoracic region: Secondary | ICD-10-CM | POA: Diagnosis not present

## 2019-05-21 DIAGNOSIS — M9903 Segmental and somatic dysfunction of lumbar region: Secondary | ICD-10-CM | POA: Diagnosis not present

## 2019-05-28 DIAGNOSIS — M9903 Segmental and somatic dysfunction of lumbar region: Secondary | ICD-10-CM | POA: Diagnosis not present

## 2019-05-28 DIAGNOSIS — M9902 Segmental and somatic dysfunction of thoracic region: Secondary | ICD-10-CM | POA: Diagnosis not present

## 2019-05-28 DIAGNOSIS — M5136 Other intervertebral disc degeneration, lumbar region: Secondary | ICD-10-CM | POA: Diagnosis not present

## 2019-05-28 DIAGNOSIS — M461 Sacroiliitis, not elsewhere classified: Secondary | ICD-10-CM | POA: Diagnosis not present

## 2019-06-11 DIAGNOSIS — M9903 Segmental and somatic dysfunction of lumbar region: Secondary | ICD-10-CM | POA: Diagnosis not present

## 2019-06-11 DIAGNOSIS — M5136 Other intervertebral disc degeneration, lumbar region: Secondary | ICD-10-CM | POA: Diagnosis not present

## 2019-06-11 DIAGNOSIS — M461 Sacroiliitis, not elsewhere classified: Secondary | ICD-10-CM | POA: Diagnosis not present

## 2019-06-11 DIAGNOSIS — M9902 Segmental and somatic dysfunction of thoracic region: Secondary | ICD-10-CM | POA: Diagnosis not present

## 2019-07-09 DIAGNOSIS — M461 Sacroiliitis, not elsewhere classified: Secondary | ICD-10-CM | POA: Diagnosis not present

## 2019-07-09 DIAGNOSIS — M9903 Segmental and somatic dysfunction of lumbar region: Secondary | ICD-10-CM | POA: Diagnosis not present

## 2019-07-09 DIAGNOSIS — M5136 Other intervertebral disc degeneration, lumbar region: Secondary | ICD-10-CM | POA: Diagnosis not present

## 2019-07-09 DIAGNOSIS — M9902 Segmental and somatic dysfunction of thoracic region: Secondary | ICD-10-CM | POA: Diagnosis not present

## 2019-10-14 ENCOUNTER — Telehealth: Payer: Self-pay | Admitting: Family Medicine

## 2019-10-14 NOTE — Telephone Encounter (Signed)
Left message for patient to call back and schedule Medicare Annual Wellness Visit (AWV) either virtually/audio only or in office. Whichever the patients preference is.  No history of AWV; please schedule at anytime with [facility] Health Advisor.  This should be a 40 minute visit

## 2019-10-23 ENCOUNTER — Ambulatory Visit (INDEPENDENT_AMBULATORY_CARE_PROVIDER_SITE_OTHER): Payer: Medicare Other

## 2019-10-23 ENCOUNTER — Other Ambulatory Visit: Payer: Self-pay

## 2019-10-23 VITALS — BP 142/82 | HR 61 | Resp 16 | Ht 69.0 in | Wt 174.0 lb

## 2019-10-23 DIAGNOSIS — Z0000 Encounter for general adult medical examination without abnormal findings: Secondary | ICD-10-CM

## 2019-10-23 NOTE — Progress Notes (Signed)
Subjective:   Clayton [name] is a 85 y.o. male who presents for an Initial Medicare Annual Wellness Visit.  Review of Systems     Cardiac Risk Factors include: advanced age (>63men, >66 women);male gender     Objective:    Today's Vitals   10/23/19 0816  BP: (!) 142/82  Pulse: 61  Resp: 16  SpO2: 97%  Weight: 174 lb (78.9 kg)  Height: 5\' 9"  (1.753 m)   Body mass index is 25.7 kg/m.  Advanced Directives 10/23/2019 01/08/2019 08/30/2018 08/30/2018 07/03/2017 05/22/2017 09/14/2016  Does Patient Have a Medical Advance Directive? No No No No No No No  Type of Advance Directive - - - - - - -  Would patient like information on creating a medical advance directive? Yes (MAU/Ambulatory/Procedural Areas - Information given) No - Patient declined No - Patient declined No - Patient declined No - Patient declined No - Patient declined -    Current Medications (verified) Outpatient Encounter Medications as of 10/23/2019  Medication Sig  . acetaminophen (TYLENOL) 325 MG tablet Take 650 mg by mouth every 6 (six) hours as needed.  . [DISCONTINUED] cyclobenzaprine (FLEXERIL) 5 MG tablet Take 1 tablet (5 mg total) by mouth 2 (two) times daily as needed for muscle spasms.  . [DISCONTINUED] etodolac (LODINE) 200 MG capsule Take 1 capsule (200 mg total) by mouth 2 (two) times daily. (Patient not taking: Reported on 10/23/2019)  . [DISCONTINUED] Multiple Vitamin (MULTIVITAMIN) capsule Take 1 capsule by mouth daily. (Patient not taking: Reported on 10/23/2019)   No facility-administered encounter medications on file as of 10/23/2019.    Allergies (verified) Aspirin and Sulfa antibiotics   History: Past Medical History:  Diagnosis Date  . Basal cell carcinoma   . CKD (chronic kidney disease)   . DVT (deep venous thrombosis) ([place])   . HTN (hypertension)    Past Surgical History:  Procedure Laterality Date  . COLONOSCOPY  2014   cleared for 5 yrs  . COLONOSCOPY N/A 09/01/2018   Procedure:  COLONOSCOPY;  Surgeon: Virgel Manifold, MD;  Location: [facility] ENDOSCOPY;  Service: Endoscopy;  Laterality: N/A;  . CRANIECTOMY FOR DEPRESSED SKULL FRACTURE    . ESOPHAGOGASTRODUODENOSCOPY N/A 09/01/2018   Procedure: ESOPHAGOGASTRODUODENOSCOPY (EGD);  Surgeon: Virgel Manifold, MD;  Location: [facility] ENDOSCOPY;  Service: Endoscopy;  Laterality: N/A;  . IVC FILTER REMOVAL N/A 07/03/2017   Procedure: IVC FILTER REMOVAL;  Surgeon: Algernon Huxley, MD;  Location: [place] CV LAB;  Service: Cardiovascular;  Laterality: N/A;  . PERIPHERAL VASCULAR THROMBECTOMY Right 05/22/2017   Procedure: PERIPHERAL VASCULAR THROMBECTOMY;  Surgeon: Algernon Huxley, MD;  Location: [place] CV LAB;  Service: Cardiovascular;  Laterality: Right;   Family History  Problem Relation Age of Onset  . Heart disease Father    Social History   Socioeconomic History  . Marital status: Married    Spouse name: Not on file  . Number of children: 3  . Years of education: Not on file  . Highest education level: Not on file  Occupational History  . Not on file  Tobacco Use  . Smoking status: Former Smoker    Quit date: 01/07/1949    Years since quitting: 70.8  . Smokeless tobacco: Never Used  Vaping Use  . Vaping Use: Never used  Substance and Sexual Activity  . Alcohol use: Yes    Alcohol/week: 1.0 standard drink    Types: 1 Glasses of wine per week    Comment: once a week  .  Drug use: No  . Sexual activity: Not Currently  Other Topics Concern  . 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 Difficulty of Paying Living Expenses: Not hard at all  Food Insecurity: No Food Insecurity  . Worried About Charity fundraiser in the Last Year: Never true  . Ran Out of Food in the Last Year: Never true  Transportation Needs: No Transportation Needs  . Lack of Transportation (Medical): No  . Lack of  Transportation (Non-Medical): No  Physical Activity: Inactive  . Days of Exercise per Week: 0 days  . Minutes of Exercise per Session: 0 min  Stress: No Stress Concern Present  . Feeling of Stress : Not at all  Social Connections: Moderately Integrated  . Frequency of Communication with Friends and Family: More than three times a week  . Frequency of Social Gatherings with Friends and Family: Once a week  . Attends Religious Services: Never  . Active Member of Clubs or Organizations: Yes  . Attends Archivist Meetings: More than 4 times per year  . Marital Status: Married    Tobacco Counseling Counseling given: Not Answered   Clinical Intake:  Pre-visit preparation completed: Yes  Pain : No/denies pain     BMI - recorded: 25.7 Nutritional Status: BMI 25 -29 Overweight Nutritional Risks: None Diabetes: No  How often do you need to have someone help you when you read instructions, pamphlets, or other written materials from your doctor or pharmacy?: 1 - Never   Interpreter Needed?: No  Information entered by :: Clemetine Marker LPN   Activities of Daily Living In your present state of health, do you have any difficulty performing the following activities: 10/23/2019  Hearing? Y  Comment no hearing aids at this time  Vision? N  Difficulty concentrating or making decisions? Y  Walking or climbing stairs? N  Dressing or bathing? N  Doing errands, shopping? N  Preparing Food and eating ? N  Using the Toilet? N  In the past six months, have you accidently leaked urine? N  Do you have problems with loss of bowel control? N  Managing your Medications? N  Managing your Finances? N  Housekeeping or managing your Housekeeping? N  Some recent data might be hidden    Patient Care Team: Juline Patch, MD as PCP - General (Family Medicine)  Indicate any recent Medical Services you may have received from other than [facility] providers in the past year (date may be  approximate).     Assessment:   This is a routine wellness examination for [employer].  Hearing/Vision screen  Hearing Screening   125Hz  250Hz  500Hz  1000Hz  2000Hz  3000Hz  4000Hz  6000Hz  8000Hz   Right ear:           Left ear:           Comments: Pt c/o mild hearing loss from hearing eval 5 years ago. Interested in hearing aids. Info provided on local hearing clinics   Vision Screening Comments: Past due for eye exam. Established with provider in [place].   Dietary issues and exercise activities discussed: Current Exercise Habits: The patient does not participate in regular exercise at present, Exercise limited by: None identified  Goals   None    Depression Screen [facility] 2/9 Scores 10/23/2019 02/20/2019 09/07/2018 05/16/2017 04/10/2015  PHQ - 2 Score 0  0 0 0 0  PHQ- 9 Score 2 - 1 0 -    Fall Risk Fall Risk  10/23/2019 02/20/2019 05/16/2017 04/10/2015  Falls in the past year? 1 1 Yes No  Number falls in past yr: 1 1 1  -  Injury with Fall? 1 1 Yes -  Risk for fall due to : History of fall(s);Impaired balance/gait - - -  Follow up Falls prevention discussed - Falls evaluation completed -    Any stairs in or around the home? Yes  If so, are there any without handrails? Yes  Home free of loose throw rugs in walkways, pet beds, electrical cords, etc? Yes  Adequate lighting in your home to reduce risk of falls? Yes   ASSISTIVE DEVICES UTILIZED TO PREVENT FALLS:  Life alert? No  Use of a cane, walker or w/c? No  Grab bars in the bathroom? No  Shower chair or bench in shower? Yes Elevated toilet seat or a handicapped toilet? No   TIMED UP AND GO:  Was the test performed? Yes Length of time to ambulate 10 feet: 5 sec.   Gait steady and fast without use of assistive device  Cognitive Function:     6CIT Screen 10/23/2019  What Year? 0 points  What month? 0 points  What time? 0 points  Count back from 20 0 points  Months in reverse 4 points  Repeat phrase 8 points  Total  Score 12    Immunizations Immunization History  Administered Date(s) Administered  . PFIZER SARS-COV-2 Vaccination 05/09/2019, 06/01/2019  . Tdap 09/14/2016    TDAP status: Up to date   Flu Vaccine status: Declined, Education has been provided regarding the importance of this vaccine but patient still declined. Advised may receive this vaccine at local pharmacy or Health Dept. Aware to provide a copy of the vaccination record if obtained from local pharmacy or Health Dept. Verbalized acceptance and understanding.   Pneumococcal vaccine status: Declined,  Education has been provided regarding the importance of this vaccine but patient still declined. Advised may receive this vaccine at local pharmacy or Health Dept. Aware to provide a copy of the vaccination record if obtained from local pharmacy or Health Dept. Verbalized acceptance and understanding.    Covid-19 vaccine status: Completed vaccines  Qualifies for Shingles Vaccine? Yes   Zostavax completed No   Shingrix Completed?: No.    Education has been provided regarding the importance of this vaccine. Patient has been advised to call insurance company to determine out of pocket expense if they have not yet received this vaccine. Advised may also receive vaccine at local pharmacy or Health Dept. Verbalized acceptance and understanding.  Screening Tests Health Maintenance  Topic Date Due  . PNA vac Low Risk Adult (2 of 2 - PPSV23) 12/14/2016  . INFLUENZA VACCINE  12/01/2019  . TETANUS/TDAP  09/15/2026  . COVID-19 Vaccine  Completed    Health Maintenance  Health Maintenance Due  Topic Date Due  . PNA vac Low Risk Adult (2 of 2 - PPSV23) 12/14/2016    Colonoscopy completed 09/01/18. No longer required.   Lung Cancer Screening: (Low Dose CT Chest recommended if Age 8-80 years, 30 pack-year currently smoking OR have quit w/in 15years.) does not qualify.   Additional Screening:  Hepatitis C Screening: no longer  required  Vision Screening: Recommended annual ophthalmology exams for early detection of glaucoma and other disorders of the eye. Is the patient up to date with their annual eye exam?  No  Who is the provider or what is the name of the office in which the patient attends annual eye exams? [facility]  Dental Screening: Recommended annual dental exams for proper oral hygiene  Community Resource Referral / Chronic Care Management: CRR required this visit?  No   CCM required this visit?  No      Plan:     I have personally reviewed and noted the following in the patient's chart:   . Medical and social history . Use of alcohol, tobacco or illicit drugs  . Current medications and supplements . Functional ability and status . Nutritional status . Physical activity . Advanced directives . List of other physicians . Hospitalizations, surgeries, and ER visits in previous 12 months . Vitals . Screenings to include cognitive, depression, and falls . Referrals and appointments  In addition, I have reviewed and discussed with patient certain preventive protocols, quality metrics, and best practice recommendations. A written personalized care plan for preventive services as well as general preventive health recommendations were provided to patient.     Clemetine Marker, LPN   [id number]   Nurse Notes: Pt brought in list of topics to discuss per his wife's written note. Changes/issues include:  decreased sense of smell, taste, hearing and loss of appetite which has been ongoing for the past 2-3 years; hx of falls with injury over past 18 months but no recent falls; change in short-term memory since last year; low physical and emotional energy since last year.   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 Pt advised to discuss concerns at office visit and front desk scheduled follow up with Dr. Ronnald Ramp 11/25/19 for CPE and labs.

## 2019-10-23 NOTE — Patient Instructions (Addendum)
[name] , Thank you for taking time to come for your Medicare Wellness Visit. I appreciate your ongoing commitment to your health goals. Please review the following plan we discussed and let me know if I can assist you in the future.   Screening recommendations/referrals: Colonoscopy: done 09/01/18 Recommended yearly ophthalmology/optometry visit for glaucoma screening and checkup Recommended yearly dental visit for hygiene and checkup  Vaccinations: Influenza vaccine: postponed Pneumococcal vaccine: postponed Tdap vaccine: done 09/14/16 Shingles vaccine: Shingrix discussed. Please contact your pharmacy for coverage information.  Covid-19: done 05/09/19 & 06/01/19  Advanced directives: Advance directive discussed with you today. I have provided a copy for you to complete at home and have notarized. Once this is complete please bring a copy in to our office so we can scan it into your chart.  Conditions/risks identified:   Free hearing clinics offered in [place]:   [facility] [place], [place], [place] [address] [phone]  [facility], [place],  [id number] [phone]   Next appointment: Follow up in one year for your annual wellness visit.   Preventive Care 105 Years and Older, Male Preventive care refers to lifestyle choices and visits with your health care provider that can promote health and wellness. What does preventive care include?  A yearly physical exam. This is also called an annual well check.  Dental exams once or twice a year.  Routine eye exams. Ask your health care provider how often you should have your eyes checked.  Personal lifestyle choices, including:  Daily care of your teeth and gums.  Regular physical activity.  Eating a healthy diet.  Avoiding tobacco and drug use.  Limiting alcohol use.  Practicing safe sex.  Taking low doses of aspirin every day.  Taking vitamin and  mineral supplements as recommended by your health care provider. What happens during an annual well check? The services and screenings done by your health care provider during your annual well check will depend on your age, overall health, lifestyle risk factors, and family history of disease. Counseling  Your health care provider may ask you questions about your:  Alcohol use.  Tobacco use.  Drug use.  Emotional well-being.  Home and relationship well-being.  Sexual activity.  Eating habits.  History of falls.  Memory and ability to understand (cognition).  Work and work Statistician. Screening  You may have the following tests or measurements:  Height, weight, and BMI.  Blood pressure.  Lipid and cholesterol levels. These may be checked every 5 years, or more frequently if you are over 85 years old.  Skin check.  Lung cancer screening. You may have this screening every year starting at age 15 if you have a 30-pack-year history of smoking and currently smoke or have quit within the past 15 years.  Fecal occult blood test (FOBT) of the stool. You may have this test every year starting at age 40.  Flexible sigmoidoscopy or colonoscopy. You may have a sigmoidoscopy every 5 years or a colonoscopy every 10 years starting at age 48.  Prostate cancer screening. Recommendations will vary depending on your family history and other risks.  Hepatitis C blood test.  Hepatitis B blood test.  Sexually transmitted disease (STD) testing.  Diabetes screening. This is done by checking your blood sugar (glucose) after you have not eaten for a while (fasting). You may have this done every 1-3 years.  Abdominal aortic aneurysm (AAA) screening. You may need  this if you are a current or former smoker.  Osteoporosis. You may be screened starting at age 58 if you are at high risk. Talk with your health care provider about your test results, treatment options, and if necessary, the need  for more tests. Vaccines  Your health care provider may recommend certain vaccines, such as:  Influenza vaccine. This is recommended every year.  Tetanus, diphtheria, and acellular pertussis (Tdap, Td) vaccine. You may need a Td booster every 10 years.  Zoster vaccine. You may need this after age 17.  Pneumococcal 13-valent conjugate (PCV13) vaccine. One dose is recommended after age 33.  Pneumococcal polysaccharide (PPSV23) vaccine. One dose is recommended after age 24. Talk to your health care provider about which screenings and vaccines you need and how often you need them. This information is not intended to replace advice given to you by your health care provider. Make sure you discuss any questions you have with your health care provider. Document Released: 05/15/2015 Document Revised: 01/06/2016 Document Reviewed: 02/17/2015 Elsevier Interactive Patient Education  2017 [place] Prevention in the Home Falls can cause injuries. They can happen to people of all ages. There are many things you can do to make your home safe and to help prevent falls. What can I do on the outside of my home?  Regularly fix the edges of walkways and driveways and fix any cracks.  Remove anything that might make you trip as you walk through a door, such as a raised step or threshold.  Trim any bushes or trees on the path to your home.  Use bright outdoor lighting.  Clear any walking paths of anything that might make someone trip, such as rocks or tools.  Regularly check to see if handrails are loose or broken. Make sure that both sides of any steps have handrails.  Any raised decks and porches should have guardrails on the edges.  Have any leaves, snow, or ice cleared regularly.  Use sand or salt on walking paths during winter.  Clean up any spills in your garage right away. This includes oil or grease spills. What can I do in the bathroom?  Use night lights.  Install grab bars  by the toilet and in the tub and shower. Do not use towel bars as grab bars.  Use non-skid mats or decals in the tub or shower.  If you need to sit down in the shower, use a plastic, non-slip stool.  Keep the floor dry. Clean up any water that spills on the floor as soon as it happens.  Remove soap buildup in the tub or shower regularly.  Attach bath mats securely with double-sided non-slip rug tape.  Do not have throw rugs and other things on the floor that can make you trip. What can I do in the bedroom?  Use night lights.  Make sure that you have a light by your bed that is easy to reach.  Do not use any sheets or blankets that are too big for your bed. They should not hang down onto the floor.  Have a firm chair that has side arms. You can use this for support while you get dressed.  Do not have throw rugs and other things on the floor that can make you trip. What can I do in the kitchen?  Clean up any spills right away.  Avoid walking on wet floors.  Keep items that you use a lot in easy-to-reach places.  If you  need to reach something above you, use a strong step stool that has a grab bar.  Keep electrical cords out of the way.  Do not use floor polish or wax that makes floors slippery. If you must use wax, use non-skid floor wax.  Do not have throw rugs and other things on the floor that can make you trip. What can I do with my stairs?  Do not leave any items on the stairs.  Make sure that there are handrails on both sides of the stairs and use them. Fix handrails that are broken or loose. Make sure that handrails are as long as the stairways.  Check any carpeting to make sure that it is firmly attached to the stairs. Fix any carpet that is loose or worn.  Avoid having throw rugs at the top or bottom of the stairs. If you do have throw rugs, attach them to the floor with carpet tape.  Make sure that you have a light switch at the top of the stairs and the  bottom of the stairs. If you do not have them, ask someone to add them for you. What else can I do to help prevent falls?  Wear shoes that:  Do not have high heels.  Have rubber bottoms.  Are comfortable and fit you well.  Are closed at the toe. Do not wear sandals.  If you use a stepladder:  Make sure that it is fully opened. Do not climb a closed stepladder.  Make sure that both sides of the stepladder are locked into place.  Ask someone to hold it for you, if possible.  Clearly mark and make sure that you can see:  Any grab bars or handrails.  First and last steps.  Where the edge of each step is.  Use tools that help you move around (mobility aids) if they are needed. These include:  Canes.  Walkers.  Scooters.  Crutches.  Turn on the lights when you go into a dark area. Replace any light bulbs as soon as they burn out.  Set up your furniture so you have a clear path. Avoid moving your furniture around.  If any of your floors are uneven, fix them.  If there are any pets around you, be aware of where they are.  Review your medicines with your doctor. Some medicines can make you feel dizzy. This can increase your chance of falling. Ask your doctor what other things that you can do to help prevent falls. This information is not intended to replace advice given to you by your health care provider. Make sure you discuss any questions you have with your health care provider. Document Released: 02/12/2009 Document Revised: 09/24/2015 Document Reviewed: 05/23/2014 Elsevier Interactive Patient Education  2017 [employer].

## 2019-11-25 ENCOUNTER — Encounter: Payer: Medicare Other | Admitting: Family Medicine

## 2019-11-26 ENCOUNTER — Encounter: Payer: Self-pay | Admitting: Family Medicine

## 2019-11-26 ENCOUNTER — Other Ambulatory Visit: Payer: Self-pay

## 2019-11-26 ENCOUNTER — Ambulatory Visit (INDEPENDENT_AMBULATORY_CARE_PROVIDER_SITE_OTHER): Payer: Medicare Other | Admitting: Family Medicine

## 2019-11-26 VITALS — BP 130/80 | HR 72 | Ht 69.0 in | Wt 175.0 lb

## 2019-11-26 DIAGNOSIS — E7801 Familial hypercholesterolemia: Secondary | ICD-10-CM | POA: Diagnosis not present

## 2019-11-26 DIAGNOSIS — Z0000 Encounter for general adult medical examination without abnormal findings: Secondary | ICD-10-CM | POA: Diagnosis not present

## 2019-11-26 DIAGNOSIS — R351 Nocturia: Secondary | ICD-10-CM

## 2019-11-26 NOTE — Progress Notes (Signed)
Date:  11/26/2019   Name:  AMEET SANDY   DOB:  20-Dec-1934   MRN:  [id number]   Chief Complaint: Annual Exam  Patient is a 85 year old male who presents for a comprehensive physical exam. The patient reports the following problems: short term memory/balance. Health maintenance has been reviewed up to date.   Lab Results  Component Value Date   CREATININE 1.05 09/05/2018   BUN 12 09/05/2018   NA 141 09/05/2018   K 3.5 09/05/2018   CL 111 09/05/2018   CO2 22 09/05/2018   Lab Results  Component Value Date   CHOL 192 12/16/2015   HDL 55 12/16/2015   LDLCALC 117 (H) 12/16/2015   TRIG 102 12/16/2015   CHOLHDL 3.5 12/16/2015   No results found for: TSH No results found for: HGBA1C Lab Results  Component Value Date   WBC 9.6 09/07/2018   HGB 10.8 (L) 09/07/2018   HCT 31.9 (L) 09/07/2018   MCV 101.6 (H) 09/07/2018   PLT 418 (H) 09/07/2018   Lab Results  Component Value Date   ALT 18 08/30/2018   AST 21 08/30/2018   ALKPHOS 48 08/30/2018   BILITOT 0.6 08/30/2018     Review of Systems  Constitutional: Negative for chills and fever.  HENT: Negative for drooling, ear discharge, ear pain and sore throat.   Respiratory: Negative for cough, shortness of breath and wheezing.   Cardiovascular: Negative for chest pain, palpitations and leg swelling.  Gastrointestinal: Negative for abdominal pain, blood in stool, constipation, diarrhea and nausea.  Endocrine: Negative for polydipsia.  Genitourinary: Negative for dysuria, frequency, hematuria and urgency.  Musculoskeletal: Negative for back pain, myalgias and neck pain.  Skin: Negative for rash.  Allergic/Immunologic: Negative for environmental allergies.  Neurological: Negative for dizziness and headaches.  Hematological: Does not bruise/bleed easily.  Psychiatric/Behavioral: Negative for suicidal ideas. The patient is not nervous/anxious.     Patient Active Problem List   Diagnosis Date Noted  . Symptomatic  anemia 09/03/2018  . GI bleed 09/02/2018  . Diverticulosis of colon with hemorrhage   . Acute gastrointestinal hemorrhage   . Hiatal hernia   . Stomach irritation   . Diverticulosis of small intestine without hemorrhage   . HTN (hypertension) 08/30/2018  . CKD (chronic kidney disease), stage III 08/30/2018  . Blood in stool 08/30/2018  . Deep vein thrombosis (DVT) of proximal vein of right lower extremity ([place]) 06/14/2017    Allergies  Allergen Reactions  . Aspirin Rash  . Sulfa Antibiotics Rash    Past Surgical History:  Procedure Laterality Date  . COLONOSCOPY  2014   cleared for 5 yrs  . COLONOSCOPY N/A 09/01/2018   Procedure: COLONOSCOPY;  Surgeon: Virgel Manifold, MD;  Location: [facility] ENDOSCOPY;  Service: Endoscopy;  Laterality: N/A;  . CRANIECTOMY FOR DEPRESSED SKULL FRACTURE    . ESOPHAGOGASTRODUODENOSCOPY N/A 09/01/2018   Procedure: ESOPHAGOGASTRODUODENOSCOPY (EGD);  Surgeon: Virgel Manifold, MD;  Location: [facility] ENDOSCOPY;  Service: Endoscopy;  Laterality: N/A;  . IVC FILTER REMOVAL N/A 07/03/2017   Procedure: IVC FILTER REMOVAL;  Surgeon: Algernon Huxley, MD;  Location: [place] CV LAB;  Service: Cardiovascular;  Laterality: N/A;  . PERIPHERAL VASCULAR THROMBECTOMY Right 05/22/2017   Procedure: PERIPHERAL VASCULAR THROMBECTOMY;  Surgeon: Algernon Huxley, MD;  Location: [place] [facility] LAB;  Service: Cardiovascular;  Laterality: Right;    Social History   Tobacco Use  . Smoking status: Former Smoker    Quit date: 01/07/1949  Years since quitting: 70.9  . Smokeless tobacco: Never Used  Vaping Use  . Vaping Use: Never used  Substance Use Topics  . Alcohol use: Yes    Alcohol/week: 1.0 standard drink    Types: 1 Glasses of wine per week    Comment: once a week  . Drug use: No     Medication list has been reviewed and updated.  Current Meds  Medication Sig  . acetaminophen (TYLENOL) 325 MG tablet Take 650 mg by mouth every 6 (six) hours as needed.     PHQ 2/9 Scores 11/26/2019 10/23/2019 02/20/2019 09/07/2018  PHQ - 2 Score 0 0 0 0  PHQ- 9 Score 1 2 - 1    GAD 7 : Generalized Anxiety Score 11/26/2019  Nervous, Anxious, on Edge 0  Control/stop worrying 0  Worry too much - different things 0  Trouble relaxing 0  Restless 0  Easily annoyed or irritable 0  Afraid - awful might happen 0  Total GAD 7 Score 0    BP Readings from Last 3 Encounters:  11/26/19 (!) 130/80  10/23/19 (!) 142/82  02/20/19 124/77    Physical Exam Vitals and nursing note reviewed.  Constitutional:      Appearance: Normal appearance. He is well-developed, well-groomed and normal weight.  HENT:     Head: Normocephalic.     Jaw: There is normal jaw occlusion.     Right Ear: Hearing, tympanic membrane, ear canal and external ear normal.     Left Ear: Hearing, tympanic membrane, ear canal and external ear normal.     Nose: Nose normal. No congestion or rhinorrhea.     Mouth/Throat:     Lips: Pink.     Mouth: Mucous membranes are moist.     Pharynx: Oropharynx is clear. Uvula midline. No oropharyngeal exudate.     Tonsils: No tonsillar exudate or tonsillar abscesses.  Eyes:     General: Lids are normal. Vision grossly intact. Gaze aligned appropriately. No scleral icterus.       Right eye: No discharge.        Left eye: No discharge.     Extraocular Movements: Extraocular movements intact.     Right eye: Normal extraocular motion.     Conjunctiva/sclera: Conjunctivae normal.     Pupils: Pupils are equal, round, and reactive to light.     Funduscopic exam:    Right eye: Red reflex present.        Left eye: Red reflex present. Neck:     Thyroid: No thyroid mass, thyromegaly or thyroid tenderness.     Vascular: Normal carotid pulses. No carotid bruit, hepatojugular reflux or JVD.     Trachea: Trachea and phonation normal. No tracheal deviation.  Cardiovascular:     Rate and Rhythm: Normal rate and regular rhythm.     Chest Wall: PMI is not displaced.      Pulses: Normal pulses.          Carotid pulses are 2+ on the right side and 2+ on the left side.      Radial pulses are 2+ on the right side and 2+ on the left side.       Femoral pulses are 2+ on the right side and 2+ on the left side.      Popliteal pulses are 2+ on the right side and 2+ on the left side.       Dorsalis pedis pulses are 2+ on the right side and 2+ on the  left side.       Posterior tibial pulses are 2+ on the right side and 2+ on the left side.     Heart sounds: Normal heart sounds, S1 normal and S2 normal. Heart sounds not distant. No murmur heard.  No systolic murmur is present.  No diastolic murmur is present.  No friction rub. No gallop. No S3 or S4 sounds.   Pulmonary:     Effort: Pulmonary effort is normal. No respiratory distress.     Breath sounds: Normal breath sounds and air entry. No decreased breath sounds, wheezing, rhonchi or rales.  Chest:     Breasts:        Right: Normal.        Left: Normal.  Abdominal:     General: Bowel sounds are normal.     Palpations: Abdomen is soft. There is no hepatomegaly, splenomegaly or mass.     Tenderness: There is no abdominal tenderness. There is no right CVA tenderness, left CVA tenderness, guarding or rebound. Negative signs include Murphy's sign, Rovsing's sign, McBurney's sign, psoas sign and obturator sign.     Hernia: A hernia is present. There is no hernia in the umbilical area, ventral area, left inguinal area or right inguinal area.  Genitourinary:    Penis: Normal and uncircumcised.      Testes: Normal.        Right: Mass, tenderness, swelling or varicocele not present.        Left: Mass, tenderness, swelling or varicocele not present.     Epididymis:     Right: Normal.     Left: Normal.  Musculoskeletal:        General: No tenderness. Normal range of motion.     Cervical back: Normal, full passive range of motion without pain, normal range of motion and neck supple.     Thoracic back: Normal.      Lumbar back: Normal.     Right lower leg: No edema.     Left lower leg: No edema.  Feet:     Right foot:     Skin integrity: Skin integrity normal.     Left foot:     Skin integrity: Skin integrity normal.  Lymphadenopathy:     Cervical: No cervical adenopathy.     Right cervical: No superficial, deep or posterior cervical adenopathy.    Left cervical: No superficial, deep or posterior cervical adenopathy.     Upper Body:     Right upper body: No supraclavicular or axillary adenopathy.     Left upper body: No supraclavicular or axillary adenopathy.     Lower Body: No right inguinal adenopathy. No left inguinal adenopathy.  Skin:    General: Skin is cool.     Capillary Refill: Capillary refill takes less than 2 seconds.     Findings: No rash.  Neurological:     General: No focal deficit present.     Mental Status: He is alert and oriented to person, place, and time.     Cranial Nerves: Cranial nerves are intact. No cranial nerve deficit.     Sensory: Sensation is intact.     Motor: Motor function is intact.     Deep Tendon Reflexes: Reflexes are normal and symmetric.  Psychiatric:        Attention and Perception: Attention normal.        Mood and Affect: Mood normal.        Speech: Speech normal.  Behavior: Behavior normal. Behavior is cooperative.        Thought Content: Thought content normal.        Cognition and Memory: Cognition normal.     Wt Readings from Last 3 Encounters:  11/26/19 175 lb (79.4 kg)  10/23/19 174 lb (78.9 kg)  02/20/19 171 lb 14.4 oz (78 kg)    BP (!) 130/80   Pulse 72   Ht 5\' 9"  (1.753 m)   Wt 175 lb (79.4 kg)   BMI 25.84 kg/m   Assessment and Plan:  1. Annual physical exam No subjective/objective concerns noted during history and physical exam except for some balance issues and some mild short-term memory concerns.  Patient is chart was reviewed for previous encounters, most recent labs, most recent imaging, and care  everywhere.KOBEY SIDES is a 85 y.o. male who presents today for his Complete Annual Exam. He feels well. He reports exercising . He reports he is sleeping well. Immunizations are reviewed and recommendations provided.   Age appropriate screening tests are discussed. Counseling given for risk factor reduction interventions.  We will obtain a CMP, CBC, and lipid panel. - Comprehensive metabolic panel - CBC with Differential/Platelet - Lipid Panel With LDL/HDL Ratio  2. Familial hypercholesterolemia Chronic.  Controlled with diet.  Uncomplicated.  We will obtain a lipid panel to determine current status of lipid concerns. - Lipid Panel With LDL/HDL Ratio  3. Nocturia On occasion.  Will check PSA.  DRE was noted to be normal. - PSA

## 2019-11-26 NOTE — Patient Instructions (Signed)

## 2019-11-27 LAB — CBC WITH DIFFERENTIAL/PLATELET
Basophils Absolute: 0.1 10*3/uL (ref 0.0–0.2)
Basos: 1 %
EOS (ABSOLUTE): 0.1 10*3/uL (ref 0.0–0.4)
Eos: 1 %
Hematocrit: 43.9 % (ref 37.5–51.0)
Hemoglobin: 14.8 g/dL (ref 13.0–17.7)
Immature Grans (Abs): 0 10*3/uL (ref 0.0–0.1)
Immature Granulocytes: 0 %
Lymphocytes Absolute: 1.6 10*3/uL (ref 0.7–3.1)
Lymphs: 22 %
MCH: 32.7 pg (ref 26.6–33.0)
MCHC: 33.7 g/dL (ref 31.5–35.7)
MCV: 97 fL (ref 79–97)
Monocytes Absolute: 0.8 10*3/uL (ref 0.1–0.9)
Monocytes: 10 %
Neutrophils Absolute: 5 10*3/uL (ref 1.4–7.0)
Neutrophils: 66 %
Platelets: 313 10*3/uL (ref 150–450)
RBC: 4.52 x10E6/uL (ref 4.14–5.80)
RDW: 12.1 % (ref 11.6–15.4)
WBC: 7.4 10*3/uL (ref 3.4–10.8)

## 2019-11-27 LAB — COMPREHENSIVE METABOLIC PANEL
ALT: 18 IU/L (ref 0–44)
AST: 23 IU/L (ref 0–40)
Albumin/Globulin Ratio: 1.6 (ref 1.2–2.2)
Albumin: 4.4 g/dL (ref 3.6–4.6)
Alkaline Phosphatase: 62 IU/L (ref 48–121)
BUN/Creatinine Ratio: 12 (ref 10–24)
BUN: 13 mg/dL (ref 8–27)
Bilirubin Total: 0.6 mg/dL (ref 0.0–1.2)
CO2: 22 mmol/L (ref 20–29)
Calcium: 9 mg/dL (ref 8.6–10.2)
Chloride: 104 mmol/L (ref 96–106)
Creatinine, Ser: 1.1 mg/dL (ref 0.76–1.27)
GFR calc Af Amer: 72 mL/min/{1.73_m2} (ref >59)
GFR calc non Af Amer: 62 mL/min/{1.73_m2} (ref >59)
Globulin, Total: 2.8 g/dL (ref 1.5–4.5)
Glucose: 87 mg/dL (ref 65–99)
Potassium: 4.5 mmol/L (ref 3.5–5.2)
Sodium: 140 mmol/L (ref 134–144)
Total Protein: 7.2 g/dL (ref 6.0–8.5)

## 2019-11-27 LAB — LIPID PANEL WITH LDL/HDL RATIO
Cholesterol, Total: 205 mg/dL — ABNORMAL HIGH (ref 100–199)
HDL: 46 mg/dL (ref >39)
LDL Chol Calc (NIH): 140 mg/dL — ABNORMAL HIGH (ref 0–99)
LDL/HDL Ratio: 3 ratio (ref 0.0–3.6)
Triglycerides: 105 mg/dL (ref 0–149)
VLDL Cholesterol Cal: 19 mg/dL (ref 5–40)

## 2019-11-27 LAB — PSA: Prostate Specific Ag, Serum: 1.3 ng/mL (ref 0.0–4.0)

## 2019-11-29 IMAGING — NM NUCLEAR MEDICINE GASTROINTESTINAL BLEEDING STUDY
2 series · 12 of 12 positions shown · non-contrast
Comparison: 04/30/2007 CT abdomen/pelvis.
COMPARISON: 04/30/2007 CT abdomen/pelvis.

Addendum:
CLINICAL DATA: Inpatient. GI bleed with bright red and dark blood
per rectum.

EXAM:
NUCLEAR MEDICINE GASTROINTESTINAL BLEEDING SCAN
TECHNIQUE: Sequential abdominal images were obtained following intravenous
administration of Tc-RRm labeled red blood cells.
RADIOPHARMACEUTICALS:  21.0 mCi Tc-RRm pertechnetate in-vitro
labeled red cells.

[Series 1000: gi bleed hour 2 · 4.80mm/px · 6 of 60 frames shown]
[frame 6/60]
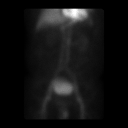
[frame 16/60]
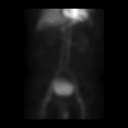
[frame 26/60]
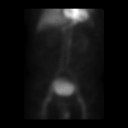
[frame 36/60]
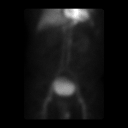
[frame 46/60]
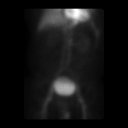
[frame 56/60]
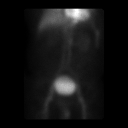

[Series 1000: hour 1 gi bleed · 4.80mm/px · 6 of 60 frames shown]
[frame 6/60]
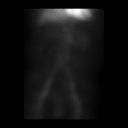
[frame 16/60]
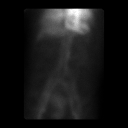
[frame 26/60]
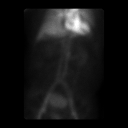
[frame 36/60]
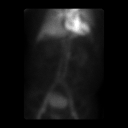
[frame 46/60]
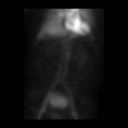
[frame 56/60]
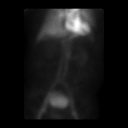

[12 of 12 positions shown; findings below may reference images not displayed]

FINDINGS: During the second hour of imaging, there is curvilinear radiotracer
activity in the left upper quadrant of the abdomen, which appears to
demonstrate transit in the expected configuration of splenic flexure
of the colon. No additional sites of active GI bleed.
IMPRESSION: Findings are compatible with active GI bleed in the left upper
quadrant of the abdomen, morphologically correlating with the
splenic flexure of the colon. Marked diverticulosis was seen in the
left colon on remote 4448 CT abdomen study.

Critical Value/emergent results were called by telephone at the time
of interpretation on 09/02/2018 at [DATE] to RN HAU SALVI,
who verbally acknowledged these results. We are also trying to
contact the ordering provider.

ADDENDUM:
Critical Value/emergent results were called by telephone at the time
of interpretation on 09/02/2018 at [DATE] to Dr. VLESHA, who
verbally acknowledged these results.

*** End of Addendum ***
FINDINGS: During the second hour of imaging, there is curvilinear radiotracer
activity in the left upper quadrant of the abdomen, which appears to
demonstrate transit in the expected configuration of splenic flexure
of the colon. No additional sites of active GI bleed.
IMPRESSION: Findings are compatible with active GI bleed in the left upper
quadrant of the abdomen, morphologically correlating with the
splenic flexure of the colon. Marked diverticulosis was seen in the
left colon on remote 4448 CT abdomen study.

Critical Value/emergent results were called by telephone at the time
of interpretation on 09/02/2018 at [DATE] to RN HAU SALVI,
who verbally acknowledged these results. We are also trying to
contact the ordering provider.

## 2019-12-12 DIAGNOSIS — M9903 Segmental and somatic dysfunction of lumbar region: Secondary | ICD-10-CM | POA: Diagnosis not present

## 2019-12-12 DIAGNOSIS — M9902 Segmental and somatic dysfunction of thoracic region: Secondary | ICD-10-CM | POA: Diagnosis not present

## 2019-12-12 DIAGNOSIS — M5136 Other intervertebral disc degeneration, lumbar region: Secondary | ICD-10-CM | POA: Diagnosis not present

## 2019-12-12 DIAGNOSIS — M461 Sacroiliitis, not elsewhere classified: Secondary | ICD-10-CM | POA: Diagnosis not present

## 2019-12-18 DIAGNOSIS — M9903 Segmental and somatic dysfunction of lumbar region: Secondary | ICD-10-CM | POA: Diagnosis not present

## 2019-12-18 DIAGNOSIS — M9902 Segmental and somatic dysfunction of thoracic region: Secondary | ICD-10-CM | POA: Diagnosis not present

## 2019-12-18 DIAGNOSIS — M5136 Other intervertebral disc degeneration, lumbar region: Secondary | ICD-10-CM | POA: Diagnosis not present

## 2019-12-18 DIAGNOSIS — M461 Sacroiliitis, not elsewhere classified: Secondary | ICD-10-CM | POA: Diagnosis not present

## 2019-12-19 DIAGNOSIS — M5136 Other intervertebral disc degeneration, lumbar region: Secondary | ICD-10-CM | POA: Diagnosis not present

## 2019-12-19 DIAGNOSIS — M9903 Segmental and somatic dysfunction of lumbar region: Secondary | ICD-10-CM | POA: Diagnosis not present

## 2019-12-19 DIAGNOSIS — M461 Sacroiliitis, not elsewhere classified: Secondary | ICD-10-CM | POA: Diagnosis not present

## 2019-12-19 DIAGNOSIS — M9902 Segmental and somatic dysfunction of thoracic region: Secondary | ICD-10-CM | POA: Diagnosis not present

## 2019-12-23 DIAGNOSIS — M9902 Segmental and somatic dysfunction of thoracic region: Secondary | ICD-10-CM | POA: Diagnosis not present

## 2019-12-23 DIAGNOSIS — M461 Sacroiliitis, not elsewhere classified: Secondary | ICD-10-CM | POA: Diagnosis not present

## 2019-12-23 DIAGNOSIS — M9903 Segmental and somatic dysfunction of lumbar region: Secondary | ICD-10-CM | POA: Diagnosis not present

## 2019-12-23 DIAGNOSIS — M5136 Other intervertebral disc degeneration, lumbar region: Secondary | ICD-10-CM | POA: Diagnosis not present

## 2019-12-30 ENCOUNTER — Other Ambulatory Visit: Payer: Self-pay

## 2019-12-30 ENCOUNTER — Encounter: Payer: Self-pay | Admitting: Family Medicine

## 2019-12-30 ENCOUNTER — Ambulatory Visit: Payer: Medicare Other | Admitting: Family Medicine

## 2019-12-30 VITALS — BP 120/80 | HR 76 | Ht 69.0 in | Wt 172.0 lb

## 2019-12-30 DIAGNOSIS — M5136 Other intervertebral disc degeneration, lumbar region: Secondary | ICD-10-CM | POA: Diagnosis not present

## 2019-12-30 DIAGNOSIS — E7801 Familial hypercholesterolemia: Secondary | ICD-10-CM | POA: Diagnosis not present

## 2019-12-30 MED ORDER — MELOXICAM 7.5 MG PO TABS: 7.5000 mg | ORAL_TABLET | Freq: Every day | ORAL | 5 refills | Status: DC

## 2019-12-30 NOTE — Progress Notes (Signed)
Date:  12/30/2019   Name:  Clayton [name]   DOB:  [birth year]   MRN:  [id number]   Chief Complaint: Back Pain (lower back pain radiating down both legs to hips and thighs. Taking Advil for pain- not helping. Had xray with Dr Raynelle Chary and he has told him he has what sounds like a bulging disc)  Back Pain This is a chronic problem. The current episode started more than 1 year ago. The problem occurs intermittently. The problem is unchanged. The pain is present in the lumbar spine. The quality of the pain is described as aching. The pain radiates to the right thigh and left thigh. The pain is at a severity of 6/10. The pain is moderate. The symptoms are aggravated by bending and twisting. Pertinent negatives include no abdominal pain, bladder incontinence, bowel incontinence, chest pain, dysuria, fever, headaches, numbness, paresthesias, tingling, weakness or weight loss.    Lab Results  Component Value Date   CREATININE 1.10 11/26/2019   BUN 13 11/26/2019   NA 140 11/26/2019   K 4.5 11/26/2019   CL 104 11/26/2019   CO2 22 11/26/2019   Lab Results  Component Value Date   CHOL 205 (H) 11/26/2019   HDL 46 11/26/2019   LDLCALC 140 (H) 11/26/2019   TRIG 105 11/26/2019   CHOLHDL 3.5 12/16/2015   No results found for: TSH No results found for: HGBA1C Lab Results  Component Value Date   WBC 7.4 11/26/2019   HGB 14.8 11/26/2019   HCT 43.9 11/26/2019   MCV 97 11/26/2019   PLT 313 11/26/2019   Lab Results  Component Value Date   ALT 18 11/26/2019   AST 23 11/26/2019   ALKPHOS 62 11/26/2019   BILITOT 0.6 11/26/2019     Review of Systems  Constitutional: Negative for chills, fever and weight loss.  HENT: Negative for drooling, ear discharge, ear pain and sore throat.   Respiratory: Negative for cough, shortness of breath and wheezing.   Cardiovascular: Negative for chest pain, palpitations and leg swelling.  Gastrointestinal: Negative for abdominal pain, blood in stool,  bowel incontinence, constipation, diarrhea and nausea.  Endocrine: Negative for polydipsia.  Genitourinary: Negative for bladder incontinence, dysuria, frequency, hematuria and urgency.  Musculoskeletal: Positive for back pain. Negative for myalgias and neck pain.  Skin: Negative for rash.  Allergic/Immunologic: Negative for environmental allergies.  Neurological: Negative for dizziness, tingling, weakness, numbness, headaches and paresthesias.  Hematological: Does not bruise/bleed easily.  Psychiatric/Behavioral: Negative for suicidal ideas. The patient is not nervous/anxious.     Patient Active Problem List   Diagnosis Date Noted  . Symptomatic anemia 09/03/2018  . GI bleed 09/02/2018  . Diverticulosis of colon with hemorrhage   . Acute gastrointestinal hemorrhage   . Hiatal hernia   . Stomach irritation   . Diverticulosis of small intestine without hemorrhage   . HTN (hypertension) 08/30/2018  . CKD (chronic kidney disease), stage III 08/30/2018  . Blood in stool 08/30/2018  . Deep vein thrombosis (DVT) of proximal vein of right lower extremity ([place]) 06/14/2017    Allergies  Allergen Reactions  . Aspirin Rash  . Sulfa Antibiotics Rash    Past Surgical History:  Procedure Laterality Date  . COLONOSCOPY  2014   cleared for 5 yrs  . COLONOSCOPY N/A 09/01/2018   Procedure: COLONOSCOPY;  Surgeon: Virgel Manifold, MD;  Location: [facility] ENDOSCOPY;  Service: Endoscopy;  Laterality: N/A;  . CRANIECTOMY FOR DEPRESSED SKULL FRACTURE    . ESOPHAGOGASTRODUODENOSCOPY  N/A 09/01/2018   Procedure: ESOPHAGOGASTRODUODENOSCOPY (EGD);  Surgeon: Virgel Manifold, MD;  Location: [facility] ENDOSCOPY;  Service: Endoscopy;  Laterality: N/A;  . IVC FILTER REMOVAL N/A 07/03/2017   Procedure: IVC FILTER REMOVAL;  Surgeon: Algernon Huxley, MD;  Location: [place] CV LAB;  Service: Cardiovascular;  Laterality: N/A;  . PERIPHERAL VASCULAR THROMBECTOMY Right 05/22/2017   Procedure: PERIPHERAL VASCULAR  THROMBECTOMY;  Surgeon: Algernon Huxley, MD;  Location: [place] [facility] LAB;  Service: Cardiovascular;  Laterality: Right;    Social History   Tobacco Use  . Smoking status: Former Smoker    Quit date: 01/07/1949    Years since quitting: 71.0  . Smokeless tobacco: Never Used  Vaping Use  . Vaping Use: Never used  Substance Use Topics  . Alcohol use: Yes    Alcohol/week: 1.0 standard drink    Types: 1 Glasses of wine per week    Comment: once a week  . Drug use: No     Medication list has been reviewed and updated.  Current Meds  Medication Sig  . acetaminophen (TYLENOL) 325 MG tablet Take 650 mg by mouth every 6 (six) hours as needed.    PHQ 2/9 Scores 12/30/2019 11/26/2019 10/23/2019 02/20/2019  PHQ - 2 Score 0 0 0 0  PHQ- 9 Score 0 1 2 -    GAD 7 : Generalized Anxiety Score 12/30/2019 11/26/2019  Nervous, Anxious, on Edge 0 0  Control/stop worrying 0 0  Worry too much - different things 0 0  Trouble relaxing 0 0  Restless 0 0  Easily annoyed or irritable 0 0  Afraid - awful might happen 0 0  Total GAD 7 Score 0 0    BP Readings from Last 3 Encounters:  12/30/19 120/80  11/26/19 (!) 130/80  10/23/19 (!) 142/82    Physical Exam Vitals and nursing note reviewed.  HENT:     Head: Normocephalic.     Right Ear: External ear normal.     Left Ear: External ear normal.     Nose: Nose normal.     Mouth/Throat:     Mouth: Mucous membranes are moist.     Pharynx: Oropharynx is clear. No posterior oropharyngeal erythema.  Eyes:     General: No scleral icterus.       Right eye: No discharge.        Left eye: No discharge.     Conjunctiva/sclera: Conjunctivae normal.     Pupils: Pupils are equal, round, and reactive to light.  Neck:     Thyroid: No thyromegaly.     Vascular: No JVD.     Trachea: No tracheal deviation.  Cardiovascular:     Rate and Rhythm: Normal rate and regular rhythm.     Heart sounds: Normal heart sounds. No murmur heard.  No friction rub. No  gallop.   Pulmonary:     Effort: No respiratory distress.     Breath sounds: Normal breath sounds. No wheezing or rales.  Abdominal:     General: Bowel sounds are normal.     Palpations: Abdomen is soft. There is no hepatomegaly, splenomegaly or mass.     Tenderness: There is no abdominal tenderness. There is no guarding or rebound.  Genitourinary:    Rectum: Normal. Guaiac result negative.  Musculoskeletal:        General: No tenderness.     Cervical back: Normal range of motion and neck supple.     Lumbar back: Spasms present. No swelling,  deformity, tenderness or bony tenderness. Positive right straight leg raise test and positive left straight leg raise test.     Comments: No spinous process tenderness  Lymphadenopathy:     Cervical: No cervical adenopathy.  Skin:    General: Skin is warm.     Capillary Refill: Capillary refill takes less than 2 seconds.     Findings: No rash.  Neurological:     Mental Status: He is alert and oriented to person, place, and time.     Cranial Nerves: No cranial nerve deficit.     Motor: Motor function is intact.     Deep Tendon Reflexes: Reflexes are normal and symmetric.     Reflex Scores:      Patellar reflexes are 2+ on the right side and 2+ on the left side.    Wt Readings from Last 3 Encounters:  12/30/19 172 lb (78 kg)  11/26/19 175 lb (79.4 kg)  10/23/19 174 lb (78.9 kg)    BP 120/80   Pulse 76   Ht 5\' 9"  (1.753 m)   Wt 172 lb (78 kg)   BMI 25.40 kg/m   Assessment and Plan: 1. Degenerative disc disease, lumbar Relatively new onset.  Persistent.  Episodic in intensity.  Is exacerbated with certain activity or movement.  Chiropractor is following patient at this time and describes what may be a disc circumstance.  We will add a nonsteroidal anti-inflammatory meloxicam at low-dose 7.5 mg daily given his age.  Review of endoscopy showed no gastritis nor ulcers of concern.  If this does not correct the symptomatology her next stage  will be further evaluation with orthopedics as discussed with patient.  Given his age I would like to avoid going with muscle relaxants and prednisone at this time. - meloxicam (MOBIC) 7.5 MG tablet; Take 1 tablet (7.5 mg total) by mouth daily.  Dispense: 30 tablet; Refill: 5  2. Familial hypercholesterolemia Review of patient's lab work notes that there is some elevation of the LDL and patient has been given a cholesterol sheet for which to follow a low-cholesterol approach.

## 2019-12-30 NOTE — Patient Instructions (Signed)

## 2020-01-01 ENCOUNTER — Encounter: Payer: Self-pay | Admitting: Family Medicine

## 2020-01-27 ENCOUNTER — Telehealth: Payer: Self-pay | Admitting: Family Medicine

## 2020-01-27 ENCOUNTER — Other Ambulatory Visit: Payer: Self-pay

## 2020-01-27 DIAGNOSIS — M5136 Other intervertebral disc degeneration, lumbar region: Secondary | ICD-10-CM

## 2020-01-27 NOTE — Telephone Encounter (Signed)
Placed ref to ortho and called pt

## 2020-01-27 NOTE — Telephone Encounter (Signed)
Copied from [place] [phone]. Topic: General - Other >> Jan 27, 2020  8:13 AM Celene Kras wrote: Reason for CRM: Pts wife called stating that the pt is still dealing with sciatica pain. She states that this has been going on for 46 days and that he is not able to work or walk longer than 3 minutes. She states that the pt has been to the chiropractor and been taking the prescribed medication. She is requesting to have a referral for the pt or next steps from PCP. Please advise.

## 2020-01-27 NOTE — Progress Notes (Unsigned)
Ref to ortho placed

## 2020-01-31 ENCOUNTER — Other Ambulatory Visit: Payer: Self-pay

## 2020-01-31 ENCOUNTER — Encounter: Payer: Self-pay | Admitting: Family Medicine

## 2020-01-31 ENCOUNTER — Ambulatory Visit (INDEPENDENT_AMBULATORY_CARE_PROVIDER_SITE_OTHER): Payer: Medicare Other | Admitting: Family Medicine

## 2020-01-31 VITALS — BP 140/88 | HR 76 | Ht 69.0 in | Wt 172.0 lb

## 2020-01-31 DIAGNOSIS — M5136 Other intervertebral disc degeneration, lumbar region: Secondary | ICD-10-CM

## 2020-01-31 MED ORDER — PREDNISONE 10 MG PO TABS: 10.0000 mg | ORAL_TABLET | Freq: Every day | ORAL | 0 refills | Status: DC

## 2020-01-31 MED ORDER — CYCLOBENZAPRINE HCL 5 MG PO TABS: 5.0000 mg | ORAL_TABLET | Freq: Every day | ORAL | 1 refills | Status: DC

## 2020-01-31 MED ORDER — TRAMADOL HCL 50 MG PO TABS: 50.0000 mg | ORAL_TABLET | Freq: Three times a day (TID) | ORAL | 0 refills | Status: AC | PRN

## 2020-01-31 NOTE — Telephone Encounter (Signed)
Patients wife called this morning crying saying that her husband is unable to function with his back and wants to know if we can get him in with ortho as soon as possible. I told her the referral was just faxed on 01/27/2020.   She said patient is in severe pain with his back - can only walk to the bathroom and he needs his wife assistance. He is now waking up saying " wife is not worth living." She is very upset and said her husband is hopeless and has no pain relief.   Called [facility] to get patient scheduled ASAP. Schedule patient an appointment for 02/05/2020 at 10:15 AM.  Called and let patient wife know of appt time and date at [facility].  Patient requesting pain medication. Explained he needs to come in before we can prescribe him something. Scheduled this afternoon at 4pm for his back pain.   CM

## 2020-01-31 NOTE — Progress Notes (Signed)
Date:  01/31/2020   Name:  Clayton [name]   DOB:  [birth year]   MRN:  [id number]   Chief Complaint: Hip Pain (DDD lumbar radiating to front L) hip)  Hip Pain  The incident occurred more than 1 week ago. There was no injury mechanism. The pain is present in the left hip. Associated symptoms include numbness and tingling.  Back Pain This is a recurrent problem. The current episode started more than 1 month ago. The problem occurs daily (better in morning/worse during the day/). The problem has been gradually worsening since onset. The pain is present in the lumbar spine. The quality of the pain is described as aching. The pain is at a severity of 8/10. The pain is worse during the night. The symptoms are aggravated by sitting, bending and twisting. Associated symptoms include leg pain, numbness, paresthesias and tingling. Pertinent negatives include no abdominal pain, bladder incontinence, bowel incontinence, chest pain, dysuria, fever, headaches, paresis or weight loss. He has tried chiropractic manipulation for the symptoms. The treatment provided no relief.    Lab Results  Component Value Date   CREATININE 1.10 11/26/2019   BUN 13 11/26/2019   NA 140 11/26/2019   K 4.5 11/26/2019   CL 104 11/26/2019   CO2 22 11/26/2019   Lab Results  Component Value Date   CHOL 205 (H) 11/26/2019   HDL 46 11/26/2019   LDLCALC 140 (H) 11/26/2019   TRIG 105 11/26/2019   CHOLHDL 3.5 12/16/2015   No results found for: TSH No results found for: HGBA1C Lab Results  Component Value Date   WBC 7.4 11/26/2019   HGB 14.8 11/26/2019   HCT 43.9 11/26/2019   MCV 97 11/26/2019   PLT 313 11/26/2019   Lab Results  Component Value Date   ALT 18 11/26/2019   AST 23 11/26/2019   ALKPHOS 62 11/26/2019   BILITOT 0.6 11/26/2019     Review of Systems  Constitutional: Positive for appetite change. Negative for chills, fever and weight loss.  HENT: Negative for drooling, ear discharge, ear pain and  sore throat.   Respiratory: Negative for cough, shortness of breath and wheezing.   Cardiovascular: Negative for chest pain, palpitations and leg swelling.  Gastrointestinal: Negative for abdominal pain, blood in stool, bowel incontinence, constipation, diarrhea and nausea.  Endocrine: Negative for polydipsia.  Genitourinary: Negative for bladder incontinence, dysuria, frequency, hematuria and urgency.  Musculoskeletal: Positive for back pain. Negative for myalgias and neck pain.  Skin: Negative for rash.  Allergic/Immunologic: Negative for environmental allergies.  Neurological: Positive for tingling, numbness and paresthesias. Negative for dizziness and headaches.  Hematological: Does not bruise/bleed easily.  Psychiatric/Behavioral: Negative for suicidal ideas. The patient is not nervous/anxious.     Patient Active Problem List   Diagnosis Date Noted  . Symptomatic anemia 09/03/2018  . GI bleed 09/02/2018  . Diverticulosis of colon with hemorrhage   . Acute gastrointestinal hemorrhage   . Hiatal hernia   . Stomach irritation   . Diverticulosis of small intestine without hemorrhage   . HTN (hypertension) 08/30/2018  . CKD (chronic kidney disease), stage III ([place]) 08/30/2018  . Blood in stool 08/30/2018  . Deep vein thrombosis (DVT) of proximal vein of right lower extremity ([place]) 06/14/2017    Allergies  Allergen Reactions  . Aspirin Rash  . Sulfa Antibiotics Rash    Past Surgical History:  Procedure Laterality Date  . COLONOSCOPY  2014   cleared for 5 yrs  . COLONOSCOPY N/A  09/01/2018   Procedure: COLONOSCOPY;  Surgeon: Virgel Manifold, MD;  Location: [facility] ENDOSCOPY;  Service: Endoscopy;  Laterality: N/A;  . CRANIECTOMY FOR DEPRESSED SKULL FRACTURE    . ESOPHAGOGASTRODUODENOSCOPY N/A 09/01/2018   Procedure: ESOPHAGOGASTRODUODENOSCOPY (EGD);  Surgeon: Virgel Manifold, MD;  Location: [facility] ENDOSCOPY;  Service: Endoscopy;  Laterality: N/A;  . IVC FILTER REMOVAL N/A  07/03/2017   Procedure: IVC FILTER REMOVAL;  Surgeon: Algernon Huxley, MD;  Location: [facility];  Service: Cardiovascular;  Laterality: N/A;  . PERIPHERAL VASCULAR THROMBECTOMY Right 05/22/2017   Procedure: PERIPHERAL VASCULAR THROMBECTOMY;  Surgeon: Algernon Huxley, MD;  Location: [facility];  Service: Cardiovascular;  Laterality: Right;    Social History   Tobacco Use  . Smoking status: Former Smoker    Quit date: 01/07/1949    Years since quitting: 71.1  . Smokeless tobacco: Never Used  Vaping Use  . Vaping Use: Never used  Substance Use Topics  . Alcohol use: Yes    Alcohol/week: 1.0 standard drink    Types: 1 Glasses of wine per week    Comment: once a week  . Drug use: No     Medication list has been reviewed and updated.  Current Meds  Medication Sig  . acetaminophen (TYLENOL) 325 MG tablet Take 650 mg by mouth every 6 (six) hours as needed.  . meloxicam (MOBIC) 7.5 MG tablet Take 1 tablet (7.5 mg total) by mouth daily.    PHQ 2/9 Scores 01/31/2020 12/30/2019 11/26/2019 10/23/2019  PHQ - 2 Score 0 0 0 0  PHQ- 9 Score 4 0 1 2    GAD 7 : Generalized Anxiety Score 01/31/2020 12/30/2019 11/26/2019  Nervous, Anxious, on Edge 0 0 0  Control/stop worrying 0 0 0  Worry too much - different things 0 0 0  Trouble relaxing 1 0 0  Restless 0 0 0  Easily annoyed or irritable 0 0 0  Afraid - awful might happen 0 0 0  Total GAD 7 Score 1 0 0  Anxiety Difficulty Not difficult at all - -    BP Readings from Last 3 Encounters:  01/31/20 140/88  12/30/19 120/80  11/26/19 (!) 130/80    Physical Exam Vitals and nursing note reviewed.  Constitutional:      Appearance: Normal appearance.  HENT:     Head: Normocephalic.     Right Ear: Tympanic membrane, ear canal and external ear normal.     Left Ear: Tympanic membrane, ear canal and external ear normal.     Nose: Nose normal.  Eyes:     General: No scleral icterus.       Right eye: No discharge.        Left eye:  No discharge.     Conjunctiva/sclera: Conjunctivae normal.     Pupils: Pupils are equal, round, and reactive to light.  Neck:     Thyroid: No thyromegaly.     Vascular: No JVD.     Trachea: No tracheal deviation.  Cardiovascular:     Rate and Rhythm: Normal rate and regular rhythm.     Heart sounds: Normal heart sounds. No murmur heard.  No friction rub. No gallop.   Pulmonary:     Effort: No respiratory distress.     Breath sounds: Normal breath sounds. No wheezing or rales.  Abdominal:     General: Bowel sounds are normal.     Palpations: Abdomen is soft. There is no mass.  Tenderness: There is no abdominal tenderness. There is no guarding or rebound.  Musculoskeletal:        General: No tenderness.     Cervical back: Normal range of motion and neck supple.     Lumbar back: Spasms present. No signs of trauma or tenderness. Decreased range of motion. Negative right straight leg raise test and negative left straight leg raise test.  Lymphadenopathy:     Cervical: No cervical adenopathy.  Skin:    General: Skin is warm.     Findings: No rash.  Neurological:     Mental Status: He is alert and oriented to person, place, and time.     Cranial Nerves: No cranial nerve deficit.     Sensory: Sensation is intact.     Motor: Motor function is intact. No weakness.     Deep Tendon Reflexes: Reflexes are normal and symmetric.     Reflex Scores:      Patellar reflexes are 2+ on the right side and 2+ on the left side.      Achilles reflexes are 2+ on the right side and 2+ on the left side.    Wt Readings from Last 3 Encounters:  01/31/20 172 lb (78 kg)  12/30/19 172 lb (78 kg)  11/26/19 175 lb (79.4 kg)    BP 140/88   Pulse 76   Ht 5\' 9"  (1.753 m)   Wt 172 lb (78 kg)   BMI 25.40 kg/m   Assessment and Plan: 1. Degenerative disc disease, lumbar Recurrent.  New redevelopment over the past 3 to 4 weeks.  Presentation similar to April 2020.  Review of x-rays at that time notes  degenerative changes at L3-L1.  Exam is consistent with degenerative concerns with lumbar strain.  Neurologic exam is unremarkable for any loss of sensory motor or reflexes.  Patient will continue meloxicam but in addition we will begin prednisone 10 mg once a day, cyclobenzaprine 5 mg at night, and tramadol on a as needed basis every 8 hours for severe pain.  Appointments been made with orthopedics for Wednesday with [facility] Monday extranodal clinic [place].

## 2020-02-05 ENCOUNTER — Other Ambulatory Visit: Payer: Self-pay | Admitting: Orthopedic Surgery

## 2020-02-05 DIAGNOSIS — M48062 Spinal stenosis, lumbar region with neurogenic claudication: Secondary | ICD-10-CM | POA: Diagnosis not present

## 2020-02-05 DIAGNOSIS — M5136 Other intervertebral disc degeneration, lumbar region: Secondary | ICD-10-CM | POA: Diagnosis not present

## 2020-02-05 DIAGNOSIS — M5442 Lumbago with sciatica, left side: Secondary | ICD-10-CM | POA: Diagnosis not present

## 2020-02-05 DIAGNOSIS — G8929 Other chronic pain: Secondary | ICD-10-CM

## 2020-02-05 DIAGNOSIS — M4807 Spinal stenosis, lumbosacral region: Secondary | ICD-10-CM | POA: Diagnosis not present

## 2020-02-22 ENCOUNTER — Ambulatory Visit
Admission: RE | Admit: 2020-02-22 | Discharge: 2020-02-22 | Disposition: A | Discharge status: Home / Self Care | Payer: Medicare Other | Source: Ambulatory Visit | Attending: Orthopedic Surgery | Admitting: Orthopedic Surgery

## 2020-02-22 ENCOUNTER — Other Ambulatory Visit: Payer: Self-pay

## 2020-02-22 DIAGNOSIS — M48062 Spinal stenosis, lumbar region with neurogenic claudication: Secondary | ICD-10-CM

## 2020-02-22 DIAGNOSIS — G8929 Other chronic pain: Secondary | ICD-10-CM

## 2020-02-22 DIAGNOSIS — M5136 Other intervertebral disc degeneration, lumbar region: Secondary | ICD-10-CM | POA: Diagnosis not present

## 2020-02-22 DIAGNOSIS — M5442 Lumbago with sciatica, left side: Secondary | ICD-10-CM | POA: Insufficient documentation

## 2020-02-22 DIAGNOSIS — M545 Low back pain, unspecified: Secondary | ICD-10-CM | POA: Diagnosis not present

## 2020-02-25 DIAGNOSIS — M48062 Spinal stenosis, lumbar region with neurogenic claudication: Secondary | ICD-10-CM | POA: Diagnosis not present

## 2020-02-25 DIAGNOSIS — M5442 Lumbago with sciatica, left side: Secondary | ICD-10-CM | POA: Insufficient documentation

## 2020-02-27 DIAGNOSIS — M48062 Spinal stenosis, lumbar region with neurogenic claudication: Secondary | ICD-10-CM | POA: Diagnosis not present

## 2020-02-27 DIAGNOSIS — M5442 Lumbago with sciatica, left side: Secondary | ICD-10-CM | POA: Diagnosis not present

## 2020-03-12 DIAGNOSIS — M5442 Lumbago with sciatica, left side: Secondary | ICD-10-CM | POA: Diagnosis not present

## 2020-03-12 DIAGNOSIS — M48062 Spinal stenosis, lumbar region with neurogenic claudication: Secondary | ICD-10-CM | POA: Diagnosis not present

## 2020-03-20 DIAGNOSIS — M5442 Lumbago with sciatica, left side: Secondary | ICD-10-CM | POA: Diagnosis not present

## 2020-03-20 DIAGNOSIS — M48062 Spinal stenosis, lumbar region with neurogenic claudication: Secondary | ICD-10-CM | POA: Diagnosis not present

## 2020-04-03 DIAGNOSIS — M5442 Lumbago with sciatica, left side: Secondary | ICD-10-CM | POA: Diagnosis not present

## 2020-04-03 DIAGNOSIS — M48062 Spinal stenosis, lumbar region with neurogenic claudication: Secondary | ICD-10-CM | POA: Diagnosis not present

## 2020-04-07 ENCOUNTER — Other Ambulatory Visit: Payer: Self-pay | Admitting: Neurosurgery

## 2020-04-07 DIAGNOSIS — M48062 Spinal stenosis, lumbar region with neurogenic claudication: Secondary | ICD-10-CM | POA: Diagnosis not present

## 2020-04-14 ENCOUNTER — Other Ambulatory Visit: Payer: Self-pay

## 2020-04-14 ENCOUNTER — Encounter
Admission: RE | Admit: 2020-04-14 | Discharge: 2020-04-14 | Disposition: A | Discharge status: Home / Self Care | Payer: Medicare Other | Source: Ambulatory Visit | Attending: Neurosurgery | Admitting: Neurosurgery

## 2020-04-14 DIAGNOSIS — I440 Atrioventricular block, first degree: Secondary | ICD-10-CM | POA: Diagnosis not present

## 2020-04-14 DIAGNOSIS — Z01818 Encounter for other preprocedural examination: Secondary | ICD-10-CM | POA: Diagnosis not present

## 2020-04-14 HISTORY — DX: Multiple fractures of ribs, unspecified side, initial encounter for closed fracture: S22.49XA

## 2020-04-14 HISTORY — DX: Fracture of unspecified carpal bone, unspecified wrist, initial encounter for closed fracture: S62.109A

## 2020-04-14 LAB — BASIC METABOLIC PANEL
Anion gap: 11 (ref 5–15)
BUN: 14 mg/dL (ref 8–23)
CO2: 27 mmol/L (ref 22–32)
Calcium: 9.4 mg/dL (ref 8.9–10.3)
Chloride: 101 mmol/L (ref 98–111)
Creatinine, Ser: 1.14 mg/dL (ref 0.61–1.24)
GFR, Estimated: >60 mL/min (ref >60)
Glucose, Bld: 100 mg/dL — ABNORMAL HIGH (ref 70–99)
Potassium: 4.1 mmol/L (ref 3.5–5.1)
Sodium: 139 mmol/L (ref 135–145)

## 2020-04-14 LAB — CBC
HCT: 45.9 % (ref 39.0–52.0)
Hemoglobin: 15.2 g/dL (ref 13.0–17.0)
MCH: 32.1 pg (ref 26.0–34.0)
MCHC: 33.1 g/dL (ref 30.0–36.0)
MCV: 97 fL (ref 80.0–100.0)
Platelets: 402 10*3/uL — ABNORMAL HIGH (ref 150–400)
RBC: 4.73 MIL/uL (ref 4.22–5.81)
RDW: 13.1 % (ref 11.5–15.5)
WBC: 8.9 10*3/uL (ref 4.0–10.5)
nRBC: 0 % (ref 0.0–0.2)

## 2020-04-14 LAB — TYPE AND SCREEN
ABO/RH(D): AB POS
Antibody Screen: NEGATIVE

## 2020-04-14 LAB — URINALYSIS, ROUTINE W REFLEX MICROSCOPIC
Bilirubin Urine: NEGATIVE
Glucose, UA: NEGATIVE mg/dL
Hgb urine dipstick: NEGATIVE
Ketones, ur: 5 mg/dL — AB
Leukocytes,Ua: NEGATIVE
Nitrite: NEGATIVE
Protein, ur: NEGATIVE mg/dL
Specific Gravity, Urine: 1.015 (ref 1.005–1.030)
pH: 5 (ref 5.0–8.0)

## 2020-04-14 LAB — APTT: aPTT: 29 seconds (ref 24–36)

## 2020-04-14 LAB — PROTIME-INR
INR: 1 (ref 0.8–1.2)
Prothrombin Time: 12.6 seconds (ref 11.4–15.2)

## 2020-04-14 LAB — SURGICAL PCR SCREEN
MRSA, PCR: NEGATIVE
Staphylococcus aureus: POSITIVE — AB

## 2020-04-14 NOTE — Patient Instructions (Signed)
Your procedure is scheduled on: April 20, 2020 MONDAY Report to the Registration Desk on the 1st floor of the [facility]. To find out your arrival time, please call [phone] between 1PM - 3PM on: Friday April 17, 2020  REMEMBER: Instructions that are not followed completely may result in serious medical risk, up to and including death; or upon the discretion of your surgeon and anesthesiologist your surgery may need to be rescheduled.  Do not eat food after midnight the night before surgery.  No gum chewing, lozengers or hard candies.  You may however, drink CLEAR liquids up to 2 hours before you are scheduled to arrive for your surgery. Do not drink anything within 2 hours of your scheduled arrival time.  Clear liquids include: - water  - apple juice without pulp - gatorade (not RED, PURPLE, OR BLUE) - black coffee or tea (Do NOT add milk or creamers to the coffee or tea) Do NOT drink anything that is not on this list.  Type 1 and Type 2 diabetics should only drink water.   TAKE THESE MEDICATIONS THE MORNING OF SURGERY WITH A SIP OF WATER: PAIN PILL IF NEEDED  One week prior to surgery: Stop Anti-inflammatories (NSAIDS) such as Advil, Aleve, Ibuprofen, Motrin, Naproxen, Naprosyn and Aspirin based products such as Excedrin, Goodys Powder, BC Powder. Stop ANY OVER THE COUNTER supplements until after surgery. (However, you may continue taking Vitamin D, Vitamin B, and multivitamin up until the day before surgery.)  No Alcohol for 24 hours before or after surgery.  No Smoking including e-cigarettes for 24 hours prior to surgery.  No chewable tobacco products for at least 6 hours prior to surgery.  No nicotine patches on the day of surgery.  Do not use any "recreational" drugs for at least a week prior to your surgery.  Please be advised that the combination of cocaine and anesthesia may have negative outcomes, up to and including death. If you test positive for  cocaine, your surgery will be cancelled.  On the morning of surgery brush your teeth with toothpaste and water, you may rinse your mouth with mouthwash if you wish. Do not swallow any toothpaste or mouthwash.  Do not wear jewelry, make-up, hairpins, clips or nail polish.  Do not wear lotions, powders, or perfumes.   Do not shave body from the neck down 48 hours prior to surgery just in case you cut yourself which could leave a site for infection.  Also, freshly shaved skin may become irritated if using the CHG soap.  Contact lenses, hearing aids and dentures may not be worn into surgery.  Do not bring valuables to the hospital. [facility] is not responsible for any missing/lost belongings or valuables.   Use CHG Soap  as directed on instruction sheet.  Notify your doctor if there is any change in your medical condition (cold, fever, infection).  Wear comfortable clothing (specific to your surgery type) to the hospital.  Plan for stool softeners for home use; pain medications have a tendency to cause constipation. You can also help prevent constipation by eating foods high in fiber such as fruits and vegetables and drinking plenty of fluids as your diet allows.  After surgery, you can help prevent lung complications by doing breathing exercises.  Take deep breaths and cough every 1-2 hours. Your doctor may order a device called an Incentive Spirometer to help you take deep breaths. When coughing or sneezing, hold a pillow firmly against your incision with both  hands. This is called splinting. Doing this helps protect your incision. It also decreases belly discomfort.  If you are being admitted to the hospital overnight, [place]  If you are being discharged the day of surgery, you will not be allowed to drive home. You will need a responsible adult (18 years or older) to drive you home and stay with you that night.   Please call the [facility]  Dept. at [phone] if you have any questions about these instructions.  Visitation Policy:  Patients undergoing a surgery or procedure may have one family member or support person with them as long as that person is not COVID-19 positive or experiencing its symptoms.  That person may remain in the waiting area during the procedure.  Inpatient Visitation Update:   In an effort to ensure the safety of our team members and our patients, we are implementing a change to our visitation policy:  Effective Monday, Aug. 9, at 7 a.m., inpatients will be allowed one support person.  o The support person may change daily.  o The support person must pass our screening, gel in and out, and wear a mask at all times, including in the patients room.  o Patients must also wear a mask when staff or their support person are in the room.  o Masking is required regardless of vaccination status.  Systemwide, no visitors 17 or younger.

## 2020-04-16 ENCOUNTER — Other Ambulatory Visit
Admission: RE | Admit: 2020-04-16 | Discharge: 2020-04-16 | Disposition: A | Discharge status: Home / Self Care | Payer: Medicare Other | Source: Ambulatory Visit | Attending: Neurosurgery | Admitting: Neurosurgery

## 2020-04-16 ENCOUNTER — Other Ambulatory Visit: Payer: Self-pay

## 2020-04-16 DIAGNOSIS — Z01812 Encounter for preprocedural laboratory examination: Secondary | ICD-10-CM | POA: Diagnosis not present

## 2020-04-16 DIAGNOSIS — Z20822 Contact with and (suspected) exposure to covid-19: Secondary | ICD-10-CM | POA: Insufficient documentation

## 2020-04-16 LAB — SARS CORONAVIRUS 2 (TAT 6-24 HRS): SARS Coronavirus 2: NEGATIVE

## 2020-04-17 DIAGNOSIS — M48062 Spinal stenosis, lumbar region with neurogenic claudication: Secondary | ICD-10-CM | POA: Diagnosis not present

## 2020-04-19 MED ORDER — LACTATED RINGERS IV SOLN: INTRAVENOUS | Status: DC

## 2020-04-19 MED ORDER — CEFAZOLIN SODIUM-DEXTROSE 2-4 GM/100ML-% IV SOLN
2.0000 g | INTRAVENOUS | Status: AC
  Administered 2020-04-20: 2 g via INTRAVENOUS

## 2020-04-19 MED ORDER — CHLORHEXIDINE GLUCONATE 0.12 % MT SOLN: 15.0000 mL | Freq: Once | OROMUCOSAL | Status: AC

## 2020-04-19 MED ORDER — ORAL CARE MOUTH RINSE: 15.0000 mL | Freq: Once | OROMUCOSAL | Status: AC

## 2020-04-19 MED ORDER — FAMOTIDINE 20 MG PO TABS: 20.0000 mg | ORAL_TABLET | Freq: Once | ORAL | Status: AC

## 2020-04-19 NOTE — Progress Notes (Signed)
Pharmacy Antibiotic Note  Clayton [name] is a 85 y.o. male admitted on (Not on file) with surgical prophylaxis.  Pharmacy has been consulted for Cefazolin dosing.  Plan: Cefazolin 2 gm IV X 1 60 min pre-op.      No data recorded.  Recent Labs  Lab 04/14/20 1212  WBC 8.9  CREATININE 1.14    Estimated Creatinine Clearance: 49.1 mL/min (by C-G formula based on SCr of 1.14 mg/dL).    Allergies  Allergen Reactions  . Aspirin Rash  . Sulfa Antibiotics Rash    Antimicrobials this admission:   >>    >>   Dose adjustments this admission:   Microbiology results:  BCx:   UCx:    Sputum:    MRSA PCR:   Thank you for allowing pharmacy to be a part of this patient's care.  Tima Curet D 04/19/2020 11:30 PM

## 2020-04-20 ENCOUNTER — Ambulatory Visit: Payer: Medicare Other

## 2020-04-20 ENCOUNTER — Ambulatory Visit: Payer: Medicare Other | Admitting: Certified Registered Nurse Anesthetist

## 2020-04-20 ENCOUNTER — Encounter
Admission: RE | Disposition: A | Discharge status: Home / Self Care | Payer: Self-pay | Source: Home / Self Care | Attending: Neurosurgery

## 2020-04-20 ENCOUNTER — Other Ambulatory Visit: Payer: Self-pay

## 2020-04-20 ENCOUNTER — Encounter: Payer: Self-pay | Admitting: Neurosurgery

## 2020-04-20 ENCOUNTER — Ambulatory Visit
Admission: RE | Admit: 2020-04-20 | Discharge: 2020-04-20 | Disposition: A | Discharge status: Home / Self Care | Payer: Medicare Other | Attending: Neurosurgery | Admitting: Neurosurgery

## 2020-04-20 DIAGNOSIS — M5416 Radiculopathy, lumbar region: Secondary | ICD-10-CM | POA: Diagnosis not present

## 2020-04-20 DIAGNOSIS — Z882 Allergy status to sulfonamides status: Secondary | ICD-10-CM | POA: Diagnosis not present

## 2020-04-20 DIAGNOSIS — Z86718 Personal history of other venous thrombosis and embolism: Secondary | ICD-10-CM | POA: Diagnosis not present

## 2020-04-20 DIAGNOSIS — M48061 Spinal stenosis, lumbar region without neurogenic claudication: Secondary | ICD-10-CM | POA: Diagnosis not present

## 2020-04-20 DIAGNOSIS — Z981 Arthrodesis status: Secondary | ICD-10-CM | POA: Diagnosis not present

## 2020-04-20 DIAGNOSIS — M48062 Spinal stenosis, lumbar region with neurogenic claudication: Secondary | ICD-10-CM | POA: Diagnosis not present

## 2020-04-20 DIAGNOSIS — Z79899 Other long term (current) drug therapy: Secondary | ICD-10-CM | POA: Diagnosis not present

## 2020-04-20 DIAGNOSIS — Z419 Encounter for procedure for purposes other than remedying health state, unspecified: Secondary | ICD-10-CM

## 2020-04-20 DIAGNOSIS — Z85828 Personal history of other malignant neoplasm of skin: Secondary | ICD-10-CM | POA: Diagnosis not present

## 2020-04-20 DIAGNOSIS — Z87891 Personal history of nicotine dependence: Secondary | ICD-10-CM | POA: Diagnosis not present

## 2020-04-20 DIAGNOSIS — Z886 Allergy status to analgesic agent status: Secondary | ICD-10-CM | POA: Insufficient documentation

## 2020-04-20 HISTORY — PX: LUMBAR LAMINECTOMY/DECOMPRESSION MICRODISCECTOMY: SHX5026

## 2020-04-20 SURGERY — LUMBAR LAMINECTOMY/DECOMPRESSION MICRODISCECTOMY 2 LEVELS
Anesthesia: General

## 2020-04-20 MED ORDER — OXYCODONE HCL 5 MG PO TABS: 5.0000 mg | ORAL_TABLET | Freq: Four times a day (QID) | ORAL | 0 refills | Status: AC | PRN

## 2020-04-20 MED ORDER — FENTANYL CITRATE (PF) 100 MCG/2ML IJ SOLN
INTRAMUSCULAR | Status: AC
  Filled 2020-04-20: qty 2

## 2020-04-20 MED ORDER — OXYCODONE HCL 5 MG PO TABS
5.0000 mg | ORAL_TABLET | Freq: Once | ORAL | Status: AC
  Administered 2020-04-20: 5 mg via ORAL

## 2020-04-20 MED ORDER — GLYCOPYRROLATE 0.2 MG/ML IJ SOLN
INTRAMUSCULAR | Status: DC | PRN
  Administered 2020-04-20: .2 mg via INTRAVENOUS

## 2020-04-20 MED ORDER — FENTANYL CITRATE (PF) 100 MCG/2ML IJ SOLN
25.0000 ug | INTRAMUSCULAR | Status: DC | PRN
  Administered 2020-04-20 (×2): 25 ug via INTRAVENOUS

## 2020-04-20 MED ORDER — OXYCODONE HCL 5 MG PO TABS: 5.0000 mg | ORAL_TABLET | Freq: Four times a day (QID) | ORAL | 0 refills | Status: DC | PRN

## 2020-04-20 MED ORDER — METHYLPREDNISOLONE ACETATE 40 MG/ML IJ SUSP
INTRAMUSCULAR | Status: AC
  Filled 2020-04-20: qty 1

## 2020-04-20 MED ORDER — OXYCODONE HCL 5 MG PO TABS
ORAL_TABLET | ORAL | Status: AC
  Filled 2020-04-20: qty 1

## 2020-04-20 MED ORDER — ACETAMINOPHEN 10 MG/ML IV SOLN
INTRAVENOUS | Status: DC | PRN
  Administered 2020-04-20: 1000 mg via INTRAVENOUS

## 2020-04-20 MED ORDER — SUGAMMADEX SODIUM 200 MG/2ML IV SOLN
INTRAVENOUS | Status: DC | PRN
  Administered 2020-04-20: 158.8 mg via INTRAVENOUS

## 2020-04-20 MED ORDER — THROMBIN 5000 UNITS EX SOLR
CUTANEOUS | Status: DC | PRN
  Administered 2020-04-20: 5000 [IU] via TOPICAL

## 2020-04-20 MED ORDER — PROPOFOL 10 MG/ML IV BOLUS
INTRAVENOUS | Status: DC | PRN
  Administered 2020-04-20: 100 mg via INTRAVENOUS
  Administered 2020-04-20: 60 mg via INTRAVENOUS

## 2020-04-20 MED ORDER — BUPIVACAINE-EPINEPHRINE (PF) 0.25% -1:200000 IJ SOLN
INTRAMUSCULAR | Status: AC
  Filled 2020-04-20: qty 30

## 2020-04-20 MED ORDER — FAMOTIDINE 20 MG PO TABS
ORAL_TABLET | ORAL | Status: AC
  Administered 2020-04-20: 20 mg via ORAL
  Filled 2020-04-20: qty 1

## 2020-04-20 MED ORDER — ROCURONIUM BROMIDE 100 MG/10ML IV SOLN
INTRAVENOUS | Status: DC | PRN
  Administered 2020-04-20: 30 mg via INTRAVENOUS

## 2020-04-20 MED ORDER — DEXAMETHASONE SODIUM PHOSPHATE 10 MG/ML IJ SOLN
INTRAMUSCULAR | Status: AC
  Filled 2020-04-20: qty 1

## 2020-04-20 MED ORDER — EPHEDRINE 5 MG/ML INJ
INTRAVENOUS | Status: AC
  Filled 2020-04-20: qty 10

## 2020-04-20 MED ORDER — ACETAMINOPHEN 10 MG/ML IV SOLN
INTRAVENOUS | Status: AC
  Filled 2020-04-20: qty 100

## 2020-04-20 MED ORDER — METHOCARBAMOL 500 MG PO TABS: 500.0000 mg | ORAL_TABLET | Freq: Four times a day (QID) | ORAL | 0 refills | Status: DC | PRN

## 2020-04-20 MED ORDER — METHYLPREDNISOLONE ACETATE 40 MG/ML IJ SUSP: INTRAMUSCULAR | Status: DC | PRN

## 2020-04-20 MED ORDER — EPHEDRINE SULFATE 50 MG/ML IJ SOLN
INTRAMUSCULAR | Status: DC | PRN
  Administered 2020-04-20: 10 mg via INTRAVENOUS

## 2020-04-20 MED ORDER — THROMBIN 5000 UNITS EX SOLR
CUTANEOUS | Status: AC
  Filled 2020-04-20: qty 5000

## 2020-04-20 MED ORDER — FENTANYL CITRATE (PF) 100 MCG/2ML IJ SOLN
INTRAMUSCULAR | Status: AC
  Administered 2020-04-20: 25 ug via INTRAVENOUS
  Filled 2020-04-20: qty 2

## 2020-04-20 MED ORDER — ACETAMINOPHEN 10 MG/ML IV SOLN: 1000.0000 mg | Freq: Once | INTRAVENOUS | Status: DC | PRN

## 2020-04-20 MED ORDER — PHENYLEPHRINE HCL-NACL 10-0.9 MG/250ML-% IV SOLN
INTRAVENOUS | Status: DC | PRN
  Administered 2020-04-20: 35 ug/min via INTRAVENOUS

## 2020-04-20 MED ORDER — OXYCODONE HCL 5 MG/5ML PO SOLN: 5.0000 mg | Freq: Once | ORAL | Status: DC | PRN

## 2020-04-20 MED ORDER — PHENYLEPHRINE HCL (PRESSORS) 10 MG/ML IV SOLN
INTRAVENOUS | Status: DC | PRN
  Administered 2020-04-20: 100 ug via INTRAVENOUS

## 2020-04-20 MED ORDER — ONDANSETRON HCL 4 MG/2ML IJ SOLN: 4.0000 mg | Freq: Once | INTRAMUSCULAR | Status: DC | PRN

## 2020-04-20 MED ORDER — CEFAZOLIN SODIUM-DEXTROSE 2-4 GM/100ML-% IV SOLN
INTRAVENOUS | Status: AC
  Filled 2020-04-20: qty 100

## 2020-04-20 MED ORDER — SUCCINYLCHOLINE CHLORIDE 200 MG/10ML IV SOSY
PREFILLED_SYRINGE | INTRAVENOUS | Status: AC
  Filled 2020-04-20: qty 10

## 2020-04-20 MED ORDER — BUPIVACAINE-EPINEPHRINE (PF) 0.5% -1:200000 IJ SOLN
INTRAMUSCULAR | Status: DC | PRN
  Administered 2020-04-20: 9 mL via PERINEURAL

## 2020-04-20 MED ORDER — OXYCODONE HCL 5 MG PO TABS: 5.0000 mg | ORAL_TABLET | Freq: Once | ORAL | Status: DC | PRN

## 2020-04-20 MED ORDER — DEXAMETHASONE SODIUM PHOSPHATE 10 MG/ML IJ SOLN
INTRAMUSCULAR | Status: DC | PRN
  Administered 2020-04-20: 10 mg via INTRAVENOUS

## 2020-04-20 MED ORDER — LIDOCAINE HCL (CARDIAC) PF 100 MG/5ML IV SOSY
PREFILLED_SYRINGE | INTRAVENOUS | Status: DC | PRN
  Administered 2020-04-20: 100 mg via INTRAVENOUS

## 2020-04-20 MED ORDER — PHENYLEPHRINE HCL (PRESSORS) 10 MG/ML IV SOLN
INTRAVENOUS | Status: AC
  Filled 2020-04-20: qty 1

## 2020-04-20 MED ORDER — SUCCINYLCHOLINE CHLORIDE 20 MG/ML IJ SOLN
INTRAMUSCULAR | Status: DC | PRN
  Administered 2020-04-20: 100 mg via INTRAVENOUS

## 2020-04-20 MED ORDER — PROPOFOL 10 MG/ML IV BOLUS
INTRAVENOUS | Status: AC
  Filled 2020-04-20: qty 20

## 2020-04-20 MED ORDER — FENTANYL CITRATE (PF) 100 MCG/2ML IJ SOLN
INTRAMUSCULAR | Status: DC | PRN
  Administered 2020-04-20 (×2): 25 ug via INTRAVENOUS
  Administered 2020-04-20: 50 ug via INTRAVENOUS
  Administered 2020-04-20: 100 ug via INTRAVENOUS

## 2020-04-20 MED ORDER — LIDOCAINE HCL (PF) 2 % IJ SOLN
INTRAMUSCULAR | Status: AC
  Filled 2020-04-20: qty 5

## 2020-04-20 MED ORDER — ONDANSETRON HCL 4 MG/2ML IJ SOLN
INTRAMUSCULAR | Status: AC
  Filled 2020-04-20: qty 2

## 2020-04-20 MED ORDER — CHLORHEXIDINE GLUCONATE 0.12 % MT SOLN
OROMUCOSAL | Status: AC
  Administered 2020-04-20: 15 mL via OROMUCOSAL
  Filled 2020-04-20: qty 15

## 2020-04-20 MED ORDER — ONDANSETRON HCL 4 MG/2ML IJ SOLN
INTRAMUSCULAR | Status: DC | PRN
  Administered 2020-04-20: 4 mg via INTRAVENOUS

## 2020-04-20 SURGICAL SUPPLY — 65 items
ADH SKN CLS APL DERMABOND .7 (GAUZE/BANDAGES/DRESSINGS) ×2
AGENT HMST MTR 8 SURGIFLO (HEMOSTASIS) ×1
APL PRP STRL LF DISP 70% ISPRP (MISCELLANEOUS) ×1
APL SRG 60D 8 XTD TIP BNDBL (TIP)
BLADE BOVIE TIP EXT 4 (BLADE) ×2 IMPLANT
BUR NEURO DRILL SOFT 3.0X3.8M (BURR) ×2 IMPLANT
CANISTER SUCT 1200ML W/VALVE (MISCELLANEOUS) ×2 IMPLANT
CHLORAPREP W/TINT 26 (MISCELLANEOUS) ×2 IMPLANT
CNTNR SPEC 2.5X3XGRAD LEK (MISCELLANEOUS) ×1
CONT SPEC 4OZ STER OR WHT (MISCELLANEOUS) ×1
CONT SPEC 4OZ STRL OR WHT (MISCELLANEOUS) ×1
CONTAINER SPEC 2.5X3XGRAD LEK (MISCELLANEOUS) ×1 IMPLANT
COUNTER NEEDLE 20/40 LG (NEEDLE) ×2 IMPLANT
COVER LIGHT HANDLE STERIS (MISCELLANEOUS) ×4 IMPLANT
COVER WAND RF STERILE (DRAPES) ×2 IMPLANT
DERMABOND ADVANCED (GAUZE/BANDAGES/DRESSINGS) ×2
DERMABOND ADVANCED .7 DNX12 (GAUZE/BANDAGES/DRESSINGS) ×2 IMPLANT
DRAPE C ARM PK CFD 31 SPINE (DRAPES) ×2 IMPLANT
DRAPE C-ARM 42X70 (DRAPES) IMPLANT
DRAPE C-ARM 42X72 X-RAY (DRAPES) IMPLANT
DRAPE LAPAROTOMY 100X77 ABD (DRAPES) ×2 IMPLANT
DRAPE MICROSCOPE SPINE 48X150 (DRAPES) IMPLANT
DRAPE SURG 17X11 SM STRL (DRAPES) ×4 IMPLANT
DRSG TEGADERM 6X8 (GAUZE/BANDAGES/DRESSINGS) ×2 IMPLANT
DRSG TELFA 4X3 1S NADH ST (GAUZE/BANDAGES/DRESSINGS) ×4 IMPLANT
DURASEAL APPLICATOR TIP (TIP) IMPLANT
DURASEAL SPINE SEALANT 3ML (MISCELLANEOUS) IMPLANT
ELECT CAUTERY BLADE TIP 2.5 (TIP) ×2
ELECT EZSTD 165MM 6.5IN (MISCELLANEOUS) ×2
ELECT REM PT RETURN 9FT ADLT (ELECTROSURGICAL) ×2
ELECTRODE CAUTERY BLDE TIP 2.5 (TIP) ×1 IMPLANT
ELECTRODE EZSTD 165MM 6.5IN (MISCELLANEOUS) ×1 IMPLANT
ELECTRODE REM PT RTRN 9FT ADLT (ELECTROSURGICAL) ×1 IMPLANT
GAUZE 4X4 16PLY RFD (DISPOSABLE) ×2 IMPLANT
GAUZE SPONGE 4X4 12PLY STRL (GAUZE/BANDAGES/DRESSINGS) ×2 IMPLANT
GLOVE BIO SURGEON STRL SZ 6.5 (GLOVE) ×2 IMPLANT
GLOVE BIOGEL PI IND STRL 7.0 (GLOVE) ×2 IMPLANT
GLOVE BIOGEL PI INDICATOR 7.0 (GLOVE) ×2
GLOVE INDICATOR 7.0 STRL GRN (GLOVE) ×2 IMPLANT
GLOVE INDICATOR 8.0 STRL GRN (GLOVE) ×2 IMPLANT
GLOVE SURG SYN 7.0 (GLOVE) ×8 IMPLANT
GLOVE SURG SYN 8.0 (GLOVE) ×4 IMPLANT
GOWN STRL REUS W/ TWL XL LVL3 (GOWN DISPOSABLE) ×3 IMPLANT
GOWN STRL REUS W/TWL XL LVL3 (GOWN DISPOSABLE) ×6
GRADUATE 1200CC STRL 31836 (MISCELLANEOUS) ×2 IMPLANT
KIT TURNOVER KIT A (KITS) ×2 IMPLANT
KIT WILSON FRAME (KITS) ×2 IMPLANT
MANIFOLD NEPTUNE II (INSTRUMENTS) ×2 IMPLANT
MARKER SKIN DUAL TIP RULER LAB (MISCELLANEOUS) ×4 IMPLANT
NDL SAFETY ECLIPSE 18X1.5 (NEEDLE) ×1 IMPLANT
NEEDLE HYPO 18GX1.5 SHARP (NEEDLE) ×2
NEEDLE HYPO 22GX1.5 SAFETY (NEEDLE) ×2 IMPLANT
NS IRRIG 1000ML POUR BTL (IV SOLUTION) ×2 IMPLANT
PACK LAMINECTOMY NEURO (CUSTOM PROCEDURE TRAY) ×2 IMPLANT
PAD ARMBOARD 7.5X6 YLW CONV (MISCELLANEOUS) ×2 IMPLANT
SPOGE SURGIFLO 8M (HEMOSTASIS) ×1
SPONGE SURGIFLO 8M (HEMOSTASIS) ×1 IMPLANT
SUT NURALON 4 0 TR CR/8 (SUTURE) IMPLANT
SUT POLYSORB 2-0 5X18 GS-10 (SUTURE) ×6 IMPLANT
SUT VIC AB 0 CT1 18XCR BRD 8 (SUTURE) ×2 IMPLANT
SUT VIC AB 0 CT1 8-18 (SUTURE) ×4
SYR 20ML LL LF (SYRINGE) ×2 IMPLANT
TOWEL OR 17X26 4PK STRL BLUE (TOWEL DISPOSABLE) ×6 IMPLANT
TRAY FOLEY MTR SLVR 16FR STAT (SET/KITS/TRAYS/PACK) IMPLANT
TUBING CONNECTING 10 (TUBING) ×4 IMPLANT

## 2020-04-20 NOTE — Op Note (Signed)
SURGERY DATE: 04/20/2020  PRE-OP DIAGNOSIS: Lumbar Stenosis with Lumbar Radiculopathy(m48.062)  Neurogenic claudication  POST-OP DIAGNOSIS: Post-Op Diagnosis Codes:  Lumbar Stenosis with Lumbar Radiculopathy (L46.503)  Neurogenic claudication  Procedure(s) with comments:  L3 and L4 laminectomy  SURGEON:  * Malen Gauze, MD  Lonell Face, NP Asst  ANESTHESIA: General   OPERATIVE FINDINGS: Stenosis due to ligament hypertrophy and facet overgrowth from L2-3 to L4-5  OPERATIVE REPORT:  Indication: [name] presented to the clinic on 12/7 with ongoing bilateral leg pain and back pain. He was found on imaging to have stenosis from L2-3 to L4-5. Given previously failed conservative management including  OTC medications, precription medications, steroid injections, we discussed need for decompression at multiple levels to relieve the pressure on the thecal sac and nerves. The risks of surgery were explained to include hematoma, infection, damage to nerve roots, CSF leak, weakness, numbness, pain,  need for future surgery, heart attack, and stroke. He elected to proceed with surgery for symptom relief.   Procedure The patient was brought to the OR after informed consent was obtained. He was given general anesthesia and intubated by the anesthesia service. Vascular access lines were placed.The patient was then placed prone on a Wilson frame ensuring all pressure points were padded. A time-out was performed per protocol.  Fluoroscopy confirmed levels for planned midline incision  The patient was sterilely prepped and draped. The incision was instilled with local anesthetic with epinephrine.  The skin was opened sharply and the dissection taken to the fascia. This was incised and cautery was used to dissect the subperiosteal plane to expose the spinous processes and lamina of L2 to L5. Dissection was continued laterally in order to expose the medial facets at the L3 and L4 levels  bilaterally. Retractors were inserted and hemostasis was achieved. X-ray was used to confirm location at the L3 lamina  Next, a drill was used to remove the entire L3 and L4 lamina centrally.  The caudal half of L2 as well as the rostral part of L5 was also removed. The underlying ligament was freed and removed with combination of rongeurs.. Next attention was turned to the lateral stenosis, starting at the L2-3 level. This continued along the left side until we were at the level of the L5 pedicle. Care was taken not to injure the dura. The right side was treated in a similar fashion.  A curette was used to inspect the lateral recesses ensuring all ligament was removed.  Once the dura appeared free from all the bone edges and all soft tissue compression was removed, hemostasis was obtained.  The muscle was then closed using 0 vicryl followed by the fascia with 0 vicryl.   Next, multiple subcutaneous and dermal layers were closed with 2-0 vicryl until the epidermis was well approximated. The skin was closed with Dermabond. A dressing was applied.  The patient was returned to supine position and extubated by the anesthesia service. The patient was then taken to the PACU for post-operative care where he was moving extremities symmetrically.     ESTIMATED BLOOD LOSS:  100 cc  SPECIMENS  None    I performed the case in its entirety with assistance of NP, christine Zdeb [name], [place]

## 2020-04-20 NOTE — H&P (Signed)
Clayton [name] is an 85 y.o. male.   Chief Complaint: Back and leg pain HPI:  Clayton [name] is here for evaluation of ongoing symptoms in the back and bilateral legs that he says consist of pain going into the hips into the legs but also numbness and tingling into his lower legs and feet. He states this has been going on over the past year but has been worsening. It has gotten to the point where he does not feel like doing much activity as it does increase the symptoms with walking and standing. He has had some recent injections and he felt like the first one did help some with his pain but the second one did not seem to provide any relief. He has not attempted any physical therapy. He has taken etodolac. He does describe some tingling-like sensation in the lower extremities and has some numbness in the leg but most of this is not persistent. He does feel like his legs have become weaker and he does use assistive devices at times. 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 The symptoms have affected this activity. He did have a MRI of the lumbar spine and is here for review of treatment options  He does have a history of a DVT in 2018, currently not on treatment.     Past Medical History:  Diagnosis Date  . Basal cell carcinoma   . Broken ribs    2 BROKEN ON RIBS  . CKD (chronic kidney disease)    PT DENIES  . DVT (deep venous thrombosis) ([facility])    RIGHT LEG  . Fracture dislocation of wrist    RIGHT  . HTN (hypertension)     Past Surgical History:  Procedure Laterality Date  . COLONOSCOPY  2014   cleared for 5 yrs  . COLONOSCOPY N/A 09/01/2018   Procedure: COLONOSCOPY;  Surgeon: Virgel Manifold, MD;  Location: [facility] ENDOSCOPY;  Service: Endoscopy;  Laterality: N/A;  . CRANIECTOMY FOR DEPRESSED SKULL FRACTURE  1960'S  . ESOPHAGOGASTRODUODENOSCOPY N/A 09/01/2018   Procedure: ESOPHAGOGASTRODUODENOSCOPY (EGD);  Surgeon: Virgel Manifold, MD;  Location: [facility] ENDOSCOPY;  Service: Endoscopy;  Laterality: N/A;  . HERNIA REPAIR     X2  . IVC FILTER REMOVAL N/A 07/03/2017   Procedure: IVC FILTER REMOVAL;  Surgeon: Algernon Huxley, MD;  Location: [place] [facility];  Service: Cardiovascular;  Laterality: N/A;  . PERIPHERAL VASCULAR THROMBECTOMY Right 05/22/2017   Procedure: PERIPHERAL VASCULAR THROMBECTOMY;  Surgeon: Algernon Huxley, MD;  Location: [place] [facility];  Service: Cardiovascular;  Laterality: Right;  . TONSILLECTOMY      Family History  Problem Relation Age of Onset  . Heart disease Father    Social History:  reports that he quit smoking about 71 years ago. He has never used smokeless tobacco. He reports current alcohol use of about 7.0 standard drinks of alcohol per week. He reports that he does not use drugs.  Allergies:  Allergies  Allergen Reactions  . Aspirin Rash  . Sulfa Antibiotics Rash    Medications Prior to Admission  Medication Sig Dispense Refill  . acetaminophen (TYLENOL) 500 MG tablet Take 1,000 mg by mouth every 6 (six) hours as needed for mild pain or moderate pain.     . Multiple Vitamins-Minerals (MULTIVITAMIN WITH MINERALS) tablet Take 1 tablet by mouth daily.    . cyclobenzaprine (FLEXERIL) 5 MG tablet Take 1 tablet (5 mg total) by  mouth at bedtime. (Patient taking differently: Take 5 mg by mouth daily as needed for muscle spasms.) 30 tablet 1  . gabapentin (NEURONTIN) 100 MG capsule Take 100 mg by mouth 2 (two) times daily.     . meloxicam (MOBIC) 7.5 MG tablet Take 1 tablet (7.5 mg total) by mouth daily. (Patient not taking: Reported on 04/08/2020) 30 tablet 5  . oxyCODONE-acetaminophen (PERCOCET/ROXICET) 5-325 MG tablet Take 1 tablet by mouth 3 (three) times daily as needed for pain.    . predniSONE (DELTASONE) 10 MG tablet Take 1 tablet (10 mg total) by mouth daily with breakfast. (Patient not taking: Reported on 04/08/2020) 30 tablet 0    No results found for this or any previous visit  (from the past 48 hour(s)). No results found.  Review of Systems General ROS: Negative Psychological ROS: Negative Ophthalmic ROS: Negative ENT ROS: Negative Hematological and Lymphatic ROS: Negative  Endocrine ROS: Negative Respiratory ROS: Negative Cardiovascular ROS: Negative Gastrointestinal ROS: Negative Genito-Urinary ROS: Negative Musculoskeletal ROS: Positive for back pain, hip pain Neurological ROS: Positive for leg pain, numbness Dermatological ROS: Negative   Blood pressure 135/86, pulse 68, temperature (!) 97.2 F (36.2 C), temperature source Temporal, resp. rate 18, height 5\' 9"  (1.753 m), weight 79.4 kg, SpO2 100 %. Physical Exam  General appearance: Alert, cooperative, in no acute distress Head: Normocephalic, previous trauma with healed incisions Eyes: Normal, EOM intact Oropharynx: Wearing facemask CV: Regular rate and rhythm Pulm: Clear to auscultation Back: No tenderness to palpation of the midline, some paramedian tenderness noted. Ext: No edema in LE bilaterally  Neurologic exam:  Mental status: alertness: alert, affect: normal Speech: fluent and clear Motor:strength symmetric 5/5 in bilateral hip flexion, knee flexion, knee extension, dorsiflexion, plantarflexion Sensory: intact to light touch in bilateral lower extremities Reflexes: 2+ and symmetric bilaterally for patella, negative clonus at the ankle Gait: Slow, magnetic gait   Imaging: MRI lumbar spine: There is straightening of lordotic curvature. There is noted degenerative disease throughout which is mild to moderate. There is degenerative disc disease most notable in the upper lumbar spine. There is severe central stenosis noted at L2-3 with a disc protrusion that appears a central to the left and facet and ligament hypertrophy. There is additional moderate central stenosis at L3-4. There is milder stenosis beneath this. There is no obvious listhesis.    Assessment/Plan Proceed with L2-4  Laminectomy  [name], MD 04/20/2020, 9:19 AM

## 2020-04-20 NOTE — OR Nursing (Signed)
Patient ambulated in hall about 100 ft., went to the BR and voided 400 mls without difficulty. Pt. Desires to be discharged to home and wife is in agreement.

## 2020-04-20 NOTE — Transfer of Care (Signed)
Immediate Anesthesia Transfer of Care Note  Patient: Clayton [name]  Procedure(s) Performed: L2/3, L3/4 LAMINECTOMY (N/A )  Patient Location: PACU  Anesthesia Type:General  Level of Consciousness: awake and alert   Airway & Oxygen Therapy: Patient Spontanous Breathing and Patient connected to face mask oxygen  Post-op Assessment: Report given to RN and Post -op Vital signs reviewed and stable  Post vital signs: Reviewed and stable  Last Vitals:  Vitals Value Taken Time  BP 133/69 04/20/20 1230  Temp    Pulse 70 04/20/20 1231  Resp 18 04/20/20 1231  SpO2 100 % 04/20/20 1231  Vitals shown include unvalidated device data.  Last Pain:  Vitals:   04/20/20 0840  TempSrc: Temporal  PainSc: 0-No pain         Complications: No complications documented.

## 2020-04-20 NOTE — Discharge Summary (Signed)
  Procedure: L2-3, L3-4 laminectomy Procedure date: 04/20/2020 Diagnosis: lumbar stenosis    History: [name] is s/p L2-4 laminectomies POD0: Tolerated procedure well. Evaluated in post op recovery still disoriented from anesthesia but able to answer questions and obey commands.   Physical Exam: Vitals:   04/20/20 0902 04/20/20 1230  BP: 135/86 133/69  Pulse: 68 80  Resp:  20  Temp:  97.7 F (36.5 C)  SpO2:  99%    General: Alert and oriented, lying in bed Strength:5/5 throughout  Sensation: intact and symmetric throughout  Skin: incision clean, dry, intact dressing  Data:  Recent Labs  Lab 04/14/20 1212  NA 139  K 4.1  CL 101  CO2 27  BUN 14  CREATININE 1.14  GLUCOSE 100*  CALCIUM 9.4   No results for input(s): AST, ALT, ALKPHOS in the last 168 hours.  Invalid input(s): TBILI   Recent Labs  Lab 04/14/20 1212  WBC 8.9  HGB 15.2  HCT 45.9  PLT 402*   Recent Labs  Lab 04/14/20 1212  APTT 29  INR 1.0         Assessment/Plan:  Clayton [name] is POD0 s/p L2-4 laminectomies. He is cleared for discharge to home once he is able to urinate, ambulate, tolerate PO.  He will follow up with [place] in two weeks in clinic.    Lonell Face, NP Department of Neurosurgery

## 2020-04-20 NOTE — Anesthesia Preprocedure Evaluation (Signed)
Anesthesia Evaluation  Patient identified by MRN, date of birth, ID band Patient awake    Reviewed: Allergy & Precautions, H&P , NPO status , Patient's Chart, lab work & pertinent test results  History of Anesthesia Complications (+) history of anesthetic complications  Airway Mallampati: III  TM Distance: >3 FB Neck ROM: Full    Dental no notable dental hx. (+) Chipped   Pulmonary neg pulmonary ROS, neg sleep apnea, neg COPD, Patient abstained from smoking.Not current smoker, former smoker,    Pulmonary exam normal breath sounds clear to auscultation       Cardiovascular Exercise Tolerance: Good METShypertension, + DVT  (-) CAD and (-) Past MI (-) dysrhythmias  Rhythm:Regular Rate:Normal - Systolic murmurs    Neuro/Psych negative neurological ROS  negative psych ROS   GI/Hepatic negative GI ROS, Neg liver ROS, neg GERD  ,  Endo/Other  negative endocrine ROSneg diabetes  Renal/GU CRFRenal disease  negative genitourinary   Musculoskeletal   Abdominal   Peds  Hematology negative hematology ROS (+)   Anesthesia Other Findings Past Medical History: No date: Basal cell carcinoma No date: CKD (chronic kidney disease) No date: DVT (deep venous thrombosis) (HCC) No date: HTN (hypertension)   Reproductive/Obstetrics negative OB ROS                             Anesthesia Physical  Anesthesia Plan  ASA: II  Anesthesia Plan: General   Post-op Pain Management:    Induction: Intravenous  PONV Risk Score and Plan: 2 and Ondansetron, Dexamethasone and Treatment may vary due to age or medical condition  Airway Management Planned: Oral ETT  Additional Equipment: None  Intra-op Plan:   Post-operative Plan: Extubation in OR  Informed Consent: I have reviewed the patients History and Physical, chart, labs and discussed the procedure including the risks, benefits and alternatives for the  proposed anesthesia with the patient or authorized representative who has indicated his/her understanding and acceptance.     Dental Advisory Given  Plan Discussed with: Anesthesiologist and CRNA  Anesthesia Plan Comments: (Discussed risks of anesthesia with patient, including PONV, sore throat, lip/dental damage. Rare risks discussed as well, such as cardiorespiratory and neurological sequelae. Patient understands.)        Anesthesia Quick Evaluation

## 2020-04-20 NOTE — Discharge Instructions (Addendum)
Please wear your brace when OOB.   Your surgeon has performed an operation on your lumbar spine (low back) to relieve pressure on one or more nerves. Many times, patients feel better immediately after surgery and can "overdo it." Even if you feel well, it is important that you follow these activity guidelines. If you do not let your back heal properly from the surgery, you can increase the chance of a disc herniation and/or return of your symptoms. The following are instructions to help in your recovery once you have been discharged from the hospital.  * Do not take anti-inflammatory medications for 3 days after surgery (naproxen [Aleve], ibuprofen [Advil, Motrin], celecoxib [Celebrex], etc.)  Activity    No bending, lifting, or twisting ("BLT"). Avoid lifting objects heavier than 10 pounds (gallon milk jug).  Where possible, avoid household activities that involve lifting, bending, pushing, or pulling such as laundry, vacuuming, grocery shopping, and childcare. Try to arrange for help from friends and family for these activities while your back heals.  Increase physical activity slowly as tolerated.  Taking short walks is encouraged, but avoid strenuous exercise. Do not jog, run, bicycle, lift weights, or participate in any other exercises unless specifically allowed by your doctor. Avoid prolonged sitting, including car rides.  Talk to your doctor before resuming sexual activity.  You should not drive until cleared by your doctor.  Until released by your doctor, you should not return to work or school.  You should rest at home and let your body heal.   You may shower two days after your surgery.  After showering, lightly dab your incision dry. Do not take a tub bath or go swimming for 3 weeks, or until approved by your doctor at your follow-up appointment.  If you smoke, we strongly recommend that you quit.  Smoking has been proven to interfere with normal healing in your back and will  dramatically reduce the success rate of your surgery. Please contact [facility] (800-QUIT-NOW) and use the resources at www.[facility].com for assistance in stopping smoking.  Surgical Incision   If you have a dressing on your incision, you may remove it three days after your surgery. Keep your incision area clean and dry.  If you have staples or stitches on your incision, you should have a follow up scheduled for removal. If you do not have staples or stitches, you will have steri-strips (small pieces of surgical tape) or Dermabond glue. The steri-strips/glue should begin to peel away within about a week (it is fine if the steri-strips fall off before then). If the strips are still in place one week after your surgery, you may gently remove them.  Diet            You may return to your usual diet. Be sure to stay hydrated.  When to Contact us  Although your surgery and recovery will likely be uneventful, you may have some residual numbness, aches, and pains in your back and/or legs. This is normal and should improve in the next few weeks.  However, should you experience any of the following, contact us immediately: . New numbness or weakness . Pain that is progressively getting worse, and is not relieved by your pain medications or rest . Bleeding, redness, swelling, pain, or drainage from surgical incision . Chills or flu-like symptoms . Fever greater than 101.0 F (38.3 C) . Problems with bowel or bladder functions . Difficulty breathing or shortness of breath . Warmth, tenderness, or swelling in your calf  Contact Information . During office hours (Monday-Friday 9 am to 5 pm), please call your physician at [phone] . After hours and weekends, please call [phone] and an answering service will put you in touch with either Dr. Lacinda Axon or Dr. Izora Ribas.  . For a life-threatening emergency, call [place]   1) The drugs that you were given  will stay in your system until tomorrow so for the next 24 hours you should not:  A) Drive an automobile B) Make any legal decisions C) Drink any alcoholic beverage   2) You may resume regular meals tomorrow.  Today it is better to start with liquids and gradually work up to solid foods.  You may eat anything you prefer, but it is better to start with liquids, then soup and crackers, and gradually work up to solid foods.   3) Please notify your doctor immediately if you have any unusual bleeding, trouble breathing, redness and pain at the surgery site, drainage, fever, or pain not relieved by medication.    4) Additional Instructions:        Please contact your physician with any problems or Same Day Surgery at [phone], Monday through Friday 6 am to 4 pm, or [place] at [facility] number at [phone].

## 2020-04-20 NOTE — Interval H&P Note (Signed)
History and Physical Interval Note:  04/20/2020 9:21 AM  Clayton [name]  has presented today for surgery, with the diagnosis of lumbar stenosis.  The various methods of treatment have been discussed with the patient and family. After consideration of risks, benefits and other options for treatment, the patient has consented to  Procedure(s) with comments: L2/3, L3/4 LAMINECTOMY (N/A) - 2ND CASE as a surgical intervention.  The patient's history has been reviewed, patient examined, no change in status, stable for surgery.  I have reviewed the patient's chart and labs.  Questions were answered to the patient's satisfaction.     [name]

## 2020-04-20 NOTE — Anesthesia Procedure Notes (Signed)
Procedure Name: Intubation Performed by: Demetrius Charity, CRNA Pre-anesthesia Checklist: Patient identified, Patient being monitored, Timeout performed, Emergency Drugs available and Suction available Patient Re-evaluated:Patient Re-evaluated prior to induction Oxygen Delivery Method: Circle system utilized Preoxygenation: Pre-oxygenation with 100% oxygen Induction Type: IV induction Ventilation: Mask ventilation without difficulty Laryngoscope Size: McGraph and 4 Grade View: Grade I Tube type: Oral Tube size: 7.0 mm Number of attempts: 1 Airway Equipment and Method: Stylet and Video-laryngoscopy Placement Confirmation: ETT inserted through vocal cords under direct vision,  positive ETCO2 and breath sounds checked- equal and bilateral Secured at: 24 cm Tube secured with: Tape Dental Injury: Teeth and Oropharynx as per pre-operative assessment

## 2020-04-21 ENCOUNTER — Encounter: Payer: Self-pay | Admitting: Neurosurgery

## 2020-04-21 NOTE — Anesthesia Postprocedure Evaluation (Signed)
Anesthesia Post Note  Patient: Clayton [name]  Procedure(s) Performed: L2/3, L3/4 LAMINECTOMY (N/A )  Patient location during evaluation: PACU Anesthesia Type: General Level of consciousness: awake and alert Pain management: pain level controlled Vital Signs Assessment: post-procedure vital signs reviewed and stable Respiratory status: spontaneous breathing, nonlabored ventilation, respiratory function stable and patient connected to nasal cannula oxygen Cardiovascular status: blood pressure returned to baseline and stable Postop Assessment: no apparent nausea or vomiting Anesthetic complications: no   No complications documented.   Last Vitals:  Vitals:   04/20/20 1347 04/20/20 1430  BP: (!) 172/82 (!) 175/82  Pulse: 67 62  Resp: 16 16  Temp: (!) 35.8 C   SpO2: 98% 99%    Last Pain:  Vitals:   04/21/20 0814  TempSrc:   PainSc: 3                  Arita Miss

## 2020-05-29 ENCOUNTER — Ambulatory Visit: Payer: Medicare Other | Admitting: Family Medicine

## 2020-06-10 DIAGNOSIS — M6281 Muscle weakness (generalized): Secondary | ICD-10-CM | POA: Diagnosis not present

## 2020-06-10 DIAGNOSIS — M545 Low back pain, unspecified: Secondary | ICD-10-CM | POA: Diagnosis not present

## 2020-06-10 DIAGNOSIS — M25551 Pain in right hip: Secondary | ICD-10-CM | POA: Diagnosis not present

## 2020-06-10 DIAGNOSIS — Z9889 Other specified postprocedural states: Secondary | ICD-10-CM | POA: Diagnosis not present

## 2020-06-15 DIAGNOSIS — M6281 Muscle weakness (generalized): Secondary | ICD-10-CM | POA: Diagnosis not present

## 2020-06-15 DIAGNOSIS — M545 Low back pain, unspecified: Secondary | ICD-10-CM | POA: Diagnosis not present

## 2020-06-15 DIAGNOSIS — Z9889 Other specified postprocedural states: Secondary | ICD-10-CM | POA: Diagnosis not present

## 2020-06-15 DIAGNOSIS — M25551 Pain in right hip: Secondary | ICD-10-CM | POA: Diagnosis not present

## 2020-06-18 DIAGNOSIS — Z9889 Other specified postprocedural states: Secondary | ICD-10-CM | POA: Diagnosis not present

## 2020-06-18 DIAGNOSIS — M545 Low back pain, unspecified: Secondary | ICD-10-CM | POA: Diagnosis not present

## 2020-06-18 DIAGNOSIS — M6281 Muscle weakness (generalized): Secondary | ICD-10-CM | POA: Diagnosis not present

## 2020-06-18 DIAGNOSIS — M25551 Pain in right hip: Secondary | ICD-10-CM | POA: Diagnosis not present

## 2020-07-28 DIAGNOSIS — H903 Sensorineural hearing loss, bilateral: Secondary | ICD-10-CM | POA: Diagnosis not present

## 2020-07-28 DIAGNOSIS — H6123 Impacted cerumen, bilateral: Secondary | ICD-10-CM | POA: Diagnosis not present

## 2020-09-08 DIAGNOSIS — H2513 Age-related nuclear cataract, bilateral: Secondary | ICD-10-CM | POA: Diagnosis not present

## 2020-10-26 ENCOUNTER — Ambulatory Visit (INDEPENDENT_AMBULATORY_CARE_PROVIDER_SITE_OTHER): Payer: Medicare Other

## 2020-10-26 VITALS — BP 130/64 | HR 71 | Temp 97.8°F | Resp 16 | Ht 69.0 in | Wt 174.0 lb

## 2020-10-26 DIAGNOSIS — Z0000 Encounter for general adult medical examination without abnormal findings: Secondary | ICD-10-CM | POA: Diagnosis not present

## 2020-10-26 NOTE — Patient Instructions (Signed)
Clayton [name] , Thank you for taking time to come for your Medicare Wellness Visit. I appreciate your ongoing commitment to your health goals. Please review the following plan we discussed and let me know if I can assist you in the future.   Screening recommendations/referrals: Colonoscopy: no longer required Recommended yearly ophthalmology/optometry visit for glaucoma screening and checkup Recommended yearly dental visit for hygiene and checkup  Vaccinations: Influenza vaccine: done 08/12/20 Pneumococcal vaccine: declined Tdap vaccine: done 09/14/16 Shingles vaccine: Shingrix discussed. Please contact your pharmacy for coverage information.  Covid-19:  done 05/09/19, 06/01/19, 01/29/20 & 08/12/20  Advanced directives: Please bring a copy of your health care power of attorney and living will to the office at your convenience.   Conditions/risks identified: recommend increasing physical activity to at least 3 days per week   Next appointment: Follow up in one year for your annual wellness visit.   Preventive Care 51 Years and Older, Male Preventive care refers to lifestyle choices and visits with your health care provider that can promote health and wellness. What does preventive care include? A yearly physical exam. This is also called an annual well check. Dental exams once or twice a year. Routine eye exams. Ask your health care provider how often you should have your eyes checked. Personal lifestyle choices, including: Daily care of your teeth and gums. Regular physical activity. Eating a healthy diet. Avoiding tobacco and drug use. Limiting alcohol use. Practicing safe sex. Taking low doses of aspirin every day. Taking vitamin and mineral supplements as recommended by your health care provider. What happens during an annual well check? The services and screenings done by your health care provider during your annual well check will depend on your age, overall health, lifestyle risk  factors, and family history of disease. Counseling  Your health care provider may ask you questions about your: Alcohol use. Tobacco use. Drug use. Emotional well-being. Home and relationship well-being. Sexual activity. Eating habits. History of falls. Memory and ability to understand (cognition). Work and work Statistician. Screening  You may have the following tests or measurements: Height, weight, and BMI. Blood pressure. Lipid and cholesterol levels. These may be checked every 5 years, or more frequently if you are over 6 years old. Skin check. Lung cancer screening. You may have this screening every year starting at age 40 if you have a 30-pack-year history of smoking and currently smoke or have quit within the past 15 years. Fecal occult blood test (FOBT) of the stool. You may have this test every year starting at age 36. Flexible sigmoidoscopy or colonoscopy. You may have a sigmoidoscopy every 5 years or a colonoscopy every 10 years starting at age 93. Prostate cancer screening. Recommendations will vary depending on your family history and other risks. Hepatitis C blood test. Hepatitis B blood test. Sexually transmitted disease (STD) testing. Diabetes screening. This is done by checking your blood sugar (glucose) after you have not eaten for a while (fasting). You may have this done every 1-3 years. Abdominal aortic aneurysm (AAA) screening. You may need this if you are a current or former smoker. Osteoporosis. You may be screened starting at age 53 if you are at high risk. Talk with your health care provider about your test results, treatment options, and if necessary, the need for more tests. Vaccines  Your health care provider may recommend certain vaccines, such as: Influenza vaccine. This is recommended every year. Tetanus, diphtheria, and acellular pertussis (Tdap, Td) vaccine. You may need a Td  booster every 10 years. Zoster vaccine. You may need this after age  89. Pneumococcal 13-valent conjugate (PCV13) vaccine. One dose is recommended after age 75. Pneumococcal polysaccharide (PPSV23) vaccine. One dose is recommended after age 88. Talk to your health care provider about which screenings and vaccines you need and how often you need them. This information is not intended to replace advice given to you by your health care provider. Make sure you discuss any questions you have with your health care provider. Document Released: 05/15/2015 Document Revised: 01/06/2016 Document Reviewed: 02/17/2015 Elsevier Interactive Patient Education  2017 [place] Prevention in the Home Falls can cause injuries. They can happen to people of all ages. There are many things you can do to make your home safe and to help prevent falls. What can I do on the outside of my home? Regularly fix the edges of walkways and driveways and fix any cracks. Remove anything that might make you trip as you walk through a door, such as a raised step or threshold. Trim any bushes or trees on the path to your home. Use bright outdoor lighting. Clear any walking paths of anything that might make someone trip, such as rocks or tools. Regularly check to see if handrails are loose or broken. Make sure that both sides of any steps have handrails. Any raised decks and porches should have guardrails on the edges. Have any leaves, snow, or ice cleared regularly. Use sand or salt on walking paths during winter. Clean up any spills in your garage right away. This includes oil or grease spills. What can I do in the bathroom? Use night lights. Install grab bars by the toilet and in the tub and shower. Do not use towel bars as grab bars. Use non-skid mats or decals in the tub or shower. If you need to sit down in the shower, use a plastic, non-slip stool. Keep the floor dry. Clean up any water that spills on the floor as soon as it happens. Remove soap buildup in the tub or shower  regularly. Attach bath mats securely with double-sided non-slip rug tape. Do not have throw rugs and other things on the floor that can make you trip. What can I do in the bedroom? Use night lights. Make sure that you have a light by your bed that is easy to reach. Do not use any sheets or blankets that are too big for your bed. They should not hang down onto the floor. Have a firm chair that has side arms. You can use this for support while you get dressed. Do not have throw rugs and other things on the floor that can make you trip. What can I do in the kitchen? Clean up any spills right away. Avoid walking on wet floors. Keep items that you use a lot in easy-to-reach places. If you need to reach something above you, use a strong step stool that has a grab bar. Keep electrical cords out of the way. Do not use floor polish or wax that makes floors slippery. If you must use wax, use non-skid floor wax. Do not have throw rugs and other things on the floor that can make you trip. What can I do with my stairs? Do not leave any items on the stairs. Make sure that there are handrails on both sides of the stairs and use them. Fix handrails that are broken or loose. Make sure that handrails are as long as the stairways. Check any carpeting  to make sure that it is firmly attached to the stairs. Fix any carpet that is loose or worn. Avoid having throw rugs at the top or bottom of the stairs. If you do have throw rugs, attach them to the floor with carpet tape. Make sure that you have a light switch at the top of the stairs and the bottom of the stairs. If you do not have them, ask someone to add them for you. What else can I do to help prevent falls? Wear shoes that: Do not have high heels. Have rubber bottoms. Are comfortable and fit you well. Are closed at the toe. Do not wear sandals. If you use a stepladder: Make sure that it is fully opened. Do not climb a closed stepladder. Make sure that  both sides of the stepladder are locked into place. Ask someone to hold it for you, if possible. Clearly mark and make sure that you can see: Any grab bars or handrails. First and last steps. Where the edge of each step is. Use tools that help you move around (mobility aids) if they are needed. These include: Canes. Walkers. Scooters. Crutches. Turn on the lights when you go into a dark area. Replace any light bulbs as soon as they burn out. Set up your furniture so you have a clear path. Avoid moving your furniture around. If any of your floors are uneven, fix them. If there are any pets around you, be aware of where they are. Review your medicines with your doctor. Some medicines can make you feel dizzy. This can increase your chance of falling. Ask your doctor what other things that you can do to help prevent falls. This information is not intended to replace advice given to you by your health care provider. Make sure you discuss any questions you have with your health care provider. Document Released: 02/12/2009 Document Revised: 09/24/2015 Document Reviewed: 05/23/2014 Elsevier Interactive Patient Education  2017 [employer].

## 2020-10-26 NOTE — Progress Notes (Addendum)
Subjective:   Clayton [name] is a 86 y.o. male who presents for Medicare Annual/Subsequent preventive examination.  Review of Systems     Cardiac Risk Factors include: advanced age (>48men, >76 women);male gender     Objective:    Today's Vitals   10/26/20 0823 10/26/20 0824  BP: 130/64   Pulse: 71   Resp: 16   Temp: 97.8 F (36.6 C)   TempSrc: Oral   SpO2: 98%   Weight: 174 lb (78.9 kg)   Height: 5\' 9"  (1.753 m)   PainSc:  3    Body mass index is 25.7 kg/m.  Advanced Directives 10/26/2020 04/20/2020 04/14/2020 10/23/2019 01/08/2019 08/30/2018 08/30/2018  Does Patient Have a Medical Advance Directive? Yes No Yes No No No No  Type of Paramedic of [place];Living will - [place] in Chart? No - copy requested - Yes - validated most recent copy scanned in chart (See row information) - - - -  Would patient like information on creating a medical advance directive? - No - Patient declined - Yes (MAU/Ambulatory/Procedural Areas - Information given) No - Patient declined No - Patient declined No - Patient declined    Current Medications (verified) Outpatient Encounter Medications as of 10/26/2020  Medication Sig   acetaminophen (TYLENOL) 500 MG tablet Take 1,000 mg by mouth every 6 (six) hours as needed for mild pain or moderate pain.    Multiple Vitamins-Minerals (MULTIVITAMIN WITH MINERALS) tablet Take 1 tablet by mouth daily.   [DISCONTINUED] gabapentin (NEURONTIN) 100 MG capsule Take 100 mg by mouth 2 (two) times daily.    [DISCONTINUED] methocarbamol (ROBAXIN) 500 MG tablet Take 1 tablet (500 mg total) by mouth every 6 (six) hours as needed for muscle spasms.   [DISCONTINUED] oxyCODONE-acetaminophen (PERCOCET/ROXICET) 5-325 MG tablet Take 1 tablet by mouth 3 (three) times daily as needed for pain.   [DISCONTINUED] predniSONE (DELTASONE) 10 MG tablet Take 1 tablet (10 mg total) by mouth  daily with breakfast. (Patient not taking: Reported on 04/08/2020)   No facility-administered encounter medications on file as of 10/26/2020.    Allergies (verified) Aspirin and Sulfa antibiotics   History: Past Medical History:  Diagnosis Date   Basal cell carcinoma    Broken ribs    2 BROKEN ON RIBS   CKD (chronic kidney disease)    PT DENIES   DVT (deep venous thrombosis) (HCC)    RIGHT LEG   Fracture dislocation of wrist    RIGHT   HTN (hypertension)    Past Surgical History:  Procedure Laterality Date   COLONOSCOPY  2014   cleared for 5 yrs   COLONOSCOPY N/A 09/01/2018   Procedure: COLONOSCOPY;  Surgeon: Virgel Manifold, MD;  Location: [facility] ENDOSCOPY;  Service: Endoscopy;  Laterality: N/A;   CRANIECTOMY FOR DEPRESSED SKULL FRACTURE  1960'S   ESOPHAGOGASTRODUODENOSCOPY N/A 09/01/2018   Procedure: ESOPHAGOGASTRODUODENOSCOPY (EGD);  Surgeon: Virgel Manifold, MD;  Location: [facility] ENDOSCOPY;  Service: Endoscopy;  Laterality: N/A;   HERNIA REPAIR     X2   IVC FILTER REMOVAL N/A 07/03/2017   Procedure: IVC FILTER REMOVAL;  Surgeon: Algernon Huxley, MD;  Location: [place] [facility];  Service: Cardiovascular;  Laterality: N/A;   LUMBAR LAMINECTOMY/DECOMPRESSION MICRODISCECTOMY N/A 04/20/2020   Procedure: L2/3, L3/4 LAMINECTOMY;  Surgeon: [name], MD;  Location: [facility] ORS;  Service: Neurosurgery;  Laterality: N/A;  2ND CASE   PERIPHERAL VASCULAR THROMBECTOMY  Right 05/22/2017   Procedure: PERIPHERAL VASCULAR THROMBECTOMY;  Surgeon: Algernon Huxley, MD;  Location: [place] [facility];  Service: Cardiovascular;  Laterality: Right;   TONSILLECTOMY     Family History  Problem Relation Age of Onset   Heart disease Father    Social History   Socioeconomic History   Marital status: Married    Spouse name: Not on file   Number of children: 3   Years of education: Not on file   Highest education level: Not on file  Occupational History   Not on file  Tobacco Use   Smoking  status: Former    Pack years: 0.00    Types: Cigarettes    Quit date: 01/07/1949    Years since quitting: 71.8   Smokeless tobacco: Never   Tobacco comments:    Smoking cessation materials not required  Vaping Use   Vaping Use: Never used  Substance and Sexual Activity   Alcohol use: Yes    Alcohol/week: 7.0 standard drinks    Types: 7 Glasses of wine per week    Comment: once a week   Drug use: No   Sexual activity: Not Currently  Other Topics Concern   Not on file  Social History Narrative   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   Social Determinants of Radio broadcast assistant Strain: Low Risk    Difficulty of Paying Living Expenses: Not hard at all  Food Insecurity: No Food Insecurity   Worried About Charity fundraiser in the Last Year: Never true   Arboriculturist in the Last Year: Never true  Transportation Needs: No Transportation Needs   Lack of Transportation (Medical): No   Lack of Transportation (Non-Medical): No  Physical Activity: Inactive   Days of Exercise per Week: 0 days   Minutes of Exercise per Session: 0 min  Stress: No Stress Concern Present   Feeling of Stress : Not at all  Social Connections: Moderately Integrated   Frequency of Communication with Friends and Family: More than three times a week   Frequency of Social Gatherings with Friends and Family: Once a week   Attends Religious Services: Never   Marine scientist or Organizations: Yes   Attends Music therapist: More than 4 times per year   Marital Status: Married    Tobacco Counseling Counseling given: No Tobacco comments: Smoking cessation materials not required   Clinical Intake:  Pre-visit preparation completed: Yes  Pain : 0-10 Pain Score: 3  Pain Type: Acute pain Pain Location: Hip Pain Orientation: Left Pain Descriptors / Indicators: Aching, Sore Pain Onset: 1 to 4 weeks ago Pain Frequency: Intermittent     BMI - recorded:  25.7 Nutritional Status: BMI 25 -29 Overweight Nutritional Risks: None Diabetes: No  How often do you need to have someone help you when you read instructions, pamphlets, or other written materials from your doctor or pharmacy?: 1 - Never   Interpreter Needed?: No  Information entered by :: Clemetine Marker LPN   Activities of Daily Living In your present state of health, do you have any difficulty performing the following activities: 10/26/2020 04/14/2020  Hearing? Y Y  Comment wears hearing aids HAS SOME DIFFICULTY HEARING BOTH EARS  Vision? N N  Difficulty concentrating or making decisions? Y N  Walking or climbing stairs? N Y  Comment - HAS TO DO IT SLOW AND USE HANDRAIL  Dressing or bathing? N N  Doing errands, shopping? N N  Preparing Food and eating ? N -  Using the Toilet? N -  In the past six months, have you accidently leaked urine? N -  Do you have problems with loss of bowel control? Y -  Comment occasional loose bowels -  Managing your Medications? N -  Managing your Finances? N -  Housekeeping or managing your Housekeeping? N -  Some recent data might be hidden    Patient Care Team: Juline Patch, MD as PCP - General (Family Medicine)  Indicate any recent Medical Services you may have received from other than [facility] providers in the past year (date may be approximate).     Assessment:   This is a routine wellness examination for [employer].  Hearing/Vision screen Hearing Screening - Comments:: Pt wears hearing aids maintained by [facility] Vision Screening - Comments:: Annual vision screen[facility]  Dietary issues and exercise activities discussed: Current Exercise Habits: The patient does not participate in regular exercise at present, Exercise limited by: None identified   Goals Addressed             This Visit's Progress    Increase physical activity       Recommend increasing physical activity to at least 3 days per week          Depression Screen PHQ 2/9 Scores 10/26/2020 01/31/2020 12/30/2019 11/26/2019 10/23/2019 02/20/2019 09/07/2018  PHQ - 2 Score 0 0 0 0 0 0 0  PHQ- 9 Score - 4 0 1 2 - 1    Fall Risk Fall Risk  10/26/2020 01/31/2020 12/30/2019 11/26/2019 10/23/2019  Falls in the past year? 0 0 0 0 1  Number falls in past yr: 0 - - - 1  Injury with Fall? 0 - - - 1  Risk for fall due to : No Fall Risks - - - History of fall(s);Impaired balance/gait  Follow up Falls prevention discussed Falls evaluation completed Falls evaluation completed Falls evaluation completed Falls prevention discussed    FALL RISK PREVENTION PERTAINING TO THE HOME:  Any stairs in or around the home? Yes  If so, are there any without handrails? No  Home free of loose throw rugs in walkways, pet beds, electrical cords, etc? Yes  Adequate lighting in your home to reduce risk of falls? Yes   ASSISTIVE DEVICES UTILIZED TO PREVENT FALLS:  Life alert? No  Use of a cane, walker or w/c? No  Grab bars in the bathroom? No  Shower chair or bench in shower? Yes  Elevated toilet seat or a handicapped toilet? No   TIMED UP AND GO:  Was the test performed? Yes .  Length of time to ambulate 10 feet: 5 sec.   Gait steady and fast without use of assistive device  Cognitive Function:     6CIT Screen 10/26/2020 10/23/2019  What Year? 0 points 0 points  What month? 0 points 0 points  What time? 0 points 0 points  Count back from 20 0 points 0 points  Months in reverse 4 points 4 points  Repeat phrase 6 points 8 points  Total Score 10 12    Immunizations Immunization History  Administered Date(s) Administered   Influenza-Unspecified 08/12/2020   PFIZER Comirnaty(Gray Top)Covid-19 Tri-Sucrose Vaccine 08/12/2020   PFIZER(Purple Top)SARS-COV-2 Vaccination 05/09/2019, 06/01/2019, 01/29/2020   Tdap 09/14/2016    TDAP status: Up to date  Flu Vaccine status: Up to date  Pneumococcal vaccine status: Declined,  Education has  been  provided regarding the importance of this vaccine but patient still declined. Advised may receive this vaccine at local pharmacy or Health Dept. Aware to provide a copy of the vaccination record if obtained from local pharmacy or Health Dept. Verbalized acceptance and understanding.   Covid-19 vaccine status: Completed vaccines  Qualifies for Shingles Vaccine? Yes   Zostavax completed No   Shingrix Completed?: No.    Education has been provided regarding the importance of this vaccine. Patient has been advised to call insurance company to determine out of pocket expense if they have not yet received this vaccine. Advised may also receive vaccine at local pharmacy or Health Dept. Verbalized acceptance and understanding.  Screening Tests Health Maintenance  Topic Date Due   Zoster Vaccines- Shingrix (1 of 2) Never done   PNA vac Low Risk Adult (2 of 2 - PPSV23) 11/25/2020 (Originally 12/14/2016)   INFLUENZA VACCINE  11/30/2020   COVID-19 Vaccine (5 - Booster for Pfizer series) 12/12/2020   TETANUS/TDAP  09/15/2026   HPV VACCINES  Aged Out    Health Maintenance  Health Maintenance Due  Topic Date Due   Zoster Vaccines- Shingrix (1 of 2) Never done    Colorectal cancer screening: No longer required.   Lung Cancer Screening: (Low Dose CT Chest recommended if Age 35-80 years, 30 pack-year currently smoking OR have quit w/in 15years.) does not qualify.    Additional Screening:  Hepatitis C Screening: does not qualify  Vision Screening: Recommended annual ophthalmology exams for early detection of glaucoma and other disorders of the eye. Is the patient up to date with their annual eye exam?  Yes  Who is the provider or what is the name of the office in which the patient attends annual eye exams? [facility].   Dental Screening: Recommended annual dental exams for proper oral hygiene  Community Resource Referral / Chronic Care Management: CRR required this visit?  No   CCM  required this visit?  No      Plan:     I have personally reviewed and noted the following in the patient's chart:   Medical and social history Use of alcohol, tobacco or illicit drugs  Current medications and supplements including opioid prescriptions. Patient is not currently taking opioid prescriptions. Functional ability and status Nutritional status Physical activity Advanced directives List of other physicians Hospitalizations, surgeries, and ER visits in previous 12 months Vitals Screenings to include cognitive, depression, and falls Referrals and appointments  In addition, I have reviewed and discussed with patient certain preventive protocols, quality metrics, and best practice recommendations. A written personalized care plan for preventive services as well as general preventive health recommendations were provided to patient.     Clemetine Marker, LPN   [id number]   Nurse Notes: none

## 2020-11-27 ENCOUNTER — Encounter: Payer: Self-pay | Admitting: Family Medicine

## 2020-11-27 ENCOUNTER — Ambulatory Visit (INDEPENDENT_AMBULATORY_CARE_PROVIDER_SITE_OTHER): Payer: Medicare Other | Admitting: Family Medicine

## 2020-11-27 ENCOUNTER — Other Ambulatory Visit: Payer: Self-pay

## 2020-11-27 VITALS — BP 120/70 | HR 60 | Ht 69.0 in | Wt 175.0 lb

## 2020-11-27 DIAGNOSIS — N183 Chronic kidney disease, stage 3 unspecified: Secondary | ICD-10-CM

## 2020-11-27 DIAGNOSIS — Z8719 Personal history of other diseases of the digestive system: Secondary | ICD-10-CM | POA: Diagnosis not present

## 2020-11-27 DIAGNOSIS — R351 Nocturia: Secondary | ICD-10-CM | POA: Diagnosis not present

## 2020-11-27 DIAGNOSIS — Z0000 Encounter for general adult medical examination without abnormal findings: Secondary | ICD-10-CM | POA: Diagnosis not present

## 2020-11-27 DIAGNOSIS — E7801 Familial hypercholesterolemia: Secondary | ICD-10-CM | POA: Diagnosis not present

## 2020-11-27 DIAGNOSIS — B3749 Other urogenital candidiasis: Secondary | ICD-10-CM

## 2020-11-27 LAB — POCT URINALYSIS DIPSTICK
Bilirubin, UA: NEGATIVE
Blood, UA: NEGATIVE
Glucose, UA: NEGATIVE
Ketones, UA: NEGATIVE
Leukocytes, UA: NEGATIVE
Nitrite, UA: NEGATIVE
Protein, UA: NEGATIVE
Spec Grav, UA: 1.01 (ref 1.010–1.025)
Urobilinogen, UA: 0.2 E.U./dL
pH, UA: 5 (ref 5.0–8.0)

## 2020-11-27 LAB — HEMOCCULT GUIAC POC 1CARD (OFFICE): Fecal Occult Blood, POC: NEGATIVE

## 2020-11-27 MED ORDER — NYSTATIN 100000 UNIT/GM EX CREA: 1.0000 "application " | TOPICAL_CREAM | Freq: Two times a day (BID) | CUTANEOUS | 0 refills | Status: DC

## 2020-11-27 NOTE — Progress Notes (Signed)
Date:  11/27/2020   Name:  Clayton [name]   DOB:  1934/12/04   MRN:  [id number]   Chief Complaint: Annual Exam  Patient is a 86 year old male who presents for a comprehensive physical exam. The patient reports the following problems: none. Health maintenance has been reviewed up to date     Lab Results  Component Value Date   CREATININE 1.14 04/14/2020   BUN 14 04/14/2020   NA 139 04/14/2020   K 4.1 04/14/2020   CL 101 04/14/2020   CO2 27 04/14/2020   Lab Results  Component Value Date   CHOL 205 (H) 11/26/2019   HDL 46 11/26/2019   LDLCALC 140 (H) 11/26/2019   TRIG 105 11/26/2019   CHOLHDL 3.5 12/16/2015   No results found for: TSH No results found for: HGBA1C Lab Results  Component Value Date   WBC 8.9 04/14/2020   HGB 15.2 04/14/2020   HCT 45.9 04/14/2020   MCV 97.0 04/14/2020   PLT 402 (H) 04/14/2020   Lab Results  Component Value Date   ALT 18 11/26/2019   AST 23 11/26/2019   ALKPHOS 62 11/26/2019   BILITOT 0.6 11/26/2019     Review of Systems  Constitutional:  Negative for chills and fever.  HENT:  Negative for drooling, ear discharge, ear pain and sore throat.   Respiratory:  Negative for cough, shortness of breath and wheezing.   Cardiovascular:  Negative for chest pain, palpitations and leg swelling.  Gastrointestinal:  Negative for abdominal pain, blood in stool, constipation, diarrhea and nausea.  Endocrine: Negative for polydipsia.  Genitourinary:  Negative for dysuria, frequency, hematuria and urgency.  Musculoskeletal:  Negative for back pain, myalgias and neck pain.  Skin:  Negative for rash.  Allergic/Immunologic: Negative for environmental allergies.  Neurological:  Negative for dizziness and headaches.  Hematological:  Does not bruise/bleed easily.  Psychiatric/Behavioral:  Negative for suicidal ideas. The patient is not nervous/anxious.    Patient Active Problem List   Diagnosis Date Noted   Acute left-sided low back pain with  left-sided sciatica 02/25/2020   Lumbar stenosis with neurogenic claudication 02/25/2020   Symptomatic anemia 09/03/2018   GI bleed 09/02/2018   Diverticulosis of colon with hemorrhage    Acute gastrointestinal hemorrhage    Hiatal hernia    Stomach irritation    Diverticulosis of small intestine without hemorrhage    HTN (hypertension) 08/30/2018   CKD (chronic kidney disease), stage III ([place]) 08/30/2018   Blood in stool 08/30/2018   Deep vein thrombosis (DVT) of proximal vein of right lower extremity ([place]) 06/14/2017    Allergies  Allergen Reactions   Aspirin Rash   Sulfa Antibiotics Rash    Past Surgical History:  Procedure Laterality Date   COLONOSCOPY  2014   cleared for 5 yrs   COLONOSCOPY N/A 09/01/2018   Procedure: COLONOSCOPY;  Surgeon: Virgel Manifold, MD;  Location: [facility] ENDOSCOPY;  Service: Endoscopy;  Laterality: N/A;   CRANIECTOMY FOR DEPRESSED SKULL FRACTURE  1960'S   ESOPHAGOGASTRODUODENOSCOPY N/A 09/01/2018   Procedure: ESOPHAGOGASTRODUODENOSCOPY (EGD);  Surgeon: Virgel Manifold, MD;  Location: [facility] ENDOSCOPY;  Service: Endoscopy;  Laterality: N/A;   HERNIA REPAIR     X2   IVC FILTER REMOVAL N/A 07/03/2017   Procedure: IVC FILTER REMOVAL;  Surgeon: Algernon Huxley, MD;  Location: [place] [facility];  Service: Cardiovascular;  Laterality: N/A;   LUMBAR LAMINECTOMY/DECOMPRESSION MICRODISCECTOMY N/A 04/20/2020   Procedure: L2/3, L3/4 LAMINECTOMY;  Surgeon: Lacinda Axon,  Remo Lipps, MD;  Location: [facility] ORS;  Service: Neurosurgery;  Laterality: N/A;  2ND CASE   PERIPHERAL VASCULAR THROMBECTOMY Right 05/22/2017   Procedure: PERIPHERAL VASCULAR THROMBECTOMY;  Surgeon: Algernon Huxley, MD;  Location: [place] [facility];  Service: Cardiovascular;  Laterality: Right;   TONSILLECTOMY      Social History   Tobacco Use   Smoking status: Former    Types: Cigarettes    Quit date: 01/07/1949    Years since quitting: 71.9   Smokeless tobacco: Never   Tobacco comments:    Smoking  cessation materials not required  Vaping Use   Vaping Use: Never used  Substance Use Topics   Alcohol use: Yes    Alcohol/week: 7.0 standard drinks    Types: 7 Glasses of wine per week    Comment: once a week   Drug use: No     Medication list has been reviewed and updated.  Current Meds  Medication Sig   acetaminophen (TYLENOL) 500 MG tablet Take 1,000 mg by mouth every 6 (six) hours as needed for mild pain or moderate pain.    Multiple Vitamins-Minerals (MULTIVITAMIN WITH MINERALS) tablet Take 1 tablet by mouth daily.    PHQ 2/9 Scores 11/27/2020 10/26/2020 01/31/2020 12/30/2019  PHQ - 2 Score 0 0 0 0  PHQ- 9 Score 1 - 4 0    GAD 7 : Generalized Anxiety Score 11/27/2020 01/31/2020 12/30/2019 11/26/2019  Nervous, Anxious, on Edge 0 0 0 0  Control/stop worrying 0 0 0 0  Worry too much - different things 0 0 0 0  Trouble relaxing 0 1 0 0  Restless 0 0 0 0  Easily annoyed or irritable 0 0 0 0  Afraid - awful might happen 0 0 0 0  Total GAD 7 Score 0 1 0 0  Anxiety Difficulty - Not difficult at all - -    BP Readings from Last 3 Encounters:  11/27/20 120/70  10/26/20 130/64  04/20/20 (!) 175/82    Physical Exam Vitals and nursing note reviewed.  Constitutional:      Appearance: Normal appearance. He is normal weight.  HENT:     Head: Normocephalic.     Jaw: There is normal jaw occlusion.     Right Ear: Hearing, tympanic membrane, ear canal and external ear normal. There is no impacted cerumen.     Left Ear: Hearing, tympanic membrane, ear canal and external ear normal. There is no impacted cerumen.     Nose: Nose normal. No congestion or rhinorrhea.     Right Turbinates: Not swollen.     Left Turbinates: Not swollen.     Mouth/Throat:     Lips: Pink.     Mouth: Mucous membranes are moist.     Pharynx: Oropharynx is clear. Uvula midline.  Eyes:     General: Lids are normal. Vision grossly intact. Gaze aligned appropriately. No scleral icterus.       Right eye: No  discharge.        Left eye: No discharge.     Extraocular Movements: Extraocular movements intact.     Conjunctiva/sclera: Conjunctivae normal.     Pupils: Pupils are equal, round, and reactive to light.  Neck:     Thyroid: No thyroid mass, thyromegaly or thyroid tenderness.     Vascular: No JVD.     Trachea: No tracheal deviation.  Cardiovascular:     Rate and Rhythm: Normal rate and regular rhythm.     Pulses: Normal pulses.  Carotid pulses are 2+ on the right side and 2+ on the left side.      Radial pulses are 2+ on the right side and 2+ on the left side.       Femoral pulses are 2+ on the right side and 2+ on the left side.      Popliteal pulses are 2+ on the right side and 2+ on the left side.       Dorsalis pedis pulses are 2+ on the right side and 2+ on the left side.       Posterior tibial pulses are 2+ on the right side and 2+ on the left side.     Heart sounds: Normal heart sounds, S1 normal and S2 normal. No murmur heard. No systolic murmur is present.  No diastolic murmur is present.    No friction rub. No gallop. No S3 or S4 sounds.  Pulmonary:     Effort: Pulmonary effort is normal. No respiratory distress.     Breath sounds: Normal breath sounds. No decreased breath sounds, wheezing, rhonchi or rales.  Chest:  Breasts:    Right: Normal. No mass, axillary adenopathy or supraclavicular adenopathy.     Left: Normal. No mass, axillary adenopathy or supraclavicular adenopathy.  Abdominal:     General: Bowel sounds are normal.     Palpations: Abdomen is soft. There is no hepatomegaly, splenomegaly or mass.     Tenderness: There is no abdominal tenderness. There is no guarding or rebound.     Hernia: No hernia is present. There is no hernia in the umbilical area, ventral area, left inguinal area or right inguinal area.  Genitourinary:    Penis: Normal.      Testes: Normal.        Right: Mass not present.        Left: Mass not present.  Musculoskeletal:         General: No tenderness. Normal range of motion.     Cervical back: Full passive range of motion without pain, normal range of motion and neck supple.     Right lower leg: No edema.     Left lower leg: No edema.  Lymphadenopathy:     Head:     Right side of head: No submandibular or tonsillar adenopathy.     Left side of head: No submandibular or tonsillar adenopathy.     Cervical: No cervical adenopathy.     Right cervical: No superficial, deep or posterior cervical adenopathy.    Left cervical: No superficial, deep or posterior cervical adenopathy.     Upper Body:     Right upper body: No supraclavicular or axillary adenopathy.     Left upper body: No supraclavicular or axillary adenopathy.     Lower Body: No right inguinal adenopathy. No left inguinal adenopathy.  Skin:    General: Skin is warm.     Capillary Refill: Capillary refill takes less than 2 seconds.     Findings: No rash.  Neurological:     Mental Status: He is alert and oriented to person, place, and time.     Cranial Nerves: Cranial nerves are intact. No cranial nerve deficit.     Sensory: Sensation is intact.     Motor: Motor function is intact.     Deep Tendon Reflexes: Reflexes are normal and symmetric.     Reflex Scores:      Tricep reflexes are 2+ on the right side and 2+ on the left side.  Bicep reflexes are 2+ on the right side and 2+ on the left side.      Brachioradialis reflexes are 2+ on the right side and 2+ on the left side.      Patellar reflexes are 2+ on the right side and 2+ on the left side.      Achilles reflexes are 2+ on the right side and 2+ on the left side. Psychiatric:        Behavior: Behavior is cooperative.    Wt Readings from Last 3 Encounters:  11/27/20 175 lb (79.4 kg)  10/26/20 174 lb (78.9 kg)  04/20/20 175 lb (79.4 kg)    BP 120/70   Pulse 60   Ht '5\' 9"'$  (1.753 m)   Wt 175 lb (79.4 kg)   BMI 25.84 kg/m   Assessment and Plan:  Clayton [name] is a 86 y.o. male  who presents today for his Complete Annual Exam. He feels well. He reports exercising as able. He reports he is sleeping well.  Patient's chart was reviewed for previous encounters most recent labs and care everywhere. 1. Annual physical exam Immunizations are reviewed and recommendations provided.   Age appropriate screening tests are discussed. Counseling given for risk factor reduction interventions.  No subjective/objective concerns noted during the history of present illness, review of systems, nor physical exam.  Of note though was noticed patient had cold digits of the distal lower extremities and pulses were noted to be decreased to 1+.  Labs that were obtained today include CBC, renal function panel, PSA, and lipid panel. - CBC with Differential/Platelet - Renal Function Panel - PSA - Lipid Panel With LDL/HDL Ratio - Ambulatory referral to Vascular Surgery - POCT urinalysis dipstick  2. Stage 3 chronic kidney disease, unspecified whether stage 3a or 3b CKD ([place]) Patient with history of stage III chronic kidney disease we will check renal function panel for current status. - Renal Function Panel  3. Familial hypercholesterolemia Patient with history of an abnormal cholesterol and we will check current lipid panel. - Lipid Panel With LDL/HDL Ratio  4. History of GI bleed Chronic.  Controlled.  Patient is no longer having upper GI bleed as noted previous encounters.  We will obtain a CBC for current hematocrit hemoglobin and occult blood was noted to be negative on exam. - POCT Occult Blood Stool  5. Nocturia Patient has occasional nocturia and we will check a PSA - PSA - POCT urinalysis dipstick  6. Candidiasis of genitalia On physical exam as noted above.  Patient has erythema in the folds of the inguinal area consistent with candidiasis and we will treat with nystatin cream. - nystatin cream (MYCOSTATIN); Apply 1 application topically 2 (two) times daily.  Dispense: 30 g;  Refill: 0

## 2020-11-28 LAB — CBC WITH DIFFERENTIAL/PLATELET
Basophils Absolute: 0.1 10*3/uL (ref 0.0–0.2)
Basos: 1 %
EOS (ABSOLUTE): 0 10*3/uL (ref 0.0–0.4)
Eos: 1 %
Hematocrit: 47.6 % (ref 37.5–51.0)
Hemoglobin: 15.6 g/dL (ref 13.0–17.7)
Immature Grans (Abs): 0 10*3/uL (ref 0.0–0.1)
Immature Granulocytes: 0 %
Lymphocytes Absolute: 1.8 10*3/uL (ref 0.7–3.1)
Lymphs: 22 %
MCH: 31.8 pg (ref 26.6–33.0)
MCHC: 32.8 g/dL (ref 31.5–35.7)
MCV: 97 fL (ref 79–97)
Monocytes Absolute: 0.9 10*3/uL (ref 0.1–0.9)
Monocytes: 11 %
Neutrophils Absolute: 5.5 10*3/uL (ref 1.4–7.0)
Neutrophils: 65 %
Platelets: 339 10*3/uL (ref 150–450)
RBC: 4.91 x10E6/uL (ref 4.14–5.80)
RDW: 12.7 % (ref 11.6–15.4)
WBC: 8.3 10*3/uL (ref 3.4–10.8)

## 2020-11-28 LAB — LIPID PANEL WITH LDL/HDL RATIO
Cholesterol, Total: 212 mg/dL — ABNORMAL HIGH (ref 100–199)
HDL: 56 mg/dL (ref >39)
LDL Chol Calc (NIH): 138 mg/dL — ABNORMAL HIGH (ref 0–99)
LDL/HDL Ratio: 2.5 ratio (ref 0.0–3.6)
Triglycerides: 99 mg/dL (ref 0–149)
VLDL Cholesterol Cal: 18 mg/dL (ref 5–40)

## 2020-11-28 LAB — PSA: Prostate Specific Ag, Serum: 1.8 ng/mL (ref 0.0–4.0)

## 2020-11-28 LAB — RENAL FUNCTION PANEL
Albumin: 4.9 g/dL — ABNORMAL HIGH (ref 3.6–4.6)
BUN/Creatinine Ratio: 12 (ref 10–24)
BUN: 15 mg/dL (ref 8–27)
CO2: 20 mmol/L (ref 20–29)
Calcium: 9.4 mg/dL (ref 8.6–10.2)
Chloride: 102 mmol/L (ref 96–106)
Creatinine, Ser: 1.23 mg/dL (ref 0.76–1.27)
Glucose: 100 mg/dL — ABNORMAL HIGH (ref 65–99)
Phosphorus: 3.1 mg/dL (ref 2.8–4.1)
Potassium: 5 mmol/L (ref 3.5–5.2)
Sodium: 141 mmol/L (ref 134–144)
eGFR: 58 mL/min/{1.73_m2} — ABNORMAL LOW (ref >59)

## 2021-05-20 IMAGING — MR MR LUMBAR SPINE W/O CM
5 series · 33 of 48 positions shown · non-contrast
Comparison: Radiography 08/17/2018

CLINICAL DATA: And left hip pain for 10 weeks

EXAM:
MRI LUMBAR SPINE WITHOUT CONTRAST
TECHNIQUE: Multiplanar, multisequence MR imaging of the lumbar spine was
performed. No intravenous contrast was administered.

[Series 5: T2 · sagittal · 4.0mm · 1.02mm/px · 6 of 16 slices shown (1 of 2)]
[im 1/16]
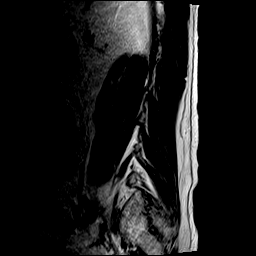
[im 4/16]
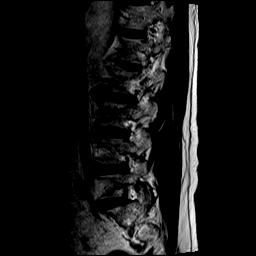
[im 7/16]
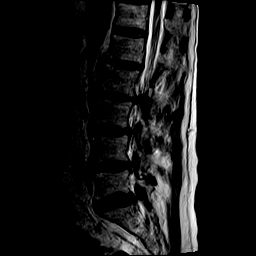
[im 10/16]
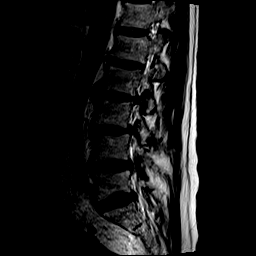
[im 13/16]
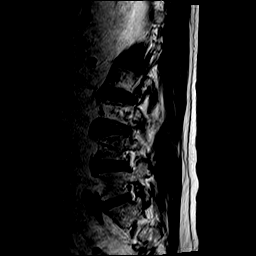
[im 16/16]
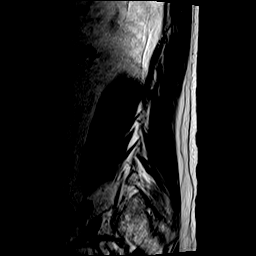

[Series 6: T1 · sagittal · 4.0mm · 1.02mm/px · 6 of 15 slices shown (1 of 2)]
[im 1/15]
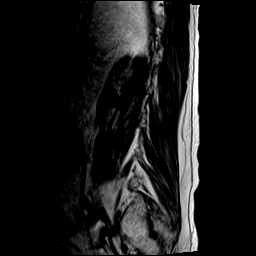
[im 3/15]
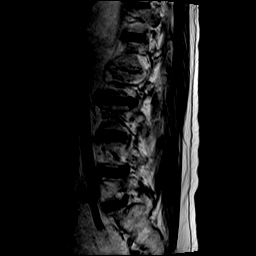
[im 6/15]
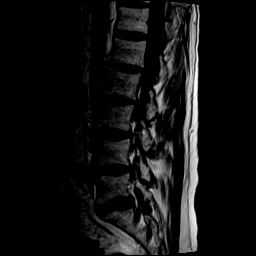
[im 9/15]
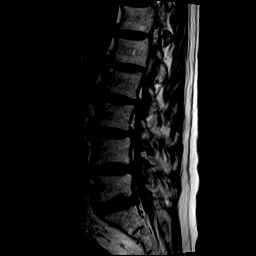
[im 12/15]
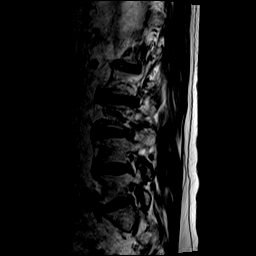
[im 15/15]
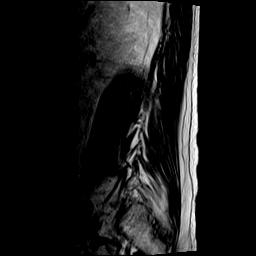

[Series 11: STIR · sagittal · 4.0mm · 0.51mm/px · 3 of 15 slices shown]
[im 1/15]
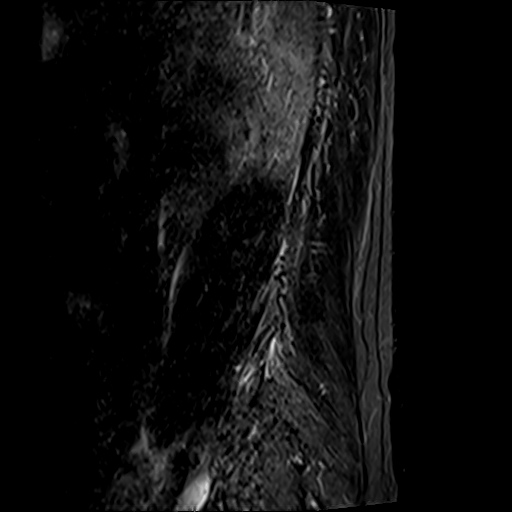
[im 3/15]
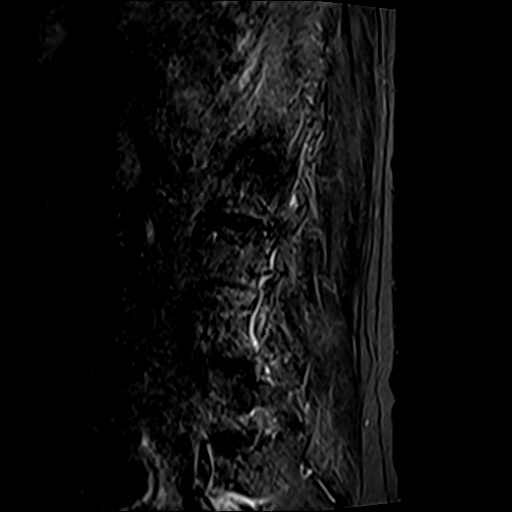
[im 6/15]
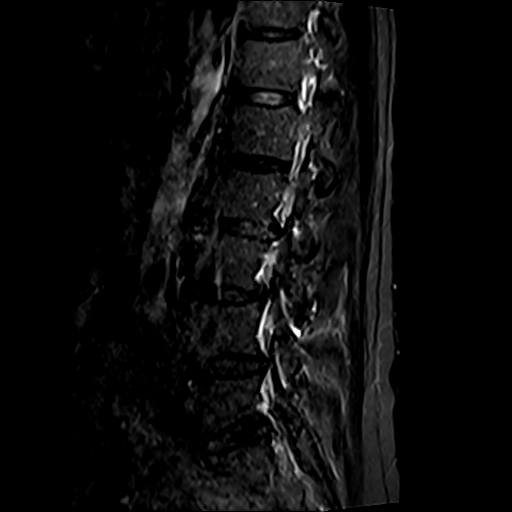

[Series 12: T2 · axial · 4.0mm · 0.78mm/px · z∈[-130,+84]mm · 9 of 36 slices shown (2 of 2)]
[im 1/36]
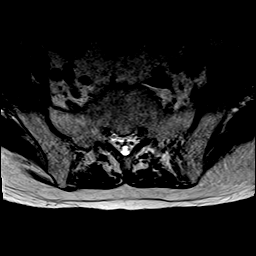
[im 6/36]
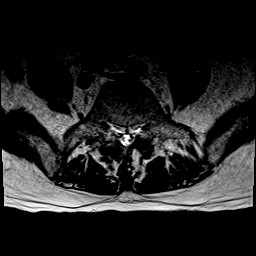
[im 11/36]
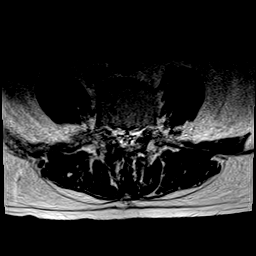
[im 16/36]
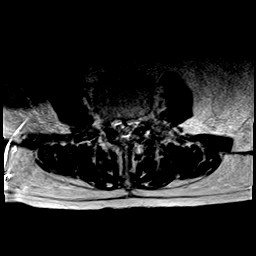
[im 18/36]
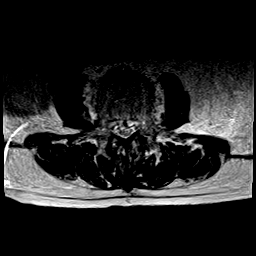
[im 21/36]
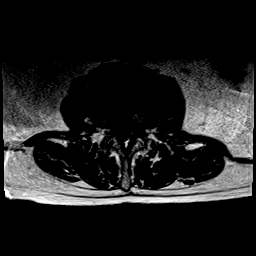
[im 26/36]
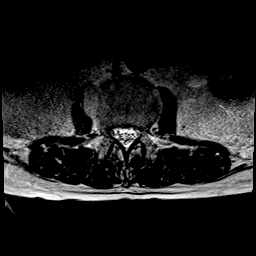
[im 31/36]
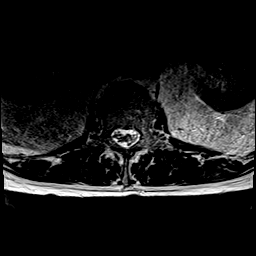
[im 36/36]
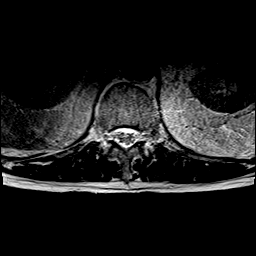

[Series 13: T1 · axial · 4.0mm · 0.39mm/px · z∈[-130,+84]mm · 9 of 36 slices shown (2 of 2)]
[im 1/36]
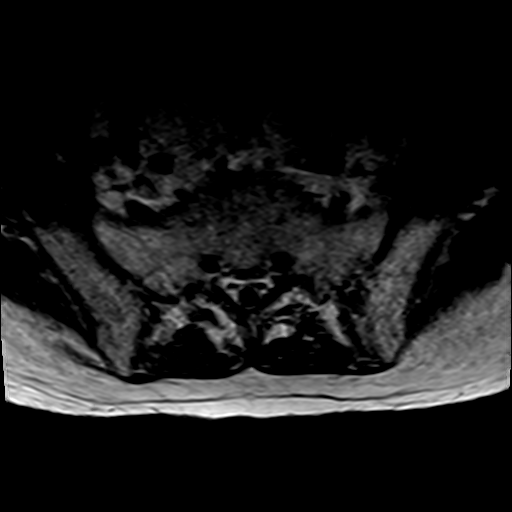
[im 6/36]
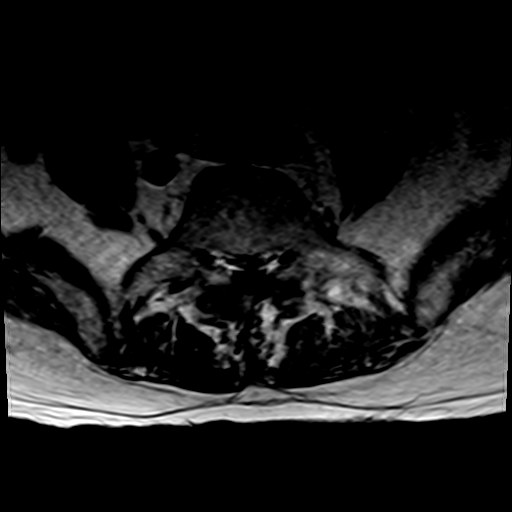
[im 11/36]
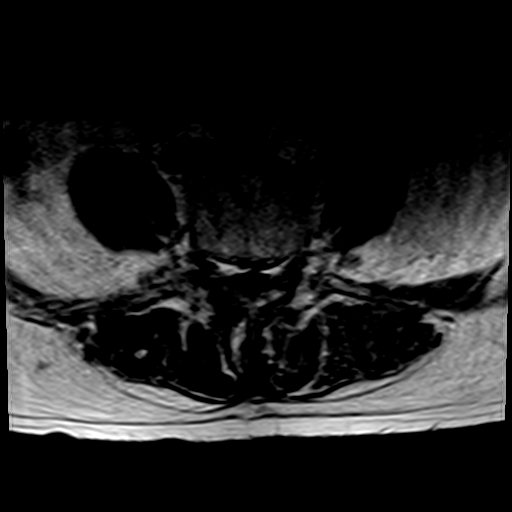
[im 16/36]
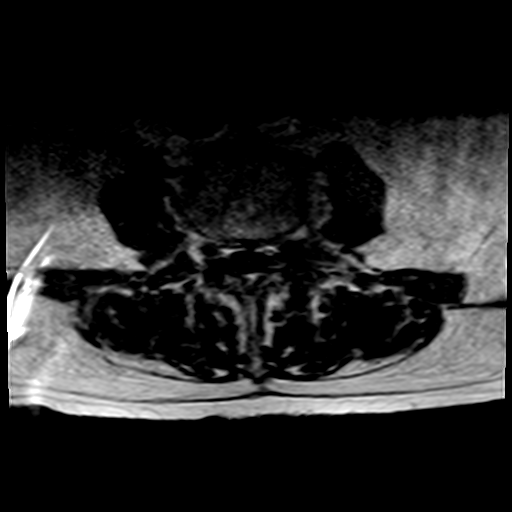
[im 18/36]
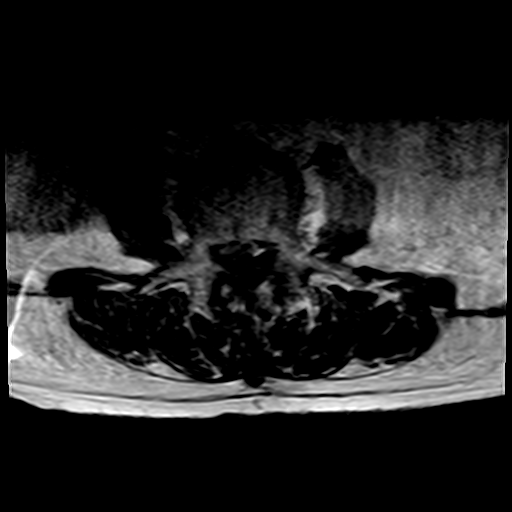
[im 21/36]
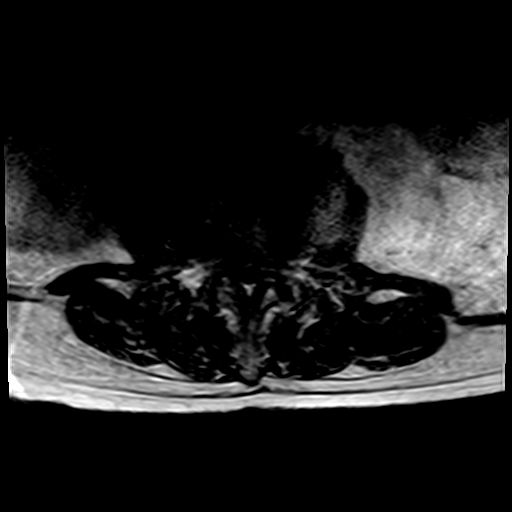
[im 26/36]
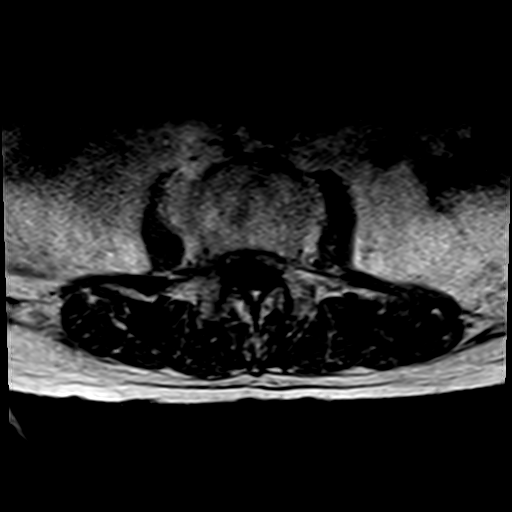
[im 31/36]
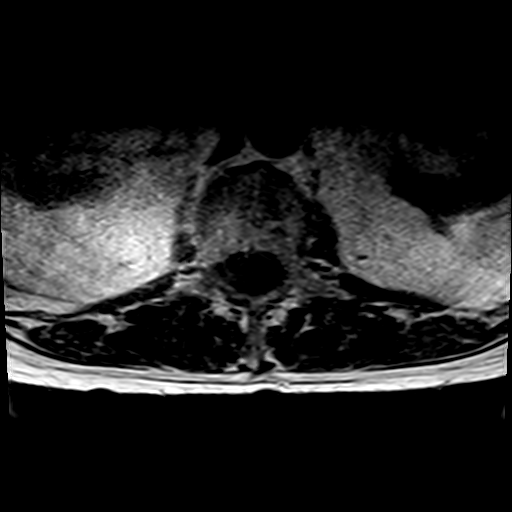
[im 36/36]
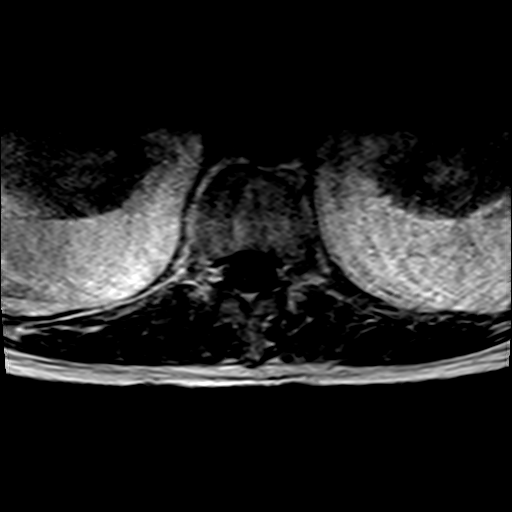

[33 of 48 positions shown; findings below may reference images not displayed]

FINDINGS: Segmentation:  5 lumbar type vertebrae

Alignment: Borderline degenerative anterolisthesis at L3-4 and L4-5.

Vertebrae:  No fracture, evidence of discitis, or bone lesion.

Conus medullaris and cauda equina: Conus extends to the L1-2 level.
Conus and cauda equina appear normal.

Paraspinal and other soft tissues: No perispinal mass or
inflammation seen. Sigmoid diverticulosis with a large outpouching,
favor giant diverticulum measuring 4 cm but partially covered.

Disc levels:

T12- L1: Unremarkable.

L1-L2: Narrowing of the disc with mild ventral spurring. No
impingement

L2-L3: Disc narrowing with large extrusion compressing the thecal
sac. Borderline facet spurring. Left foraminal protrusion with
moderate narrowing of the foramen.

L3-L4: Facet osteoarthritis with spurring and ligamentum flavum
thickening. The disc is bulging eccentric to the left where there is
a foraminal protrusion. Advanced spinal stenosis with no visible
CSF. Moderate left foraminal impingement

L4-L5: Disc narrowing and bulging with degenerative facet spurring.
High-grade thecal sac narrowing on axial slices, more moderate on
sagittal images. Patent foramina

L5-S1:Facet osteoarthritis with mild to moderate spurring. Mild disc
bulging with small central protrusion. No neural compression.
IMPRESSION: 1. Degenerative disease superimposed on congenitally narrow spinal
canal from short pedicles.
2. L2-3 large disc extrusion with advanced spinal stenosis.
3. L3-4 and L4-5 high-grade degenerative spinal stenosis.
4. L2-3 and L3-4 left foraminal protrusions and moderate stenosis.
5. Motion degraded.

## 2021-07-02 ENCOUNTER — Ambulatory Visit: Payer: Medicare PPO | Admitting: Family Medicine

## 2021-07-02 ENCOUNTER — Other Ambulatory Visit: Payer: Self-pay

## 2021-07-02 ENCOUNTER — Encounter: Payer: Self-pay | Admitting: Family Medicine

## 2021-07-02 VITALS — BP 120/78 | HR 76 | Ht 69.0 in | Wt 181.0 lb

## 2021-07-02 DIAGNOSIS — M533 Sacrococcygeal disorders, not elsewhere classified: Secondary | ICD-10-CM

## 2021-07-02 DIAGNOSIS — M5136 Other intervertebral disc degeneration, lumbar region: Secondary | ICD-10-CM | POA: Diagnosis not present

## 2021-07-02 MED ORDER — PREDNISONE 10 MG PO TABS: 10.0000 mg | ORAL_TABLET | Freq: Every day | ORAL | 0 refills | Status: DC

## 2021-07-02 NOTE — Progress Notes (Signed)
Date:  07/02/2021   Name:  Clayton [name]   DOB:  Nov 13, 1934   MRN:  [id number]   Chief Complaint: Hip Pain (L) hip hurts at night when trying to sleep.No trauma that knows of)  Hip Pain  The incident occurred more than 1 week ago (pain worse couple months./left iliac crest/paraspinal muscles). There was no injury mechanism. The quality of the pain is described as aching. The pain is at a severity of 6/10 (worse at night). The pain is moderate. Pain course: intermitant. Pertinent negatives include no inability to bear weight, loss of motion, loss of sensation, muscle weakness, numbness or tingling.  Back Pain This is a recurrent problem. The current episode started more than 1 month ago. The problem occurs intermittently. The problem has been waxing and waning since onset. The pain is present in the lumbar spine. Pertinent negatives include no abdominal pain, chest pain, dysuria, fever, headaches, numbness or tingling.   Lab Results  Component Value Date   NA 141 11/27/2020   K 5.0 11/27/2020   CO2 20 11/27/2020   GLUCOSE 100 (H) 11/27/2020   BUN 15 11/27/2020   CREATININE 1.23 11/27/2020   CALCIUM 9.4 11/27/2020   EGFR 58 (L) 11/27/2020   GFRNONAA >60 04/14/2020   Lab Results  Component Value Date   CHOL 212 (H) 11/27/2020   HDL 56 11/27/2020   LDLCALC 138 (H) 11/27/2020   TRIG 99 11/27/2020   CHOLHDL 3.5 12/16/2015   No results found for: TSH No results found for: HGBA1C Lab Results  Component Value Date   WBC 8.3 11/27/2020   HGB 15.6 11/27/2020   HCT 47.6 11/27/2020   MCV 97 11/27/2020   PLT 339 11/27/2020   Lab Results  Component Value Date   ALT 18 11/26/2019   AST 23 11/26/2019   ALKPHOS 62 11/26/2019   BILITOT 0.6 11/26/2019   No results found for: 25OHVITD2, 25OHVITD3, VD25OH   Review of Systems  Constitutional:  Negative for chills and fever.  HENT:  Negative for drooling, ear discharge, ear pain and sore throat.   Respiratory:  Negative for  cough, shortness of breath and wheezing.   Cardiovascular:  Negative for chest pain, palpitations and leg swelling.  Gastrointestinal:  Negative for abdominal pain, blood in stool, constipation, diarrhea and nausea.  Endocrine: Negative for polydipsia.  Genitourinary:  Negative for dysuria, frequency, hematuria and urgency.  Musculoskeletal:  Positive for back pain. Negative for myalgias and neck pain.  Skin:  Negative for rash.  Allergic/Immunologic: Negative for environmental allergies.  Neurological:  Negative for dizziness, tingling, numbness and headaches.  Hematological:  Does not bruise/bleed easily.  Psychiatric/Behavioral:  Negative for suicidal ideas. The patient is not nervous/anxious.    Patient Active Problem List   Diagnosis Date Noted   Acute left-sided low back pain with left-sided sciatica 02/25/2020   Lumbar stenosis with neurogenic claudication 02/25/2020   Symptomatic anemia 09/03/2018   GI bleed 09/02/2018   Diverticulosis of colon with hemorrhage    Acute gastrointestinal hemorrhage    Hiatal hernia    Stomach irritation    Diverticulosis of small intestine without hemorrhage    HTN (hypertension) 08/30/2018   Blood in stool 08/30/2018   Deep vein thrombosis (DVT) of proximal vein of right lower extremity ([place]) 06/14/2017    Allergies  Allergen Reactions   Aspirin Rash   Sulfa Antibiotics Rash    Past Surgical History:  Procedure Laterality Date   COLONOSCOPY  2014  cleared for 5 yrs   COLONOSCOPY N/A 09/01/2018   Procedure: COLONOSCOPY;  Surgeon: Virgel Manifold, MD;  Location: [facility] ENDOSCOPY;  Service: Endoscopy;  Laterality: N/A;   CRANIECTOMY FOR DEPRESSED SKULL FRACTURE  1960'S   ESOPHAGOGASTRODUODENOSCOPY N/A 09/01/2018   Procedure: ESOPHAGOGASTRODUODENOSCOPY (EGD);  Surgeon: Virgel Manifold, MD;  Location: [facility] ENDOSCOPY;  Service: Endoscopy;  Laterality: N/A;   HERNIA REPAIR     X2   IVC FILTER REMOVAL N/A 07/03/2017   Procedure: IVC  FILTER REMOVAL;  Surgeon: Algernon Huxley, MD;  Location: [facility];  Service: Cardiovascular;  Laterality: N/A;   LUMBAR LAMINECTOMY/DECOMPRESSION MICRODISCECTOMY N/A 04/20/2020   Procedure: L2/3, L3/4 LAMINECTOMY;  Surgeon: [name], MD;  Location: [facility] ORS;  Service: Neurosurgery;  Laterality: N/A;  2ND CASE   PERIPHERAL VASCULAR THROMBECTOMY Right 05/22/2017   Procedure: PERIPHERAL VASCULAR THROMBECTOMY;  Surgeon: Algernon Huxley, MD;  Location: [facility];  Service: Cardiovascular;  Laterality: Right;   TONSILLECTOMY      Social History   Tobacco Use   Smoking status: Former    Types: Cigarettes    Quit date: 01/07/1949    Years since quitting: 72.5   Smokeless tobacco: Never   Tobacco comments:    Smoking cessation materials not required  Vaping Use   Vaping Use: Never used  Substance Use Topics   Alcohol use: Yes    Alcohol/week: 7.0 standard drinks    Types: 7 Glasses of wine per week    Comment: once a week   Drug use: No     Medication list has been reviewed and updated.  Current Meds  Medication Sig   acetaminophen (TYLENOL) 500 MG tablet Take 1,000 mg by mouth every 6 (six) hours as needed for mild pain or moderate pain.     PHQ 2/9 Scores 07/02/2021 11/27/2020 10/26/2020 01/31/2020  PHQ - 2 Score 0 0 0 0  PHQ- 9 Score 2 1 - 4    GAD 7 : Generalized Anxiety Score 07/02/2021 11/27/2020 01/31/2020 12/30/2019  Nervous, Anxious, on Edge 0 0 0 0  Control/stop worrying 0 0 0 0  Worry too much - different things 0 0 0 0  Trouble relaxing 0 0 1 0  Restless 0 0 0 0  Easily annoyed or irritable 0 0 0 0  Afraid - awful might happen 0 0 0 0  Total GAD 7 Score 0 0 1 0  Anxiety Difficulty Not difficult at all - Not difficult at all -    BP Readings from Last 3 Encounters:  07/02/21 120/78  11/27/20 120/70  10/26/20 130/64    Physical Exam Vitals and nursing note reviewed.  HENT:     Head: Normocephalic.     Right Ear: Tympanic membrane and external  ear normal.     Left Ear: Tympanic membrane and external ear normal.     Nose: Nose normal.  Eyes:     General: No scleral icterus.       Right eye: No discharge.        Left eye: No discharge.     Conjunctiva/sclera: Conjunctivae normal.     Pupils: Pupils are equal, round, and reactive to light.  Neck:     Thyroid: No thyromegaly.     Vascular: No JVD.     Trachea: No tracheal deviation.  Cardiovascular:     Rate and Rhythm: Normal rate and regular rhythm.     Heart sounds: Normal heart sounds. No murmur heard.  No friction rub. No gallop.  Pulmonary:     Effort: No respiratory distress.     Breath sounds: Normal breath sounds. No wheezing or rales.  Abdominal:     General: Bowel sounds are normal.     Palpations: Abdomen is soft. There is no mass.     Tenderness: There is no abdominal tenderness. There is no guarding or rebound.  Musculoskeletal:        General: Normal range of motion.     Cervical back: Normal range of motion and neck supple.     Lumbar back: Spasms and tenderness present. Negative left straight leg raise test.     Right hip: No bony tenderness. Normal range of motion.     Left hip: No bony tenderness. Normal range of motion.       Legs:     Comments: Tender along left SI joint/no hip pain with internal/external  Lymphadenopathy:     Cervical: No cervical adenopathy.  Skin:    General: Skin is warm.     Findings: No rash.  Neurological:     Mental Status: He is alert and oriented to person, place, and time.     Cranial Nerves: No cranial nerve deficit.     Deep Tendon Reflexes: Reflexes are normal and symmetric.    Wt Readings from Last 3 Encounters:  07/02/21 181 lb (82.1 kg)  11/27/20 175 lb (79.4 kg)  10/26/20 174 lb (78.9 kg)    BP 120/78    Pulse 76    Ht _0  (1.753 m)    Wt 181 lb (82.1 kg)    BMI 26.73 kg/m   Assessment and Plan:  1. Sacroiliac pain Chronic.  Controlled.  Stable.  This is behaving similar to circumstances in 2022.   Patient ultimately had surgery on L2-L3-L4 for L4-5 I believe.  This may be some reexacerbation.  Patient has had a prednisone taper in the past and we will do so and we will refer to [facility] Monday to reevaluate with post prednisone.  Mr. Corky Sox does have tenderness over the left SI joint and since this is a positional pain that is usually when he goes to bed at night and how he has light that he has pain beginning to think that this may be involved as well for this reason the prednisone is going to be used and this will be reevaluated by [facility] Monday. - predniSONE (DELTASONE) 10 MG tablet; Take 1 tablet (10 mg total) by mouth daily with breakfast. Taper 4,4,4,3,3,3,2,2,2,1,1,1,  Dispense: 30 tablet; Refill: 0 - Ambulatory referral to Orthopedic Surgery  2. Degenerative disc disease, lumbar As noted above patient is a post surgery of Dr. Lacinda Axon and has been followed by neurosurgery at [facility] clinic with procedure in 2022. - predniSONE (DELTASONE) 10 MG tablet; Take 1 tablet (10 mg total) by mouth daily with breakfast. Taper 4,4,4,3,3,3,2,2,2,1,1,1,  Dispense: 30 tablet; Refill: 0 - Ambulatory referral to Orthopedic Surgery

## 2021-07-15 ENCOUNTER — Other Ambulatory Visit: Payer: Self-pay | Admitting: Orthopedic Surgery

## 2021-07-15 ENCOUNTER — Other Ambulatory Visit (HOSPITAL_COMMUNITY): Payer: Self-pay | Admitting: Orthopedic Surgery

## 2021-07-26 ENCOUNTER — Ambulatory Visit
Admission: RE | Admit: 2021-07-26 | Discharge: 2021-07-26 | Disposition: A | Discharge status: Home / Self Care | Payer: Medicare PPO | Source: Ambulatory Visit | Attending: Orthopedic Surgery | Admitting: Orthopedic Surgery

## 2021-07-26 DIAGNOSIS — M545 Low back pain, unspecified: Secondary | ICD-10-CM | POA: Insufficient documentation

## 2021-07-26 DIAGNOSIS — M4807 Spinal stenosis, lumbosacral region: Secondary | ICD-10-CM | POA: Diagnosis present

## 2021-09-03 DIAGNOSIS — M48062 Spinal stenosis, lumbar region with neurogenic claudication: Secondary | ICD-10-CM | POA: Diagnosis not present

## 2021-09-16 DIAGNOSIS — M48062 Spinal stenosis, lumbar region with neurogenic claudication: Secondary | ICD-10-CM | POA: Diagnosis not present

## 2021-09-16 DIAGNOSIS — G8929 Other chronic pain: Secondary | ICD-10-CM | POA: Diagnosis not present

## 2021-09-16 DIAGNOSIS — M5442 Lumbago with sciatica, left side: Secondary | ICD-10-CM | POA: Diagnosis not present

## 2021-10-27 ENCOUNTER — Ambulatory Visit (INDEPENDENT_AMBULATORY_CARE_PROVIDER_SITE_OTHER): Payer: Medicare PPO

## 2021-10-27 DIAGNOSIS — Z0000 Encounter for general adult medical examination without abnormal findings: Secondary | ICD-10-CM | POA: Diagnosis not present

## 2021-10-27 NOTE — Progress Notes (Signed)
Subjective:   Clayton [name] is a 87 y.o. male who presents for Medicare Annual/Subsequent preventive examination.  Virtual Visit via Telephone Note  I connected with  Clayton [name] on 10/27/21 at  8:15 AM EDT by telephone and verified that I am speaking with the correct person using two identifiers.  Location: Patient: home Provider: [facility] Persons participating in the virtual visit: [place]   I discussed the limitations, risks, security and privacy concerns of performing an evaluation and management service by telephone and the availability of in person appointments. The patient expressed understanding and agreed to proceed.  Interactive audio and video telecommunications were attempted between this nurse and patient, however failed, due to patient having technical difficulties OR patient did not have access to video capability.  We continued and completed visit with audio only.  Some vital signs may be absent or patient reported.   Clemetine Marker, LPN   Review of Systems     Cardiac Risk Factors include: advanced age (>67mn, >>49women);male gender     Objective:    There were no vitals filed for this visit. There is no height or weight on file to calculate BMI.     10/27/2021    8:23 AM 10/26/2020    8:28 AM 04/20/2020    8:46 AM 04/14/2020   11:29 AM 10/23/2019    8:21 AM 01/08/2019    9:42 AM 08/30/2018   10:26 PM  Advanced Directives  Does Patient Have a Medical Advance Directive? Yes Yes No Yes No No No  Type of [place]  [place] Chart? No - copy requested No - copy requested  Yes - validated most recent copy scanned in chart (See row information)     Would patient like information on creating a medical advance directive?   No - Patient declined  Yes (MAU/Ambulatory/Procedural Areas -  Information given) No - Patient declined No - Patient declined    Current Medications (verified) Outpatient Encounter Medications as of 10/27/2021  Medication Sig   acetaminophen (TYLENOL) 500 MG tablet Take 1,000 mg by mouth every 6 (six) hours as needed for mild pain or moderate pain.    [DISCONTINUED] Multiple Vitamins-Minerals (MULTIVITAMIN WITH MINERALS) tablet Take 1 tablet by mouth daily. (Patient not taking: Reported on 07/02/2021)   [DISCONTINUED] nystatin cream (MYCOSTATIN) Apply 1 application topically 2 (two) times daily. (Patient not taking: Reported on 07/02/2021)   [DISCONTINUED] predniSONE (DELTASONE) 10 MG tablet Take 1 tablet (10 mg total) by mouth daily with breakfast. Taper 4,4,4,3,3,3,2,2,2,1,1,1,   No facility-administered encounter medications on file as of 10/27/2021.    Allergies (verified) Aspirin and Sulfa antibiotics   History: Past Medical History:  Diagnosis Date   Basal cell carcinoma    Blood transfusion without reported diagnosis 198(age 87   After traumatic brain injury   Broken ribs    2 BROKEN ON RIBS   CKD (chronic kidney disease)    PT DENIES   DVT (deep venous thrombosis) (HCC)    RIGHT LEG   Fracture dislocation of wrist    RIGHT   HTN (hypertension)    Past Surgical History:  Procedure Laterality Date   COLONOSCOPY  2014   cleared for 5 yrs   COLONOSCOPY N/A 09/01/2018   Procedure: COLONOSCOPY;  Surgeon: TVirgel Manifold MD;  Location: [facility] ENDOSCOPY;  Service: Endoscopy;  Laterality: N/A;   CRANIECTOMY FOR DEPRESSED SKULL FRACTURE  1960'S   ESOPHAGOGASTRODUODENOSCOPY N/A 09/01/2018   Procedure: ESOPHAGOGASTRODUODENOSCOPY (EGD);  Surgeon: Virgel Manifold, MD;  Location: [facility] ENDOSCOPY;  Service: Endoscopy;  Laterality: N/A;   HERNIA REPAIR     X2   IVC FILTER REMOVAL N/A 07/03/2017   Procedure: IVC FILTER REMOVAL;  Surgeon: Algernon Huxley, MD;  Location: [place] [facility];  Service: Cardiovascular;  Laterality: N/A;    LUMBAR LAMINECTOMY/DECOMPRESSION MICRODISCECTOMY N/A 04/20/2020   Procedure: L2/3, L3/4 LAMINECTOMY;  Surgeon: [name], MD;  Location: [facility] ORS;  Service: Neurosurgery;  Laterality: N/A;  2ND CASE   PERIPHERAL VASCULAR THROMBECTOMY Right 05/22/2017   Procedure: PERIPHERAL VASCULAR THROMBECTOMY;  Surgeon: Algernon Huxley, MD;  Location: [place] [facility];  Service: Cardiovascular;  Laterality: Right;   SPINE SURGERY  December 2021   TONSILLECTOMY     Family History  Problem Relation Age of Onset   Heart disease Father    Social History   Socioeconomic History   Marital status: Married    Spouse name: Not on file   Number of children: 3   Years of education: Not on file   Highest education level: Not on file  Occupational History   Not on file  Tobacco Use   Smoking status: Former    Packs/day: 0.50    Years: 3.50    Total pack years: 1.75    Types: Cigarettes    Quit date: 01/01/1959    Years since quitting: 62.8   Smokeless tobacco: Never   Tobacco comments:    I smoked from about age 40 to 59.  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   Social Determinants of Radio broadcast assistant Strain: Low Risk  (10/27/2021)   Overall Financial Resource Strain (CARDIA)    Difficulty of Paying Living Expenses: Not hard at all  Food Insecurity: No Food Insecurity (10/27/2021)   Hunger Vital Sign    Worried About Running Out of Food in the Last Year: Never true    Staples in the Last Year: Never true  Transportation Needs: No Transportation Needs (10/27/2021)   PRAPARE - Hydrologist  (Medical): No    Lack of Transportation (Non-Medical): No  Physical Activity: Sufficiently Active (10/27/2021)   Exercise Vital Sign    Days of Exercise per Week: 7 days    Minutes of Exercise per Session: 30 min  Stress: No Stress Concern Present (10/27/2021)   [place]    Feeling of Stress : Not at all  Social Connections: Moderately Integrated (10/27/2021)   Social Connection and Isolation Panel [NHANES]    Frequency of Communication with Friends and Family: More than three times a week    Frequency of Social Gatherings with Friends and Family: Once a week    Attends Religious Services: Never    Marine scientist or Organizations: Yes    Attends Archivist Meetings:  More than 4 times per year    Marital Status: Married    Tobacco Counseling Counseling given: Not Answered Tobacco comments: I smoked from about age 69 to 37.   Clinical Intake:  Pre-visit preparation completed: Yes  Pain : No/denies pain     Nutritional Risks: None Diabetes: No  How often do you need to have someone help you when you read instructions, pamphlets, or other written materials from your doctor or pharmacy?: 1 - Never    Interpreter Needed?: No  Information entered by :: Clemetine Marker LPN   Activities of Daily Living    10/27/2021    8:23 AM  In your present state of health, do you have any difficulty performing the following activities:  Hearing? 1  Vision? 0  Difficulty concentrating or making decisions? 0  Walking or climbing stairs? 0  Dressing or bathing? 0  Doing errands, shopping? 0  Preparing Food and eating ? N  Using the Toilet? N  In the past six months, have you accidently leaked urine? N  Do you have problems with loss of bowel control? N  Managing your Medications? N  Managing your Finances? N  Housekeeping or managing your Housekeeping? N    Patient Care Team: Juline Patch, MD as  PCP - General (Family Medicine)  Indicate any recent Medical Services you may have received from other than [facility] providers in the past year (date may be approximate).     Assessment:   This is a routine wellness examination for Clayton [name].  Hearing/Vision screen Hearing Screening - Comments::  Pt wears hearing aids maintained by [facility] Vision Screening - Comments:: Annual vision screenings done at [place] issues and exercise activities discussed: Current Exercise Habits: Home exercise routine, Type of exercise: walking, Time (Minutes): 30, Frequency (Times/Week): 7, Weekly Exercise (Minutes/Week): 210, Intensity: Mild, Exercise limited by: None identified   Goals Addressed   None    Depression Screen    10/27/2021    8:20 AM 07/02/2021   10:45 AM 11/27/2020    8:58 AM 10/26/2020    8:27 AM 01/31/2020    4:26 PM 12/30/2019    8:32 AM 11/26/2019    8:56 AM  PHQ 2/9 Scores  PHQ - 2 Score 0 0 0 0 0 0 0  PHQ- 9 Score  '2 1  4 '$ 0 1    Fall Risk    10/27/2021    8:23 AM 11/27/2020    8:58 AM 10/26/2020    8:29 AM 01/31/2020    4:26 PM 12/30/2019    8:32 AM  Fall Risk   Falls in the past year? 0 0 0 0 0  Number falls in past yr: 0 0 0    Injury with Fall? 0 0 0    Risk for fall due to : No Fall Risks No Fall Risks No Fall Risks    Follow up Falls prevention discussed Falls evaluation completed Falls prevention discussed Falls evaluation completed Falls evaluation completed    [place]:  Any stairs in or around the home? Yes  If so, are there any without handrails? No  Home free of loose throw rugs in walkways, pet beds, electrical cords, etc? Yes  Adequate lighting in your home to reduce risk of falls? Yes   ASSISTIVE DEVICES UTILIZED TO PREVENT FALLS:  Life alert? No  Use of a cane, walker or w/c? No  Grab bars in the bathroom? Yes  Shower chair or bench in shower? Yes  Elevated toilet seat or a handicapped toilet? No    TIMED UP AND GO:  Was the test performed? No . Telephonic visit.   Cognitive Function: Normal cognitive status assessed by direct observation by this Nurse Health Advisor. No abnormalities found.           10/26/2020    8:31 AM 10/23/2019    8:29 AM  6CIT Screen  What Year? 0 points 0 points  What month? 0 points 0 points  What time? 0 points 0 points  Count back from 20 0 points 0 points  Months in reverse 4 points 4 points  Repeat phrase 6 points 8 points  Total Score 10 points 12 points    Immunizations Immunization History  Administered Date(s) Administered   Influenza-Unspecified 08/12/2020   PFIZER Comirnaty(Gray Top)Covid-19 Tri-Sucrose Vaccine 08/12/2020   PFIZER(Purple Top)SARS-COV-2 Vaccination 05/09/2019, 06/01/2019, 01/29/2020   Tdap 09/14/2016    TDAP status: Up to date  Flu Vaccine status: Declined, Education has been provided regarding the importance of this vaccine but patient still declined. Advised may receive this vaccine at local pharmacy or Health Dept. Aware to provide a copy of the vaccination record if obtained from local pharmacy or Health Dept. Verbalized acceptance and understanding.  Pneumococcal vaccine status: Declined,  Education has been provided regarding the importance of this vaccine but patient still declined. Advised may receive this vaccine at local pharmacy or Health Dept. Aware to provide a copy of the vaccination record if obtained from local pharmacy or Health Dept. Verbalized acceptance and understanding.   Covid-19 vaccine status: Completed vaccines  Qualifies for Shingles Vaccine? Yes   Zostavax completed No   Shingrix Completed?: No.    Education has been provided regarding the importance of this vaccine. Patient has been advised to call insurance company to determine out of pocket expense if they have not yet received this vaccine. Advised may also receive vaccine at local pharmacy or Health Dept. Verbalized acceptance and  understanding.  Screening Tests Health Maintenance  Topic Date Due   Zoster Vaccines- Shingrix (1 of 2) Never done   Pneumonia Vaccine 82+ Years old (1 - PCV) Never done   COVID-19 Vaccine (5 - Pfizer series) 10/07/2020   INFLUENZA VACCINE  11/30/2021   TETANUS/TDAP  09/15/2026   HPV VACCINES  Aged Out    Health Maintenance  Health Maintenance Due  Topic Date Due   Zoster Vaccines- Shingrix (1 of 2) Never done   Pneumonia Vaccine 91+ Years old (1 - PCV) Never done   COVID-19 Vaccine (5 - Pfizer series) 10/07/2020    Colorectal cancer screening: No longer required.   Lung Cancer Screening: (Low Dose CT Chest recommended if Age 28-80 years, 30 pack-year currently smoking OR have quit w/in 15years.) does not qualify.   Additional Screening:  Hepatitis C Screening: does not qualify  Vision Screening: Recommended annual ophthalmology exams for early detection of glaucoma and other disorders of the eye. Is the patient up to date with their annual eye exam?  Yes  Who is the provider or what is the name of the office in which the patient attends annual eye exams? [facility].   Dental Screening: Recommended annual dental exams for proper oral hygiene  Community Resource Referral / Chronic Care Management: CRR required this visit?  No   CCM required this visit?  No      Plan:     I have personally reviewed and noted  the following in the patient's chart:   Medical and social history Use of alcohol, tobacco or illicit drugs  Current medications and supplements including opioid prescriptions. Patient is not currently taking opioid prescriptions. Functional ability and status Nutritional status Physical activity Advanced directives List of other physicians Hospitalizations, surgeries, and ER visits in previous 12 months Vitals Screenings to include cognitive, depression, and falls Referrals and appointments  In addition, I have reviewed and discussed with  patient certain preventive protocols, quality metrics, and best practice recommendations. A written personalized care plan for preventive services as well as general preventive health recommendations were provided to patient.     Clemetine Marker, LPN   07/19/377   Nurse Notes: none

## 2021-10-27 NOTE — Patient Instructions (Signed)
Clayton [name] , Thank you for taking time to come for your Medicare Wellness Visit. I appreciate your ongoing commitment to your health goals. Please review the following plan we discussed and let me know if I can assist you in the future.   Screening recommendations/referrals: Colonoscopy: no longer required Recommended yearly ophthalmology/optometry visit for glaucoma screening and checkup Recommended yearly dental visit for hygiene and checkup  Vaccinations: Influenza vaccine: declined Pneumococcal vaccine: declined Tdap vaccine: done 09/14/16 Shingles vaccine: Shingrix discussed. Please contact your pharmacy for coverage information.  Covid-19: done 05/09/19, 06/01/19, 01/29/20 & 08/12/20  Advanced directives: Please bring a copy of your health care power of attorney and living will to the office at your convenience.   Conditions/risks identified: Keep up the great work!  Next appointment: Follow up in one year for your annual wellness visit.   Preventive Care 81 Years and Older, Male Preventive care refers to lifestyle choices and visits with your health care provider that can promote health and wellness. What does preventive care include? A yearly physical exam. This is also called an annual well check. Dental exams once or twice a year. Routine eye exams. Ask your health care provider how often you should have your eyes checked. Personal lifestyle choices, including: Daily care of your teeth and gums. Regular physical activity. Eating a healthy diet. Avoiding tobacco and drug use. Limiting alcohol use. Practicing safe sex. Taking low doses of aspirin every day. Taking vitamin and mineral supplements as recommended by your health care provider. What happens during an annual well check? The services and screenings done by your health care provider during your annual well check will depend on your age, overall health, lifestyle risk factors, and family history of  disease. Counseling  Your health care provider may ask you questions about your: Alcohol use. Tobacco use. Drug use. Emotional well-being. Home and relationship well-being. Sexual activity. Eating habits. History of falls. Memory and ability to understand (cognition). Work and work Statistician. Screening  You may have the following tests or measurements: Height, weight, and BMI. Blood pressure. Lipid and cholesterol levels. These may be checked every 5 years, or more frequently if you are over 83 years old. Skin check. Lung cancer screening. You may have this screening every year starting at age 65 if you have a 30-pack-year history of smoking and currently smoke or have quit within the past 15 years. Fecal occult blood test (FOBT) of the stool. You may have this test every year starting at age 26. Flexible sigmoidoscopy or colonoscopy. You may have a sigmoidoscopy every 5 years or a colonoscopy every 10 years starting at age 12. Prostate cancer screening. Recommendations will vary depending on your family history and other risks. Hepatitis C blood test. Hepatitis B blood test. Sexually transmitted disease (STD) testing. Diabetes screening. This is done by checking your blood sugar (glucose) after you have not eaten for a while (fasting). You may have this done every 1-3 years. Abdominal aortic aneurysm (AAA) screening. You may need this if you are a current or former smoker. Osteoporosis. You may be screened starting at age 45 if you are at high risk. Talk with your health care provider about your test results, treatment options, and if necessary, the need for more tests. Vaccines  Your health care provider may recommend certain vaccines, such as: Influenza vaccine. This is recommended every year. Tetanus, diphtheria, and acellular pertussis (Tdap, Td) vaccine. You may need a Td booster every 10 years. Zoster vaccine. You may need  this after age 54. Pneumococcal 13-valent  conjugate (PCV13) vaccine. One dose is recommended after age 48. Pneumococcal polysaccharide (PPSV23) vaccine. One dose is recommended after age 54. Talk to your health care provider about which screenings and vaccines you need and how often you need them. This information is not intended to replace advice given to you by your health care provider. Make sure you discuss any questions you have with your health care provider. Document Released: 05/15/2015 Document Revised: 01/06/2016 Document Reviewed: 02/17/2015 Elsevier Interactive Patient Education  2017 [employer] Prevention in the Home Falls can cause injuries. They can happen to people of all ages. There are many things you can do to make your home safe and to help prevent falls. What can I do on the outside of my home? Regularly fix the edges of walkways and driveways and fix any cracks. Remove anything that might make you trip as you walk through a door, such as a raised step or threshold. Trim any bushes or trees on the path to your home. Use bright outdoor lighting. Clear any walking paths of anything that might make someone trip, such as rocks or tools. Regularly check to see if handrails are loose or broken. Make sure that both sides of any steps have handrails. Any raised decks and porches should have guardrails on the edges. Have any leaves, snow, or ice cleared regularly. Use sand or salt on walking paths during winter. Clean up any spills in your garage right away. This includes oil or grease spills. What can I do in the bathroom? Use night lights. Install grab bars by the toilet and in the tub and shower. Do not use towel bars as grab bars. Use non-skid mats or decals in the tub or shower. If you need to sit down in the shower, use a plastic, non-slip stool. Keep the floor dry. Clean up any water that spills on the floor as soon as it happens. Remove soap buildup in the tub or shower regularly. Attach bath mats  securely with double-sided non-slip rug tape. Do not have throw rugs and other things on the floor that can make you trip. What can I do in the bedroom? Use night lights. Make sure that you have a light by your bed that is easy to reach. Do not use any sheets or blankets that are too big for your bed. They should not hang down onto the floor. Have a firm chair that has side arms. You can use this for support while you get dressed. Do not have throw rugs and other things on the floor that can make you trip. What can I do in the kitchen? Clean up any spills right away. Avoid walking on wet floors. Keep items that you use a lot in easy-to-reach places. If you need to reach something above you, use a strong step stool that has a grab bar. Keep electrical cords out of the way. Do not use floor polish or wax that makes floors slippery. If you must use wax, use non-skid floor wax. Do not have throw rugs and other things on the floor that can make you trip. What can I do with my stairs? Do not leave any items on the stairs. Make sure that there are handrails on both sides of the stairs and use them. Fix handrails that are broken or loose. Make sure that handrails are as long as the stairways. Check any carpeting to make sure that it is firmly attached to  the stairs. Fix any carpet that is loose or worn. Avoid having throw rugs at the top or bottom of the stairs. If you do have throw rugs, attach them to the floor with carpet tape. Make sure that you have a light switch at the top of the stairs and the bottom of the stairs. If you do not have them, ask someone to add them for you. What else can I do to help prevent falls? Wear shoes that: Do not have high heels. Have rubber bottoms. Are comfortable and fit you well. Are closed at the toe. Do not wear sandals. If you use a stepladder: Make sure that it is fully opened. Do not climb a closed stepladder. Make sure that both sides of the stepladder  are locked into place. Ask someone to hold it for you, if possible. Clearly mark and make sure that you can see: Any grab bars or handrails. First and last steps. Where the edge of each step is. Use tools that help you move around (mobility aids) if they are needed. These include: Canes. Walkers. Scooters. Crutches. Turn on the lights when you go into a dark area. Replace any light bulbs as soon as they burn out. Set up your furniture so you have a clear path. Avoid moving your furniture around. If any of your floors are uneven, fix them. If there are any pets around you, be aware of where they are. Review your medicines with your doctor. Some medicines can make you feel dizzy. This can increase your chance of falling. Ask your doctor what other things that you can do to help prevent falls. This information is not intended to replace advice given to you by your health care provider. Make sure you discuss any questions you have with your health care provider. Document Released: 02/12/2009 Document Revised: 09/24/2015 Document Reviewed: 05/23/2014 Elsevier Interactive Patient Education  2017 [employer].

## 2021-11-29 DIAGNOSIS — M25511 Pain in right shoulder: Secondary | ICD-10-CM | POA: Diagnosis not present

## 2021-11-29 DIAGNOSIS — M5442 Lumbago with sciatica, left side: Secondary | ICD-10-CM | POA: Diagnosis not present

## 2021-11-29 DIAGNOSIS — M546 Pain in thoracic spine: Secondary | ICD-10-CM | POA: Diagnosis not present

## 2021-11-29 DIAGNOSIS — G8929 Other chronic pain: Secondary | ICD-10-CM | POA: Diagnosis not present

## 2021-11-29 DIAGNOSIS — M48062 Spinal stenosis, lumbar region with neurogenic claudication: Secondary | ICD-10-CM | POA: Diagnosis not present

## 2021-11-30 ENCOUNTER — Ambulatory Visit (INDEPENDENT_AMBULATORY_CARE_PROVIDER_SITE_OTHER): Payer: Medicare PPO | Admitting: Family Medicine

## 2021-11-30 ENCOUNTER — Encounter: Payer: Self-pay | Admitting: Family Medicine

## 2021-11-30 VITALS — BP 120/80 | HR 80 | Ht 69.0 in | Wt 172.0 lb

## 2021-11-30 DIAGNOSIS — Z0000 Encounter for general adult medical examination without abnormal findings: Secondary | ICD-10-CM

## 2021-11-30 DIAGNOSIS — R195 Other fecal abnormalities: Secondary | ICD-10-CM

## 2021-11-30 LAB — HEMOCCULT GUIAC POC 1CARD (OFFICE): Fecal Occult Blood, POC: NEGATIVE

## 2021-11-30 MED ORDER — PANTOPRAZOLE SODIUM 40 MG PO TBEC: 40.0000 mg | DELAYED_RELEASE_TABLET | Freq: Every day | ORAL | 3 refills | Status: DC

## 2021-11-30 NOTE — Progress Notes (Signed)
Date:  11/30/2021   Name:  Clayton [name]   DOB:  November 02, 1934   MRN:  [id number]   Chief Complaint: Annual Exam  Patient is a 87 year old male who presents for a comprehensive physical exam. The patient reports the following problems: none. Health maintenance has been reviewed up to date.      Lab Results  Component Value Date   NA 141 11/27/2020   K 5.0 11/27/2020   CO2 20 11/27/2020   GLUCOSE 100 (H) 11/27/2020   BUN 15 11/27/2020   CREATININE 1.23 11/27/2020   CALCIUM 9.4 11/27/2020   EGFR 58 (L) 11/27/2020   GFRNONAA >60 04/14/2020   Lab Results  Component Value Date   CHOL 212 (H) 11/27/2020   HDL 56 11/27/2020   LDLCALC 138 (H) 11/27/2020   TRIG 99 11/27/2020   CHOLHDL 3.5 12/16/2015   No results found for: "TSH" No results found for: "HGBA1C" Lab Results  Component Value Date   WBC 8.3 11/27/2020   HGB 15.6 11/27/2020   HCT 47.6 11/27/2020   MCV 97 11/27/2020   PLT 339 11/27/2020   Lab Results  Component Value Date   ALT 18 11/26/2019   AST 23 11/26/2019   ALKPHOS 62 11/26/2019   BILITOT 0.6 11/26/2019   No results found for: "25OHVITD2", "25OHVITD3", "VD25OH"   Review of Systems  Constitutional:  Negative for chills and fever.  HENT:  Negative for drooling, ear discharge, ear pain, postnasal drip and sore throat.   Eyes:  Negative for redness.  Respiratory:  Negative for cough, shortness of breath and wheezing.   Cardiovascular:  Negative for chest pain, palpitations and leg swelling.  Gastrointestinal:  Negative for abdominal pain, blood in stool, constipation, diarrhea and nausea.  Endocrine: Negative for cold intolerance, heat intolerance, polydipsia and polyuria.  Genitourinary:  Negative for difficulty urinating, dysuria, frequency, hematuria and urgency.  Musculoskeletal:  Negative for arthralgias, back pain, myalgias and neck pain.  Skin:  Negative for rash.  Allergic/Immunologic: Negative for environmental allergies.  Neurological:   Negative for dizziness and headaches.  Hematological:  Does not bruise/bleed easily.  Psychiatric/Behavioral:  Negative for suicidal ideas. The patient is not nervous/anxious.     Patient Active Problem List   Diagnosis Date Noted   Acute left-sided low back pain with left-sided sciatica 02/25/2020   Lumbar stenosis with neurogenic claudication 02/25/2020   Symptomatic anemia 09/03/2018   GI bleed 09/02/2018   Diverticulosis of colon with hemorrhage    Acute gastrointestinal hemorrhage    Hiatal hernia    Stomach irritation    Diverticulosis of small intestine without hemorrhage    HTN (hypertension) 08/30/2018   Blood in stool 08/30/2018   Deep vein thrombosis (DVT) of proximal vein of right lower extremity ([place]) 06/14/2017    Allergies  Allergen Reactions   Aspirin Rash   Sulfa Antibiotics Rash    Past Surgical History:  Procedure Laterality Date   COLONOSCOPY  2014   cleared for 5 yrs   COLONOSCOPY N/A 09/01/2018   Procedure: COLONOSCOPY;  Surgeon: Virgel Manifold, MD;  Location: [facility] ENDOSCOPY;  Service: Endoscopy;  Laterality: N/A;   CRANIECTOMY FOR DEPRESSED SKULL FRACTURE  1960'S   ESOPHAGOGASTRODUODENOSCOPY N/A 09/01/2018   Procedure: ESOPHAGOGASTRODUODENOSCOPY (EGD);  Surgeon: Virgel Manifold, MD;  Location: [facility] ENDOSCOPY;  Service: Endoscopy;  Laterality: N/A;   HERNIA REPAIR     X2   IVC FILTER REMOVAL N/A 07/03/2017   Procedure: IVC FILTER REMOVAL;  Surgeon:  Algernon Huxley, MD;  Location: [place] [facility];  Service: Cardiovascular;  Laterality: N/A;   LUMBAR LAMINECTOMY/DECOMPRESSION MICRODISCECTOMY N/A 04/20/2020   Procedure: L2/3, L3/4 LAMINECTOMY;  Surgeon: [name], MD;  Location: [facility] ORS;  Service: Neurosurgery;  Laterality: N/A;  2ND CASE   PERIPHERAL VASCULAR THROMBECTOMY Right 05/22/2017   Procedure: PERIPHERAL VASCULAR THROMBECTOMY;  Surgeon: Algernon Huxley, MD;  Location: [place] [facility];  Service: Cardiovascular;  Laterality:  Right;   SPINE SURGERY  December 2021   TONSILLECTOMY      Social History   Tobacco Use   Smoking status: Former    Packs/day: 0.50    Years: 3.50    Total pack years: 1.75    Types: Cigarettes    Quit date: 01/01/1959    Years since quitting: 62.9   Smokeless tobacco: Never   Tobacco comments:    I smoked from about age 43 to 58.  Vaping Use   Vaping Use: Never used  Substance Use Topics   Alcohol use: Yes    Alcohol/week: 3.0 standard drinks of alcohol    Types: 3 Glasses of wine per week    Comment: once a week   Drug use: No     Medication list has been reviewed and updated.  Current Meds  Medication Sig   acetaminophen (TYLENOL) 500 MG tablet Take 1,000 mg by mouth every 6 (six) hours as needed for mild pain or moderate pain.        11/30/2021    8:36 AM 07/02/2021   10:45 AM 11/27/2020    8:59 AM 01/31/2020    4:27 PM  GAD 7 : Generalized Anxiety Score  Nervous, Anxious, on Edge 0 0 0 0  Control/stop worrying 0 0 0 0  Worry too much - different things 0 0 0 0  Trouble relaxing 0 0 0 1  Restless 0 0 0 0  Easily annoyed or irritable 0 0 0 0  Afraid - awful might happen 0 0 0 0  Total GAD 7 Score 0 0 0 1  Anxiety Difficulty Not difficult at all Not difficult at all  Not difficult at all       11/30/2021    8:36 AM 10/27/2021    8:20 AM 07/02/2021   10:45 AM  Depression screen PHQ 2/9  Decreased Interest 0 0 0  Down, Depressed, Hopeless 0 0 0  PHQ - 2 Score 0 0 0  Altered sleeping 1  2  Tired, decreased energy 1  0  Change in appetite 0  0  Feeling bad or failure about yourself  0  0  Trouble concentrating 0  0  Moving slowly or fidgety/restless 0  0  Suicidal thoughts 0  0  PHQ-9 Score 2  2  Difficult doing work/chores Not difficult at all  Not difficult at all    BP Readings from Last 3 Encounters:  11/30/21 120/80  07/02/21 120/78  11/27/20 120/70    Physical Exam Vitals and nursing note reviewed.  Constitutional:      Appearance: He is  well-groomed.  HENT:     Head: Normocephalic.     Jaw: There is normal jaw occlusion.     Right Ear: Hearing, tympanic membrane, ear canal and external ear normal.     Left Ear: Hearing, tympanic membrane, ear canal and external ear normal.     Nose: Nose normal. No congestion.     Right Turbinates: Not enlarged.     Left  Turbinates: Not enlarged.     Mouth/Throat:     Lips: Pink.     Mouth: Mucous membranes are dry.     Dentition: Normal dentition.     Tongue: No lesions.     Palate: No mass.     Pharynx: Oropharynx is clear. Uvula midline. No pharyngeal swelling or oropharyngeal exudate.  Eyes:     General: Lids are normal. Vision grossly intact. No scleral icterus.       Right eye: No discharge.        Left eye: No discharge.     Extraocular Movements: Extraocular movements intact.     Right eye: Normal extraocular motion.     Left eye: Normal extraocular motion.     Conjunctiva/sclera: Conjunctivae normal.     Pupils: Pupils are equal, round, and reactive to light.     Funduscopic exam:    Right eye: Red reflex present.        Left eye: Red reflex present. Neck:     Thyroid: No thyroid mass or thyromegaly.     Vascular: Normal carotid pulses. No carotid bruit, hepatojugular reflux or JVD.     Trachea: Trachea and phonation normal. No tracheal deviation.  Cardiovascular:     Rate and Rhythm: Normal rate and regular rhythm.     Chest Wall: PMI is not displaced. No thrill.     Pulses: Normal pulses.          Carotid pulses are 2+ on the right side and 2+ on the left side.      Radial pulses are 2+ on the right side and 2+ on the left side.       Femoral pulses are 2+ on the right side and 2+ on the left side.      Popliteal pulses are 2+ on the right side and 2+ on the left side.       Dorsalis pedis pulses are 2+ on the right side and 2+ on the left side.       Posterior tibial pulses are 2+ on the right side and 2+ on the left side.     Heart sounds: Normal heart sounds  and S1 normal. No murmur heard.    No systolic murmur is present.     No diastolic murmur is present.     No friction rub. No gallop. No S3 or S4 sounds.  Pulmonary:     Effort: Pulmonary effort is normal. No respiratory distress.     Breath sounds: Normal breath sounds. No decreased breath sounds, wheezing, rhonchi or rales.  Chest:     Chest wall: No mass.  Breasts:    Right: Normal. No mass.     Left: Normal. No mass.  Abdominal:     General: Bowel sounds are normal.     Palpations: Abdomen is soft. There is no hepatomegaly, splenomegaly or mass.     Tenderness: There is no abdominal tenderness. There is no guarding or rebound.     Hernia: No hernia is present.  Genitourinary:    Penis: Normal.      Testes: Normal.     Prostate: Normal.     Rectum: Guaiac result positive. No mass or tenderness.  Musculoskeletal:        General: No tenderness. Normal range of motion.     Cervical back: Normal range of motion and neck supple.  Lymphadenopathy:     Head:     Right side of head: No submental or submandibular  adenopathy.     Left side of head: No submental or submandibular adenopathy.     Cervical: No cervical adenopathy.     Right cervical: No superficial, deep or posterior cervical adenopathy.    Left cervical: No superficial, deep or posterior cervical adenopathy.     Upper Body:     Right upper body: No supraclavicular or axillary adenopathy.     Left upper body: No supraclavicular or axillary adenopathy.     Lower Body: No right inguinal adenopathy. No left inguinal adenopathy.  Skin:    General: Skin is warm.     Capillary Refill: Capillary refill takes less than 2 seconds.     Findings: No abrasion or rash.  Neurological:     General: No focal deficit present.     Mental Status: He is alert and oriented to person, place, and time.     Cranial Nerves: Cranial nerves 2-12 are intact. No cranial nerve deficit.     Sensory: Sensation is intact.     Motor: Motor function  is intact.     Coordination: Romberg sign negative.     Deep Tendon Reflexes: Reflexes are normal and symmetric.  Psychiatric:        Behavior: Behavior is cooperative.     Wt Readings from Last 3 Encounters:  11/30/21 172 lb (78 kg)  07/02/21 181 lb (82.1 kg)  11/27/20 175 lb (79.4 kg)    BP 120/80   Pulse 80   Ht _0  (1.753 m)   Wt 172 lb (78 kg)   BMI 25.40 kg/m   Assessment and Plan:  1. Annual physical exam No subjective/objective concerns noted during history of present illness, review of past medical history, review of systems, and physical exam..  We will continue with recheck of lipid panel and PSA renal function panel and CBC - Lipid Panel With LDL/HDL Ratio - PSA - Renal Function Panel - CBC w/Diff/Platelet  2. Stool guaiac positive Upon evaluation it was noted that patient had a dark stool consistent with [place] guaiac was noted to be positive.  Upon review of history patient's spouse remembers him bringing up that he saw some blood earlier in the month.  We will check CBC for current level of hemoglobin as well as obtain H. pylori to see if there is any chance of ulceration and initiate pantoprazole 40 mg once a day in case he has a gastritis and polyp.  Patient is on no medications that would irritate the stomach and we will refer to gastroenterology for further evaluation and possible endoscopy. - CBC w/Diff/Platelet - POCT Occult Blood Stool - Ambulatory referral to Gastroenterology - H. pylori antibody, IgG - pantoprazole (PROTONIX) 40 MG tablet; Take 1 tablet (40 mg total) by mouth daily.  Dispense: 30 tablet; Refill: 3

## 2021-12-01 ENCOUNTER — Encounter: Payer: Self-pay | Admitting: Family Medicine

## 2021-12-01 LAB — CBC WITH DIFFERENTIAL/PLATELET
Basophils Absolute: 0.1 10*3/uL (ref 0.0–0.2)
Basos: 1 %
EOS (ABSOLUTE): 0.1 10*3/uL (ref 0.0–0.4)
Eos: 1 %
Hematocrit: 46.5 % (ref 37.5–51.0)
Hemoglobin: 15.3 g/dL (ref 13.0–17.7)
Immature Grans (Abs): 0 10*3/uL (ref 0.0–0.1)
Immature Granulocytes: 0 %
Lymphocytes Absolute: 1.8 10*3/uL (ref 0.7–3.1)
Lymphs: 24 %
MCH: 31.1 pg (ref 26.6–33.0)
MCHC: 32.9 g/dL (ref 31.5–35.7)
MCV: 95 fL (ref 79–97)
Monocytes Absolute: 0.8 10*3/uL (ref 0.1–0.9)
Monocytes: 10 %
Neutrophils Absolute: 4.7 10*3/uL (ref 1.4–7.0)
Neutrophils: 64 %
Platelets: 355 10*3/uL (ref 150–450)
RBC: 4.92 x10E6/uL (ref 4.14–5.80)
RDW: 11.7 % (ref 11.6–15.4)
WBC: 7.5 10*3/uL (ref 3.4–10.8)

## 2021-12-01 LAB — LIPID PANEL WITH LDL/HDL RATIO
Cholesterol, Total: 189 mg/dL (ref 100–199)
HDL: 43 mg/dL (ref >39)
LDL Chol Calc (NIH): 120 mg/dL — ABNORMAL HIGH (ref 0–99)
LDL/HDL Ratio: 2.8 ratio (ref 0.0–3.6)
Triglycerides: 143 mg/dL (ref 0–149)
VLDL Cholesterol Cal: 26 mg/dL (ref 5–40)

## 2021-12-01 LAB — RENAL FUNCTION PANEL
Albumin: 4.5 g/dL (ref 3.7–4.7)
BUN/Creatinine Ratio: 11 (ref 10–24)
BUN: 15 mg/dL (ref 8–27)
CO2: 22 mmol/L (ref 20–29)
Calcium: 9.1 mg/dL (ref 8.6–10.2)
Chloride: 103 mmol/L (ref 96–106)
Creatinine, Ser: 1.42 mg/dL — ABNORMAL HIGH (ref 0.76–1.27)
Glucose: 109 mg/dL — ABNORMAL HIGH (ref 70–99)
Phosphorus: 3.4 mg/dL (ref 2.8–4.1)
Potassium: 4.5 mmol/L (ref 3.5–5.2)
Sodium: 141 mmol/L (ref 134–144)
eGFR: 49 mL/min/{1.73_m2} — ABNORMAL LOW (ref >59)

## 2021-12-01 LAB — H. PYLORI ANTIBODY, IGG: H. pylori, IgG AbS: 0.77 Index Value (ref 0.00–0.79)

## 2021-12-01 LAB — PSA: Prostate Specific Ag, Serum: 1.7 ng/mL (ref 0.0–4.0)

## 2021-12-02 ENCOUNTER — Other Ambulatory Visit: Payer: Self-pay

## 2021-12-02 DIAGNOSIS — R195 Other fecal abnormalities: Secondary | ICD-10-CM

## 2021-12-02 MED ORDER — PANTOPRAZOLE SODIUM 40 MG PO TBEC: 40.0000 mg | DELAYED_RELEASE_TABLET | Freq: Every day | ORAL | 3 refills | Status: DC

## 2022-04-08 DIAGNOSIS — H903 Sensorineural hearing loss, bilateral: Secondary | ICD-10-CM | POA: Diagnosis not present

## 2022-04-11 ENCOUNTER — Ambulatory Visit: Payer: Medicare PPO | Admitting: Gastroenterology

## 2022-05-06 DIAGNOSIS — H903 Sensorineural hearing loss, bilateral: Secondary | ICD-10-CM | POA: Diagnosis not present

## 2022-05-17 DIAGNOSIS — H2513 Age-related nuclear cataract, bilateral: Secondary | ICD-10-CM | POA: Diagnosis not present

## 2022-05-17 DIAGNOSIS — H5203 Hypermetropia, bilateral: Secondary | ICD-10-CM | POA: Diagnosis not present

## 2022-05-21 ENCOUNTER — Ambulatory Visit
Admission: EM | Admit: 2022-05-21 | Discharge: 2022-05-21 | Disposition: A | Discharge status: Home / Self Care | Payer: Medicare PPO | Source: Home / Self Care

## 2022-05-21 ENCOUNTER — Ambulatory Visit (INDEPENDENT_AMBULATORY_CARE_PROVIDER_SITE_OTHER): Payer: Medicare PPO

## 2022-05-21 DIAGNOSIS — R059 Cough, unspecified: Secondary | ICD-10-CM | POA: Diagnosis not present

## 2022-05-21 DIAGNOSIS — S0990XA Unspecified injury of head, initial encounter: Secondary | ICD-10-CM | POA: Diagnosis not present

## 2022-05-21 DIAGNOSIS — Z8249 Family history of ischemic heart disease and other diseases of the circulatory system: Secondary | ICD-10-CM | POA: Diagnosis not present

## 2022-05-21 DIAGNOSIS — Z1152 Encounter for screening for COVID-19: Secondary | ICD-10-CM | POA: Insufficient documentation

## 2022-05-21 DIAGNOSIS — R296 Repeated falls: Secondary | ICD-10-CM | POA: Diagnosis present

## 2022-05-21 DIAGNOSIS — J9601 Acute respiratory failure with hypoxia: Secondary | ICD-10-CM | POA: Diagnosis present

## 2022-05-21 DIAGNOSIS — Z66 Do not resuscitate: Secondary | ICD-10-CM | POA: Diagnosis present

## 2022-05-21 DIAGNOSIS — I2699 Other pulmonary embolism without acute cor pulmonale: Secondary | ICD-10-CM | POA: Diagnosis not present

## 2022-05-21 DIAGNOSIS — K449 Diaphragmatic hernia without obstruction or gangrene: Secondary | ICD-10-CM | POA: Diagnosis present

## 2022-05-21 DIAGNOSIS — J189 Pneumonia, unspecified organism: Secondary | ICD-10-CM

## 2022-05-21 DIAGNOSIS — K219 Gastro-esophageal reflux disease without esophagitis: Secondary | ICD-10-CM | POA: Diagnosis present

## 2022-05-21 DIAGNOSIS — E871 Hypo-osmolality and hyponatremia: Secondary | ICD-10-CM | POA: Diagnosis not present

## 2022-05-21 DIAGNOSIS — Z85828 Personal history of other malignant neoplasm of skin: Secondary | ICD-10-CM | POA: Diagnosis not present

## 2022-05-21 DIAGNOSIS — R140 Abdominal distension (gaseous): Secondary | ICD-10-CM | POA: Diagnosis not present

## 2022-05-21 DIAGNOSIS — G9341 Metabolic encephalopathy: Secondary | ICD-10-CM | POA: Diagnosis present

## 2022-05-21 DIAGNOSIS — I10 Essential (primary) hypertension: Secondary | ICD-10-CM | POA: Diagnosis present

## 2022-05-21 DIAGNOSIS — Z882 Allergy status to sulfonamides status: Secondary | ICD-10-CM | POA: Diagnosis not present

## 2022-05-21 DIAGNOSIS — E872 Acidosis, unspecified: Secondary | ICD-10-CM | POA: Diagnosis present

## 2022-05-21 DIAGNOSIS — E876 Hypokalemia: Secondary | ICD-10-CM | POA: Diagnosis present

## 2022-05-21 DIAGNOSIS — H9190 Unspecified hearing loss, unspecified ear: Secondary | ICD-10-CM | POA: Diagnosis present

## 2022-05-21 DIAGNOSIS — M6281 Muscle weakness (generalized): Secondary | ICD-10-CM | POA: Diagnosis not present

## 2022-05-21 DIAGNOSIS — Z87820 Personal history of traumatic brain injury: Secondary | ICD-10-CM | POA: Diagnosis not present

## 2022-05-21 DIAGNOSIS — Z87891 Personal history of nicotine dependence: Secondary | ICD-10-CM | POA: Diagnosis not present

## 2022-05-21 DIAGNOSIS — Z86718 Personal history of other venous thrombosis and embolism: Secondary | ICD-10-CM | POA: Diagnosis not present

## 2022-05-21 DIAGNOSIS — R0602 Shortness of breath: Secondary | ICD-10-CM | POA: Diagnosis not present

## 2022-05-21 DIAGNOSIS — R410 Disorientation, unspecified: Secondary | ICD-10-CM | POA: Diagnosis not present

## 2022-05-21 DIAGNOSIS — J439 Emphysema, unspecified: Secondary | ICD-10-CM | POA: Diagnosis not present

## 2022-05-21 DIAGNOSIS — S199XXA Unspecified injury of neck, initial encounter: Secondary | ICD-10-CM | POA: Diagnosis not present

## 2022-05-21 LAB — RESP PANEL BY RT-PCR (RSV, FLU A&B, COVID)  RVPGX2
Influenza A by PCR: NEGATIVE
Influenza B by PCR: NEGATIVE
Resp Syncytial Virus by PCR: NEGATIVE
SARS Coronavirus 2 by RT PCR: NEGATIVE

## 2022-05-21 MED ORDER — ALBUTEROL SULFATE HFA 108 (90 BASE) MCG/ACT IN AERS: 2.0000 | INHALATION_SPRAY | RESPIRATORY_TRACT | 0 refills | Status: DC | PRN

## 2022-05-21 MED ORDER — AZITHROMYCIN 250 MG PO TABS: 250.0000 mg | ORAL_TABLET | Freq: Every day | ORAL | 0 refills | Status: DC

## 2022-05-21 MED ORDER — AMOXICILLIN-POT CLAVULANATE 875-125 MG PO TABS: 1.0000 | ORAL_TABLET | Freq: Two times a day (BID) | ORAL | 0 refills | Status: DC

## 2022-05-21 MED ORDER — BENZONATATE 100 MG PO CAPS: 100.0000 mg | ORAL_CAPSULE | Freq: Three times a day (TID) | ORAL | 0 refills | Status: DC

## 2022-05-21 MED ORDER — PROMETHAZINE-DM 6.25-15 MG/5ML PO SYRP: 2.5000 mL | ORAL_SOLUTION | Freq: Every evening | ORAL | 0 refills | Status: DC | PRN

## 2022-05-21 NOTE — ED Provider Notes (Signed)
[facility]    CSN: [id number] Arrival date & time: 05/21/22  0820      History   Chief Complaint Chief Complaint  Patient presents with   Cough    HPI Clayton [name] is a 88 y.o. male.   Patient presents for evaluation of subjective fever, body aches, general malaise and weakness and a nonproductive persistent cough for 3 days.  Wife endorses confusion this morning, unable to recall basic day-to-day activities such as making coffee.  Endorses weakness to the legs patient has been off balance but has not fallen.  Cough worsening overnight, interfering with her sleep.  Wife endorses shallow breathing first noticed here in the office.  No known sick contacts.  Decreased appetite, tolerating fluids.  Denies shortness of breath, wheezing, ear pain, sore throat.  Past Medical History:  Diagnosis Date   Basal cell carcinoma    Blood transfusion without reported diagnosis 37 (age 64)   After traumatic brain injury   Broken ribs    2 BROKEN ON RIBS   CKD (chronic kidney disease)    PT DENIES   DVT (deep venous thrombosis) ([facility])    RIGHT LEG   Fracture dislocation of wrist    RIGHT   HTN (hypertension)    Symptomatic anemia 09/03/2018    Patient Active Problem List   Diagnosis Date Noted   Acute left-sided low back pain with left-sided sciatica 02/25/2020   Lumbar stenosis with neurogenic claudication 02/25/2020   Symptomatic anemia 09/03/2018   GI bleed 09/02/2018   Diverticulosis of colon with hemorrhage    Acute gastrointestinal hemorrhage    Hiatal hernia    Stomach irritation    Diverticulosis of small intestine without hemorrhage    HTN (hypertension) 08/30/2018   Blood in stool 08/30/2018   Deep vein thrombosis (DVT) of proximal vein of right lower extremity ([place]) 06/14/2017    Past Surgical History:  Procedure Laterality Date   COLONOSCOPY  2014   cleared for 5 yrs   COLONOSCOPY N/A 09/01/2018   Procedure: COLONOSCOPY;  Surgeon: Virgel Manifold, MD;  Location: [facility] ENDOSCOPY;  Service: Endoscopy;  Laterality: N/A;   CRANIECTOMY FOR DEPRESSED SKULL FRACTURE  1960'S   ESOPHAGOGASTRODUODENOSCOPY N/A 09/01/2018   Procedure: ESOPHAGOGASTRODUODENOSCOPY (EGD);  Surgeon: Virgel Manifold, MD;  Location: [facility] ENDOSCOPY;  Service: Endoscopy;  Laterality: N/A;   HERNIA REPAIR     X2   IVC FILTER REMOVAL N/A 07/03/2017   Procedure: IVC FILTER REMOVAL;  Surgeon: Algernon Huxley, MD;  Location: [place] [facility];  Service: Cardiovascular;  Laterality: N/A;   LUMBAR LAMINECTOMY/DECOMPRESSION MICRODISCECTOMY N/A 04/20/2020   Procedure: L2/3, L3/4 LAMINECTOMY;  Surgeon: [name], MD;  Location: [facility];  Service: Neurosurgery;  Laterality: N/A;  2ND CASE   PERIPHERAL VASCULAR THROMBECTOMY Right 05/22/2017   Procedure: PERIPHERAL VASCULAR THROMBECTOMY;  Surgeon: Algernon Huxley, MD;  Location: [place] [facility];  Service: Cardiovascular;  Laterality: Right;   SPINE SURGERY  December 2021   TONSILLECTOMY         Home Medications    Prior to Admission medications   Medication Sig Start Date End Date Taking? Authorizing Provider  acetaminophen (TYLENOL) 500 MG tablet Take 1,000 mg by mouth every 6 (six) hours as needed for mild pain or moderate pain.    Yes [provider]  pantoprazole (PROTONIX) 40 MG tablet Take 1 tablet (40 mg total) by mouth daily. 12/02/21  Yes Juline Patch, MD    Family  History Family History  Problem Relation Age of Onset   Heart disease Father     Social History Social History   Tobacco Use   Smoking status: Former    Packs/day: 0.50    Years: 3.50    Total pack years: 1.75    Types: Cigarettes    Quit date: 01/01/1959    Years since quitting: 63.4   Smokeless tobacco: Never   Tobacco comments:    I smoked from about age 61 to 35.  Vaping Use   Vaping Use: Never used  Substance Use Topics   Alcohol use: Yes    Alcohol/week: 3.0 standard drinks of alcohol    Types: 3 Glasses  of wine per week    Comment: once a week   Drug use: No     Allergies   Aspirin and Sulfa antibiotics   Review of Systems Review of Systems  Constitutional:  Positive for fever. Negative for activity change, appetite change, chills, diaphoresis, fatigue and unexpected weight change.  HENT:  Positive for congestion and rhinorrhea. Negative for dental problem, drooling, ear discharge, ear pain, facial swelling, hearing loss, mouth sores, nosebleeds, postnasal drip, sinus pressure, sinus pain, sneezing, sore throat, tinnitus, trouble swallowing and voice change.   Respiratory:  Positive for cough. Negative for apnea, choking, chest tightness, shortness of breath, wheezing and stridor.   Cardiovascular: Negative.   Gastrointestinal: Negative.   Musculoskeletal:  Positive for myalgias. Negative for arthralgias, back pain, gait problem, joint swelling, neck pain and neck stiffness.     Physical Exam Triage Vital Signs ED Triage Vitals  Enc Vitals Group     BP 05/21/22 0833 134/78     Pulse Rate 05/21/22 0833 98     Resp 05/21/22 0833 18     Temp 05/21/22 0833 98.4 F (36.9 C)     Temp Source 05/21/22 0833 Oral     SpO2 05/21/22 0833 96 %     Weight 05/21/22 0831 165 lb (74.8 kg)     Height 05/21/22 0831 '5\' 8"'$  (1.727 m)     Head Circumference --      Peak Flow --      Pain Score 05/21/22 0829 0     Pain Loc --      Pain Edu? --      Excl. in [place]? --    No data found.  Updated Vital Signs BP 134/78 (BP Location: Left Arm)   Pulse 98   Temp 98.4 F (36.9 C) (Oral)   Resp 18   Ht '5\' 8"'$  (1.727 m)   Wt 165 lb (74.8 kg)   SpO2 96%   BMI 25.09 kg/m   Visual Acuity Right Eye Distance:   Left Eye Distance:   Bilateral Distance:    Right Eye Near:   Left Eye Near:    Bilateral Near:     Physical Exam Constitutional:      Appearance: Normal appearance.  HENT:     Head: Normocephalic.     Right Ear: Tympanic membrane, ear canal and external ear normal.     Left Ear:  Tympanic membrane, ear canal and external ear normal.     Nose: Congestion and rhinorrhea present.     Mouth/Throat:     Mouth: Mucous membranes are moist.     Pharynx: Oropharynx is clear.  Eyes:     Extraocular Movements: Extraocular movements intact.  Cardiovascular:     Rate and Rhythm: Normal rate and regular rhythm.  Pulses: Normal pulses.     Heart sounds: Normal heart sounds.  Pulmonary:     Comments: Shallow and rapid breathing witnessed, lungs sounds are diminished but clear in all lobes Skin:    General: Skin is warm and dry.  Neurological:     Mental Status: He is alert and oriented to person, place, and time. Mental status is at baseline.      UC Treatments / Results  Labs (all labs ordered are listed, but only abnormal results are displayed) Labs Reviewed  RESP PANEL BY RT-PCR (RSV, FLU A&B, COVID)  [id number]    EKG   Radiology No results found.  Procedures Procedures (including critical care time)  Medications Ordered in UC Medications - No data to display  Initial Impression / Assessment and Plan / UC Course  I have reviewed the triage vital signs and the nursing notes.  Pertinent labs & imaging results that were available during my care of the patient were reviewed by me and considered in my medical decision making (see chart for details).  Community-acquired pneumonia of right and left lung  Vital signs are stable, O2 saturation ranging from 91-96 on room air, shallow breathing witnessed intermittently, lungs are clear and diminished, stable for outpatient management at this time, pneumonia confirmed via x-ray, viral testing negative, discussed with patient and spouse, prescribed azithromycin and Augmentin as well as Tessalon, Promethazine DM and albuterol inhaler for supportive care, may continue use of over-the-counter medication as needed advise follow-up with PCP in 2 weeks for reevaluation, given strict precautions at any point if any symptoms  worsen he is to go to the nearest emergency department for reevaluation Final Clinical Impressions(s) / UC Diagnoses   Final diagnoses:  None   Discharge Instructions   None    ED Prescriptions   None    PDMP not reviewed this encounter.   Hans [place], [place] 05/21/22 [phone]

## 2022-05-21 NOTE — ED Triage Notes (Signed)
Pt is with his wife  Pt c/o cough, nasal congestion, loss of balance, temperature of 101.8 x3days  Pt wife states that he is mostly isolated and has not been around anyone but his wife. Pt wife asks to only treat the symptoms.

## 2022-05-21 NOTE — Discharge Instructions (Addendum)
You are being treated for pneumonia, seen on chest x-ray to both sides of the lungs  COVID, flu and RSV testing is negative  Begin Augmentin every morning and every evening for 7 days  Begin azithromycin as directed  You may use Tessalon pill every 8 hours to help calm coughing  You may use cough syrup at bedtime only, be mindful of this will make him feel drowsy  You may take 2 puffs of albuterol inhaler every 4-6 hours as needed for shallow breathing and shortness of breath  Please schedule follow-up appointment with primary doctor in 2 weeks for reevaluation  At any point if symptoms worsen please go to the nearest emergency department for immediate evaluation  You can take Tylenol and/or Ibuprofen as needed for fever reduction and pain relief.   For cough: honey 1/2 to 1 teaspoon (you can dilute the honey in water or another fluid).  You can also use guaifenesin and dextromethorphan for cough. You can use a humidifier for chest congestion and cough.  If you don't have a humidifier, you can sit in the bathroom with the hot shower running.      For sore throat: try warm salt water gargles, cepacol lozenges, throat spray, warm tea or water with lemon/honey, popsicles or ice, or OTC cold relief medicine for throat discomfort.   For congestion: take a daily anti-histamine like Zyrtec, Claritin, and a oral decongestant, such as pseudoephedrine.  You can also use Flonase 1-2 sprays in each nostril daily.   It is important to stay hydrated: drink plenty of fluids (water, gatorade/powerade/pedialyte, juices, or teas) to keep your throat moisturized and help further relieve irritation/discomfort.

## 2022-05-22 ENCOUNTER — Encounter: Payer: Self-pay | Admitting: Family Medicine

## 2022-05-24 ENCOUNTER — Emergency Department: Payer: Medicare PPO

## 2022-05-24 ENCOUNTER — Other Ambulatory Visit: Payer: Self-pay

## 2022-05-24 ENCOUNTER — Inpatient Hospital Stay
Admission: EM | Admit: 2022-05-24 | Discharge: 2022-05-26 | DRG: 193 | Disposition: A | Discharge status: Home Health | Payer: Medicare PPO | Attending: Internal Medicine | Admitting: Internal Medicine

## 2022-05-24 ENCOUNTER — Encounter: Payer: Self-pay | Admitting: Emergency Medicine

## 2022-05-24 DIAGNOSIS — Z87891 Personal history of nicotine dependence: Secondary | ICD-10-CM | POA: Diagnosis not present

## 2022-05-24 DIAGNOSIS — Z66 Do not resuscitate: Secondary | ICD-10-CM | POA: Diagnosis present

## 2022-05-24 DIAGNOSIS — Z86718 Personal history of other venous thrombosis and embolism: Secondary | ICD-10-CM

## 2022-05-24 DIAGNOSIS — G9341 Metabolic encephalopathy: Secondary | ICD-10-CM | POA: Diagnosis present

## 2022-05-24 DIAGNOSIS — Z8249 Family history of ischemic heart disease and other diseases of the circulatory system: Secondary | ICD-10-CM

## 2022-05-24 DIAGNOSIS — E871 Hypo-osmolality and hyponatremia: Secondary | ICD-10-CM | POA: Diagnosis not present

## 2022-05-24 DIAGNOSIS — H9190 Unspecified hearing loss, unspecified ear: Secondary | ICD-10-CM | POA: Diagnosis present

## 2022-05-24 DIAGNOSIS — Z85828 Personal history of other malignant neoplasm of skin: Secondary | ICD-10-CM | POA: Diagnosis not present

## 2022-05-24 DIAGNOSIS — E876 Hypokalemia: Secondary | ICD-10-CM | POA: Insufficient documentation

## 2022-05-24 DIAGNOSIS — E872 Acidosis, unspecified: Secondary | ICD-10-CM | POA: Diagnosis present

## 2022-05-24 DIAGNOSIS — R296 Repeated falls: Secondary | ICD-10-CM | POA: Diagnosis present

## 2022-05-24 DIAGNOSIS — K449 Diaphragmatic hernia without obstruction or gangrene: Secondary | ICD-10-CM | POA: Diagnosis present

## 2022-05-24 DIAGNOSIS — I10 Essential (primary) hypertension: Secondary | ICD-10-CM | POA: Diagnosis present

## 2022-05-24 DIAGNOSIS — J9601 Acute respiratory failure with hypoxia: Secondary | ICD-10-CM | POA: Diagnosis present

## 2022-05-24 DIAGNOSIS — Z882 Allergy status to sulfonamides status: Secondary | ICD-10-CM | POA: Diagnosis not present

## 2022-05-24 DIAGNOSIS — K219 Gastro-esophageal reflux disease without esophagitis: Secondary | ICD-10-CM | POA: Diagnosis present

## 2022-05-24 DIAGNOSIS — J189 Pneumonia, unspecified organism: Secondary | ICD-10-CM | POA: Diagnosis present

## 2022-05-24 DIAGNOSIS — Z87820 Personal history of traumatic brain injury: Secondary | ICD-10-CM | POA: Diagnosis not present

## 2022-05-24 DIAGNOSIS — R918 Other nonspecific abnormal finding of lung field: Secondary | ICD-10-CM

## 2022-05-24 DIAGNOSIS — Z1152 Encounter for screening for COVID-19: Secondary | ICD-10-CM | POA: Diagnosis not present

## 2022-05-24 DIAGNOSIS — W19XXXA Unspecified fall, initial encounter: Secondary | ICD-10-CM | POA: Diagnosis present

## 2022-05-24 HISTORY — DX: Gastro-esophageal reflux disease without esophagitis: K21.9

## 2022-05-24 HISTORY — DX: Diaphragmatic hernia without obstruction or gangrene: K44.9

## 2022-05-24 LAB — CBC
HCT: 43.5 % (ref 39.0–52.0)
Hemoglobin: 14.3 g/dL (ref 13.0–17.0)
MCH: 31 pg (ref 26.0–34.0)
MCHC: 32.9 g/dL (ref 30.0–36.0)
MCV: 94.4 fL (ref 80.0–100.0)
Platelets: 357 10*3/uL (ref 150–400)
RBC: 4.61 MIL/uL (ref 4.22–5.81)
RDW: 12.9 % (ref 11.5–15.5)
WBC: 15.3 10*3/uL — ABNORMAL HIGH (ref 4.0–10.5)
nRBC: 0 % (ref 0.0–0.2)

## 2022-05-24 LAB — LACTIC ACID, PLASMA
Lactic Acid, Venous: 1.9 mmol/L (ref 0.5–1.9)
Lactic Acid, Venous: 1.7 mmol/L (ref 0.5–1.9)

## 2022-05-24 LAB — URINALYSIS, ROUTINE W REFLEX MICROSCOPIC
Bacteria, UA: NONE SEEN
Bilirubin Urine: NEGATIVE
Glucose, UA: NEGATIVE mg/dL
Ketones, ur: 5 mg/dL — AB
Leukocytes,Ua: NEGATIVE
Nitrite: NEGATIVE
Protein, ur: 30 mg/dL — AB
Specific Gravity, Urine: 1.043 — ABNORMAL HIGH (ref 1.005–1.030)
Squamous Epithelial / HPF: NONE SEEN /HPF (ref 0–5)
WBC, UA: NONE SEEN WBC/hpf (ref 0–5)
pH: 5 (ref 5.0–8.0)

## 2022-05-24 LAB — BASIC METABOLIC PANEL
Anion gap: 13 (ref 5–15)
BUN: 24 mg/dL — ABNORMAL HIGH (ref 8–23)
CO2: 21 mmol/L — ABNORMAL LOW (ref 22–32)
Calcium: 8.6 mg/dL — ABNORMAL LOW (ref 8.9–10.3)
Chloride: 99 mmol/L (ref 98–111)
Creatinine, Ser: 1.12 mg/dL (ref 0.61–1.24)
GFR, Estimated: >60 mL/min (ref >60)
Glucose, Bld: 103 mg/dL — ABNORMAL HIGH (ref 70–99)
Potassium: 3.8 mmol/L (ref 3.5–5.1)
Sodium: 133 mmol/L — ABNORMAL LOW (ref 135–145)

## 2022-05-24 LAB — RESP PANEL BY RT-PCR (FLU A&B, COVID) ARPGX2
Influenza A by PCR: NEGATIVE
Influenza B by PCR: NEGATIVE
SARS Coronavirus 2 by RT PCR: NEGATIVE

## 2022-05-24 LAB — CBG MONITORING, ED: Glucose-Capillary: 108 mg/dL — ABNORMAL HIGH (ref 70–99)

## 2022-05-24 MED ORDER — SODIUM CHLORIDE 0.9 % IV SOLN
2.0000 g | Freq: Once | INTRAVENOUS | Status: AC
  Administered 2022-05-24: 2 g via INTRAVENOUS
  Filled 2022-05-24: qty 12.5

## 2022-05-24 MED ORDER — ONDANSETRON HCL 4 MG/2ML IJ SOLN: 4.0000 mg | Freq: Four times a day (QID) | INTRAMUSCULAR | Status: DC | PRN

## 2022-05-24 MED ORDER — VANCOMYCIN HCL IN DEXTROSE 1-5 GM/200ML-% IV SOLN: 1000.0000 mg | Freq: Once | INTRAVENOUS | Status: DC

## 2022-05-24 MED ORDER — ACETAMINOPHEN 500 MG PO TABS: 1000.0000 mg | ORAL_TABLET | Freq: Four times a day (QID) | ORAL | Status: DC | PRN

## 2022-05-24 MED ORDER — ENOXAPARIN SODIUM 40 MG/0.4ML IJ SOSY
40.0000 mg | PREFILLED_SYRINGE | Freq: Every day | INTRAMUSCULAR | Status: DC
  Administered 2022-05-24 – 2022-05-25 (×2): 40 mg via SUBCUTANEOUS
  Filled 2022-05-24 (×2): qty 0.4

## 2022-05-24 MED ORDER — SODIUM CHLORIDE 0.9 % IV SOLN: INTRAVENOUS | Status: AC

## 2022-05-24 MED ORDER — IOHEXOL 350 MG/ML SOLN
75.0000 mL | Freq: Once | INTRAVENOUS | Status: AC | PRN
  Administered 2022-05-24: 75 mL via INTRAVENOUS

## 2022-05-24 MED ORDER — VANCOMYCIN HCL 1500 MG/300ML IV SOLN
1500.0000 mg | Freq: Once | INTRAVENOUS | Status: AC
  Administered 2022-05-24: 1500 mg via INTRAVENOUS
  Filled 2022-05-24: qty 300

## 2022-05-24 MED ORDER — ONDANSETRON HCL 4 MG PO TABS: 4.0000 mg | ORAL_TABLET | Freq: Four times a day (QID) | ORAL | Status: DC | PRN

## 2022-05-24 MED ORDER — BENZONATATE 100 MG PO CAPS
100.0000 mg | ORAL_CAPSULE | Freq: Three times a day (TID) | ORAL | Status: DC
  Administered 2022-05-24 – 2022-05-26 (×6): 100 mg via ORAL
  Filled 2022-05-24 (×7): qty 1

## 2022-05-24 MED ORDER — ALBUTEROL SULFATE (2.5 MG/3ML) 0.083% IN NEBU: 3.0000 mL | INHALATION_SOLUTION | RESPIRATORY_TRACT | Status: DC | PRN

## 2022-05-24 MED ORDER — PANTOPRAZOLE SODIUM 40 MG PO TBEC
40.0000 mg | DELAYED_RELEASE_TABLET | Freq: Every day | ORAL | Status: DC
  Administered 2022-05-25 – 2022-05-26 (×2): 40 mg via ORAL
  Filled 2022-05-24 (×2): qty 1

## 2022-05-24 MED ORDER — SODIUM CHLORIDE 0.9 % IV SOLN
500.0000 mg | INTRAVENOUS | Status: DC
  Administered 2022-05-24 – 2022-05-25 (×2): 500 mg via INTRAVENOUS
  Filled 2022-05-24 (×2): qty 5
  Filled 2022-05-24: qty 500

## 2022-05-24 MED ORDER — SODIUM CHLORIDE 0.9 % IV BOLUS: 1000.0000 mL | Freq: Once | INTRAVENOUS | Status: DC

## 2022-05-24 MED ORDER — SODIUM CHLORIDE 0.9 % IV SOLN
2.0000 g | INTRAVENOUS | Status: DC
  Administered 2022-05-24 – 2022-05-25 (×2): 2 g via INTRAVENOUS
  Filled 2022-05-24: qty 2
  Filled 2022-05-24 (×2): qty 20

## 2022-05-24 MED ORDER — LACTATED RINGERS IV BOLUS (SEPSIS)
1000.0000 mL | Freq: Once | INTRAVENOUS | Status: AC
  Administered 2022-05-24: 1000 mL via INTRAVENOUS

## 2022-05-24 NOTE — ED Notes (Signed)
Pt family at bedside

## 2022-05-24 NOTE — Progress Notes (Signed)
Elink following sepsis bundle. °

## 2022-05-24 NOTE — Assessment & Plan Note (Signed)
Patient presents for evaluation of worsening shortness of breath treated with a cough and was noted to be hypoxic with room air pulse oximetry of 88% that has improved with oxygen supplementation at 2 L Imaging is consistent with multifocal pneumonia We will place patient empirically on Rocephin and Zithromax Speech therapy consult for swallow function evaluation Obtain urine Legionella antigen Continue oxygen supplementation to maintain pulse oximetry greater than 92%

## 2022-05-24 NOTE — Assessment & Plan Note (Signed)
Continue PPI

## 2022-05-24 NOTE — Progress Notes (Signed)
CODE SEPSIS - PHARMACY COMMUNICATION  **Broad Spectrum Antibiotics should be administered within 1 hour of Sepsis diagnosis**  Time Code Sepsis Called/Page Received: 1118  Antibiotics Ordered: cefepime and vancomycin, not ordered until 1213  Time of 1st antibiotic administration: 1310  Additional action taken by pharmacy: [place] ,PharmD Clinical Pharmacist  05/24/2022  11:32 AM

## 2022-05-24 NOTE — ED Provider Notes (Signed)
[facility] Provider Note    Event Date/Time   First MD Initiated Contact with Patient 05/24/22 1102     (approximate)   History   Weakness and Shortness of Breath   HPI  Clayton [name] is a 88 y.o. male with a remote history of DVT  Reviewed note from nurse practitioner White from January 20 patient was seen for body aches shortness of breath and cough.  Final diagnosis is listed as "none" but there is mention of community-acquired pneumonia of the right lung.  At that point it does do note the patient is being treated for pneumonia prescribed azithromycin and Augmentin as well as Tessalon promethazine and albuterol  Additional listed history includes DVT hypertension prior GI bleeding  He has been feeling weak and fatigued and has had a few falls since leaving urgent care.  Denies injury.  Reports feeling fatigued, not hydrating well  Physical Exam   Triage Vital Signs: ED Triage Vitals  Enc Vitals Group     BP 05/24/22 1047 (!) 143/82     Pulse Rate 05/24/22 1047 91     Resp 05/24/22 1047 18     Temp 05/24/22 1047 (!) 97.5 F (36.4 C)     Temp Source 05/24/22 1047 Oral     SpO2 05/24/22 1047 92 %     Weight 05/24/22 1056 162 lb (73.5 kg)     Height 05/24/22 1056 '5\' 7"'$  (1.702 m)     Head Circumference --      Peak Flow --      Pain Score 05/24/22 1056 0     Pain Loc --      Pain Edu? --      Excl. in [place]? --     Most recent vital signs: Vitals:   05/24/22 1304 05/24/22 1330  BP: 139/80 (!) 144/76  Pulse: 75 72  Resp: (!) 32 (!) 22  Temp:    SpO2: 92% 98%     General: Awake, no distress.  Slight tachypnea oxygen saturation 88% on room air corrects with 2 L nasal cannula CV:  Good peripheral perfusion.  Normal tones and rate Resp:  Normal effort.  Diminished in the bases with slight tachypnea but otherwise normal effort.  No extremis.  No wheezing.  Faint crackles noted in the right lower Abd:  No distention.  Soft  nontenderOther:  Mucous membranes are dry   ED Results / Procedures / Treatments   Labs (all labs ordered are listed, but only abnormal results are displayed) Labs Reviewed  BASIC METABOLIC PANEL - Abnormal; Notable for the following components:      Result Value   Sodium 133 (*)    CO2 21 (*)    Glucose, Bld 103 (*)    BUN 24 (*)    Calcium 8.6 (*)    All other components within normal limits  CBC - Abnormal; Notable for the following components:   WBC 15.3 (*)    All other components within normal limits  CBG MONITORING, ED - Abnormal; Notable for the following components:   Glucose-Capillary 108 (*)    All other components within normal limits  RESP PANEL BY RT-PCR (FLU A&B, COVID) ARPGX2  CULTURE, BLOOD (ROUTINE X 2)  CULTURE, BLOOD (ROUTINE X 2)  URINALYSIS, ROUTINE W REFLEX MICROSCOPIC     EKG  And interpreted by me at 11 AM heart rate 85 QRS 110 QTc 420 Normal sinus rhythm, no evidence of acute ischemia.  First-degree  AV block   RADIOLOGY   Chest x-ray interpreted by me as multifocal pneumonia pattern   CT HEAD WO CONTRAST  Result Date: 05/24/2022 CLINICAL DATA:  Head trauma, moderate-severe EXAM: CT HEAD WITHOUT CONTRAST TECHNIQUE: Contiguous axial images were obtained from the base of the skull through the vertex without intravenous contrast. RADIATION DOSE REDUCTION: This exam was performed according to the departmental dose-optimization program which includes automated exposure control, adjustment of the mA and/or kV according to patient size and/or use of iterative reconstruction technique. COMPARISON:  01/08/2019 FINDINGS: Brain: There is periventricular white matter decreased attenuation consistent with small vessel ischemic changes. Ventricles, sulci and cisterns are prominent consistent with age related involutional changes. No acute intracranial hemorrhage, mass effect or shift. No hydrocephalus. Vascular: No hyperdense vessel or unexpected calcification.  Skull: Normal. Negative for fracture or focal lesion. Sinuses/Orbits: Orbits are unremarkable. Mucoperiosteal thickening consistent with chronic left maxillary sinusitis. IMPRESSION: Atrophy and chronic small vessel ischemic changes. No acute intracranial process identified. Electronically Signed   By: Sammie Bench M.D.   On: 05/24/2022 13:07   CT Angio Chest PE W and/or Wo Contrast  Result Date: 05/24/2022 CLINICAL DATA:  Pulmonary embolism. Cough, nasal congestion and loss of balance. Frequent falls. EXAM: CT ANGIOGRAPHY CHEST WITH CONTRAST TECHNIQUE: Multidetector CT imaging of the chest was performed using the standard protocol during bolus administration of intravenous contrast. Multiplanar CT image reconstructions and MIPs were obtained to evaluate the vascular anatomy. RADIATION DOSE REDUCTION: This exam was performed according to the departmental dose-optimization program which includes automated exposure control, adjustment of the mA and/or kV according to patient size and/or use of iterative reconstruction technique. CONTRAST:  31m OMNIPAQUE IOHEXOL 350 MG/ML SOLN COMPARISON:  X-ray 05/24/2022 earlier and older FINDINGS: Cardiovascular: Normal cardiopericardial silhouette. No pericardial effusion. Prominent coronary artery calcifications are seen. The thoracic aorta has a bovine type aortic arch. Mild scattered atherosclerotic partially calcified plaque. No dissection or aneurysm formation. Mild plaque extends along the origin of the great vessels. Significant breathing motion. This limits evaluation of small and peripheral emboli. No segmental or larger pulmonary embolism clearly seen. There is some enlargement of the pulmonary artery particularly on the right side. Please correlate for any evidence of pulmonary artery hypertension. Mediastinum/Nodes: Large hiatal hernia with wall thickening along the thoracic esophagus. The esophagus also slightly patulous. No specific abnormal lymph node  enlargement present in the axillary regions. There are some small hilar nodes. Example on the right inferiorly on image 73 of series 7 measures 13 by 10 mm. Left-sided focus on image 62 measures 11 by 8 mm. There are some small mediastinal nodes identified which overall less than a cm in short axis and not pathologic by size criteria. Slightly more numerous than usually seen. Lungs/Pleura: There are multifocal areas of interstitial septal thickening and ground-glass type pulmonary opacities particularly in the lower lobes, dependent right upper lobe, lingula. Please correlate for etiology. This could represent some crazy paving. Recommend follow-up. No pleural effusion or pneumothorax. There is diffuse breathing motion. Upper Abdomen: Right adrenal gland is preserved. The left is not included in the imaging field. Musculoskeletal: Slight curvature of the spine with some degenerative changes. Diffuse bridging osteophytes and syndesmophytes. Review of the MIP images confirms the above findings. IMPRESSION: 1. No segmental or larger pulmonary embolism clearly seen. Significant breathing motion limits evaluation of small and peripheral emboli. 2. Enlargement of the pulmonary artery particularly on the right side. Please correlate for any evidence of pulmonary artery hypertension. 3. Large  hiatal hernia with wall thickening along the thoracic esophagus. Please correlate clinically for esophagitis. 4. Multifocal areas of interstitial septal thickening and ground-glass type pulmonary opacities particularly in the lower lobes, dependent right upper lobe, lingula. Please correlate for etiology. This could represent some crazy paving. Recommend follow-up. Prominent hilar and mediastinal nodes may also be reactive recommend attention on follow-up. Aortic Atherosclerosis (ICD10-I70.0) and Emphysema (ICD10-J43.9). Electronically Signed   By: Jill Side M.D.   On: 05/24/2022 13:05   CT Cervical Spine Wo Contrast  Result  Date: 05/24/2022 CLINICAL DATA:  Neck trauma, frequent falls and confusion EXAM: CT CERVICAL SPINE WITHOUT CONTRAST TECHNIQUE: Multidetector CT imaging of the cervical spine was performed without intravenous contrast. Multiplanar CT image reconstructions were also generated. RADIATION DOSE REDUCTION: This exam was performed according to the departmental dose-optimization program which includes automated exposure control, adjustment of the mA and/or kV according to patient size and/or use of iterative reconstruction technique. COMPARISON:  01/08/2019 FINDINGS: Alignment: No listhesis. Skull base and vertebrae: No acute fracture. No primary bone lesion or focal pathologic process. Soft tissues and spinal canal: No prevertebral fluid or swelling. No visible canal hematoma. Disc levels: Mild degenerative changes without high-grade spinal canal stenosis. Upper chest: For findings in the thorax, please see same day CT chest. Other: None. IMPRESSION: 1. No acute fracture or traumatic malalignment of the cervical spine. 2. For findings in the thorax, please see same day CT chest. Electronically Signed   By: Merilyn Baba M.D.   On: 05/24/2022 13:03   DG Chest Portable 1 View  Result Date: 05/24/2022 CLINICAL DATA:  Shortness of breath. EXAM: PORTABLE CHEST 1 VIEW COMPARISON:  CXR 01/09/19 FINDINGS: No pleural effusion. No pneumothorax. Unchanged cardiac mediastinal contours with the moderate-sized hiatal hernia. There are patchy bilateral airspace opacities there are new from prior exam. Visualized upper abdomen is notable for gas distended loops of small and large bowel. IMPRESSION: New patchy bilateral airspace opacities, suspicious for multifocal pneumonia. Electronically Signed   By: Marin Roberts M.D.   On: 05/24/2022 11:16      PROCEDURES:  Critical Care performed: No  Procedures   MEDICATIONS ORDERED IN ED: Medications  vancomycin (VANCOREADY) IVPB 1500 mg/300 mL (has no administration in time range)   acetaminophen (TYLENOL) tablet 1,000 mg (has no administration in time range)  pantoprazole (PROTONIX) EC tablet 40 mg (has no administration in time range)  benzonatate (TESSALON) capsule 100 mg (has no administration in time range)  albuterol (VENTOLIN HFA) 108 (90 Base) MCG/ACT inhaler 2 puff (has no administration in time range)  cefTRIAXone (ROCEPHIN) 2 g in sodium chloride 0.9 % 100 mL IVPB (has no administration in time range)  azithromycin (ZITHROMAX) 500 mg in sodium chloride 0.9 % 250 mL IVPB (has no administration in time range)  enoxaparin (LOVENOX) injection 40 mg (has no administration in time range)  0.9 %  sodium chloride infusion (has no administration in time range)  ondansetron (ZOFRAN) tablet 4 mg (has no administration in time range)    Or  ondansetron (ZOFRAN) injection 4 mg (has no administration in time range)  lactated ringers bolus 1,000 mL (0 mLs Intravenous Stopped 05/24/22 1341)  ceFEPIme (MAXIPIME) 2 g in sodium chloride 0.9 % 100 mL IVPB (2 g Intravenous New Bag/Given 05/24/22 1310)  iohexol (OMNIPAQUE) 350 MG/ML injection 75 mL (75 mLs Intravenous Contrast Given 05/24/22 1235)     IMPRESSION / MDM / ASSESSMENT AND PLAN / ED COURSE  I reviewed the triage vital signs and  the nursing notes.                              Differential diagnosis includes, but is not limited to, worsening community-acquired pneumonia, exclude pulmonary embolism, pneumothorax, empyema, bronchitis viral pattern, etc.    Patient's presentation is most consistent with acute presentation with potential threat to life or bodily function.   The patient is on the cardiac monitor to evaluate for evidence of arrhythmia and/or significant heart rate changes.   ----------------------------------------- 1:47 PM on 05/24/2022 ----------------------------------------- Leukocytosis noted on lab interpretation.  Mildly elevated BUN.  Negative COVID and influenza testing.  Based on the pedicle  presentation and imaging I am concerned the patient has a worsening pneumonia.  Will initiate broader spectrum antibiotics admit to hospital with oxygen deficit that corrects on nasal cannula.  Additionally, incidental CT findings of the chest are also noted  Admission discussed with and patient accepted by Dr. Francine Graven  Patient and his family at the bedside both understand agreeable with plan for admission.  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     FINAL CLINICAL IMPRESSION(S) / ED DIAGNOSES   Final diagnoses:  Community acquired pneumonia, unspecified laterality  Abnormal CT scan of lung     Rx / DC Orders   ED Discharge Orders     None        Note:  This document was prepared using Dragon voice recognition software and may include unintentional dictation errors.   Delman Kitten, MD 05/24/22 1348

## 2022-05-24 NOTE — Assessment & Plan Note (Signed)
Secondary to underlying pneumonia At baseline is usually oriented to person, place and time but is currently oriented only to person and place which according to his wife is unusual and only started in the last 1 week Expect improvement in patient's mental status following resolution of acute illness

## 2022-05-24 NOTE — ED Notes (Signed)
ED provider at bedside.

## 2022-05-24 NOTE — Assessment & Plan Note (Signed)
Patient is normotensive Monitor closely during this hospitalization

## 2022-05-24 NOTE — Assessment & Plan Note (Signed)
Secondary to acute illness We will place patient on fall precautions PT evaluation once appropriate

## 2022-05-24 NOTE — ED Notes (Addendum)
Pt to CT

## 2022-05-24 NOTE — Consult Note (Signed)
PHARMACY -  BRIEF ANTIBIOTIC NOTE   Pharmacy has received consult(s) for cefepime and vancomycin dosing from an ED provider.  The patient's profile has been reviewed for ht/wt/allergies/indication/available labs.    One time order(s) placed for Vancomycin 1500 mg IV and cefepime 2 grams IV  Further antibiotics/pharmacy consults should be ordered by admitting physician if indicated.                       Thank you, Lorin Picket, PharmD 05/24/2022  12:25 PM

## 2022-05-24 NOTE — ED Notes (Signed)
Per pt family, pt had diarrhea for about 12 hours last night. It wasn't several bouts but "it was like a continuous leak, like his (rectum) muscle lost control and stopped working."

## 2022-05-24 NOTE — H&P (Addendum)
History and Physical    Patient: Clayton [name] ZJI:[id number] DOB: 1935-04-30 DOA: 05/24/2022 DOS: the patient was seen and examined on 05/24/2022 PCP: Juline Patch, MD  Patient coming from: Home  Chief Complaint:  Chief Complaint  Patient presents with   Weakness   Shortness of Breath    Most of the history was obtained from his wife at the bedside.  Patient is hard of hearing and does not have his hearing aids he is also only oriented to person and place which is not his baseline HPI: Clayton [name] is a 88 y.o. male with medical history significant for hypertension, traumatic brain injury, history of DVT who was brought into the ER by EMS for evaluation of feeling unwell. Patient's wife states that his symptoms started a week prior to presentation and started with a cough.  She also had a cough and assumed he had an upper respiratory tract infection.  She states that her symptoms improved but he continued to get worse and developed shortness of breath, confusion and a fever and so she took him to an urgent care on 05/21/22 where he was diagnosed with a pneumonia and sent home with prescriptions for antibiotic therapy which included amoxicillin and Zithromax. She drove him home from the urgent care center and states that when they arrived home while trying to get out of the car he fell landing on the concrete.  He also became incontinent of loose watery stools and that lasted for about 12 hours and resolved.  She notes that his oral intake has been very poor, he has been very weak and confused.  According to his wife he has had several falls since 05/21/22 without any loss of consciousness.  She contacted his primary care provider who advised her to bring him to the emergency room for further evaluation. At baseline patient does not require an assist device for ambulation.  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 He  denies having any chest pain, no abdominal pain, no headache, no dizziness, no lightheadedness, no leg swelling, no blurred vision no focal deficit. Abnormal labs include sodium 133, white count of 15.3 Respiratory viral panel is negative Chest x-ray reviewed by me shows new patchy bilateral airspace opacities, suspicious for multifocal pneumonia. CT angiogram of the chest is negative for PE but shows large hiatal hernia with wall thickening along the thoracic esophagus. Please correlate clinically for esophagitis. Multifocal areas of interstitial septal thickening and ground-glass type pulmonary opacities particularly in the lower lobes, dependent right upper lobe, lingula.  He received a dose of vancomycin and cefepime, 1 L IV fluid bolus in the ER and will be admitted to the hospital for further evaluation   Review of Systems: As mentioned in the history of present illness. All other systems reviewed and are negative. Past Medical History:  Diagnosis Date   Basal cell carcinoma    Blood transfusion without reported diagnosis 79 (age 69)   After traumatic brain injury   Broken ribs    2 BROKEN ON RIBS   CKD (chronic kidney disease)    PT DENIES   DVT (deep venous thrombosis) (HCC)    RIGHT LEG   Fracture dislocation of wrist    RIGHT   HTN (hypertension)    Symptomatic anemia 09/03/2018   Past Surgical History:  Procedure Laterality Date   COLONOSCOPY  2014   cleared for 5  yrs   COLONOSCOPY N/A 09/01/2018   Procedure: COLONOSCOPY;  Surgeon: Virgel Manifold, MD;  Location: [facility] ENDOSCOPY;  Service: Endoscopy;  Laterality: N/A;   CRANIECTOMY FOR DEPRESSED SKULL FRACTURE  1960'S   ESOPHAGOGASTRODUODENOSCOPY N/A 09/01/2018   Procedure: ESOPHAGOGASTRODUODENOSCOPY (EGD);  Surgeon: Virgel Manifold, MD;  Location: [facility] ENDOSCOPY;  Service: Endoscopy;  Laterality: N/A;   HERNIA REPAIR     X2   IVC FILTER REMOVAL N/A 07/03/2017   Procedure: IVC FILTER REMOVAL;  Surgeon: Algernon Huxley, MD;  Location: [place] [facility] LAB;  Service: Cardiovascular;  Laterality: N/A;   LUMBAR LAMINECTOMY/DECOMPRESSION MICRODISCECTOMY N/A 04/20/2020   Procedure: L2/3, L3/4 LAMINECTOMY;  Surgeon: [name], MD;  Location: [facility] ORS;  Service: Neurosurgery;  Laterality: N/A;  2ND CASE   PERIPHERAL VASCULAR THROMBECTOMY Right 05/22/2017   Procedure: PERIPHERAL VASCULAR THROMBECTOMY;  Surgeon: Algernon Huxley, MD;  Location: [place] [facility];  Service: Cardiovascular;  Laterality: Right;   SPINE SURGERY  December 2021   TONSILLECTOMY     Social History:  reports that he quit smoking about 63 years ago. His smoking use included cigarettes. He has a 1.75 pack-year smoking history. He has never used smokeless tobacco. He reports current alcohol use of about 3.0 standard drinks of alcohol per week. He reports that he does not use drugs.  Allergies  Allergen Reactions   Aspirin Rash   Sulfa Antibiotics Rash    Family History  Problem Relation Age of Onset   Heart disease Father     Prior to Admission medications   Medication Sig Start Date End Date Taking? Authorizing Provider  acetaminophen (TYLENOL) 500 MG tablet Take 1,000 mg by mouth every 6 (six) hours as needed for mild pain or moderate pain.     [provider]  albuterol (VENTOLIN HFA) 108 (90 Base) MCG/ACT inhaler Inhale 2 puffs into the lungs every 4 (four) hours as needed for wheezing or shortness of breath. 05/21/22   Hans Eden, NP  amoxicillin-clavulanate (AUGMENTIN) 875-125 MG tablet Take 1 tablet by mouth every 12 (twelve) hours. 05/21/22   White, Leitha Schuller, NP  azithromycin (ZITHROMAX) 250 MG tablet Take 1 tablet (250 mg total) by mouth daily. Take first 2 tablets together, then 1 every day until finished. 05/21/22   White, Leitha Schuller, NP  benzonatate (TESSALON) 100 MG capsule Take 1 capsule (100 mg total) by mouth every 8 (eight) hours. 05/21/22   White, Leitha Schuller, NP  pantoprazole (PROTONIX) 40 MG tablet  Take 1 tablet (40 mg total) by mouth daily. 12/02/21   Juline Patch, MD  promethazine-dextromethorphan (PROMETHAZINE-DM) 6.25-15 MG/5ML syrup Take 2.5 mLs by mouth at bedtime as needed. 05/21/22   Hans Eden, NP    Physical Exam: Vitals:   05/24/22 1304 05/24/22 1330 05/24/22 1400 05/24/22 1430  BP: 139/80 (!) 144/76 (!) 145/101 (!) 144/74  Pulse: 75 72 79 78  Resp: (!) 32 (!) 22 (!) 28 (!) 64  Temp:      TempSrc:      SpO2: 92% 98% 92% 98%  Weight:      Height:       Physical Exam Vitals and nursing note reviewed.  Constitutional:      Comments: Oriented to person and place.  Patient is hard of hearing  HENT:     Head: Normocephalic.     Mouth/Throat:     Comments: Dry mucous membrane Eyes:     Comments: Pale conjunctiva  Cardiovascular:  Rate and Rhythm: Normal rate and regular rhythm.  Pulmonary:     Effort: Tachypnea present.     Breath sounds: Examination of the right-upper field reveals rhonchi. Examination of the left-upper field reveals rhonchi. Examination of the right-middle field reveals rhonchi. Examination of the left-middle field reveals rhonchi. Examination of the right-lower field reveals rhonchi. Examination of the left-lower field reveals rhonchi. Rhonchi present.  Abdominal:     General: Bowel sounds are normal.     Palpations: Abdomen is soft.  Musculoskeletal:        General: Normal range of motion.     Cervical back: Normal range of motion and neck supple.  Skin:    General: Skin is warm and dry.  Neurological:     Mental Status: He is alert.     Motor: Weakness present.  Psychiatric:        Mood and Affect: Mood normal.        Behavior: Behavior normal.     Data Reviewed: Relevant notes from primary care and specialist visits, past discharge summaries as available in EHR, including Care Everywhere. Prior diagnostic testing as pertinent to current admission diagnoses Updated medications and problem lists for reconciliation ED course,  including vitals, labs, imaging, treatment and response to treatment Triage notes, nursing and pharmacy notes and ED provider's notes Notable results as noted in HPI Labs reviewed.  Sodium 133, potassium 3.8, chloride 99, bicarb 21, glucose 103, BUN 24, creatinine 1.12, calcium 8.6, white count 15.3, hemoglobin 14.3, hematocrit 43.5, platelet count 357 Cervical spine CT shows no acute fracture or traumatic malalignment of the cervical spine. CT scan of the head without contrast shows atrophy and chronic small vessel ischemic changes. No acute intracranial process identified. Twelve-lead EKG reviewed by me shows sinus rhythm with first-degree AV block There are no new results to review at this time.  Assessment and Plan: * Multifocal pneumonia Patient presents for evaluation of worsening shortness of breath treated with a cough and was noted to be hypoxic with room air pulse oximetry of 88% that has improved with oxygen supplementation at 2 L Imaging is consistent with multifocal pneumonia We will place patient empirically on Rocephin and Zithromax Speech therapy consult for swallow function evaluation Obtain urine Legionella antigen Continue oxygen supplementation to maintain pulse oximetry greater than 98%  Acute metabolic encephalopathy Secondary to underlying pneumonia At baseline is usually oriented to person, place and time but is currently oriented only to person and place which according to his wife is unusual and only started in the last 1 week Expect improvement in patient's mental status following resolution of acute illness  Falls Secondary to acute illness We will place patient on fall precautions PT evaluation once appropriate  Hiatal hernia with GERD Continue PPI  HTN (hypertension) Patient is normotensive Monitor closely during this hospitalization      Advance Care Planning:   Code Status: DNR   Consults: Speech therapy  Family Communication: Greater than  50% of time was spent discussing patient's condition and plan of care with his wife at the bedside.  All questions and concerns have been addressed.  She verbalizes understanding and agrees with the plan.  CODE STATUS was discussed and patient is a DO NOT RESUSCITATE  Severity of Illness: The appropriate patient status for this patient is INPATIENT. Inpatient status is judged to be reasonable and necessary in order to provide the required intensity of service to ensure the patient's safety. The patient's presenting symptoms, physical exam findings, and  initial radiographic and laboratory data in the context of their chronic comorbidities is felt to place them at high risk for further clinical deterioration. Furthermore, it is not anticipated that the patient will be medically stable for discharge from the hospital within 2 midnights of admission.   * I certify that at the point of admission it is my clinical judgment that the patient will require inpatient hospital care spanning beyond 2 midnights from the point of admission due to high intensity of service, high risk for further deterioration and high frequency of surveillance required.*  Author: Collier Bullock, MD 05/24/2022 3:01 PM  For on call review [email or web address].

## 2022-05-24 NOTE — Progress Notes (Signed)
Requested Lactate to be drawn.

## 2022-05-24 NOTE — ED Notes (Signed)
Assisted pt to use the bedside urinal. When pt returned to bed it was noticed that he had an episode of diarrhea. With the help of Georgie, RN provided pericare, a linen change, and added a chuck pad onto the bed.

## 2022-05-25 DIAGNOSIS — J9601 Acute respiratory failure with hypoxia: Secondary | ICD-10-CM | POA: Diagnosis not present

## 2022-05-25 DIAGNOSIS — G9341 Metabolic encephalopathy: Secondary | ICD-10-CM | POA: Diagnosis not present

## 2022-05-25 DIAGNOSIS — J189 Pneumonia, unspecified organism: Secondary | ICD-10-CM | POA: Diagnosis not present

## 2022-05-25 DIAGNOSIS — E871 Hypo-osmolality and hyponatremia: Secondary | ICD-10-CM

## 2022-05-25 HISTORY — DX: Acute respiratory failure with hypoxia: J96.01

## 2022-05-25 HISTORY — DX: Hypo-osmolality and hyponatremia: E87.1

## 2022-05-25 LAB — BASIC METABOLIC PANEL
Anion gap: 9 (ref 5–15)
BUN: 20 mg/dL (ref 8–23)
CO2: 21 mmol/L — ABNORMAL LOW (ref 22–32)
Calcium: 7.9 mg/dL — ABNORMAL LOW (ref 8.9–10.3)
Chloride: 103 mmol/L (ref 98–111)
Creatinine, Ser: 1.08 mg/dL (ref 0.61–1.24)
GFR, Estimated: >60 mL/min (ref >60)
Glucose, Bld: 104 mg/dL — ABNORMAL HIGH (ref 70–99)
Potassium: 3.6 mmol/L (ref 3.5–5.1)
Sodium: 133 mmol/L — ABNORMAL LOW (ref 135–145)

## 2022-05-25 LAB — RESPIRATORY PANEL BY PCR

## 2022-05-25 LAB — BRAIN NATRIURETIC PEPTIDE: B Natriuretic Peptide: 171.6 pg/mL — ABNORMAL HIGH (ref 0.0–100.0)

## 2022-05-25 LAB — CBC
HCT: 35.7 % — ABNORMAL LOW (ref 39.0–52.0)
Hemoglobin: 11.9 g/dL — ABNORMAL LOW (ref 13.0–17.0)
MCH: 31.2 pg (ref 26.0–34.0)
MCHC: 33.3 g/dL (ref 30.0–36.0)
MCV: 93.5 fL (ref 80.0–100.0)
Platelets: 340 10*3/uL (ref 150–400)
RBC: 3.82 MIL/uL — ABNORMAL LOW (ref 4.22–5.81)
RDW: 12.7 % (ref 11.5–15.5)
WBC: 13.3 10*3/uL — ABNORMAL HIGH (ref 4.0–10.5)
nRBC: 0 % (ref 0.0–0.2)

## 2022-05-25 LAB — LEGIONELLA PNEUMOPHILA SEROGP 1 UR AG: L. pneumophila Serogp 1 Ur Ag: NEGATIVE

## 2022-05-25 LAB — PROCALCITONIN: Procalcitonin: 0.96 ng/mL

## 2022-05-25 MED ORDER — SODIUM BICARBONATE 650 MG PO TABS
650.0000 mg | ORAL_TABLET | Freq: Two times a day (BID) | ORAL | Status: DC
  Administered 2022-05-25 – 2022-05-26 (×3): 650 mg via ORAL
  Filled 2022-05-25 (×3): qty 1

## 2022-05-25 NOTE — Progress Notes (Signed)
Patient is not able to walk the distance required to go the bathroom, or he/she is unable to safely negotiate stairs required to access the bathroom.  A 3in1 BSC will alleviate this problem  

## 2022-05-25 NOTE — Hospital Course (Signed)
Clayton [name] is a 88 y.o. male with medical history significant for hypertension, traumatic brain injury, history of DVT who was brought into the ER by EMS for evaluation of feeling unwell.  The patient today presents with upper respiratory symptoms with diarrhea 5 days ago.  Then developed short of breath, cough, he also had significant confusion and weakness, had falls x 4 at home. Chest CT scan showed multifocal pneumonia, patient oxygen saturation was 88%, improved after giving 2 L oxygen. He is treated with Rocephin and Zithromax.

## 2022-05-25 NOTE — Progress Notes (Signed)
Pt continues to be disoriented , removing telemetry multiple times as well as his IV. Pt states that he can't sleep with it on and is not going to put the telemetry back on tonight. Morton Amy made aware of pt refusal .

## 2022-05-25 NOTE — TOC Initial Note (Signed)
Transition of Care [facility]) - Initial/Assessment Note    Patient Details  Name: Clayton [name] MRN: [id number] Date of Birth: [birth year]  [facility]) CM/SW Contact:    Beverly Sessions, RN Phone Number: 05/25/2022, 3:00 PM  Clinical Narrative:                  Admitted for: Multifocal pneumonia  Admitted from: Home with wife.  PCP: Ronnald Ramp Current home health/prior home health/DME: NA  Patient with intermittent confusion. Assessment completed with wife Horris Latino. Therapy recommending home health.  Horris Latino in agreement and states she does not have a preference of agency.  Referral made to [facility] with [facility].  Therapy recommending RW and shower seat.  Horris Latino in agreement.  Referral made to [facility] with [employer] for RW and BSC. To be delivered to room prior to discharge Patient currently on RA          Patient Goals and CMS Choice            Expected Discharge Plan and Services                                              Prior Living Arrangements/Services                       Activities of Daily Living Home Assistive Devices/Equipment: Eyeglasses ADL Screening (condition at time of admission) Patient's cognitive ability adequate to safely complete daily activities?: Yes Is the patient deaf or have difficulty hearing?: No Does the patient have difficulty seeing, even when wearing glasses/contacts?: No Does the patient have difficulty concentrating, remembering, or making decisions?: No Patient able to express need for assistance with ADLs?: Yes Does the patient have difficulty dressing or bathing?: No Independently performs ADLs?: Yes (appropriate for developmental age) Does the patient have difficulty walking or climbing stairs?: Yes Weakness of Legs: Both Weakness of Arms/Hands: None  Permission Sought/Granted                  Emotional Assessment              Admission diagnosis:  CAP (community acquired pneumonia)  [J18.9] Abnormal CT scan of lung [R91.8] Multifocal pneumonia [J18.9] Pneumonia due to infectious organism, unspecified laterality, unspecified part of lung [J18.9] Community acquired pneumonia, unspecified laterality [J18.9] Patient Active Problem List   Diagnosis Date Noted   Hyponatremia 05/25/2022   Acute hypoxemic respiratory failure ([place]) 05/25/2022   Multifocal pneumonia 75/91/6384   Acute metabolic encephalopathy 66/59/9357   Falls 05/24/2022   Acute left-sided low back pain with left-sided sciatica 02/25/2020   Lumbar stenosis with neurogenic claudication 02/25/2020   Symptomatic anemia 09/03/2018   GI bleed 09/02/2018   Diverticulosis of colon with hemorrhage    Acute gastrointestinal hemorrhage    Hiatal hernia with GERD    Stomach irritation    Diverticulosis of small intestine without hemorrhage    HTN (hypertension) 08/30/2018   Blood in stool 08/30/2018   Deep vein thrombosis (DVT) of proximal vein of right lower extremity ([place]) 06/14/2017   PCP:  Juline Patch, MD Pharmacy:   [id number] [place], [place] [address] [id number]: [phone]: [phone]    Social Determinants of Health (SDOH) Social History: SDOH Screenings   Food Insecurity: No Food Insecurity (  05/24/2022)  Housing: Low Risk  (05/24/2022)  Transportation Needs: No Transportation Needs (05/24/2022)  Utilities: Not At Risk (05/24/2022)  Alcohol Screen: Low Risk  (10/27/2021)  Depression (PHQ2-9): Low Risk  (11/30/2021)  Financial Resource Strain: Low Risk  (10/27/2021)  Physical Activity: Sufficiently Active (10/27/2021)  Social Connections: Moderately Integrated (10/27/2021)  Stress: No Stress Concern Present (10/27/2021)  Tobacco Use: Medium Risk (05/24/2022)   SDOH Interventions:     Readmission Risk Interventions     No data to display

## 2022-05-25 NOTE — Evaluation (Addendum)
Clinical/Bedside Swallow Evaluation Patient Details  Name: DIANNA DESHLER MRN: [id number] Date of Birth: 03/26/1935  Today's Date: 05/25/2022 Time: SLP Start Time (ACUTE ONLY): 0945 SLP Stop Time (ACUTE ONLY): 1045 SLP Time Calculation (min) (ACUTE ONLY): 60 min  Past Medical History:  Past Medical History:  Diagnosis Date   Basal cell carcinoma    Blood transfusion without reported diagnosis 62 (age 88)   After traumatic brain injury   Broken ribs    2 BROKEN ON RIBS   CKD (chronic kidney disease)    PT DENIES   DVT (deep venous thrombosis) (HCC)    RIGHT LEG   Fracture dislocation of wrist    RIGHT   Hiatal hernia with GERD    HTN (hypertension)    Symptomatic anemia 09/03/2018   Past Surgical History:  Past Surgical History:  Procedure Laterality Date   COLONOSCOPY  2014   cleared for 5 yrs   COLONOSCOPY N/A 09/01/2018   Procedure: COLONOSCOPY;  Surgeon: Virgel Manifold, MD;  Location: [facility] ENDOSCOPY;  Service: Endoscopy;  Laterality: N/A;   CRANIECTOMY FOR DEPRESSED SKULL FRACTURE  1960'S   ESOPHAGOGASTRODUODENOSCOPY N/A 09/01/2018   Procedure: ESOPHAGOGASTRODUODENOSCOPY (EGD);  Surgeon: Virgel Manifold, MD;  Location: [facility] ENDOSCOPY;  Service: Endoscopy;  Laterality: N/A;   HERNIA REPAIR     X2   IVC FILTER REMOVAL N/A 07/03/2017   Procedure: IVC FILTER REMOVAL;  Surgeon: Algernon Huxley, MD;  Location: [place] [facility];  Service: Cardiovascular;  Laterality: N/A;   LUMBAR LAMINECTOMY/DECOMPRESSION MICRODISCECTOMY N/A 04/20/2020   Procedure: L2/3, L3/4 LAMINECTOMY;  Surgeon: [name], MD;  Location: [facility] ORS;  Service: Neurosurgery;  Laterality: N/A;  2ND CASE   PERIPHERAL VASCULAR THROMBECTOMY Right 05/22/2017   Procedure: PERIPHERAL VASCULAR THROMBECTOMY;  Surgeon: Algernon Huxley, MD;  Location: [place] [facility];  Service: Cardiovascular;  Laterality: Right;   SPINE SURGERY  December 2021   TONSILLECTOMY     HPI:  Per H&P, pt s a 88 y.o.  male with medical history significant for hypertension, traumatic brain injury, history of DVT who was brought into the ER by EMS for evaluation of feeling unwell. Patient's wife states that his symptoms started a week prior to presentation and started with a cough.  She also had a cough and assumed he had an upper respiratory tract infection.  She states that symptoms improved but he continued to get worse and developed shortness of breath, confusion and a fever and so she took him to an urgent care on 05/21/22 where he was diagnosed with a pneumonia and sent home with prescriptions for antibiotic therapy which included amoxicillin and Zithromax. She drove him home from the urgent care center and states that when they arrived home while trying to get out of the car he fell landing on the concrete.  He also became incontinent of loose watery stools and that lasted for about 12 hours and resolved.  She notes that his oral intake has been very poor, he has been very weak and confused.  According to his wife he has had several falls since 05/21/22 without any loss of consciousness.  She contacted his primary care provider who advised her to bring him to the emergency room for further evaluation. At baseline patient does not require an assist device for ambulation.  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 He denies having any chest pain, no  abdominal pain, no headache, no dizziness, no lightheadedness, no leg swelling, no blurred vision no focal deficit.  Abnormal labs include sodium 133, white count of 15.3  Respiratory viral panel is negative  Chest x-ray reviewed by me shows new patchy bilateral airspace opacities, suspicious for multifocal pneumonia.    CT angiogram of the chest is negative for PE but shows large hiatal hernia with wall thickening along the thoracic esophagus. Multifocal areas of interstitial septal thickening and ground-glass type  pulmonary opacities particularly in the lower lobes, dependent right upper lobe, lingula.    Assessment / Plan / Recommendation  Clinical Impression  Pt seen today for BSE. Pt appeared alert and cooperative throughout eval. Pt is HOH at baseline and does not have hearing aids present (closing gap, looking at listener, and increased volume utilized). Pt's wife present for duration of eval. Pt took several bites of breakfast before ST arrival. Pt's wife reported pt's loss of taste beginning few years ago which has lead to a lack of interest/desire to eat/drink, even currently. Noted baseline intermittent, Dry cough at rest PRIOR TO po's given. Noted pt's history of Large Hiatal Hernia. Pt reported food/drink go down "slower than they used to" but denied any overt swallowing problems/issues w/ his diet at home - pt eats a regular diet at home per his report. Pt left sitting up in bed with wife and PT present, call button in reach, and bed alarm set.   Pt on 2L O2 via ; afebrile; elevated WBC.   Pt does not appear to demonstrate overt s/s of oropahryngeal dysphagia; oropharyngeal swallowing appeared functional. Administered trials of thin liquids (~4-5 oz), puree (~2 oz), and solid (half graham cracker). During oral phase, Pt demonstrated good bolus control/cohesion, timely/strong mastication, clear oral cavity post-swallow, and timely A/P bolus transit. During pharyngeal phase, pt exhbited seemingly adequate hyolaryngeal elevation, seemingly timely pharyngeal swallow, and clear vocal quality post trials. Pt coughed intermittently (~2 times during trials but not immediately to completion of swallow). Cough consistent with baseline cough noted prior to administration of po's.  Pt has baseline of large hiatal hernia with wall thickening along the thoracic esophagus per CT scan/imaging. Suspect pt's dry cough could be from impact of esophageal phase dysmotility. Pt/wife were educated on the impact of esophageal  phase dysmotility and its impact on oropharyngeal swallowing. ANY esophageal dysmotility can increase risk for retrograde activity of food/liquid thus aspiration of Refluxed material. ST provided education to pt/wife on general aspiration/Reflux precautions and general education/strategies for GERD. MD updated also.  Recommend continue a fairly regular diet (foods cut small and moistened) w/ thin liquids via cup, monitor straw use. Recommend medications given whole with liquids as tolerated vs whole in puree for ease of esophageal clearing as desired. Recommend eating/drink with general aspiration precautions and reflux precautions (eating slowly, small sips/bites, sitting upright during meals, taking breaks between bites/sips).   Post discharge home, recommend consultation with dietician/PCP to address pt's decreased motivation to eat/drink, loss of taste, and options to support nutrition in diet. No acute skilled ST services needed at this time. MD to reconsult if needs arise during admission. Pt/wife agreed w/ above. MD/Nursing updated. SLP Visit Diagnosis: Dysphagia, unspecified (R13.10)    Aspiration Risk  Mild aspiration risk (d/t history of reflux)    Diet Recommendation   Recommend regular solids (foods cut small and moistened) w/ thin liquids via cup, monitor straw use. Recommend medications given whole with liquids as tolerated. Recommend eating/drink with general aspiration precautions and  reflux precautions (eating slowly, small sips/bites, sitting upright during meals, taking breaks between bites/sips).   Medication Administration: Whole meds with liquid (As tolerated) vs whole in puree if desired.   Other  Recommendations Recommended Consults: Consider GI evaluation (Dietition, PCP) Oral Care Recommendations: Oral care QID;Oral care before and after PO;Patient independent with oral care    Recommendations for follow up therapy are one component of a multi-disciplinary discharge  planning process, led by the attending physician.  Recommendations may be updated based on patient status, additional functional criteria and insurance authorization.  Follow up Recommendations No SLP follow up      Assistance Recommended at Discharge  PRN  Functional Status Assessment Patient has had a recent decline in their functional status and demonstrates the ability to make significant improvements in function in a reasonable and predictable amount of time.  Frequency and Duration  N/a          Prognosis Prognosis for Safe Diet Advancement: Good Barriers to Reach Goals: Motivation;Severity of deficits (Deficits include loss of taste and esophageal issues)      Swallow Study   General Date of Onset: 05/24/22 HPI: Per H&P, pt s a 88 y.o. male with medical history significant for hypertension, traumatic brain injury, history of DVT who was brought into the ER by EMS for evaluation of feeling unwell. Patient's wife states that his symptoms started a week prior to presentation and started with a cough.  She also had a cough and assumed he had an upper respiratory tract infection.  She states that symptoms improved but he continued to get worse and developed shortness of breath, confusion and a fever and so she took him to an urgent care on 05/21/22 where he was diagnosed with a pneumonia and sent home with prescriptions for antibiotic therapy which included amoxicillin and Zithromax. She drove him home from the urgent care center and states that when they arrived home while trying to get out of the car he fell landing on the concrete.  He also became incontinent of loose watery stools and that lasted for about 12 hours and resolved.  She notes that his oral intake has been very poor, he has been very weak and confused.  According to his wife he has had several falls since 05/21/22 without any loss of consciousness.  She contacted his primary care provider who advised her to bring him to the  emergency room for further evaluation. At baseline patient does not require an assist device for ambulation.  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 He denies having any chest pain, no abdominal pain, no headache, no dizziness, no lightheadedness, no leg swelling, no blurred vision no focal deficit.  Abnormal labs include sodium 133, white count of 15.3  Respiratory viral panel is negative  Chest x-ray reviewed by me shows new patchy bilateral airspace opacities, suspicious for multifocal pneumonia.  CT angiogram of the chest is negative for PE but shows large hiatal hernia with wall thickening along the thoracic esophagus. Multifocal areas of interstitial septal thickening and ground-glass type pulmonary opacities particularly in the lower lobes, dependent right upper lobe, lingula. Type of Study: Bedside Swallow Evaluation Previous Swallow Assessment: N/A Diet Prior to this Study: Regular;Thin liquids Temperature Spikes Noted: No (WBC 13.3) Respiratory Status: Nasal cannula (2L) History of Recent Intubation: No Behavior/Cognition: Alert;Cooperative;Pleasant mood Oral Cavity Assessment: Within Functional Limits Oral Care Completed by SLP:  No Oral Cavity - Dentition: Adequate natural dentition;Missing dentition Vision: Functional for self-feeding Self-Feeding Abilities: Able to feed self Patient Positioning: Upright in bed Baseline Vocal Quality: Normal Volitional Swallow: Able to elicit    Oral/Motor/Sensory Function Overall Oral Motor/Sensory Function: Within functional limits   Ice Chips Ice chips: Not tested   Thin Liquid Thin Liquid: Within functional limits Presentation: Cup;Self Fed (~ 4-5 oz)    Nectar Thick Nectar Thick Liquid: Not tested   Honey Thick Honey Thick Liquid: Not tested   Puree Puree: Within functional limits Presentation: Self Fed;Spoon (~2 oz)   Solid     Solid: Within functional  limits Presentation: Self Fed (Half graham cracker)       Randall Hiss Graduate Clinician [place], Speech Pathology  Randall Hiss 05/25/2022,12:19 PM    The information in this patient note, response to treatment, and overall treatment plan developed has been reviewed and agreed upon after reviewing documentation. This session was performed under the supervision of this licensed clinician.   Orinda Kenner, [place], [place] [phone] 05/25/2022; 1625pm

## 2022-05-25 NOTE — Evaluation (Signed)
Physical Therapy Evaluation Patient Details Name: Clayton [name] MRN: [id number] DOB: 1934/07/23 Today's Date: 05/25/2022  History of Present Illness  Patient is a 88 year old male presenting with shortness of breath, weakness, falls. Past medical history significant for HTN, traumatic brain injury, DVT. CT angiogram of the chest is negative for PE. Found to have multifocal pneumonia, acute metabolic encephalopathy.  Clinical Impression  Patient is agreeable to PT evaluation with spouse at the bedside. Spouse reports patient was totally independent prior to one week ago, but has had at least 4 falls over the past week at home.  Today, the patient was able to get up from the bed with Min guard assistance. With cues for proper rolling walker use, patient ambulated in the room with Min guard for safety. I would recommend to continue using rolling walker for fall prevention at this time and the spouse is requesting one for home. He did require lifting assistance to stand from the toilet but was able to complete peri-care following a bowel movement with set-up assistance from therapist. Recommend to continue PT to maximize independence and to decrease caregiver burden.      Recommendations for follow up therapy are one component of a multi-disciplinary discharge planning process, led by the attending physician.  Recommendations may be updated based on patient status, additional functional criteria and insurance authorization.  Follow Up Recommendations Home health PT      Assistance Recommended at Discharge Intermittent Supervision/Assistance  Patient can return home with the following  A little help with walking and/or transfers;Help with stairs or ramp for entrance;Assist for transportation;Assistance with cooking/housework    Equipment Recommendations Rolling walker (2 wheels)  Recommendations for Other Services       Functional Status Assessment Patient has had a recent decline in their  functional status and demonstrates the ability to make significant improvements in function in a reasonable and predictable amount of time.     Precautions / Restrictions Precautions Precautions: Fall Restrictions Weight Bearing Restrictions: No      Mobility  Bed Mobility Overal bed mobility: Needs Assistance Bed Mobility: Supine to Sit, Sit to Supine     Supine to sit: Min guard Sit to supine: Min guard   General bed mobility comments: increased time and effort required. no dizziness reported with upright activity    Transfers Overall transfer level: Needs assistance Equipment used: Rolling walker (2 wheels) Transfers: Sit to/from Stand Sit to Stand: Min assist, Min guard           General transfer comment: Min gaurd for standing from the bed. Min A for lifting assistance to stand from toilet. verbal cues for hand placement for safety    Ambulation/Gait Ambulation/Gait assistance: Min guard Gait Distance (Feet): 30 Feet Assistive device: Rolling walker (2 wheels) Gait Pattern/deviations: Step-through pattern, Decreased stride length, Trunk flexed Gait velocity: decreased     General Gait Details: cues to remain closer to rolling walker for support. Min guard assistance provided for safety  Stairs            Wheelchair Mobility    Modified Rankin (Stroke Patients Only)       Balance Overall balance assessment: Needs assistance, History of Falls Sitting-balance support: Feet supported Sitting balance-Leahy Scale: Good Sitting balance - Comments: patient able to weight shft for toileting without loss of balance   Standing balance support: No upper extremity supported, During functional activity Standing balance-Leahy Scale: Fair Standing balance comment: patient able to stand to wash hands without  UE support with Min guard for safety                             Pertinent Vitals/Pain Pain Assessment Pain Assessment: No/denies pain     Home Living Family/patient expects to be discharged to:: Private residence Living Arrangements: Spouse/significant other Available Help at Discharge: Family;Available 24 hours/day Type of Home: House Home Access: Stairs to enter   [employer] of Steps: 6 or 8   Home Layout: One level   Additional Comments: 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 has had at least 4 falls over the past week ADLs Comments: independent prior to 1 week ago     Hand Dominance        Extremity/Trunk Assessment   Upper Extremity Assessment Upper Extremity Assessment: Defer to OT evaluation    Lower Extremity Assessment Lower Extremity Assessment: Generalized weakness       Communication   Communication: HOH  Cognition Arousal/Alertness: Awake/alert Behavior During Therapy: Flat affect Overall Cognitive Status: Within Functional Limits for tasks assessed                                          General Comments General comments (skin integrity, edema, etc.): spouse requested a gait belt for home use which was provided. she also is requesting a rolling walker for home use    Exercises     Assessment/Plan    PT Assessment Patient needs continued PT services  PT Problem List Decreased strength;Decreased range of motion;Decreased activity tolerance;Decreased balance;Decreased mobility;Decreased knowledge of use of DME       PT Treatment Interventions DME instruction;Gait training;Stair training;Functional mobility training;Therapeutic activities;Therapeutic exercise;Balance training;Neuromuscular re-education;Cognitive remediation;Patient/family education    PT Goals (Current goals can be found in the Care Plan section)  Acute Rehab PT Goals Patient Stated  Goal: to return home PT Goal Formulation: With patient Time For Goal Achievement: 06/08/22 Potential to Achieve Goals: Fair    Frequency Min 2X/week     Co-evaluation               AM-PAC PT "6 Clicks" Mobility  Outcome Measure Help needed turning from your back to your side while in a flat bed without using bedrails?: A Little Help needed moving from lying on your back to sitting on the side of a flat bed without using bedrails?: A Little Help needed moving to and from a bed to a chair (including a wheelchair)?: A Little Help needed standing up from a chair using your arms (e.g., wheelchair or bedside chair)?: A Little Help needed to walk in hospital room?: A Little Help needed climbing 3-5 steps with a railing? : A Lot 6 Click Score: 17    End of Session Equipment Utilized During Treatment: Gait belt Activity Tolerance: Patient tolerated treatment well Patient left: in bed;with call bell/phone within reach;with bed alarm set Nurse Communication: Mobility status PT Visit Diagnosis: Muscle weakness (generalized) (M62.81);Unsteadiness on feet (R26.81)    Time: 9449-6759 PT Time Calculation (min) (ACUTE ONLY): 25 min   Charges:   PT Evaluation $PT Eval Low Complexity: 1 Low PT  Treatments $Therapeutic Activity: 8-22 mins        Minna Merritts, PT, MPT   Percell Locus 05/25/2022, 12:51 PM

## 2022-05-25 NOTE — Progress Notes (Signed)
  Progress Note   Patient: Clayton [name] GXQ:[id number] DOB: 1935/05/02 DOA: 05/24/2022     1 DOS: the patient was seen and examined on 05/25/2022   Brief hospital course: ALBARO DEVINEY is a 88 y.o. male with medical history significant for hypertension, traumatic brain injury, history of DVT who was brought into the ER by EMS for evaluation of feeling unwell.  The patient today presents with upper respiratory symptoms with diarrhea 5 days ago.  Then developed short of breath, cough, he also had significant confusion and weakness, had falls x 4 at home. Chest CT scan showed multifocal pneumonia, patient oxygen saturation was 88%, improved after giving 2 L oxygen. He is treated with Rocephin and Zithromax.  Assessment and Plan: Multifocal pneumonia. Acute hypoxemic respiratory failure secondary to pneumonia. Patient oxygenation is better, currently off oxygen. I personally reviewed patient's CT chest results, independently reviewed CT scan images.  Patient has multiple groundglass consolidations, COVID and influenza negative.  Procalcitonin level mildly elevated at 0.96.  Condition more consistent with atypical pneumonia.  Will continue Rocephin and Zithromax.  Also check virus panel. No significant elevation of BNP, no evidence of congestive heart failure exacerbation. Patient also seen by speech therapy, no significant risk for aspiration.  Acute metabolic encephalopathy secondary to pneumonia. Weakness and falls secondary to pneumonia. Patient still has some confusion, but overall improving.  Continue to treat pneumonia, obtain PT/OT.  Hyponatremia. Mild metabolic acidosis. This is probably due to recent diarrhea, started lower dose sodium bicarb orally. Will also check urine Legionella antigen to rule out Legionella pneumonia.  Essential hypertension. Continue to monitor.  Blood pressures not elevated significantly.     Subjective:  Patient still has some occasional  confusion with weakness.  Does not have any short of breath.  Has a cough nonproductive. No fever or chills.  No additional diarrhea.  Physical Exam: Vitals:   05/24/22 1730 05/24/22 1800 05/24/22 1913 05/25/22 0406  BP: 135/76 129/74 (!) 141/85 (!) 124/58  Pulse: 70 70 73 82  Resp: (!) 34 (!) 32 (!) 24 18  Temp:   97.7 F (36.5 C) 99.8 F (37.7 C)  TempSrc:   Oral Oral  SpO2: 97% 95% 93% 94%  Weight:      Height:       General exam: Appears calm and comfortable  Respiratory system: Right lower lobe crackles. Respiratory effort normal. Cardiovascular system: S1 & S2 heard, RRR. No JVD, murmurs, rubs, gallops or clicks. No pedal edema. Gastrointestinal system: Abdomen is nondistended, soft and nontender. No organomegaly or masses felt. Normal bowel sounds heard. Central nervous system: Alert and oriented. No focal neurological deficits. Extremities: Symmetric 5 x 5 power. Skin: No rashes, lesions or ulcers Psychiatry: Judgement and insight appear normal. Mood & affect appropriate.   Data Reviewed:  Reviewed chest x-ray, CT scan results, independently reviewed CT scan images.  Reviewed all lab results.  Family Communication: Wife updated at bedside.  Disposition: Status is: Inpatient Remains inpatient appropriate because: Severity of disease, IV treatment.  Planned Discharge Destination: Home with Home Health    Time spent: 50 minutes  Author: Sharen Hones, MD 05/25/2022 12:43 PM  For on call review [email or web address].

## 2022-05-25 NOTE — Evaluation (Signed)
Occupational Therapy Evaluation Patient Details Name: Clayton [name] MRN: [id number] DOB: Dec 19, 1934 Today's Date: 05/25/2022   History of Present Illness Pt is an 88 year old male admitted with Multifocal pneumonia, acute encephalopathy, multiple falls at home; PMH significant forhypertension, traumatic brain injury, history of DVT   Clinical Impression   Chart reviewed, pt greeted in room with wife present throughout. Pt is alert, disoriented to date. PTA pt wife reports pt MOD I with ADL/IADL, recent cognitive decline worsening in the last week with multiple falls. Pt presents with deficits in strength, endurance, activity tolerance, cognition affecting safe and optimal ADL completion. Pt has poor safety awareness throughout. CGA with RW required for mobility in room, transfer to toilet. Pt requires cueing for two step directions. SLUMS completed, please see below. Discussed with wife recommendation for frequent/constant supervision for mobility and medication management, cooking. Pt is left as received, all needs met. OT will follow acutely.      Recommendations for follow up therapy are one component of a multi-disciplinary discharge planning process, led by the attending physician.  Recommendations may be updated based on patient status, additional functional criteria and insurance authorization.   Follow Up Recommendations  Home health OT     Assistance Recommended at Discharge Frequent or constant Supervision/Assistance  Patient can return home with the following A little help with bathing/dressing/bathroom;Assistance with cooking/housework;Direct supervision/assist for financial management;Direct supervision/assist for medications management;Assist for transportation;Help with stairs or ramp for entrance    Functional Status Assessment  Patient has had a recent decline in their functional status and demonstrates the ability to make significant improvements in function in a  reasonable and predictable amount of time.  Equipment Recommendations  Tub/shower seat    Recommendations for Other Services       Precautions / Restrictions Precautions Precautions: Fall Restrictions Weight Bearing Restrictions: No      Mobility Bed Mobility Overal bed mobility: Needs Assistance Bed Mobility: Supine to Sit, Sit to Supine     Supine to sit: Supervision Sit to supine: Supervision        Transfers Overall transfer level: Needs assistance Equipment used: Rolling walker (2 wheels) Transfers: Sit to/from Stand Sit to Stand: Min guard                  Balance Overall balance assessment: Needs assistance, History of Falls Sitting-balance support: Feet supported Sitting balance-Leahy Scale: Good     Standing balance support: No upper extremity supported, During functional activity Standing balance-Leahy Scale: Fair                             ADL either performed or assessed with clinical judgement   ADL Overall ADL's : Needs assistance/impaired Eating/Feeding: Set up;Sitting   Grooming: Wash/dry hands;Standing;Cueing for safety;Cueing for sequencing Grooming Details (indicate cue type and reason): sink level with RW             Lower Body Dressing: Supervision/safety   Toilet Transfer: Supervision/safety;Rolling walker (2 wheels);Regular Toilet;Cueing for safety;Min guard   Toileting- Clothing Manipulation and Hygiene: Sit to/from stand;Supervision/safety       Functional mobility during ADLs: Min guard;Rolling walker (2 wheels)       Vision Patient Visual Report: No change from baseline       Perception     Praxis      Pertinent Vitals/Pain Pain Assessment Pain Assessment: No/denies pain     Hand Dominance     Extremity/Trunk  Assessment Upper Extremity Assessment Upper Extremity Assessment: Generalized weakness   Lower Extremity Assessment Lower Extremity Assessment: Defer to PT evaluation        Communication Communication Communication: HOH (hearing aids are currently being fixed)   Cognition Arousal/Alertness: Awake/alert Behavior During Therapy: Flat affect Overall Cognitive Status: Impaired/Different from baseline Area of Impairment: Orientation, Memory, Following commands, Safety/judgement, Awareness, Problem solving                 Orientation Level: Disoriented to, Time   Memory: Decreased short-term memory Following Commands: Follows one step commands with increased time Safety/Judgement: Decreased awareness of safety, Decreased awareness of deficits Awareness: Intellectual Problem Solving: Slow processing, Requires verbal cues, Requires tactile cues General Comments: 6/30 on SLUMS assessment with deficits noted in immediate recall, deyaled recall, numeric calculation, registration and digit span, visual spatial and executive function, executiev function plus extrapolation     General Comments  spouse requested a gait belt for home use which was provided. she also is requesting a rolling walker for home use    Exercises Other Exercises Other Exercises: edu pt and spouse re: role of OT, role of rehab, discharge recommendations, home safety, falls prevention, med management, safe ADL completion   Shoulder Instructions      Home Living Family/patient expects to be discharged to:: Private residence Living Arrangements: Spouse/significant other Available Help at Discharge: Family;Available 24 hours/day Type of Home: House Home Access: Stairs to enter [employer] of Steps: 6 or 8   Home Layout: One level     Bathroom Shower/Tub: Walk-in shower         Home Equipment: None   Additional Comments: 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)      Prior Functioning/Environment Prior Level of Function : Independent/Modified Independent;History of Falls (last six months)             Mobility Comments: amb with no AD, fall history ADLs  Comments: MOD I-I in ADL PTA, pt wife reports worsening cognition over the last week        OT Problem List: Decreased strength;Decreased activity tolerance;Impaired balance (sitting and/or standing);Decreased safety awareness;Decreased cognition;Decreased knowledge of precautions;Decreased knowledge of use of DME or AE      OT Treatment/Interventions: Self-care/ADL training;Patient/family education;Therapeutic exercise;Balance training;Therapeutic activities;DME and/or AE instruction    OT Goals(Current goals can be found in the care plan section) Acute Rehab OT Goals Patient Stated Goal: return to PLOF OT Goal Formulation: With patient Time For Goal Achievement: 06/08/22 Potential to Achieve Goals: Good  OT Frequency: Min 2X/week    Co-evaluation              AM-PAC OT "6 Clicks" Daily Activity     Outcome Measure Help from another person eating meals?: None Help from another person taking care of personal grooming?: None Help from another person toileting, which includes using toliet, bedpan, or urinal?: A Little Help from another person bathing (including washing, rinsing, drying)?: A Little Help from another person to put on and taking off regular upper body clothing?: None Help from another person to put on and taking off regular lower body clothing?: A Little 6 Click Score: 21   End of Session Equipment Utilized During Treatment: Rolling walker (2 wheels) Nurse Communication: Mobility status  Activity Tolerance: Patient tolerated treatment well Patient left: in bed;with call bell/phone within reach;with bed alarm set;with family/visitor present  OT Visit Diagnosis: Unsteadiness on feet (R26.81);History of falling (Z91.81)  Time: 1129-1207 OT Time Calculation (min): 38 min Charges:  OT General Charges $OT Visit: 1 Visit OT Evaluation $OT Eval Moderate Complexity: 1 Mod OT Treatments $Cognitive Funtion inital: Initial 15 mins  Shanon Payor, OTD  OTR/L  05/25/22, 1:36 PM

## 2022-05-26 DIAGNOSIS — E876 Hypokalemia: Secondary | ICD-10-CM

## 2022-05-26 DIAGNOSIS — J189 Pneumonia, unspecified organism: Secondary | ICD-10-CM | POA: Diagnosis not present

## 2022-05-26 DIAGNOSIS — J9601 Acute respiratory failure with hypoxia: Secondary | ICD-10-CM | POA: Diagnosis not present

## 2022-05-26 DIAGNOSIS — G9341 Metabolic encephalopathy: Secondary | ICD-10-CM | POA: Diagnosis not present

## 2022-05-26 HISTORY — DX: Hypokalemia: E87.6

## 2022-05-26 LAB — CBC
HCT: 34.9 % — ABNORMAL LOW (ref 39.0–52.0)
Hemoglobin: 11.8 g/dL — ABNORMAL LOW (ref 13.0–17.0)
MCH: 31.6 pg (ref 26.0–34.0)
MCHC: 33.8 g/dL (ref 30.0–36.0)
MCV: 93.3 fL (ref 80.0–100.0)
Platelets: 375 10*3/uL (ref 150–400)
RBC: 3.74 MIL/uL — ABNORMAL LOW (ref 4.22–5.81)
RDW: 12.7 % (ref 11.5–15.5)
WBC: 10.5 10*3/uL (ref 4.0–10.5)
nRBC: 0 % (ref 0.0–0.2)

## 2022-05-26 LAB — BASIC METABOLIC PANEL
Anion gap: 9 (ref 5–15)
BUN: 18 mg/dL (ref 8–23)
CO2: 21 mmol/L — ABNORMAL LOW (ref 22–32)
Calcium: 7.8 mg/dL — ABNORMAL LOW (ref 8.9–10.3)
Chloride: 103 mmol/L (ref 98–111)
Creatinine, Ser: 0.99 mg/dL (ref 0.61–1.24)
GFR, Estimated: >60 mL/min (ref >60)
Glucose, Bld: 96 mg/dL (ref 70–99)
Potassium: 3.4 mmol/L — ABNORMAL LOW (ref 3.5–5.1)
Sodium: 133 mmol/L — ABNORMAL LOW (ref 135–145)

## 2022-05-26 LAB — LEGIONELLA PNEUMOPHILA SEROGP 1 UR AG: L. pneumophila Serogp 1 Ur Ag: NEGATIVE

## 2022-05-26 LAB — MAGNESIUM: Magnesium: 2.2 mg/dL (ref 1.7–2.4)

## 2022-05-26 MED ORDER — CEFDINIR 300 MG PO CAPS: 300.0000 mg | ORAL_CAPSULE | Freq: Two times a day (BID) | ORAL | 0 refills | Status: AC

## 2022-05-26 MED ORDER — POTASSIUM CHLORIDE CRYS ER 20 MEQ PO TBCR
40.0000 meq | EXTENDED_RELEASE_TABLET | Freq: Once | ORAL | Status: AC
  Administered 2022-05-26: 40 meq via ORAL
  Filled 2022-05-26: qty 2

## 2022-05-26 MED ORDER — AZITHROMYCIN 500 MG PO TABS: 500.0000 mg | ORAL_TABLET | Freq: Every day | ORAL | 0 refills | Status: AC

## 2022-05-26 MED ORDER — SODIUM BICARBONATE 650 MG PO TABS: 650.0000 mg | ORAL_TABLET | Freq: Two times a day (BID) | ORAL | 0 refills | Status: AC

## 2022-05-26 MED ORDER — POTASSIUM CHLORIDE CRYS ER 10 MEQ PO TBCR: 10.0000 meq | EXTENDED_RELEASE_TABLET | Freq: Two times a day (BID) | ORAL | 0 refills | Status: DC

## 2022-05-26 NOTE — Discharge Summary (Signed)
Physician Discharge Summary   Patient: Clayton [name] MRN: [id number] DOB: March 06, 1935  Admit date:     05/24/2022  Discharge date: 05/26/22  Discharge Physician: Sharen Hones   PCP: Juline Patch, MD   Recommendations at discharge:   Follow-up with PCP in 1 week.  Discharge Diagnoses: Principal Problem:   Multifocal pneumonia Active Problems:   Acute metabolic encephalopathy   HTN (hypertension)   Hiatal hernia with GERD   Falls   Hyponatremia   Acute hypoxemic respiratory failure (HCC)   Hypokalemia  Resolved Problems:   * No resolved hospital problems. *  Hospital Course: Clayton [name] is a 88 y.o. male with medical history significant for hypertension, traumatic brain injury, history of DVT who was brought into the ER by EMS for evaluation of feeling unwell.  The patient today presents with upper respiratory symptoms with diarrhea 5 days ago.  Then developed short of breath, cough, he also had significant confusion and weakness, had falls x 4 at home. Chest CT scan showed multifocal pneumonia, patient oxygen saturation was 88%, improved after giving 2 L oxygen. He is treated with Rocephin and Zithromax.  Assessment and Plan: Multifocal pneumonia. Acute hypoxemic respiratory failure secondary to pneumonia. Patient oxygenation is better, currently off oxygen. I personally reviewed patient's CT chest results, independently reviewed CT scan images.  Patient has multiple groundglass consolidations, COVID and influenza negative.  Procalcitonin level mildly elevated at 0.96.  Condition more consistent with atypical pneumonia.  Treated with Rocephin and Zithromax..  Viral panel was also negative. No significant elevation of BNP, no evidence of congestive heart failure exacerbation. Patient also seen by speech therapy, no significant risk for aspiration. He has improved, the need.  Medically stable to be discharged.  Patient also seen by PT/OT, recommend home with home  health.   Acute metabolic encephalopathy secondary to pneumonia. Weakness and falls secondary to pneumonia. Patient condition has improved.  Seen by PT/OT, recommended home health with PT/OT.   Hyponatremia. Mild metabolic acidosis. Hypokalemia. This is probably due to recent diarrhea, started lower dose sodium bicarb orally. CO2 level 21, will continue a few days Sodium bicarbonate Potassium today is 3.4, will give 40 mill equivalent of potassium orally.  Will continue 20 mEq daily x 5 days. Sodium level stable at 133.   Essential hypertension.   Blood pressures not elevated significantly.       Consultants: None Procedures performed: None  Disposition: Home health Diet recommendation:  Discharge Diet Orders (From admission, onward)     Start     Ordered   05/26/22 0000  Diet - low sodium heart healthy        05/26/22 1010           Cardiac diet DISCHARGE MEDICATION: Allergies as of 05/26/2022       Reactions   Aspirin Rash   Sulfa Antibiotics Rash        Medication List     STOP taking these medications    amoxicillin-clavulanate 875-125 MG tablet Commonly known as: AUGMENTIN       TAKE these medications    acetaminophen 500 MG tablet Commonly known as: TYLENOL Take 1,000 mg by mouth every 6 (six) hours as needed for mild pain or moderate pain.   albuterol 108 (90 Base) MCG/ACT inhaler Commonly known as: VENTOLIN HFA Inhale 2 puffs into the lungs every 4 (four) hours as needed for wheezing or shortness of breath.   azithromycin 500 MG tablet Commonly known as: Zithromax Take  1 tablet (500 mg total) by mouth daily for 3 days. Take 1 tablet daily for 3 days. What changed:  medication strength how much to take additional instructions   benzonatate 100 MG capsule Commonly known as: TESSALON Take 1 capsule (100 mg total) by mouth every 8 (eight) hours.   cefdinir 300 MG capsule Commonly known as: OMNICEF Take 1 capsule (300 mg total) by  mouth 2 (two) times daily for 4 days.   pantoprazole 40 MG tablet Commonly known as: PROTONIX Take 1 tablet (40 mg total) by mouth daily.   potassium chloride 10 MEQ tablet Commonly known as: KLOR-CON M Take 1 tablet (10 mEq total) by mouth 2 (two) times daily for 5 days.   promethazine-dextromethorphan 6.25-15 MG/5ML syrup Commonly known as: PROMETHAZINE-DM Take 2.5 mLs by mouth at bedtime as needed.   sodium bicarbonate 650 MG tablet Take 1 tablet (650 mg total) by mouth 2 (two) times daily for 5 days.               Durable Medical Equipment  (From admission, onward)           Start     Ordered   05/25/22 1446  For home use only DME Bedside commode  Once       Question:  Patient needs a bedside commode to treat with the following condition  Answer:  Weakness   05/25/22 1446   05/25/22 1255  For home use only DME Walker rolling  Once       Question Answer Comment  Walker: With [place]   Patient needs a walker to treat with the following condition Difficulty walking      05/25/22 1257            Follow-up Information     Juline Patch, MD Follow up in 1 week(s).   Specialty: Family Medicine Contact information: [address] [place] [address] [phone]                Discharge Exam: Danley Danker Weights   05/24/22 1056 05/24/22 1200  Weight: 73.5 kg 74.5 kg   General exam: Appears calm and comfortable  Respiratory system: A few crackles in the bases bilaterally. Respiratory effort normal. Cardiovascular system: S1 & S2 heard, RRR. No JVD, murmurs, rubs, gallops or clicks. No pedal edema. Gastrointestinal system: Abdomen is nondistended, soft and nontender. No organomegaly or masses felt. Normal bowel sounds heard. Central nervous system: Alert and oriented. No focal neurological deficits. Extremities: Symmetric 5 x 5 power. Skin: No rashes, lesions or ulcers Psychiatry: Judgement and insight appear normal. Mood & affect  appropriate.    Condition at discharge: good  The results of significant diagnostics from this hospitalization (including imaging, microbiology, ancillary and laboratory) are listed below for reference.   Imaging Studies: CT HEAD WO CONTRAST  Result Date: 05/24/2022 CLINICAL DATA:  Head trauma, moderate-severe EXAM: CT HEAD WITHOUT CONTRAST TECHNIQUE: Contiguous axial images were obtained from the base of the skull through the vertex without intravenous contrast. RADIATION DOSE REDUCTION: This exam was performed according to the departmental dose-optimization program which includes automated exposure control, adjustment of the mA and/or kV according to patient size and/or use of iterative reconstruction technique. COMPARISON:  01/08/2019 FINDINGS: Brain: There is periventricular white matter decreased attenuation consistent with small vessel ischemic changes. Ventricles, sulci and cisterns are prominent consistent with age related involutional changes. No acute intracranial hemorrhage, mass effect or shift. No hydrocephalus. Vascular: No hyperdense vessel or  unexpected calcification. Skull: Normal. Negative for fracture or focal lesion. Sinuses/Orbits: Orbits are unremarkable. Mucoperiosteal thickening consistent with chronic left maxillary sinusitis. IMPRESSION: Atrophy and chronic small vessel ischemic changes. No acute intracranial process identified. Electronically Signed   By: Sammie Bench M.D.   On: 05/24/2022 13:07   CT Angio Chest PE W and/or Wo Contrast  Result Date: 05/24/2022 CLINICAL DATA:  Pulmonary embolism. Cough, nasal congestion and loss of balance. Frequent falls. EXAM: CT ANGIOGRAPHY CHEST WITH CONTRAST TECHNIQUE: Multidetector CT imaging of the chest was performed using the standard protocol during bolus administration of intravenous contrast. Multiplanar CT image reconstructions and MIPs were obtained to evaluate the vascular anatomy. RADIATION DOSE REDUCTION: This exam was  performed according to the departmental dose-optimization program which includes automated exposure control, adjustment of the mA and/or kV according to patient size and/or use of iterative reconstruction technique. CONTRAST:  53m OMNIPAQUE IOHEXOL 350 MG/ML SOLN COMPARISON:  X-ray 05/24/2022 earlier and older FINDINGS: Cardiovascular: Normal cardiopericardial silhouette. No pericardial effusion. Prominent coronary artery calcifications are seen. The thoracic aorta has a bovine type aortic arch. Mild scattered atherosclerotic partially calcified plaque. No dissection or aneurysm formation. Mild plaque extends along the origin of the great vessels. Significant breathing motion. This limits evaluation of small and peripheral emboli. No segmental or larger pulmonary embolism clearly seen. There is some enlargement of the pulmonary artery particularly on the right side. Please correlate for any evidence of pulmonary artery hypertension. Mediastinum/Nodes: Large hiatal hernia with wall thickening along the thoracic esophagus. The esophagus also slightly patulous. No specific abnormal lymph node enlargement present in the axillary regions. There are some small hilar nodes. Example on the right inferiorly on image 73 of series 7 measures 13 by 10 mm. Left-sided focus on image 62 measures 11 by 8 mm. There are some small mediastinal nodes identified which overall less than a cm in short axis and not pathologic by size criteria. Slightly more numerous than usually seen. Lungs/Pleura: There are multifocal areas of interstitial septal thickening and ground-glass type pulmonary opacities particularly in the lower lobes, dependent right upper lobe, lingula. Please correlate for etiology. This could represent some crazy paving. Recommend follow-up. No pleural effusion or pneumothorax. There is diffuse breathing motion. Upper Abdomen: Right adrenal gland is preserved. The left is not included in the imaging field.  Musculoskeletal: Slight curvature of the spine with some degenerative changes. Diffuse bridging osteophytes and syndesmophytes. Review of the MIP images confirms the above findings. IMPRESSION: 1. No segmental or larger pulmonary embolism clearly seen. Significant breathing motion limits evaluation of small and peripheral emboli. 2. Enlargement of the pulmonary artery particularly on the right side. Please correlate for any evidence of pulmonary artery hypertension. 3. Large hiatal hernia with wall thickening along the thoracic esophagus. Please correlate clinically for esophagitis. 4. Multifocal areas of interstitial septal thickening and ground-glass type pulmonary opacities particularly in the lower lobes, dependent right upper lobe, lingula. Please correlate for etiology. This could represent some crazy paving. Recommend follow-up. Prominent hilar and mediastinal nodes may also be reactive recommend attention on follow-up. Aortic Atherosclerosis (ICD10-I70.0) and Emphysema (ICD10-J43.9). Electronically Signed   By: AJill SideM.D.   On: 05/24/2022 13:05   CT Cervical Spine Wo Contrast  Result Date: 05/24/2022 CLINICAL DATA:  Neck trauma, frequent falls and confusion EXAM: CT CERVICAL SPINE WITHOUT CONTRAST TECHNIQUE: Multidetector CT imaging of the cervical spine was performed without intravenous contrast. Multiplanar CT image reconstructions were also generated. RADIATION DOSE REDUCTION: This exam was performed  according to the departmental dose-optimization program which includes automated exposure control, adjustment of the mA and/or kV according to patient size and/or use of iterative reconstruction technique. COMPARISON:  01/08/2019 FINDINGS: Alignment: No listhesis. Skull base and vertebrae: No acute fracture. No primary bone lesion or focal pathologic process. Soft tissues and spinal canal: No prevertebral fluid or swelling. No visible canal hematoma. Disc levels: Mild degenerative changes without  high-grade spinal canal stenosis. Upper chest: For findings in the thorax, please see same day CT chest. Other: None. IMPRESSION: 1. No acute fracture or traumatic malalignment of the cervical spine. 2. For findings in the thorax, please see same day CT chest. Electronically Signed   By: Merilyn Baba M.D.   On: 05/24/2022 13:03   DG Chest Portable 1 View  Result Date: 05/24/2022 CLINICAL DATA:  Shortness of breath. EXAM: PORTABLE CHEST 1 VIEW COMPARISON:  CXR 01/09/19 FINDINGS: No pleural effusion. No pneumothorax. Unchanged cardiac mediastinal contours with the moderate-sized hiatal hernia. There are patchy bilateral airspace opacities there are new from prior exam. Visualized upper abdomen is notable for gas distended loops of small and large bowel. IMPRESSION: New patchy bilateral airspace opacities, suspicious for multifocal pneumonia. Electronically Signed   By: Marin Roberts M.D.   On: 05/24/2022 11:16   DG Chest 2 View  Result Date: 05/21/2022 CLINICAL DATA:  Shortness of breath. Cough and congestion with fever EXAM: CHEST - 2 VIEW COMPARISON:  01/09/2019 FINDINGS: Diffuse interstitial opacity with infiltrate at the left base and in the right upper lobe. Hiatal hernia with moderate gaseous distension compared to prior. Normal heart size and aortic contours. No acute osseous finding. IMPRESSION: 1. Bilateral pneumonia. 2. Sizable hiatal hernia. Electronically Signed   By: Jorje Guild M.D.   On: 05/21/2022 09:10    Microbiology: Results for orders placed or performed during the hospital encounter of 05/24/22  Resp Panel by RT-PCR (Flu A&B, Covid) Anterior Nasal Swab     Status: None   Collection Time: 05/24/22 11:34 AM   Specimen: Anterior Nasal Swab  Result Value Ref Range Status   SARS Coronavirus 2 by RT PCR NEGATIVE NEGATIVE Final    Comment: (NOTE) SARS-CoV-2 target nucleic acids are NOT DETECTED.  The SARS-CoV-2 RNA is generally detectable in upper respiratory specimens during the  acute phase of infection. The lowest concentration of SARS-CoV-2 viral copies this assay can detect is 138 copies/mL. A negative result does not preclude SARS-Cov-2 infection and should not be used as the sole basis for treatment or other patient management decisions. A negative result may occur with  improper specimen collection/handling, submission of specimen other than nasopharyngeal swab, presence of viral mutation(s) within the areas targeted by this assay, and inadequate number of viral copies(<138 copies/mL). A negative result must be combined with clinical observations, patient history, and epidemiological information. The expected result is Negative.  Fact Sheet for Patients:  EntrepreneurPulse.com.au  Fact Sheet for Healthcare Providers:  IncredibleEmployment.be  This test is no t yet approved or cleared by the [place] FDA and  has been authorized for detection and/or diagnosis of SARS-CoV-2 by FDA under an Emergency Use Authorization (EUA). This EUA will remain  in effect (meaning this test can be used) for the duration of the COVID-19 declaration under Section 564(b)(1) of the Act, 21 U.S.C.section 360bbb-3(b)(1), unless the authorization is terminated  or revoked sooner.       Influenza A by PCR NEGATIVE NEGATIVE Final   Influenza B by PCR NEGATIVE NEGATIVE Final  Comment: (NOTE) The Xpert Xpress SARS-CoV-2/FLU/RSV plus assay is intended as an aid in the diagnosis of influenza from Nasopharyngeal swab specimens and should not be used as a sole basis for treatment. Nasal washings and aspirates are unacceptable for Xpert Xpress SARS-CoV-2/FLU/RSV testing.  Fact Sheet for Patients: EntrepreneurPulse.com.au  Fact Sheet for Healthcare Providers: IncredibleEmployment.be  This test is not yet approved or cleared by the [place] FDA and has been authorized for detection and/or  diagnosis of SARS-CoV-2 by FDA under an Emergency Use Authorization (EUA). This EUA will remain in effect (meaning this test can be used) for the duration of the COVID-19 declaration under Section 564(b)(1) of the Act, 21 U.S.C. section 360bbb-3(b)(1), unless the authorization is terminated or revoked.  Performed at [facility], [address] [place], [place] [address]   Blood culture (routine x 2)     Status: None (Preliminary result)   Collection Time: 05/24/22 11:34 AM   Specimen: BLOOD  Result Value Ref Range Status   Specimen Description BLOOD  LEFT [facility]  Final   Special Requests   Final    BOTTLES DRAWN AEROBIC AND ANAEROBIC Blood Culture results may not be optimal due to an excessive volume of blood received in culture bottles   Culture   Final    NO GROWTH 2 DAYS Performed at [facility], [address] [place], [place] [address]    Report Status PENDING  Incomplete  Blood culture (routine x 2)     Status: None (Preliminary result)   Collection Time: 05/24/22 11:34 AM   Specimen: BLOOD  Result Value Ref Range Status   Specimen Description BLOOD  RIGHT FOREARM  Final   Special Requests   Final    BOTTLES DRAWN AEROBIC AND ANAEROBIC Blood Culture adequate volume   Culture   Final    NO GROWTH 2 DAYS Performed at [facility], [address], [place], [place] [address]    Report Status PENDING  Incomplete  Respiratory (~20 pathogens) panel by PCR     Status: None   Collection Time: 05/25/22 11:00 AM   Specimen: Nasopharyngeal Swab; Respiratory  Result Value Ref Range Status   Adenovirus NOT DETECTED NOT DETECTED Final   Coronavirus 229E NOT DETECTED NOT DETECTED Final    Comment: (NOTE) The Coronavirus on the Respiratory Panel, DOES NOT test for the novel  Coronavirus (2019 nCoV)    Coronavirus HKU1 NOT DETECTED NOT DETECTED Final   Coronavirus NL63 NOT DETECTED NOT DETECTED Final   Coronavirus OC43 NOT DETECTED NOT DETECTED Final    Metapneumovirus NOT DETECTED NOT DETECTED Final   Rhinovirus / Enterovirus NOT DETECTED NOT DETECTED Final   Influenza A NOT DETECTED NOT DETECTED Final   Influenza B NOT DETECTED NOT DETECTED Final   Parainfluenza Virus 1 NOT DETECTED NOT DETECTED Final   Parainfluenza Virus 2 NOT DETECTED NOT DETECTED Final   Parainfluenza Virus 3 NOT DETECTED NOT DETECTED Final   Parainfluenza Virus 4 NOT DETECTED NOT DETECTED Final   Respiratory Syncytial Virus NOT DETECTED NOT DETECTED Final   Bordetella pertussis NOT DETECTED NOT DETECTED Final   Bordetella Parapertussis NOT DETECTED NOT DETECTED Final   Chlamydophila pneumoniae NOT DETECTED NOT DETECTED Final   Mycoplasma pneumoniae NOT DETECTED NOT DETECTED Final    Comment: Performed at [facility], [place] [address], [place], [place] [address]    Labs: CBC: Recent Labs  Lab 05/24/22 1134 05/25/22 0535 05/26/22 0549  WBC 15.3* 13.3* 10.5  HGB  14.3 11.9* 11.8*  HCT 43.5 35.7* 34.9*  MCV 94.4 93.5 93.3  PLT 357 340 683   Basic Metabolic Panel: Recent Labs  Lab 05/24/22 1134 05/25/22 0535 05/26/22 0549  NA 133* 133* 133*  K 3.8 3.6 3.4*  CL 99 103 103  CO2 21* 21* 21*  GLUCOSE 103* 104* 96  BUN 24* 20 18  CREATININE 1.12 1.08 0.99  CALCIUM 8.6* 7.9* 7.8*  MG  --   --  2.2   Liver Function Tests: No results for input(s): "AST", "ALT", "ALKPHOS", "BILITOT", "PROT", "ALBUMIN" in the last 168 hours. CBG: Recent Labs  Lab 05/24/22 1205  GLUCAP 108*    Discharge time spent: greater than 30 minutes.  Signed: Sharen Hones, MD [facility] 05/26/2022

## 2022-05-26 NOTE — TOC Transition Note (Addendum)
Transition of Care [facility]) - CM/SW Discharge Note   Patient Details  Name: ARMANDO BUKHARI MRN: [id number] Date of Birth: [birth year]  Transition of [facility]) CM/SW Contact:  Beverly Sessions, RN Phone Number: 05/26/2022, 11:55 AM   Clinical Narrative:      Patient to discharge today Tommi Rumps with [facility] notified of discharge Confirmed with Faythe Dingwall at [facility] that DME was delivered to room  Wife Horris Latino to transport      Patient Goals and CMS Choice      Discharge Placement                         Discharge Plan and Services Additional resources added to the After Visit Summary for                                       Social Determinants of Health (SDOH) Interventions SDOH Screenings   Food Insecurity: No Food Insecurity (05/24/2022)  Housing: Low Risk  (05/24/2022)  Transportation Needs: No Transportation Needs (05/24/2022)  Utilities: Not At Risk (05/24/2022)  Alcohol Screen: Low Risk  (10/27/2021)  Depression (PHQ2-9): Low Risk  (11/30/2021)  Financial Resource Strain: Low Risk  (10/27/2021)  Physical Activity: Sufficiently Active (10/27/2021)  Social Connections: Moderately Integrated (10/27/2021)  Stress: No Stress Concern Present (10/27/2021)  Tobacco Use: Medium Risk (05/24/2022)     Readmission Risk Interventions     No data to display

## 2022-05-27 ENCOUNTER — Telehealth: Payer: Self-pay | Admitting: *Deleted

## 2022-05-27 NOTE — Patient Outreach (Signed)
  Care Coordination [facility] Note Transition Care Management Follow-up Telephone Call Date of discharge and from where: [phone] [place] acquired pneumonia How have you been since you were released from the hospital? He still has a cough. He is not really strong but he is better than what he was. So overall he is doing pretty good. Dawayne Patricia wife) Any questions or concerns? No  Items Reviewed: Did the pt receive and understand the discharge instructions provided? Yes  Medications obtained and verified? Yes  he has changed his atbx as per ordered Other? No  Any new allergies since your discharge? No  Dietary orders reviewed? Yes Low sodium heart healthy diet. Patient is unable to taste and his appetite is poor.  Do you have support at home? Yes   Home Care and Equipment/Supplies: Were home health services ordered? Y  If so, what is the name of the agency?[facility] Has the agency set up a time to come to the patient's home?Y Were any new equipment or medical supplies ordered?  Yes: [facility]  What is the name of the medical supply agency? [facility] Were you able to get the supplies/equipment? yes Do you have any questions related to the use of the equipment or supplies? No  Functional Questionnaire: (I = Independent and D = Dependent) ADLs: D  Bathing/Dressing- D  Meal Prep- D  Eating- I  Maintaining continence- I  Transferring/Ambulation- D  Managing Meds- D  Follow up appointments reviewed:  PCP Hospital f/u appt confirmed? Yes  Scheduled to see Dr Otilio Miu [phone] 3 PM  [facility] f/u appt confirmed? No   Are transportation arrangements needed? No  If their condition worsens, is the pt aware to call PCP or go to the Emergency Dept.? Yes Was the patient provided with contact information for the PCP's office or ED? Yes Was to pt encouraged to call back with questions or concerns? Yes  SDOH assessments and interventions completed:   Yes SDOH Interventions Today     Flowsheet Row Most Recent Value  SDOH Interventions   Food Insecurity Interventions Intervention Not Indicated  Housing Interventions Intervention Not Indicated  Transportation Interventions Intervention Not Indicated       Care Coordination Interventions:  No Care Coordination interventions needed at this time.   Encounter Outcome:  Pt. Visit Completed    [facility] [phone]

## 2022-05-28 DIAGNOSIS — G9341 Metabolic encephalopathy: Secondary | ICD-10-CM | POA: Diagnosis not present

## 2022-05-28 DIAGNOSIS — J9601 Acute respiratory failure with hypoxia: Secondary | ICD-10-CM | POA: Diagnosis not present

## 2022-05-28 DIAGNOSIS — K449 Diaphragmatic hernia without obstruction or gangrene: Secondary | ICD-10-CM | POA: Diagnosis not present

## 2022-05-28 DIAGNOSIS — J439 Emphysema, unspecified: Secondary | ICD-10-CM | POA: Diagnosis not present

## 2022-05-28 DIAGNOSIS — J189 Pneumonia, unspecified organism: Secondary | ICD-10-CM | POA: Diagnosis not present

## 2022-05-28 DIAGNOSIS — I700 Atherosclerosis of aorta: Secondary | ICD-10-CM | POA: Diagnosis not present

## 2022-05-28 DIAGNOSIS — K219 Gastro-esophageal reflux disease without esophagitis: Secondary | ICD-10-CM | POA: Diagnosis not present

## 2022-05-28 DIAGNOSIS — I10 Essential (primary) hypertension: Secondary | ICD-10-CM | POA: Diagnosis not present

## 2022-05-28 DIAGNOSIS — I2721 Secondary pulmonary arterial hypertension: Secondary | ICD-10-CM | POA: Diagnosis not present

## 2022-05-29 LAB — CULTURE, BLOOD (ROUTINE X 2)
Culture: NO GROWTH
Culture: NO GROWTH
Special Requests: ADEQUATE

## 2022-05-30 DIAGNOSIS — J439 Emphysema, unspecified: Secondary | ICD-10-CM | POA: Diagnosis not present

## 2022-05-30 DIAGNOSIS — I10 Essential (primary) hypertension: Secondary | ICD-10-CM | POA: Diagnosis not present

## 2022-05-30 DIAGNOSIS — G9341 Metabolic encephalopathy: Secondary | ICD-10-CM | POA: Diagnosis not present

## 2022-05-30 DIAGNOSIS — I2721 Secondary pulmonary arterial hypertension: Secondary | ICD-10-CM | POA: Diagnosis not present

## 2022-05-30 DIAGNOSIS — K449 Diaphragmatic hernia without obstruction or gangrene: Secondary | ICD-10-CM | POA: Diagnosis not present

## 2022-05-30 DIAGNOSIS — J189 Pneumonia, unspecified organism: Secondary | ICD-10-CM | POA: Diagnosis not present

## 2022-05-30 DIAGNOSIS — I700 Atherosclerosis of aorta: Secondary | ICD-10-CM | POA: Diagnosis not present

## 2022-05-30 DIAGNOSIS — K219 Gastro-esophageal reflux disease without esophagitis: Secondary | ICD-10-CM | POA: Diagnosis not present

## 2022-05-30 DIAGNOSIS — J9601 Acute respiratory failure with hypoxia: Secondary | ICD-10-CM | POA: Diagnosis not present

## 2022-05-31 ENCOUNTER — Emergency Department: Payer: Medicare PPO

## 2022-05-31 ENCOUNTER — Emergency Department
Admission: EM | Admit: 2022-05-31 | Discharge: 2022-05-31 | Disposition: A | Discharge status: Home / Self Care | Payer: Medicare PPO | Source: Home / Self Care | Attending: Emergency Medicine | Admitting: Emergency Medicine

## 2022-05-31 DIAGNOSIS — K219 Gastro-esophageal reflux disease without esophagitis: Secondary | ICD-10-CM | POA: Diagnosis present

## 2022-05-31 DIAGNOSIS — Z86718 Personal history of other venous thrombosis and embolism: Secondary | ICD-10-CM | POA: Diagnosis not present

## 2022-05-31 DIAGNOSIS — R4182 Altered mental status, unspecified: Secondary | ICD-10-CM | POA: Diagnosis not present

## 2022-05-31 DIAGNOSIS — Z1152 Encounter for screening for COVID-19: Secondary | ICD-10-CM | POA: Insufficient documentation

## 2022-05-31 DIAGNOSIS — R339 Retention of urine, unspecified: Secondary | ICD-10-CM | POA: Insufficient documentation

## 2022-05-31 DIAGNOSIS — I2721 Secondary pulmonary arterial hypertension: Secondary | ICD-10-CM | POA: Diagnosis not present

## 2022-05-31 DIAGNOSIS — R748 Abnormal levels of other serum enzymes: Secondary | ICD-10-CM | POA: Insufficient documentation

## 2022-05-31 DIAGNOSIS — Z87891 Personal history of nicotine dependence: Secondary | ICD-10-CM | POA: Diagnosis not present

## 2022-05-31 DIAGNOSIS — D72829 Elevated white blood cell count, unspecified: Secondary | ICD-10-CM | POA: Insufficient documentation

## 2022-05-31 DIAGNOSIS — R338 Other retention of urine: Secondary | ICD-10-CM

## 2022-05-31 DIAGNOSIS — I129 Hypertensive chronic kidney disease with stage 1 through stage 4 chronic kidney disease, or unspecified chronic kidney disease: Secondary | ICD-10-CM | POA: Diagnosis present

## 2022-05-31 DIAGNOSIS — I272 Pulmonary hypertension, unspecified: Secondary | ICD-10-CM | POA: Diagnosis not present

## 2022-05-31 DIAGNOSIS — R0602 Shortness of breath: Secondary | ICD-10-CM | POA: Insufficient documentation

## 2022-05-31 DIAGNOSIS — G9341 Metabolic encephalopathy: Secondary | ICD-10-CM | POA: Diagnosis not present

## 2022-05-31 DIAGNOSIS — J439 Emphysema, unspecified: Secondary | ICD-10-CM | POA: Diagnosis not present

## 2022-05-31 DIAGNOSIS — J9601 Acute respiratory failure with hypoxia: Secondary | ICD-10-CM | POA: Diagnosis not present

## 2022-05-31 DIAGNOSIS — I700 Atherosclerosis of aorta: Secondary | ICD-10-CM | POA: Diagnosis not present

## 2022-05-31 DIAGNOSIS — Z87820 Personal history of traumatic brain injury: Secondary | ICD-10-CM | POA: Diagnosis not present

## 2022-05-31 DIAGNOSIS — R918 Other nonspecific abnormal finding of lung field: Secondary | ICD-10-CM | POA: Diagnosis not present

## 2022-05-31 DIAGNOSIS — I10 Essential (primary) hypertension: Secondary | ICD-10-CM | POA: Diagnosis not present

## 2022-05-31 DIAGNOSIS — J189 Pneumonia, unspecified organism: Secondary | ICD-10-CM | POA: Diagnosis present

## 2022-05-31 DIAGNOSIS — Z8249 Family history of ischemic heart disease and other diseases of the circulatory system: Secondary | ICD-10-CM | POA: Diagnosis not present

## 2022-05-31 DIAGNOSIS — Z85828 Personal history of other malignant neoplasm of skin: Secondary | ICD-10-CM | POA: Diagnosis not present

## 2022-05-31 DIAGNOSIS — Z79899 Other long term (current) drug therapy: Secondary | ICD-10-CM | POA: Diagnosis not present

## 2022-05-31 DIAGNOSIS — R1030 Lower abdominal pain, unspecified: Secondary | ICD-10-CM | POA: Insufficient documentation

## 2022-05-31 DIAGNOSIS — R0600 Dyspnea, unspecified: Secondary | ICD-10-CM | POA: Diagnosis not present

## 2022-05-31 DIAGNOSIS — Z882 Allergy status to sulfonamides status: Secondary | ICD-10-CM | POA: Diagnosis not present

## 2022-05-31 DIAGNOSIS — K449 Diaphragmatic hernia without obstruction or gangrene: Secondary | ICD-10-CM | POA: Diagnosis present

## 2022-05-31 DIAGNOSIS — J9621 Acute and chronic respiratory failure with hypoxia: Secondary | ICD-10-CM | POA: Diagnosis present

## 2022-05-31 LAB — COMPREHENSIVE METABOLIC PANEL
ALT: 54 U/L — ABNORMAL HIGH (ref 0–44)
AST: 37 U/L (ref 15–41)
Albumin: 3.2 g/dL — ABNORMAL LOW (ref 3.5–5.0)
Alkaline Phosphatase: 61 U/L (ref 38–126)
Anion gap: 10 (ref 5–15)
BUN: 15 mg/dL (ref 8–23)
CO2: 24 mmol/L (ref 22–32)
Calcium: 8.5 mg/dL — ABNORMAL LOW (ref 8.9–10.3)
Chloride: 101 mmol/L (ref 98–111)
Creatinine, Ser: 1.2 mg/dL (ref 0.61–1.24)
GFR, Estimated: 59 mL/min — ABNORMAL LOW (ref >60)
Glucose, Bld: 106 mg/dL — ABNORMAL HIGH (ref 70–99)
Potassium: 4.3 mmol/L (ref 3.5–5.1)
Sodium: 135 mmol/L (ref 135–145)
Total Bilirubin: 0.7 mg/dL (ref 0.3–1.2)
Total Protein: 8 g/dL (ref 6.5–8.1)

## 2022-05-31 LAB — RESP PANEL BY RT-PCR (RSV, FLU A&B, COVID)  RVPGX2
Influenza A by PCR: NEGATIVE
Influenza B by PCR: NEGATIVE
Resp Syncytial Virus by PCR: NEGATIVE
SARS Coronavirus 2 by RT PCR: NEGATIVE

## 2022-05-31 LAB — CBC WITH DIFFERENTIAL/PLATELET
Abs Immature Granulocytes: 0.1 10*3/uL — ABNORMAL HIGH (ref 0.00–0.07)
Basophils Absolute: 0.1 10*3/uL (ref 0.0–0.1)
Basophils Relative: 1 %
Eosinophils Absolute: 0.2 10*3/uL (ref 0.0–0.5)
Eosinophils Relative: 2 %
HCT: 41.7 % (ref 39.0–52.0)
Hemoglobin: 13.4 g/dL (ref 13.0–17.0)
Immature Granulocytes: 1 %
Lymphocytes Relative: 16 %
Lymphs Abs: 1.9 10*3/uL (ref 0.7–4.0)
MCH: 30.9 pg (ref 26.0–34.0)
MCHC: 32.1 g/dL (ref 30.0–36.0)
MCV: 96.3 fL (ref 80.0–100.0)
Monocytes Absolute: 0.7 10*3/uL (ref 0.1–1.0)
Monocytes Relative: 6 %
Neutro Abs: 9.1 10*3/uL — ABNORMAL HIGH (ref 1.7–7.7)
Neutrophils Relative %: 74 %
Platelets: 672 10*3/uL — ABNORMAL HIGH (ref 150–400)
RBC: 4.33 MIL/uL (ref 4.22–5.81)
RDW: 13.1 % (ref 11.5–15.5)
WBC: 12.1 10*3/uL — ABNORMAL HIGH (ref 4.0–10.5)
nRBC: 0 % (ref 0.0–0.2)

## 2022-05-31 LAB — URINALYSIS, ROUTINE W REFLEX MICROSCOPIC
Bilirubin Urine: NEGATIVE
Glucose, UA: NEGATIVE mg/dL
Hgb urine dipstick: NEGATIVE
Ketones, ur: NEGATIVE mg/dL
Leukocytes,Ua: NEGATIVE
Nitrite: NEGATIVE
Protein, ur: NEGATIVE mg/dL
Specific Gravity, Urine: 1.016 (ref 1.005–1.030)
pH: 5 (ref 5.0–8.0)

## 2022-05-31 LAB — LIPASE, BLOOD: Lipase: 59 U/L — ABNORMAL HIGH (ref 11–51)

## 2022-05-31 LAB — TROPONIN I (HIGH SENSITIVITY)
Troponin I (High Sensitivity): 74 ng/L — ABNORMAL HIGH (ref <18)
Troponin I (High Sensitivity): 70 ng/L — ABNORMAL HIGH (ref <18)

## 2022-05-31 LAB — BRAIN NATRIURETIC PEPTIDE: B Natriuretic Peptide: 110.1 pg/mL — ABNORMAL HIGH (ref 0.0–100.0)

## 2022-05-31 MED ORDER — LACTATED RINGERS IV BOLUS
1000.0000 mL | Freq: Once | INTRAVENOUS | Status: AC
  Administered 2022-05-31: 1000 mL via INTRAVENOUS

## 2022-05-31 MED ORDER — MORPHINE SULFATE (PF) 2 MG/ML IV SOLN
2.0000 mg | Freq: Once | INTRAVENOUS | Status: DC
  Filled 2022-05-31: qty 1

## 2022-05-31 MED ORDER — IOHEXOL 350 MG/ML SOLN
100.0000 mL | Freq: Once | INTRAVENOUS | Status: AC | PRN
  Administered 2022-05-31: 100 mL via INTRAVENOUS

## 2022-05-31 NOTE — ED Provider Notes (Signed)
[facility] Provider Note    Event Date/Time   First MD Initiated Contact with Patient 05/31/22 0122     (approximate)   History   Shortness of Breath   HPI  Clayton [name] is a 88 y.o. male who presents to the ED for evaluation of Shortness of Breath   I reviewed medical DC summary from 1/25.  He was admitted a couple days for multifocal pneumonia treated with ceftriaxone and azithromycin, discharged cefdinir and azithromycin.  He otherwise has a history of TBI, HTN and DVT.  He was discharged home with home health.  Patient returns to the ED, accompanied by his wife, for evaluation of shortness of breath and lower abdominal discomfort.  They report that he has been compliant with antibiotics and doing well over the past few days since his discharge until the past 24 hours or so.  He has had episodes of lower abdominal pain, feeling restless and reports feeling better when he is up and walking and moving around.  Denies stool or urinary changes.  They thought the pain could be related to his persistent cough from the pneumonia.  Also reports dyspnea, feeling like he cannot get a deep breath for the past 24 hours.  Denies any lower extremity swelling.  Physical Exam   Triage Vital Signs: ED Triage Vitals [05/31/22 0021]  Enc Vitals Group     BP 134/73     Pulse Rate 83     Resp 20     Temp 97.9 F (36.6 C)     Temp Source Oral     SpO2 98 %     Weight 165 lb (74.8 kg)     Height '5\' 7"'$  (1.702 m)     Head Circumference      Peak Flow      Pain Score 0     Pain Loc      Pain Edu?      Excl. in [place]?     Most recent vital signs: Vitals:   05/31/22 0021 05/31/22 0154  BP: 134/73 (!) 129/91  Pulse: 83 75  Resp: 20 (!) 30  Temp: 97.9 F (36.6 C)   SpO2: 98% 94%    General: Awake, no distress.  CV:  Good peripheral perfusion.  Resp:   Tachypneic to nearly 30.  No wheezing Abd:  No distention.  Suprapubic tenderness is noted.  Benign upper  abdomen MSK:  No deformity noted.  Neuro:  No focal deficits appreciated. Other:     ED Results / Procedures / Treatments   Labs (all labs ordered are listed, but only abnormal results are displayed) Labs Reviewed  CBC WITH DIFFERENTIAL/PLATELET - Abnormal; Notable for the following components:      Result Value   WBC 12.1 (*)    Platelets 672 (*)    Neutro Abs 9.1 (*)    Abs Immature Granulocytes 0.10 (*)    All other components within normal limits  COMPREHENSIVE METABOLIC PANEL - Abnormal; Notable for the following components:   Glucose, Bld 106 (*)    Calcium 8.5 (*)    Albumin 3.2 (*)    ALT 54 (*)    GFR, Estimated 59 (*)    All other components within normal limits  URINALYSIS, ROUTINE W REFLEX MICROSCOPIC - Abnormal; Notable for the following components:   Color, Urine STRAW (*)    APPearance CLEAR (*)    All other components within normal limits  LIPASE, BLOOD - Abnormal;  Notable for the following components:   Lipase 59 (*)    All other components within normal limits  BRAIN NATRIURETIC PEPTIDE - Abnormal; Notable for the following components:   B Natriuretic Peptide 110.1 (*)    All other components within normal limits  TROPONIN I (HIGH SENSITIVITY) - Abnormal; Notable for the following components:   Troponin I (High Sensitivity) 74 (*)    All other components within normal limits  TROPONIN I (HIGH SENSITIVITY) - Abnormal; Notable for the following components:   Troponin I (High Sensitivity) 70 (*)    All other components within normal limits  RESP PANEL BY RT-PCR (RSV, FLU A&B, COVID)  RVPGX2    EKG Tremulous baseline clouds fine detail seems to demonstrate a sinus rhythm with a rate of 85 bpm.  Normal axis.  First-degree AV block without high-grade block.  No clear acute ischemic features.  RADIOLOGY 1 view CXR interpreted by me with patchy multifocal opacities bilaterally  Official radiology report(s): CT Angio Chest PE W and/or Wo Contrast  Result  Date: 05/31/2022 CLINICAL DATA:  Dyspnea, lower abdominal pain, follow-up pneumonia. EXAM: CT ANGIOGRAPHY CHEST CT ABDOMEN AND PELVIS WITH CONTRAST TECHNIQUE: Multidetector CT imaging of the chest was performed using the standard protocol during bolus administration of intravenous contrast. Multiplanar CT image reconstructions and MIPs were obtained to evaluate the vascular anatomy. Multidetector CT imaging of the abdomen and pelvis was performed using the standard protocol during bolus administration of intravenous contrast. RADIATION DOSE REDUCTION: This exam was performed according to the departmental dose-optimization program which includes automated exposure control, adjustment of the mA and/or kV according to patient size and/or use of iterative reconstruction technique. CONTRAST:  141m OMNIPAQUE IOHEXOL 350 MG/ML SOLN COMPARISON:  CTA chest dated 05/24/2022. FINDINGS: CTA CHEST FINDINGS Cardiovascular: Satisfactory opacification of the bilateral pulmonary arteries to the segmental level. No evidence of pulmonary embolism. Although not tailored for evaluation of the thoracic aorta, there is no evidence thoracic aortic aneurysm or dissection. Mild atherosclerotic calcifications of the aortic arch/root. Severe coronary atherosclerosis of the LAD. Mediastinum/Nodes: Small mediastinal lymph nodes, likely reactive. Visualized thyroid is unremarkable. Lungs/Pleura: Multifocal patchy opacities in the lungs bilaterally, subpleural/peripheral and basilar predominant, suggesting multifocal pneumonia. These opacities are more coarse/coalescent than on the prior. No suspicious pulmonary nodules. No pleural effusion or pneumothorax. Musculoskeletal: Moderate degenerative changes of the thoracic spine. Review of the MIP images confirms the above findings. CT ABDOMEN and PELVIS FINDINGS Hepatobiliary: Liver is within normal limits. Layering small gallstones (series 1/image 124), without associated inflammatory changes. No  intrahepatic or extrahepatic duct dilatation. Pancreas: Within normal limits. Spleen: Within normal limits. Adrenals/Urinary Tract: Adrenal glands are within normal limits. Kidneys are within normal limits.  No hydronephrosis. Bladder is within normal limits. Stomach/Bowel: Stomach is notable for a large hiatal hernia/inverted intrathoracic stomach. No evidence of bowel obstruction. Normal appendix (series 4/image 58). Extensive left colonic diverticulosis. Patulous proximal sigmoid diverticulum with mild surrounding pericolonic inflammatory changes (series 4/image 69), suggesting mild sigmoid diverticulitis. No drainable fluid collection/abscess. No free air to suggest macroscopic perforation. Vascular/Lymphatic: No evidence of abdominal aortic aneurysm. Atherosclerotic calcifications of the abdominal aorta and branch vessels. No suspicious abdominopelvic lymphadenopathy. Reproductive: Mild prostatomegaly. Other: No abdominopelvic ascites. Musculoskeletal: Mild degenerative changes of the lumbar spine. Review of the MIP images confirms the above findings. IMPRESSION: No evidence of pulmonary embolism. Multifocal pneumonia, as above. Mild sigmoid diverticulitis. No drainable fluid collection/abscess. No free air to suggest macroscopic perforation. Additional ancillary findings as above. Electronically Signed  By: Julian Hy M.D.   On: 05/31/2022 02:44   CT ABDOMEN PELVIS W CONTRAST  Result Date: 05/31/2022 CLINICAL DATA:  Dyspnea, lower abdominal pain, follow-up pneumonia. EXAM: CT ANGIOGRAPHY CHEST CT ABDOMEN AND PELVIS WITH CONTRAST TECHNIQUE: Multidetector CT imaging of the chest was performed using the standard protocol during bolus administration of intravenous contrast. Multiplanar CT image reconstructions and MIPs were obtained to evaluate the vascular anatomy. Multidetector CT imaging of the abdomen and pelvis was performed using the standard protocol during bolus administration of intravenous  contrast. RADIATION DOSE REDUCTION: This exam was performed according to the departmental dose-optimization program which includes automated exposure control, adjustment of the mA and/or kV according to patient size and/or use of iterative reconstruction technique. CONTRAST:  156m OMNIPAQUE IOHEXOL 350 MG/ML SOLN COMPARISON:  CTA chest dated 05/24/2022. FINDINGS: CTA CHEST FINDINGS Cardiovascular: Satisfactory opacification of the bilateral pulmonary arteries to the segmental level. No evidence of pulmonary embolism. Although not tailored for evaluation of the thoracic aorta, there is no evidence thoracic aortic aneurysm or dissection. Mild atherosclerotic calcifications of the aortic arch/root. Severe coronary atherosclerosis of the LAD. Mediastinum/Nodes: Small mediastinal lymph nodes, likely reactive. Visualized thyroid is unremarkable. Lungs/Pleura: Multifocal patchy opacities in the lungs bilaterally, subpleural/peripheral and basilar predominant, suggesting multifocal pneumonia. These opacities are more coarse/coalescent than on the prior. No suspicious pulmonary nodules. No pleural effusion or pneumothorax. Musculoskeletal: Moderate degenerative changes of the thoracic spine. Review of the MIP images confirms the above findings. CT ABDOMEN and PELVIS FINDINGS Hepatobiliary: Liver is within normal limits. Layering small gallstones (series 1/image 124), without associated inflammatory changes. No intrahepatic or extrahepatic duct dilatation. Pancreas: Within normal limits. Spleen: Within normal limits. Adrenals/Urinary Tract: Adrenal glands are within normal limits. Kidneys are within normal limits.  No hydronephrosis. Bladder is within normal limits. Stomach/Bowel: Stomach is notable for a large hiatal hernia/inverted intrathoracic stomach. No evidence of bowel obstruction. Normal appendix (series 4/image 58). Extensive left colonic diverticulosis. Patulous proximal sigmoid diverticulum with mild surrounding  pericolonic inflammatory changes (series 4/image 69), suggesting mild sigmoid diverticulitis. No drainable fluid collection/abscess. No free air to suggest macroscopic perforation. Vascular/Lymphatic: No evidence of abdominal aortic aneurysm. Atherosclerotic calcifications of the abdominal aorta and branch vessels. No suspicious abdominopelvic lymphadenopathy. Reproductive: Mild prostatomegaly. Other: No abdominopelvic ascites. Musculoskeletal: Mild degenerative changes of the lumbar spine. Review of the MIP images confirms the above findings. IMPRESSION: No evidence of pulmonary embolism. Multifocal pneumonia, as above. Mild sigmoid diverticulitis. No drainable fluid collection/abscess. No free air to suggest macroscopic perforation. Additional ancillary findings as above. Electronically Signed   By: SJulian HyM.D.   On: 05/31/2022 02:44   DG Chest Portable 1 View  Result Date: 05/31/2022 CLINICAL DATA:  Dyspnea EXAM: PORTABLE CHEST 1 VIEW COMPARISON:  05/24/2022 FINDINGS: Cardiac shadow is stable. Patchy airspace opacities are again identified bilaterally stable from the prior CT examination. Hiatal hernia is noted. No new focal infiltrate is seen. IMPRESSION: No change from the previous CT. Electronically Signed   By: MInez CatalinaM.D.   On: 05/31/2022 00:47    PROCEDURES and INTERVENTIONS:  .1-3 Lead EKG Interpretation  Performed by: SVladimir Crofts MD Authorized by: SVladimir Crofts MD     Interpretation: normal     ECG rate:  72   ECG rate assessment: normal     Rhythm: sinus rhythm     Ectopy: none     Conduction: normal     Medications  morphine (PF) 2 MG/ML injection 2 mg (0 mg Intravenous  Hold 05/31/22 0201)  lactated ringers bolus 1,000 mL (1,000 mLs Intravenous New Bag/Given 05/31/22 0201)  iohexol (OMNIPAQUE) 350 MG/ML injection 100 mL (100 mLs Intravenous Contrast Given 05/31/22 0226)     IMPRESSION / MDM / ASSESSMENT AND PLAN / ED COURSE  I reviewed the triage vital signs  and the nursing notes.  Differential diagnosis includes, but is not limited to, ACS, PTX, PNA, muscle strain/spasm, PE, dissection, diverticulitis, SBO, urinary retention  {Patient presents with symptoms of an acute illness or injury that is potentially life-threatening.  Pleasant 88 year old gentleman presents with lower abdominal pain and dyspnea, possibly all related to urinary retention.  He has no hypoxia or significant vital sign derangements beyond presenting tachypnea alongside his suprapubic pain.  He initially tells me he has no urinary symptoms or troubles, but on later reassessment he tells me that he has not voided for a day or 2.  After he does pass some urine and effectively empty his bladder then his symptoms resolved.  After this, he has no abdominal tenderness.  He has noted to have a mild leukocytosis.  No significant metabolic derangements.  Lipase is marginally elevated as well as his BNP, the clinically no signs of CHF and I doubt pancreatitis.  His troponins are moderately elevated and flat.  CTA chest obtained to assess for PE, which is negative.  Progression and evolution of his multifocal pneumonia is noted.  Venous follow-through of his abdomen due to his tenderness demonstrates possible diverticulitis.  I considered adjusting and adding enteric coverage of antibiotics such as metronidazole to his regimen, since he just finished a third-generation cephalosporin, but his lack of tenderness or lower abdominal symptoms after voiding makes me less suspicious for any clinically apparent diverticulitis.  I am uncertain of the significance of his troponinemia and I recommended medical admission for observation due to this, but he declines.  He remains asymptomatic, no indications for Foley catheter.  It is possible his dyspnea was related to the pain he was feeling from urinary retention.  We discussed very close return precautions  Clinical Course as of 05/31/22 0419  Tue May 31, 2022  0353 Reassessed.  Patient reports feeling much better and is "ready to go."  He now tells me he feels much better after he voided and provided a urine sample.  He reports he "has not urinated for about 30 hours."  I asked him to clarify as he certainly did not tell me about this during my initial evaluation when I was asking him about urinary symptoms.  He says he must of forgotten.  We discussed the generally reassuring workup.  We discussed some signs of diverticulitis on CT.  I reexamined his abdomen he has a nontender lower abdomen, distinctly different than my initial evaluation.  [DS]  2094 Reassessed.  He was able to void again and postvoid residual of about 120 cc.  I again discussed workup and talk with the patient and his wife.  He again feels normal and requesting discharge.  His wife initially is frustrated as she was concerned about his initial presenting dyspnea and pain pain.  We discussed possible etiologies.  I honestly suspect that this was all related to acute urinary retention.  Due to the uncertainty of this and his troponinemia, I did recommend admission but he declines acknowledging the risks of morbidity mortality from undiagnosed cardiopulmonary pathology. [DS]    Clinical Course User Index [DS] Vladimir Crofts, MD     FINAL CLINICAL IMPRESSION(S) / ED  DIAGNOSES   Final diagnoses:  Acute urinary retention  SOB (shortness of breath)     Rx / DC Orders   ED Discharge Orders     None        Note:  This document was prepared using Dragon voice recognition software and may include unintentional dictation errors.   Vladimir Crofts, MD 05/31/22 [phone]

## 2022-05-31 NOTE — ED Triage Notes (Signed)
Ambulatory to triage with c/o difficulty breathing. Pt woke up from sleep stating he was feeling like he was just getting " bites of air." Pt wife states pt also c/o lower abd pain this evening, but pt believes it is from coughing so frequently, and pt kept stating " it feels like I can't get air down low enough."  Recently admitted and dc'd after having PNA. No home 02 requirement.  Pt presents to triage with noted shallow respirations

## 2022-06-01 ENCOUNTER — Telehealth: Payer: Self-pay | Admitting: Family Medicine

## 2022-06-01 ENCOUNTER — Telehealth: Payer: Self-pay

## 2022-06-01 DIAGNOSIS — G9341 Metabolic encephalopathy: Secondary | ICD-10-CM | POA: Diagnosis not present

## 2022-06-01 DIAGNOSIS — J439 Emphysema, unspecified: Secondary | ICD-10-CM | POA: Diagnosis not present

## 2022-06-01 DIAGNOSIS — K449 Diaphragmatic hernia without obstruction or gangrene: Secondary | ICD-10-CM | POA: Diagnosis not present

## 2022-06-01 DIAGNOSIS — J9601 Acute respiratory failure with hypoxia: Secondary | ICD-10-CM | POA: Diagnosis not present

## 2022-06-01 DIAGNOSIS — I2721 Secondary pulmonary arterial hypertension: Secondary | ICD-10-CM | POA: Diagnosis not present

## 2022-06-01 DIAGNOSIS — J189 Pneumonia, unspecified organism: Secondary | ICD-10-CM | POA: Diagnosis not present

## 2022-06-01 DIAGNOSIS — I700 Atherosclerosis of aorta: Secondary | ICD-10-CM | POA: Diagnosis not present

## 2022-06-01 DIAGNOSIS — I10 Essential (primary) hypertension: Secondary | ICD-10-CM | POA: Diagnosis not present

## 2022-06-01 DIAGNOSIS — K219 Gastro-esophageal reflux disease without esophagitis: Secondary | ICD-10-CM | POA: Diagnosis not present

## 2022-06-01 NOTE — Telephone Encounter (Signed)
Home Health Verbal Orders - Caller/Agency: [facility] Number: [phone] Requesting OT/PT/Skilled Nursing/Social Work/Speech Therapy: PT  Frequency:   1w1 2w2

## 2022-06-01 NOTE — Telephone Encounter (Signed)
        Patient  visited [facility] on 1/30   Telephone encounter attempt :  1st  A HIPAA compliant voice message was left requesting a return call.  Instructed patient to call back   [facility] [phone] [address], [place], [place] [address] Phone: [phone] Email: [email or web address]

## 2022-06-01 NOTE — Telephone Encounter (Signed)
Returned call to [facility] at [place]- [phone]- may proceed with PT for 3 weeks s/p pneumonia/ falls

## 2022-06-01 NOTE — Telephone Encounter (Signed)
Returned call to [employer] at [facility] [phone]. Gave the okay for OT 1 time week x 4 weeks

## 2022-06-01 NOTE — Telephone Encounter (Unsigned)
Copied from [facility] [phone]. Topic: Quick Communication - Home Health Verbal Orders >> Jun 01, 2022  4:35 PM Ja-Kwan M wrote: Caller/Agency: Fel[facility] with [facility] Number: [phone] Requesting OT/PT/Skilled Nursing/Social Work/Speech Therapy: OT Frequency: 1 X 4 weeks

## 2022-06-02 ENCOUNTER — Ambulatory Visit
Admission: RE | Admit: 2022-06-02 | Discharge: 2022-06-02 | Disposition: A | Discharge status: Home / Self Care | Payer: Medicare PPO | Source: Home / Self Care | Attending: Family Medicine | Admitting: Family Medicine

## 2022-06-02 ENCOUNTER — Emergency Department: Payer: Medicare PPO

## 2022-06-02 ENCOUNTER — Inpatient Hospital Stay
Admission: EM | Admit: 2022-06-02 | Discharge: 2022-06-04 | DRG: 193 | Disposition: A | Discharge status: Home / Self Care | Payer: Medicare PPO | Attending: Internal Medicine | Admitting: Internal Medicine

## 2022-06-02 ENCOUNTER — Encounter: Payer: Self-pay | Admitting: Emergency Medicine

## 2022-06-02 ENCOUNTER — Encounter: Payer: Self-pay | Admitting: Family Medicine

## 2022-06-02 ENCOUNTER — Telehealth: Payer: Self-pay

## 2022-06-02 ENCOUNTER — Ambulatory Visit: Payer: Medicare PPO | Admitting: Family Medicine

## 2022-06-02 ENCOUNTER — Ambulatory Visit
Admission: RE | Admit: 2022-06-02 | Discharge: 2022-06-02 | Disposition: A | Discharge status: Home / Self Care | Payer: Medicare PPO | Source: Ambulatory Visit | Attending: Family Medicine | Admitting: Family Medicine

## 2022-06-02 VITALS — BP 120/60 | HR 72 | Ht 67.0 in | Wt 161.0 lb

## 2022-06-02 DIAGNOSIS — Z882 Allergy status to sulfonamides status: Secondary | ICD-10-CM

## 2022-06-02 DIAGNOSIS — K449 Diaphragmatic hernia without obstruction or gangrene: Secondary | ICD-10-CM | POA: Diagnosis present

## 2022-06-02 DIAGNOSIS — R4182 Altered mental status, unspecified: Secondary | ICD-10-CM

## 2022-06-02 DIAGNOSIS — Z1152 Encounter for screening for COVID-19: Secondary | ICD-10-CM

## 2022-06-02 DIAGNOSIS — J9601 Acute respiratory failure with hypoxia: Secondary | ICD-10-CM | POA: Diagnosis present

## 2022-06-02 DIAGNOSIS — I824Y1 Acute embolism and thrombosis of unspecified deep veins of right proximal lower extremity: Secondary | ICD-10-CM | POA: Diagnosis present

## 2022-06-02 DIAGNOSIS — I10 Essential (primary) hypertension: Secondary | ICD-10-CM | POA: Diagnosis present

## 2022-06-02 DIAGNOSIS — K219 Gastro-esophageal reflux disease without esophagitis: Secondary | ICD-10-CM | POA: Diagnosis present

## 2022-06-02 DIAGNOSIS — Z87891 Personal history of nicotine dependence: Secondary | ICD-10-CM

## 2022-06-02 DIAGNOSIS — Z79899 Other long term (current) drug therapy: Secondary | ICD-10-CM

## 2022-06-02 DIAGNOSIS — Z85828 Personal history of other malignant neoplasm of skin: Secondary | ICD-10-CM

## 2022-06-02 DIAGNOSIS — R339 Retention of urine, unspecified: Secondary | ICD-10-CM | POA: Diagnosis present

## 2022-06-02 DIAGNOSIS — R0602 Shortness of breath: Secondary | ICD-10-CM | POA: Diagnosis present

## 2022-06-02 DIAGNOSIS — Z86718 Personal history of other venous thrombosis and embolism: Secondary | ICD-10-CM

## 2022-06-02 DIAGNOSIS — I272 Pulmonary hypertension, unspecified: Secondary | ICD-10-CM | POA: Diagnosis not present

## 2022-06-02 DIAGNOSIS — I129 Hypertensive chronic kidney disease with stage 1 through stage 4 chronic kidney disease, or unspecified chronic kidney disease: Secondary | ICD-10-CM | POA: Diagnosis present

## 2022-06-02 DIAGNOSIS — Z87820 Personal history of traumatic brain injury: Secondary | ICD-10-CM | POA: Diagnosis not present

## 2022-06-02 DIAGNOSIS — R0600 Dyspnea, unspecified: Secondary | ICD-10-CM | POA: Diagnosis not present

## 2022-06-02 DIAGNOSIS — J189 Pneumonia, unspecified organism: Principal | ICD-10-CM | POA: Diagnosis present

## 2022-06-02 DIAGNOSIS — G9341 Metabolic encephalopathy: Secondary | ICD-10-CM | POA: Diagnosis present

## 2022-06-02 DIAGNOSIS — J9621 Acute and chronic respiratory failure with hypoxia: Secondary | ICD-10-CM | POA: Diagnosis present

## 2022-06-02 DIAGNOSIS — Z8249 Family history of ischemic heart disease and other diseases of the circulatory system: Secondary | ICD-10-CM | POA: Diagnosis not present

## 2022-06-02 DIAGNOSIS — R918 Other nonspecific abnormal finding of lung field: Secondary | ICD-10-CM | POA: Diagnosis not present

## 2022-06-02 HISTORY — DX: Gastrointestinal hemorrhage, unspecified: K92.2

## 2022-06-02 LAB — BASIC METABOLIC PANEL
Anion gap: 11 (ref 5–15)
BUN: 13 mg/dL (ref 8–23)
CO2: 20 mmol/L — ABNORMAL LOW (ref 22–32)
Calcium: 8.6 mg/dL — ABNORMAL LOW (ref 8.9–10.3)
Chloride: 106 mmol/L (ref 98–111)
Creatinine, Ser: 1.17 mg/dL (ref 0.61–1.24)
GFR, Estimated: >60 mL/min (ref >60)
Glucose, Bld: 103 mg/dL — ABNORMAL HIGH (ref 70–99)
Potassium: 4.1 mmol/L (ref 3.5–5.1)
Sodium: 137 mmol/L (ref 135–145)

## 2022-06-02 LAB — CBC
HCT: 41.7 % (ref 39.0–52.0)
Hemoglobin: 13.3 g/dL (ref 13.0–17.0)
MCH: 30.5 pg (ref 26.0–34.0)
MCHC: 31.9 g/dL (ref 30.0–36.0)
MCV: 95.6 fL (ref 80.0–100.0)
Platelets: 710 10*3/uL — ABNORMAL HIGH (ref 150–400)
RBC: 4.36 MIL/uL (ref 4.22–5.81)
RDW: 13.1 % (ref 11.5–15.5)
WBC: 10 10*3/uL (ref 4.0–10.5)
nRBC: 0 % (ref 0.0–0.2)

## 2022-06-02 LAB — PROCALCITONIN: Procalcitonin: <0.1 ng/mL

## 2022-06-02 LAB — TROPONIN I (HIGH SENSITIVITY): Troponin I (High Sensitivity): 18 ng/L — ABNORMAL HIGH (ref <18)

## 2022-06-02 MED ORDER — SODIUM CHLORIDE 0.9 % IV SOLN
100.0000 mg | Freq: Once | INTRAVENOUS | Status: DC
  Administered 2022-06-02: 100 mg via INTRAVENOUS
  Filled 2022-06-02: qty 100

## 2022-06-02 MED ORDER — ALBUTEROL SULFATE HFA 108 (90 BASE) MCG/ACT IN AERS: 2.0000 | INHALATION_SPRAY | RESPIRATORY_TRACT | Status: DC | PRN

## 2022-06-02 MED ORDER — ACETAMINOPHEN 325 MG PO TABS: 650.0000 mg | ORAL_TABLET | Freq: Four times a day (QID) | ORAL | Status: DC | PRN

## 2022-06-02 MED ORDER — VANCOMYCIN HCL 1500 MG/300ML IV SOLN
1500.0000 mg | Freq: Once | INTRAVENOUS | Status: DC
  Filled 2022-06-02: qty 300

## 2022-06-02 MED ORDER — VANCOMYCIN HCL IN DEXTROSE 1-5 GM/200ML-% IV SOLN: 1000.0000 mg | INTRAVENOUS | Status: DC

## 2022-06-02 MED ORDER — ONDANSETRON HCL 4 MG/2ML IJ SOLN: 4.0000 mg | Freq: Four times a day (QID) | INTRAMUSCULAR | Status: DC | PRN

## 2022-06-02 MED ORDER — SODIUM CHLORIDE 0.9 % IV SOLN: 1.0000 g | Freq: Once | INTRAVENOUS | Status: DC

## 2022-06-02 MED ORDER — HYDRALAZINE HCL 20 MG/ML IJ SOLN: 5.0000 mg | INTRAMUSCULAR | Status: DC | PRN

## 2022-06-02 MED ORDER — SODIUM CHLORIDE 0.9 % IV SOLN: INTRAVENOUS | Status: DC

## 2022-06-02 MED ORDER — GUAIFENESIN ER 600 MG PO TB12: 600.0000 mg | ORAL_TABLET | Freq: Two times a day (BID) | ORAL | Status: DC | PRN

## 2022-06-02 MED ORDER — HEPARIN SODIUM (PORCINE) 5000 UNIT/ML IJ SOLN
5000.0000 [IU] | Freq: Three times a day (TID) | INTRAMUSCULAR | Status: DC
  Administered 2022-06-03 – 2022-06-04 (×5): 5000 [IU] via SUBCUTANEOUS
  Filled 2022-06-02 (×5): qty 1

## 2022-06-02 MED ORDER — ONDANSETRON HCL 4 MG PO TABS: 4.0000 mg | ORAL_TABLET | Freq: Four times a day (QID) | ORAL | Status: DC | PRN

## 2022-06-02 MED ORDER — SODIUM CHLORIDE 0.9 % IV SOLN: 500.0000 mg | INTRAVENOUS | Status: DC

## 2022-06-02 MED ORDER — SODIUM CHLORIDE 0.9 % IV SOLN: 2.0000 g | INTRAVENOUS | Status: DC

## 2022-06-02 MED ORDER — ALBUTEROL SULFATE (2.5 MG/3ML) 0.083% IN NEBU: 2.5000 mg | INHALATION_SOLUTION | RESPIRATORY_TRACT | Status: DC | PRN

## 2022-06-02 MED ORDER — ACETAMINOPHEN 650 MG RE SUPP: 650.0000 mg | Freq: Four times a day (QID) | RECTAL | Status: DC | PRN

## 2022-06-02 NOTE — ED Provider Notes (Signed)
[facility] Provider Note    Event Date/Time   First MD Initiated Contact with Patient 06/02/22 2051     (approximate)   History   Chief Complaint Shortness of Breath   HPI  SAIGE BUSBY is a 88 y.o. male with past medical history of hypertension, CKD, and DVT who presents to the ED complaining of shortness of breath.  Patient was recently admitted to the hospital for multifocal pneumonia, subsequently completed treatment with cefdinir and azithromycin.  Wife states that he was confused at the time of admission but his mental status has seemed to worsen since he has returned home.  He is also having intermittent episodes of difficulty breathing where he will start breathing very fast and report feeling short of breath.  She states that he had 1 of these episodes prior to arrival, although his breathing seems to have improved now.  He states that he continues to feel short of breath but it is better than before, denies any pain in his chest.  He has not had any fevers but continues to have a productive cough.  He has not noticed any pain or swelling in his legs.     Physical Exam   Triage Vital Signs: ED Triage Vitals  Enc Vitals Group     BP 06/02/22 2000 (!) 165/75     Pulse Rate 06/02/22 2000 65     Resp 06/02/22 2000 20     Temp 06/02/22 2000 98 F (36.7 C)     Temp Source 06/02/22 2000 Oral     SpO2 06/02/22 2000 95 %     Weight 06/02/22 2001 165 lb (74.8 kg)     Height 06/02/22 2001 '5\' 7"'$  (1.702 m)     Head Circumference --      Peak Flow --      Pain Score 06/02/22 2010 4     Pain Loc --      Pain Edu? --      Excl. in [place]? --     Most recent vital signs: Vitals:   06/02/22 2000  BP: (!) 165/75  Pulse: 65  Resp: 20  Temp: 98 F (36.7 C)  SpO2: 95%    Constitutional: Alert and oriented to person and place, but not time or situation. Eyes: Conjunctivae are normal. Head: Atraumatic. Nose: No  congestion/rhinnorhea. Mouth/Throat: Mucous membranes are moist.  Cardiovascular: Normal rate, regular rhythm. Grossly normal heart sounds.  2+ radial pulses bilaterally. Respiratory: Normal respiratory effort.  No retractions. Lungs CTAB. Gastrointestinal: Soft and nontender. No distention. Musculoskeletal: No lower extremity tenderness nor edema.  Neurologic:  Normal speech and language. No gross focal neurologic deficits are appreciated.    ED Results / Procedures / Treatments   Labs (all labs ordered are listed, but only abnormal results are displayed) Labs Reviewed  BASIC METABOLIC PANEL - Abnormal; Notable for the following components:      Result Value   CO2 20 (*)    Glucose, Bld 103 (*)    Calcium 8.6 (*)    All other components within normal limits  CBC - Abnormal; Notable for the following components:   Platelets 710 (*)    All other components within normal limits  TROPONIN I (HIGH SENSITIVITY) - Abnormal; Notable for the following components:   Troponin I (High Sensitivity) 18 (*)    All other components within normal limits  RESP PANEL BY RT-PCR (RSV, FLU A&B, COVID)  RVPGX2  EXPECTORATED SPUTUM ASSESSMENT W  GRAM STAIN, RFLX TO RESP C  CULTURE, BLOOD (ROUTINE X 2)  CULTURE, BLOOD (ROUTINE X 2)  EXPECTORATED SPUTUM ASSESSMENT W GRAM STAIN, RFLX TO RESP C  MRSA NEXT GEN BY PCR, NASAL  PROCALCITONIN  STREP PNEUMONIAE URINARY ANTIGEN  LEGIONELLA PNEUMOPHILA SEROGP 1 UR AG  TROPONIN I (HIGH SENSITIVITY)     EKG  ED ECG REPORT I, Blake Divine, the attending physician, personally viewed and interpreted this ECG.   Date: 06/02/2022  EKG Time: 20:08  Rate: 83  Rhythm: normal sinus rhythm  Axis: LAD  Intervals:none  ST&T Change: None  RADIOLOGY Chest x-ray reviewed and interpreted by me with multifocal infiltrates concerning for pneumonia.  PROCEDURES:  Critical Care performed: No  Procedures   MEDICATIONS ORDERED IN ED: Medications  0.9 %   sodium chloride infusion (has no administration in time range)  acetaminophen (TYLENOL) tablet 650 mg (has no administration in time range)    Or  acetaminophen (TYLENOL) suppository 650 mg (has no administration in time range)  ondansetron (ZOFRAN) tablet 4 mg (has no administration in time range)    Or  ondansetron (ZOFRAN) injection 4 mg (has no administration in time range)  albuterol (PROVENTIL) (2.5 MG/3ML) 0.083% nebulizer solution 2.5 mg (has no administration in time range)  guaiFENesin (MUCINEX) 12 hr tablet 600 mg (has no administration in time range)  hydrALAZINE (APRESOLINE) injection 5 mg (has no administration in time range)  cefTRIAXone (ROCEPHIN) 2 g in sodium chloride 0.9 % 100 mL IVPB (has no administration in time range)  azithromycin (ZITHROMAX) 500 mg in sodium chloride 0.9 % 250 mL IVPB (has no administration in time range)  heparin injection 5,000 Units (has no administration in time range)  vancomycin (VANCOREADY) IVPB 1500 mg/300 mL (has no administration in time range)     IMPRESSION / MDM / ASSESSMENT AND PLAN / ED COURSE  I reviewed the triage vital signs and the nursing notes.                              88 y.o. male with past medical history of hypertension, CKD, and DVT who presents to the ED with ongoing difficulty breathing, cough, and confusion following recent admission for multifocal pneumonia.  Patient's presentation is most consistent with acute presentation with potential threat to life or bodily function.  Differential diagnosis includes, but is not limited to, ACS, PE, pneumonia, pneumothorax, CHF, COPD, pleural effusion, electrolyte abnormality, AKI, stroke, UTI.  Patient nontoxic-appearing and in no acute distress, vital signs are unremarkable.  He is not in any respiratory distress and currently maintaining oxygen saturations at 95% on room air.  Lungs are clear to auscultation bilaterally, but chest x-ray does redemonstrate multifocal  pneumonia, appears slightly worse compared to previous on my review.  Patient also appears confused, wife states he is ANO x 4 at baseline but he is definitely disoriented today, although he has no focal neurologic deficits.  Repeat CT head is negative for acute process, ongoing pneumonia may be contributing to his change in mental status.  We will broaden antibiotics to cefepime and doxycycline for atypical coverage.  Labs are reassuring with no significant anemia, leukocytosis, electrolyte abnormality, or AKI.  Troponin downtrending compared to previous.  Case discussed with hospitalist for admission.      FINAL CLINICAL IMPRESSION(S) / ED DIAGNOSES   Final diagnoses:  Multifocal pneumonia  Altered mental status, unspecified altered mental status type     Rx /  DC Orders   ED Discharge Orders     None        Note:  This document was prepared using Dragon voice recognition software and may include unintentional dictation errors.   Blake Divine, MD 06/02/22 [phone]

## 2022-06-02 NOTE — Telephone Encounter (Signed)
Transition Care Management Follow-up Telephone Call Date of discharge and from where: [facility] 06/01/2022 How have you been since you were released from the hospital? Still SOB when walking Any questions or concerns? No  Items Reviewed: Did the pt receive and understand the discharge instructions provided? Yes  Medications obtained and verified? Yes  Other? No  Any new allergies since your discharge? No  Dietary orders reviewed? Yes Do you have support at home? Yes   Home Care and Equipment/Supplies: none Functional Questionnaire: (I = Independent and D = Dependent) ADLs: I  Bathing/Dressing- I  Meal Prep- I  Eating- I  Maintaining continence- I  Transferring/Ambulation- I  Managing Meds- D  Follow up appointments reviewed:  PCP Hospital f/u appt confirmed?  Otilio Miu   Scheduled to see today  [facility] f/u appt confirmed? no Are transportation arrangements needed? No  If their condition worsens, is the pt aware to call PCP or go to the Emergency Dept.? Yes Was the patient provided with contact information for the PCP's office or ED? Yes Was to pt encouraged to call back with questions or concerns? Yes

## 2022-06-02 NOTE — Consult Note (Signed)
Pharmacy Antibiotic Note  Clayton [name] is a 88 y.o. male admitted on 06/02/2022 with pneumonia.  Pharmacy has been consulted for vancomycin dosing.  Plan: Vancomycin 1500 mg x 1 given in ED.  Initiate Vancomycin 1000 mg Q24H. Goal AUC 400-550 Estimated AUC 461/Cmin: 11.9 Scr 1.17, IBW, Vd 0.72   Height: '5\' 7"'$  (170.2 cm) Weight: 74.8 kg (165 lb) IBW/kg (Calculated) : 66.1  Temp (24hrs), Avg:98 F (36.7 C), Min:98 F (36.7 C), Max:98 F (36.7 C)  Recent Labs  Lab 05/31/22 0026 06/02/22 2009  WBC 12.1* 10.0  CREATININE 1.20 1.17    Estimated Creatinine Clearance: 43.2 mL/min (by C-G formula based on SCr of 1.17 mg/dL).    Allergies  Allergen Reactions   Aspirin Rash   Sulfa Antibiotics Rash    Antimicrobials this admission: 2/1 doxycycline x 1 2/2 Vancomycin >>  2/2 ceftriaxone >> 2/2 azithromycin >>  Dose adjustments this admission:   Microbiology results: 2/1 BCx: sent 2/1 Sputum: ordered  2/1 MRSA PCR: ordered  Thank you for allowing pharmacy to be a part of this patient's care.  Dorothe Pea, PharmD, BCPS Clinical Pharmacist   06/02/2022 11:54 PM

## 2022-06-02 NOTE — ED Triage Notes (Signed)
Pt presents via POV with complaints of SOB that started earlier today. Pt was recently seen here for same and was admitted last week for PNA. He denies any exertional dyspnea. Respirations equal and unlabored. Denies fevers, chills, N/V/D.

## 2022-06-02 NOTE — Progress Notes (Signed)
Date:  06/02/2022   Name:  Clayton [name]   DOB:  08-10-1934   MRN:  [id number]   Chief Complaint: Hospitalization Follow-up (DC on 1/31 from [facility] SOB and pneumonia. TOC placed today)  Shortness of Breath This is a new problem. The current episode started 1 to 4 weeks ago. The problem occurs every few minutes. The problem has been unchanged. Associated symptoms include orthopnea. Pertinent negatives include no chest pain, leg swelling, PND, rhinorrhea or wheezing. The symptoms are aggravated by lying flat. Treatments tried: antibiotics. The treatment provided no relief. His past medical history is significant for pneumonia.    Lab Results  Component Value Date   NA 135 05/31/2022   K 4.3 05/31/2022   CO2 24 05/31/2022   GLUCOSE 106 (H) 05/31/2022   BUN 15 05/31/2022   CREATININE 1.20 05/31/2022   CALCIUM 8.5 (L) 05/31/2022   EGFR 49 (L) 11/30/2021   GFRNONAA 59 (L) 05/31/2022   Lab Results  Component Value Date   CHOL 189 11/30/2021   HDL 43 11/30/2021   LDLCALC 120 (H) 11/30/2021   TRIG 143 11/30/2021   CHOLHDL 3.5 12/16/2015   No results found for: "TSH" No results found for: "HGBA1C" Lab Results  Component Value Date   WBC 12.1 (H) 05/31/2022   HGB 13.4 05/31/2022   HCT 41.7 05/31/2022   MCV 96.3 05/31/2022   PLT 672 (H) 05/31/2022   Lab Results  Component Value Date   ALT 54 (H) 05/31/2022   AST 37 05/31/2022   ALKPHOS 61 05/31/2022   BILITOT 0.7 05/31/2022   No results found for: "25OHVITD2", "25OHVITD3", "VD25OH"   Review of Systems  HENT:  Negative for rhinorrhea.   Respiratory:  Positive for shortness of breath. Negative for cough, chest tightness and wheezing.   Cardiovascular:  Positive for orthopnea. Negative for chest pain, palpitations, leg swelling and PND.    Patient Active Problem List   Diagnosis Date Noted   Hypokalemia 05/26/2022   Hyponatremia 05/25/2022   Acute hypoxemic respiratory failure ([place]) 05/25/2022   Multifocal  pneumonia 44/81/8563   Acute metabolic encephalopathy 14/97/0263   Falls 05/24/2022   Acute left-sided low back pain with left-sided sciatica 02/25/2020   Lumbar stenosis with neurogenic claudication 02/25/2020   Symptomatic anemia 09/03/2018   GI bleed 09/02/2018   Diverticulosis of colon with hemorrhage    Acute gastrointestinal hemorrhage    Hiatal hernia with GERD    Stomach irritation    Diverticulosis of small intestine without hemorrhage    HTN (hypertension) 08/30/2018   Blood in stool 08/30/2018   Deep vein thrombosis (DVT) of proximal vein of right lower extremity ([place]) 06/14/2017    Allergies  Allergen Reactions   Aspirin Rash   Sulfa Antibiotics Rash    Past Surgical History:  Procedure Laterality Date   COLONOSCOPY  2014   cleared for 5 yrs   COLONOSCOPY N/A 09/01/2018   Procedure: COLONOSCOPY;  Surgeon: Virgel Manifold, MD;  Location: [facility] ENDOSCOPY;  Service: Endoscopy;  Laterality: N/A;   CRANIECTOMY FOR DEPRESSED SKULL FRACTURE  1960'S   ESOPHAGOGASTRODUODENOSCOPY N/A 09/01/2018   Procedure: ESOPHAGOGASTRODUODENOSCOPY (EGD);  Surgeon: Virgel Manifold, MD;  Location: [facility] ENDOSCOPY;  Service: Endoscopy;  Laterality: N/A;   HERNIA REPAIR     X2   IVC FILTER REMOVAL N/A 07/03/2017   Procedure: IVC FILTER REMOVAL;  Surgeon: Algernon Huxley, MD;  Location: [place] [facility];  Service: Cardiovascular;  Laterality: N/A;   LUMBAR LAMINECTOMY/DECOMPRESSION  MICRODISCECTOMY N/A 04/20/2020   Procedure: L2/3, L3/4 LAMINECTOMY;  Surgeon: [name], MD;  Location: [facility] ORS;  Service: Neurosurgery;  Laterality: N/A;  2ND CASE   PERIPHERAL VASCULAR THROMBECTOMY Right 05/22/2017   Procedure: PERIPHERAL VASCULAR THROMBECTOMY;  Surgeon: Algernon Huxley, MD;  Location: [place] [facility];  Service: Cardiovascular;  Laterality: Right;   SPINE SURGERY  December 2021   TONSILLECTOMY      Social History   Tobacco Use   Smoking status: Former    Packs/day: 0.50     Years: 3.50    Total pack years: 1.75    Types: Cigarettes    Quit date: 01/01/1959    Years since quitting: 63.4   Smokeless tobacco: Never   Tobacco comments:    I smoked from about age 5 to 41.  Vaping Use   Vaping Use: Never used  Substance Use Topics   Alcohol use: Yes    Alcohol/week: 3.0 standard drinks of alcohol    Types: 3 Glasses of wine per week    Comment: once a week   Drug use: No     Medication list has been reviewed and updated.  Current Meds  Medication Sig   acetaminophen (TYLENOL) 500 MG tablet Take 1,000 mg by mouth every 6 (six) hours as needed for mild pain or moderate pain.    albuterol (VENTOLIN HFA) 108 (90 Base) MCG/ACT inhaler Inhale 2 puffs into the lungs every 4 (four) hours as needed for wheezing or shortness of breath.   benzonatate (TESSALON) 100 MG capsule Take 1 capsule (100 mg total) by mouth every 8 (eight) hours.   pantoprazole (PROTONIX) 40 MG tablet Take 1 tablet (40 mg total) by mouth daily.   potassium chloride (KLOR-CON M) 10 MEQ tablet Take 1 tablet (10 mEq total) by mouth 2 (two) times daily for 5 days.   promethazine-dextromethorphan (PROMETHAZINE-DM) 6.25-15 MG/5ML syrup Take 2.5 mLs by mouth at bedtime as needed.       06/02/2022    3:10 PM 11/30/2021    8:36 AM 07/02/2021   10:45 AM 11/27/2020    8:59 AM  GAD 7 : Generalized Anxiety Score  Nervous, Anxious, on Edge 0 0 0 0  Control/stop worrying 0 0 0 0  Worry too much - different things 0 0 0 0  Trouble relaxing 0 0 0 0  Restless 0 0 0 0  Easily annoyed or irritable 0 0 0 0  Afraid - awful might happen 0 0 0 0  Total GAD 7 Score 0 0 0 0  Anxiety Difficulty Not difficult at all Not difficult at all Not difficult at all        06/02/2022    3:10 PM 11/30/2021    8:36 AM 10/27/2021    8:20 AM  Depression screen PHQ 2/9  Decreased Interest 0 0 0  Down, Depressed, Hopeless 0 0 0  PHQ - 2 Score 0 0 0  Altered sleeping 0 1   Tired, decreased energy 0 1   Change in appetite 0 0    Feeling bad or failure about yourself  0 0   Trouble concentrating 0 0   Moving slowly or fidgety/restless 0 0   Suicidal thoughts 0 0   PHQ-9 Score 0 2   Difficult doing work/chores Not difficult at all Not difficult at all     BP Readings from Last 3 Encounters:  06/02/22 120/60  05/31/22 (!) 140/75  05/26/22 131/74    Physical Exam Constitutional:  Appearance: He is ill-appearing.  HENT:     Right Ear: Tympanic membrane normal.     Left Ear: Tympanic membrane normal.     Mouth/Throat:     Mouth: Mucous membranes are moist.  Cardiovascular:     Rate and Rhythm: Regular rhythm.     Heart sounds: No murmur heard.    No friction rub. No gallop.  Pulmonary:     Effort: No respiratory distress.     Breath sounds: No stridor. Rales present. No wheezing or rhonchi.     Comments: Bilateral rales Chest:     Chest wall: No tenderness.  Abdominal:     Tenderness: There is no abdominal tenderness.     Hernia: No hernia is present.     Wt Readings from Last 3 Encounters:  06/02/22 161 lb (73 kg)  05/31/22 165 lb (74.8 kg)  05/24/22 164 lb 3.9 oz (74.5 kg)    BP 120/60   Pulse 72   Ht '5\' 7"'$  (1.702 m)   Wt 161 lb (73 kg)   SpO2 95% Comment: was 87 when came back  BMI 25.22 kg/m   Assessment and Plan: This was supposed to be a follow-up hospitalization evaluation but patient is ill-appearing and is continuing to have shortness of breath and he has just completed his course of antibiotics.  Since returning home from the ER patient has had gradual decrease in breathing. 1. Shortness of breath Continued shortness of breath gradually worsens particularly with walking pulse ox desaturates down to 85 and then returns when he is back at rest to [id number] we have arranged for pulmonary evaluation and that this is behaving like post COVID even though he has never had positive for COVID we will obtain a chest x-ray and a CBC and we will proceed with evaluation in the morning with  pulmonary. - Ambulatory referral to Pulmonology - DG Chest 2 View  2. Multifocal pneumonia As noted multifocal pneumonia has the appearance of a bilateral bacterial infection versus COVID needs further attention as to whether the this is a slow recovery versus another process and whether or not patient needs to have supplemental oxygen at home. - DG Chest 2 View    Otilio Miu, MD

## 2022-06-03 ENCOUNTER — Inpatient Hospital Stay (HOSPITAL_COMMUNITY)
Admit: 2022-06-03 | Discharge: 2022-06-03 | Disposition: A | Discharge status: Home / Self Care | Payer: Medicare PPO | Attending: Internal Medicine | Admitting: Internal Medicine

## 2022-06-03 ENCOUNTER — Other Ambulatory Visit: Payer: Self-pay

## 2022-06-03 DIAGNOSIS — R0602 Shortness of breath: Secondary | ICD-10-CM

## 2022-06-03 DIAGNOSIS — I272 Pulmonary hypertension, unspecified: Secondary | ICD-10-CM

## 2022-06-03 DIAGNOSIS — J189 Pneumonia, unspecified organism: Secondary | ICD-10-CM

## 2022-06-03 LAB — ECHOCARDIOGRAM COMPLETE
AR max vel: 2.99 cm2
AV Area VTI: 2.81 cm2
AV Area mean vel: 2.92 cm2
AV Mean grad: 2 mmHg
AV Peak grad: 4.3 mmHg
Ao pk vel: 1.04 m/s
Area-P 1/2: 3.74 cm2
Height: 67 in
MV VTI: 2.6 cm2
P 1/2 time: 441 msec
S' Lateral: 2.55 cm
Weight: 2640 oz

## 2022-06-03 LAB — BLOOD GAS, VENOUS
Acid-Base Excess: 2.6 mmol/L — ABNORMAL HIGH (ref 0.0–2.0)
Bicarbonate: 27.2 mmol/L (ref 20.0–28.0)
O2 Saturation: 50.8 %
Patient temperature: 37
pCO2, Ven: 41 mmHg — ABNORMAL LOW (ref 44–60)
pH, Ven: 7.43 (ref 7.25–7.43)
pO2, Ven: 33 mmHg (ref 32–45)

## 2022-06-03 LAB — BASIC METABOLIC PANEL
Anion gap: 7 (ref 5–15)
BUN: 12 mg/dL (ref 8–23)
CO2: 24 mmol/L (ref 22–32)
Calcium: 8.2 mg/dL — ABNORMAL LOW (ref 8.9–10.3)
Chloride: 106 mmol/L (ref 98–111)
Creatinine, Ser: 1.16 mg/dL (ref 0.61–1.24)
GFR, Estimated: >60 mL/min (ref >60)
Glucose, Bld: 98 mg/dL (ref 70–99)
Potassium: 3.9 mmol/L (ref 3.5–5.1)
Sodium: 137 mmol/L (ref 135–145)

## 2022-06-03 LAB — CBC WITH DIFFERENTIAL/PLATELET
Abs Immature Granulocytes: 0.04 10*3/uL (ref 0.00–0.07)
Basophils Absolute: 0.1 10*3/uL (ref 0.0–0.1)
Basophils Relative: 1 %
Eosinophils Absolute: 0.2 10*3/uL (ref 0.0–0.5)
Eosinophils Relative: 2 %
HCT: 38.7 % — ABNORMAL LOW (ref 39.0–52.0)
Hemoglobin: 12.3 g/dL — ABNORMAL LOW (ref 13.0–17.0)
Immature Granulocytes: 1 %
Lymphocytes Relative: 19 %
Lymphs Abs: 1.6 10*3/uL (ref 0.7–4.0)
MCH: 30.5 pg (ref 26.0–34.0)
MCHC: 31.8 g/dL (ref 30.0–36.0)
MCV: 96 fL (ref 80.0–100.0)
Monocytes Absolute: 0.8 10*3/uL (ref 0.1–1.0)
Monocytes Relative: 9 %
Neutro Abs: 5.8 10*3/uL (ref 1.7–7.7)
Neutrophils Relative %: 68 %
Platelets: 648 10*3/uL — ABNORMAL HIGH (ref 150–400)
RBC: 4.03 MIL/uL — ABNORMAL LOW (ref 4.22–5.81)
RDW: 13 % (ref 11.5–15.5)
WBC: 8.5 10*3/uL (ref 4.0–10.5)
nRBC: 0 % (ref 0.0–0.2)

## 2022-06-03 LAB — TROPONIN I (HIGH SENSITIVITY): Troponin I (High Sensitivity): 19 ng/L — ABNORMAL HIGH (ref <18)

## 2022-06-03 LAB — RESP PANEL BY RT-PCR (RSV, FLU A&B, COVID)  RVPGX2
Influenza A by PCR: NEGATIVE
Influenza B by PCR: NEGATIVE
Resp Syncytial Virus by PCR: NEGATIVE
SARS Coronavirus 2 by RT PCR: NEGATIVE

## 2022-06-03 LAB — MRSA NEXT GEN BY PCR, NASAL: MRSA by PCR Next Gen: NOT DETECTED

## 2022-06-03 LAB — SEDIMENTATION RATE: Sed Rate: 61 mm/hr — ABNORMAL HIGH (ref 0–20)

## 2022-06-03 MED ORDER — SODIUM CHLORIDE 0.9 % IV SOLN
100.0000 mg | Freq: Two times a day (BID) | INTRAVENOUS | Status: DC
  Filled 2022-06-03 (×2): qty 100

## 2022-06-03 MED ORDER — DOXYCYCLINE HYCLATE 100 MG PO TABS
100.0000 mg | ORAL_TABLET | Freq: Two times a day (BID) | ORAL | Status: DC
  Administered 2022-06-03 – 2022-06-04 (×3): 100 mg via ORAL
  Filled 2022-06-03 (×3): qty 1

## 2022-06-03 MED ORDER — SODIUM CHLORIDE 0.9 % IV SOLN: 1.0000 g | INTRAVENOUS | Status: DC

## 2022-06-03 MED ORDER — RISAQUAD PO CAPS
2.0000 | ORAL_CAPSULE | Freq: Three times a day (TID) | ORAL | Status: DC
  Administered 2022-06-03 – 2022-06-04 (×4): 2 via ORAL
  Filled 2022-06-03 (×4): qty 2

## 2022-06-03 MED ORDER — SODIUM CHLORIDE 0.9 % IV SOLN: 100.0000 mg | Freq: Two times a day (BID) | INTRAVENOUS | Status: DC

## 2022-06-03 MED ORDER — SODIUM CHLORIDE 0.9 % IV SOLN
2.0000 g | Freq: Two times a day (BID) | INTRAVENOUS | Status: DC
  Administered 2022-06-03: 2 g via INTRAVENOUS
  Filled 2022-06-03: qty 12.5

## 2022-06-03 NOTE — Assessment & Plan Note (Addendum)
Suspect pt needs HRCT and PFT. Will defer to Pulmonology for eval and management.  PRN albuterol.  Cont cefepime and doxycycline.

## 2022-06-03 NOTE — Progress Notes (Signed)
[facility] NAME: Clayton [name]    MR#:  [id number]  DATE OF BIRTH:  01/29/1935  SUBJECTIVE:  patient seen earlier in the ER. Wife at bedside. Came in with shortness of breath and AC fatigue elevated. Patient was recently discharged from the hospital after multifocal pneumonia was diagnosed. His wife reports he has had two rounds of antibiotics recently. No wheezing or respiratory distress. Poor wife his oxygen saturations are dropping at home. Patient denies any chest pain. He does have some dry cough which tires him out.    VITALS:  Blood pressure (!) 130/94, pulse 79, temperature (!) 97.5 F (36.4 C), resp. rate 18, height '5\' 7"'$  (1.702 m), weight 74.8 kg, SpO2 97 %.  PHYSICAL EXAMINATION:   GENERAL:  88 y.o.-year-old patient with no acute distress.  LUNGS: Normal breath sounds bilaterally, no wheezing CARDIOVASCULAR: S1, S2 normal. No murmur   ABDOMEN: Soft, nontender, nondistended. Bowel sounds present.  EXTREMITIES: No  edema b/l.    NEUROLOGIC: nonfocal  patient is alert and awake SKIN: No obvious rash, lesion, or ulcer.   LABORATORY PANEL:  CBC Recent Labs  Lab 06/03/22 0139  WBC 8.5  HGB 12.3*  HCT 38.7*  PLT 648*    Chemistries  Recent Labs  Lab 05/31/22 0026 06/02/22 2009 06/03/22 0139  NA 135   < > 137  K 4.3   < > 3.9  CL 101   < > 106  CO2 24   < > 24  GLUCOSE 106*   < > 98  BUN 15   < > 12  CREATININE 1.20   < > 1.16  CALCIUM 8.5*   < > 8.2*  AST 37  --   --   ALT 54*  --   --   ALKPHOS 61  --   --   BILITOT 0.7  --   --    < > = values in this interval not displayed.   Cardiac Enzymes No results for input(s): "TROPONINI" in the last 168 hours. RADIOLOGY:  ECHOCARDIOGRAM COMPLETE  Result Date: 06/03/2022    ECHOCARDIOGRAM REPORT   Patient Name:   Clayton [name] Adan Date of Exam: 06/03/2022 Medical Rec #:  [id number]           Height:       67.0 in Accession #:    [id number]           Weight:       165.0 lb Date of Birth:  [birth year]           BSA:          1.863 m Patient Age:    51 years            BP:           147/72 mmHg Patient Gender: M                   HR:           70 bpm. Exam Location:  [facility] Procedure: 2D Echo, Cardiac Doppler and Color Doppler Indications:     Shortness of Breath, Pulmonary HTN  History:         Patient has no prior history of Echocardiogram examinations.                  Pulmonary HTN, Signs/Symptoms:Shortness of Breath; Risk  Factors:Hypertension.  Sonographer:     Wenda Low Referring Phys:  [place] Diagnosing Phys: Ida Rogue MD IMPRESSIONS  1. Left ventricular ejection fraction, by estimation, is 60 to 65%. The left ventricle has normal function. The left ventricle has no regional wall motion abnormalities. Left ventricular diastolic parameters are consistent with Grade I diastolic dysfunction (impaired relaxation).  2. Right ventricular systolic function is normal. The right ventricular size is normal. There is normal pulmonary artery systolic pressure. The estimated right ventricular systolic pressure is 47.4 mmHg.  3. The mitral valve is normal in structure. Mild to moderate mitral valve regurgitation. No evidence of mitral stenosis.  4. The aortic valve is tricuspid. Aortic valve regurgitation is mild to moderate. No aortic stenosis is present.  5. The inferior vena cava is normal in size with greater than 50% respiratory variability, suggesting right atrial pressure of 3 mmHg. FINDINGS  Left Ventricle: Left ventricular ejection fraction, by estimation, is 60 to 65%. The left ventricle has normal function. The left ventricle has no regional wall motion abnormalities. The left ventricular internal cavity size was normal in size. There is  no left ventricular hypertrophy. Left ventricular diastolic parameters are consistent with Grade I diastolic dysfunction (impaired relaxation). Right Ventricle: The right ventricular  size is normal. No increase in right ventricular wall thickness. Right ventricular systolic function is normal. There is normal pulmonary artery systolic pressure. The tricuspid regurgitant velocity is 2.58 m/s, and  with an assumed right atrial pressure of 5 mmHg, the estimated right ventricular systolic pressure is 25.9 mmHg. Left Atrium: Left atrial size was normal in size. Right Atrium: Right atrial size was normal in size. Pericardium: There is no evidence of pericardial effusion. Mitral Valve: The mitral valve is normal in structure. There is mild calcification of the mitral valve leaflet(s). Mild to moderate mitral valve regurgitation. No evidence of mitral valve stenosis. MV peak gradient, 3.3 mmHg. The mean mitral valve gradient is 2.0 mmHg. Tricuspid Valve: The tricuspid valve is normal in structure. Tricuspid valve regurgitation is mild . No evidence of tricuspid stenosis. Aortic Valve: The aortic valve is tricuspid. Aortic valve regurgitation is mild to moderate. Aortic regurgitation PHT measures 441 msec. No aortic stenosis is present. Aortic valve mean gradient measures 2.0 mmHg. Aortic valve peak gradient measures 4.3 mmHg. Aortic valve area, by VTI measures 2.81 cm. Pulmonic Valve: The pulmonic valve was normal in structure. Pulmonic valve regurgitation is not visualized. No evidence of pulmonic stenosis. Aorta: The aortic root is normal in size and structure. Venous: The inferior vena cava is normal in size with greater than 50% respiratory variability, suggesting right atrial pressure of 3 mmHg. IAS/Shunts: No atrial level shunt detected by color flow Doppler.  LEFT VENTRICLE PLAX 2D LVIDd:         4.00 cm   Diastology LVIDs:         2.55 cm   LV e' medial:    7.07 cm/s LV PW:         0.90 cm   LV E/e' medial:  11.7 LV IVS:        1.10 cm   LV e' lateral:   10.30 cm/s LVOT diam:     2.00 cm   LV E/e' lateral: 8.0 LV SV:         72 LV SV Index:   39 LVOT Area:     3.14 cm  RIGHT VENTRICLE RV Basal  diam:  3.15 cm RV Mid diam:  3.10 cm RV S prime:     14.70 cm/s TAPSE (M-mode): 2.4 cm LEFT ATRIUM             Index        RIGHT ATRIUM           Index LA diam:        3.20 cm 1.72 cm/m   RA Area:     18.60 cm LA Vol (A2C):   45.6 ml 24.47 ml/m  RA Volume:   35.80 ml  19.21 ml/m LA Vol (A4C):   49.3 ml 26.46 ml/m LA Biplane Vol: 46.9 ml 25.17 ml/m  AORTIC VALVE                    PULMONIC VALVE AV Area (Vmax):    2.99 cm     PV Vmax:       1.25 m/s AV Area (Vmean):   2.92 cm     PV Peak grad:  6.2 mmHg AV Area (VTI):     2.81 cm AV Vmax:           104.00 cm/s AV Vmean:          66.600 cm/s AV VTI:            0.257 m AV Peak Grad:      4.3 mmHg AV Mean Grad:      2.0 mmHg LVOT Vmax:         99.00 cm/s LVOT Vmean:        61.900 cm/s LVOT VTI:          0.230 m LVOT/AV VTI ratio: 0.89 AI PHT:            441 msec  AORTA Ao Root diam: 3.50 cm Ao Asc diam:  3.70 cm MITRAL VALVE               TRICUSPID VALVE MV Area (PHT): 3.74 cm    TR Peak grad:   26.6 mmHg MV Area VTI:   2.60 cm    TR Vmax:        258.00 cm/s MV Peak grad:  3.3 mmHg MV Mean grad:  2.0 mmHg    SHUNTS MV Vmax:       0.91 m/s    Systemic VTI:  0.23 m MV Vmean:      60.9 cm/s   Systemic Diam: 2.00 cm MV Decel Time: 203 msec MV E velocity: 82.80 cm/s MV A velocity: 89.10 cm/s MV E/A ratio:  0.93 Ida Rogue MD Electronically signed by Ida Rogue MD Signature Date/Time: 06/03/2022/3:31:32 PM    Final    CT Head Wo Contrast  Result Date: 06/02/2022 CLINICAL DATA:  Altered mental status, shortness of breath EXAM: CT HEAD WITHOUT CONTRAST TECHNIQUE: Contiguous axial images were obtained from the base of the skull through the vertex without intravenous contrast. RADIATION DOSE REDUCTION: This exam was performed according to the departmental dose-optimization program which includes automated exposure control, adjustment of the mA and/or kV according to patient size and/or use of iterative reconstruction technique. COMPARISON:  05/24/2022  FINDINGS: Brain: No evidence of acute infarction, hemorrhage, hydrocephalus, extra-axial collection or mass lesion/mass effect. Subcortical white matter and periventricular small vessel ischemic changes. Vascular: Mild intracranial atherosclerosis. Skull: Normal. Negative for fracture or focal lesion. Sinuses/Orbits: The visualized paranasal sinuses are essentially clear. The mastoid air cells are unopacified. Other: None. IMPRESSION: No evidence of acute intracranial abnormality. Small vessel ischemic changes. Electronically Signed   By: Bertis Ruddy  [place] Pink M.D.   On: 06/02/2022 22:17   DG Chest 1 View  Result Date: 06/02/2022 CLINICAL DATA:  Shortness of breath EXAM: CHEST  1 VIEW COMPARISON:  06/02/2022 at 4:10 p.m. FINDINGS: Interval increase in prominence of bilateral patchy airspace and interstitial opacities compared with radiographs earlier today at 4:10 p.m. This may be due to decreased lung volumes. No pleural effusion or pneumothorax. Stable cardiomediastinal silhouette. Aortic atherosclerotic calcification. No acute osseous abnormality. IMPRESSION: Bilateral airspace opacities compatible with multifocal pneumonia. Electronically Signed   By: Placido Sou M.D.   On: 06/02/2022 20:35   DG Chest 2 View  Result Date: 06/02/2022 CLINICAL DATA:  Persistent dyspnea status post pneumonia. EXAM: CHEST - 2 VIEW COMPARISON:  05/31/2022 FINDINGS: Stable cardiomediastinal contours. Large hiatal hernia. No pleural effusion or interstitial edema. Bilateral peripheral and left lung base airspace opacities are identified. Opacity in the left base is unchanged from previous exam. Right upper lobe and left midlung opacities are stable to mildly improved in the interval. IMPRESSION: 1. Persistent bilateral peripheral and left lung base airspace opacities compatible with multifocal pneumonia. 2. Right upper lobe and left midlung opacities are stable to mildly improved in the interval. Electronically Signed   By:  Kerby Moors M.D.   On: 06/02/2022 16:34    Assessment and Plan ISHAM SMITHERMAN is a 88 y.o. male with past medical history of hypertension, history of TBI, DVT who presents to the ED complaining of shortness of breath.  Patient was recently admitted to the hospital for multifocal pneumonia, subsequently completed treatment with cefdinir and azithromycin.  Wife states that he was confused at the time of admission but his mental status has seemed to worsen since he has returned home.  He is also having intermittent episodes of difficulty breathing where he will start breathing very fast and report feeling short of breath.   Patient ambulated earlier in the ER with occupational therapist sats drop down to 84% however recovered quickly on room air 94%. No history of COPD.   Community acquired pneumonia-- multifocal -- patient currently 96 to 99% on room air. -- He had transient hypoxia while ambulating in the emergency room with occupational therapist. Patient was breathing through his mouth with shallow breath. Recovered to 94%. -- Given second visit to the ER since discharge in January will monitor for requirement of home oxygen.-- Discussed with Dr.Aleskerov-- recommends doxycycline for five days. -- Follow-up bloodwork ordered by pulmonology as outpatient --pt is afebrile white count is normal. --Echo shows EF 60-65% --will assess for home oxygen need --prn inhalers -respiratory cultures pending. -- PCR for flu, RSV negative  Hiatal hernia with Jerrye Bushy -- continue PPI  Hypertension -- BP stable. -- Patient not on any meds. Continue to monitor  Family communication :wife Consults :Pulmonary CODE STATUS: full DVT Prophylaxis :heparin Level of care: Telemetry Medical Status is: Inpatient Remains inpatient appropriate because: CAP Lik[facility] d/c in am    TOTAL TIME TAKING CARE OF THIS PATIENT: 35 minutes.  >50% time spent on counselling and coordination of care  Note: This  dictation was prepared with Dragon dictation along with smaller phrase technology. Any transcriptional errors that result from this process are unintentional.  Fritzi Mandes M.D    [facility]   CC: Primary care physician; Juline Patch, MD

## 2022-06-03 NOTE — Assessment & Plan Note (Signed)
SpO2: 95 % Pt started on 2 L [place]. We will follow.

## 2022-06-03 NOTE — H&P (Signed)
History and Physical    Chief Complaint: SOB.   HISTORY OF PRESENT ILLNESS: Clayton [name] is an 88 y.o. male  brought by wife for SOB. Pt has been admitted earlier this month for multifocal pna. Pt has been SOB and intermittently weak and is a Designer, jewellery and wear protective PPE. Pt states he is SOB, no fever or chills.  His clinical picture is same as before with same findings previously.  Pt has pulm appt in am but was so sob he came to hospital. While examining him he was hypoxic at rest.    Pt has  Past Medical History:  Diagnosis Date   Acute gastrointestinal hemorrhage    Acute hypoxemic respiratory failure ([place]) 05/25/2022   Basal cell carcinoma    Blood in stool 08/30/2018   Blood transfusion without reported diagnosis 86 (age 45)   After traumatic brain injury   Broken ribs    2 BROKEN ON RIBS   CKD (chronic kidney disease)    PT DENIES   DVT (deep venous thrombosis) (HCC)    RIGHT LEG   Fracture dislocation of wrist    RIGHT   GI bleed 09/02/2018   Hiatal hernia with GERD    HTN (hypertension)    Hypokalemia 05/26/2022   Hyponatremia 05/25/2022   Symptomatic anemia 09/03/2018   Review of Systems  Constitutional:  Positive for fatigue.  Respiratory:  Positive for shortness of breath.   Neurological:  Positive for weakness.  Psychiatric/Behavioral:  Positive for confusion.    Allergies  Allergen Reactions   Aspirin Rash   Sulfa Antibiotics Rash   Past Surgical History:  Procedure Laterality Date   COLONOSCOPY  2014   cleared for 5 yrs   COLONOSCOPY N/A 09/01/2018   Procedure: COLONOSCOPY;  Surgeon: Virgel Manifold, MD;  Location: [facility] ENDOSCOPY;  Service: Endoscopy;  Laterality: N/A;   CRANIECTOMY FOR DEPRESSED SKULL FRACTURE  1960'S   ESOPHAGOGASTRODUODENOSCOPY N/A 09/01/2018   Procedure: ESOPHAGOGASTRODUODENOSCOPY (EGD);  Surgeon: Virgel Manifold, MD;  Location: [facility] ENDOSCOPY;  Service: Endoscopy;  Laterality: N/A;   HERNIA  REPAIR     X2   IVC FILTER REMOVAL N/A 07/03/2017   Procedure: IVC FILTER REMOVAL;  Surgeon: Algernon Huxley, MD;  Location: [place] CV LAB;  Service: Cardiovascular;  Laterality: N/A;   LUMBAR LAMINECTOMY/DECOMPRESSION MICRODISCECTOMY N/A 04/20/2020   Procedure: L2/3, L3/4 LAMINECTOMY;  Surgeon: [name], MD;  Location: [facility] ORS;  Service: Neurosurgery;  Laterality: N/A;  2ND CASE   PERIPHERAL VASCULAR THROMBECTOMY Right 05/22/2017   Procedure: PERIPHERAL VASCULAR THROMBECTOMY;  Surgeon: Algernon Huxley, MD;  Location: [place] CV LAB;  Service: Cardiovascular;  Laterality: Right;   SPINE SURGERY  December 2021   TONSILLECTOMY         MEDICATIONS: Current Outpatient Medications  Medication Instructions   acetaminophen (TYLENOL) 1,000 mg, Oral, Every 6 hours PRN   albuterol (VENTOLIN HFA) 108 (90 Base) MCG/ACT inhaler 2 puffs, Inhalation, Every 4 hours PRN   benzonatate (TESSALON) 100 mg, Oral, Every 8 hours   pantoprazole (PROTONIX) 40 mg, Oral, Daily   potassium chloride (KLOR-CON M) 10 MEQ tablet 10 mEq, Oral, 2 times daily   promethazine-dextromethorphan (PROMETHAZINE-DM) 6.25-15 MG/5ML syrup 2.5 mLs, Oral, At bedtime PRN     acidophilus  2 capsule Oral TID   heparin  5,000 Units Subcutaneous Q8H     sodium chloride     ceFEPime (MAXIPIME) IV     doxycycline (VIBRAMYCIN) IV  acetaminophen **OR** acetaminophen, albuterol, guaiFENesin, hydrALAZINE, ondansetron **OR** ondansetron (ZOFRAN) IV    ED Course: Pt in Ed is alert and oriented. Started on oxygen at 2 L [place]. Vitals:   06/02/22 2000 06/02/22 2001  BP: (!) 165/75   Pulse: 65   Resp: 20   Temp: 98 F (36.7 C)   TempSrc: Oral   SpO2: 95%   Weight:  74.8 kg  Height:  '5\' 7"'$  (1.702 m)    No intake/output data recorded. SpO2: 95 % Blood work in ed shows: Results for orders placed or performed during the hospital encounter of 06/02/22 (from the past 24 hour(s))  Basic metabolic panel     Status:  Abnormal   Collection Time: 06/02/22  8:09 PM  Result Value Ref Range   Sodium 137 135 - 145 mmol/L   Potassium 4.1 3.5 - 5.1 mmol/L   Chloride 106 98 - 111 mmol/L   CO2 20 (L) 22 - 32 mmol/L   Glucose, Bld 103 (H) 70 - 99 mg/dL   BUN 13 8 - 23 mg/dL   Creatinine, Ser 1.17 0.61 - 1.24 mg/dL   Calcium 8.6 (L) 8.9 - 10.3 mg/dL   GFR, Estimated >60 >60 mL/min   Anion gap 11 5 - 15  CBC     Status: Abnormal   Collection Time: 06/02/22  8:09 PM  Result Value Ref Range   WBC 10.0 4.0 - 10.5 K/uL   RBC 4.36 4.22 - 5.81 MIL/uL   Hemoglobin 13.3 13.0 - 17.0 g/dL   HCT 41.7 39.0 - 52.0 %   MCV 95.6 80.0 - 100.0 fL   MCH 30.5 26.0 - 34.0 pg   MCHC 31.9 30.0 - 36.0 g/dL   RDW 13.1 11.5 - 15.5 %   Platelets 710 (H) 150 - 400 K/uL   nRBC 0.0 0.0 - 0.2 %  Troponin I (High Sensitivity)     Status: Abnormal   Collection Time: 06/02/22  8:09 PM  Result Value Ref Range   Troponin I (High Sensitivity) 18 (H) <18 ng/L  Procalcitonin     Status: None   Collection Time: 06/02/22  8:09 PM  Result Value Ref Range   Procalcitonin <0.10 ng/mL  Resp panel by RT-PCR (RSV, Flu A&B, Covid) Anterior Nasal Swab     Status: None   Collection Time: 06/03/22 12:38 AM   Specimen: Anterior Nasal Swab  Result Value Ref Range   SARS Coronavirus 2 by RT PCR NEGATIVE NEGATIVE   Influenza A by PCR NEGATIVE NEGATIVE   Influenza B by PCR NEGATIVE NEGATIVE   Resp Syncytial Virus by PCR NEGATIVE NEGATIVE    Unresulted Labs (From admission, onward)     Start     Ordered   06/03/22 7062  Basic metabolic panel  Tomorrow morning,   R        06/02/22 2353   06/03/22 0500  CBC with Differential/Platelet  Tomorrow morning,   R        06/03/22 0013   06/02/22 2258  MRSA Next Gen by PCR, Nasal  (COPD / Pneumonia / Cellulitis / Lower Extremity Wound)  ONCE - URGENT,   URGENT        06/02/22 2257   06/02/22 2253  Culture, blood (Routine X 2) w Reflex to ID Panel  (COPD / Pneumonia / Cellulitis / Lower Extremity Wound)   BLOOD CULTURE X 2,   STAT      06/02/22 2255   06/02/22 2253  Expectorated Sputum Assessment  w Gram Stain, Rflx to Resp Cult  (COPD / Pneumonia / Cellulitis / Lower Extremity Wound)  ONCE - URGENT,   URGENT        06/02/22 2255   06/02/22 2253  Legionella Urine Antigen  (COPD / Pneumonia / Cellulitis / Lower Extremity Wound)  ONCE - URGENT,   URGENT        06/02/22 2255   06/02/22 2252  Strep pneumoniae urinary antigen  (COPD / Pneumonia / Cellulitis / Lower Extremity Wound)  Once,   URGENT        06/02/22 2255   06/02/22 2252  Expectorated Sputum Assessment w Gram Stain, Rflx to Resp Cult  (COPD / Pneumonia / Cellulitis / Lower Extremity Wound)  Once,   URGENT        06/02/22 2255           Pt has received : Orders Placed This Encounter  Procedures   Resp panel by RT-PCR (RSV, Flu A&B, Covid) Anterior Nasal Swab    Standing Status:   Standing    Number of Occurrences:   1   Expectorated Sputum Assessment w Gram Stain, Rflx to Resp Cult    Standing Status:   Standing    Number of Occurrences:   1   Culture, blood (Routine X 2) w Reflex to ID Panel    Standing Status:   Standing    Number of Occurrences:   2   Expectorated Sputum Assessment w Gram Stain, Rflx to Resp Cult    Standing Status:   Standing    Number of Occurrences:   1   MRSA Next Gen by PCR, Nasal    Standing Status:   Standing    Number of Occurrences:   1   DG Chest 1 View    If patient pregnant, contact provider.    Standing Status:   Standing    Number of Occurrences:   1    Order Specific Question:   Symptom/Reason for Exam    Answer:   SOB (shortness of breath) [789381]   CT Head Wo Contrast    Standing Status:   Standing    Number of Occurrences:   1   Basic metabolic panel    Standing Status:   Standing    Number of Occurrences:   1   CBC    Standing Status:   Standing    Number of Occurrences:   1   Procalcitonin    Standing Status:   Standing    Number of Occurrences:   1   Strep pneumoniae  urinary antigen    Standing Status:   Standing    Number of Occurrences:   1   Legionella Urine Antigen    Standing Status:   Standing    Number of Occurrences:   1   Basic metabolic panel    Standing Status:   Standing    Number of Occurrences:   1   CBC with Differential/Platelet    Standing Status:   Standing    Number of Occurrences:   1   Document Height and Actual Weight    Use scales to weigh patient, not stated or estimated weight.    Standing Status:   Standing    Number of Occurrences:   1   Swallow screen    Standing Status:   Standing    Number of Occurrences:   1   Apply Pneumonia Care Plan    Standing Status:  Standing    Number of Occurrences:   1   Cardiac Monitoring Continuous x 24 hours Indications for use: Other; other indications for use: PNA    Standing Status:   Standing    Number of Occurrences:   1    Order Specific Question:   Indications for use:    Answer:   Other    Order Specific Question:   other indications for use:    Answer:   PNA   Vital signs    Standing Status:   Standing    Number of Occurrences:   1   Notify physician (specify)    Standing Status:   Standing    Number of Occurrences:   20    Order Specific Question:   Notify Physician    Answer:   for pulse less than 55 or greater than 120    Order Specific Question:   Notify Physician    Answer:   for respiratory rate less than 12 or greater than 25    Order Specific Question:   Notify Physician    Answer:   for temperature greater than 100.5 F    Order Specific Question:   Notify Physician    Answer:   for urinary output less than 30 mL/hr for four hours    Order Specific Question:   Notify Physician    Answer:   for systolic BP less than 90 or greater than 245, diastolic BP less than 60 or greater than 100    Order Specific Question:   Notify Physician    Answer:   for new hypoxia w/ oxygen saturations < 88%   Progressive Mobility Protocol: No Restrictions    Standing Status:    Standing    Number of Occurrences:   1   Intake and Output    Standing Status:   Standing    Number of Occurrences:   1   Initiate Oral Care Protocol    Standing Status:   Standing    Number of Occurrences:   1   Initiate Carrier Fluid Protocol    Standing Status:   Standing    Number of Occurrences:   1   RN may order General Admission PRN Orders utilizing "General Admission PRN medications" (through manage orders) for the following patient needs: allergy symptoms (Claritin), cold sores (Carmex), cough (Robitussin DM), eye irritation (Liquifilm Tears), hemorrhoids (Tucks), indigestion (Maalox), minor skin irritation (Hydrocortisone Cream), muscle pain (Ben Gay), nose irritation (saline nasal spray) and sore throat (Chloraseptic spray).    Standing Status:   Standing    Number of Occurrences:   [phone]   Full code    Standing Status:   Standing    Number of Occurrences:   1    Order Specific Question:   By:    Answer:   Other   Consult to hospitalist    Standing Status:   Standing    Number of Occurrences:   1    Order Specific Question:   Place call to:    Answer:   [phone]    Order Specific Question:   Reason for Consult    Answer:   Admit   Consult to respiratory care treatment (RT)    Standing Status:   Standing    Number of Occurrences:   1    Order Specific Question:   Reason for Consult?    Answer:   Evaluation   Consult to pulmonology Consult Timeframe: ROUTINE - requires response within  24 hours; Reason for Consult? MULTIFOCAL PNA ? REC OR IUNRESOLVING / ? mrsa ? HAS AN APPT WITH DR Raul Del AT 9 TODAY.    Standing Status:   Standing    Number of Occurrences:   1    Order Specific Question:   Consult Timeframe    Answer:   ROUTINE - requires response within 24 hours    Order Specific Question:   Reason for Consult?    Answer:   MULTIFOCAL PNA ? REC OR IUNRESOLVING / ? mrsa ? HAS AN APPT WITH DR Raul Del AT 9 TODAY.   CeFEPIme (MAXIPIME) per pharmacy consult              Standing Status:   Standing    Number of Occurrences:   1    Order Specific Question:   Antibiotic Indication:    Answer:   HCAP   Airborne and Contact precautions    Standing Status:   Standing    Number of Occurrences:   1   OT eval and treat    Standing Status:   Standing    Number of Occurrences:   1   PT eval and treat    Standing Status:   Standing    Number of Occurrences:   1   Pulse oximetry check with vital signs    Standing Status:   Standing    Number of Occurrences:   1   Oxygen therapy Mode or (Route): Nasal cannula; Liters Per Minute: 2; Keep 02 saturation: greater than 92 %    Standing Status:   Standing    Number of Occurrences:   1    Order Specific Question:   Mode or (Route)    Answer:   Nasal cannula    Order Specific Question:   Liters Per Minute    Answer:   2    Order Specific Question:   Keep 02 saturation    Answer:   greater than 92 %   Incentive spirometry    Standing Status:   Standing    Number of Occurrences:   1   ED EKG    Standing Status:   Standing    Number of Occurrences:   1    Order Specific Question:   Reason for Exam    Answer:   Shortness of breath   ECHOCARDIOGRAM COMPLETE    Standing Status:   Standing    Number of Occurrences:   1    Order Specific Question:   Perflutren DEFINITY (image enhancing agent) should be administered unless hypersensitivity or allergy exist    Answer:   Administer Perflutren    Order Specific Question:   Reason for exam-Echo    Answer:   Other-Full Diagnosis List    Order Specific Question:   Full ICD-10/Reason for Exam    Answer:   SOB (shortness of breath) [161096]    Order Specific Question:   Full ICD-10/Reason for Exam    Answer:   Pulmonary hypertension ([place]) [045409]   Admit to Inpatient (patient's expected length of stay will be greater than 2 midnights or inpatient only procedure)    Standing Status:   Standing    Number of Occurrences:   1    Order Specific Question:   Hospital Area     Answer:   [place] [100120]    Order Specific Question:   Level of Care    Answer:   Telemetry Medical [104]    Order  Specific Question:   Covid Evaluation    Answer:   Asymptomatic - no recent exposure (last 10 days) testing not required    Order Specific Question:   Diagnosis    Answer:   SOB (shortness of breath) [882800]    Order Specific Question:   Admitting Physician    Answer:   Cherylann Ratel    Order Specific Question:   Attending Physician    Answer:   Cherylann Ratel    Order Specific Question:   Certification:    Answer:   I certify this patient will need inpatient services for at least 2 midnights    Order Specific Question:   Estimated Length of Stay    Answer:   3    Meds ordered this encounter  Medications   DISCONTD: doxycycline (VIBRAMYCIN) 100 mg in sodium chloride 0.9 % 250 mL IVPB    Order Specific Question:   Antibiotic Indication:    Answer:   CAP   DISCONTD: ceFEPIme (MAXIPIME) 1 g in sodium chloride 0.9 % 100 mL IVPB    Order Specific Question:   Antibiotic Indication:    Answer:   HCAP   DISCONTD: albuterol (VENTOLIN HFA) 108 (90 Base) MCG/ACT inhaler 2 puff   0.9 %  sodium chloride infusion   OR Linked Order Group    acetaminophen (TYLENOL) tablet 650 mg    acetaminophen (TYLENOL) suppository 650 mg   OR Linked Order Group    ondansetron (ZOFRAN) tablet 4 mg    ondansetron (ZOFRAN) injection 4 mg   albuterol (PROVENTIL) (2.5 MG/3ML) 0.083% nebulizer solution 2.5 mg   guaiFENesin (MUCINEX) 12 hr tablet 600 mg   hydrALAZINE (APRESOLINE) injection 5 mg   DISCONTD: cefTRIAXone (ROCEPHIN) 2 g in sodium chloride 0.9 % 100 mL IVPB    Order Specific Question:   Antibiotic Indication:    Answer:   CAP   DISCONTD: azithromycin (ZITHROMAX) 500 mg in sodium chloride 0.9 % 250 mL IVPB    Order Specific Question:   Antibiotic Indication:    Answer:   CAP   heparin injection 5,000 Units   DISCONTD: vancomycin (VANCOREADY)  IVPB 1500 mg/300 mL    Order Specific Question:   Indication:    Answer:   Pneumonia   DISCONTD: vancomycin (VANCOCIN) IVPB 1000 mg/200 mL premix    Order Specific Question:   Indication:    Answer:   Pneumonia   acidophilus (RISAQUAD) capsule 2 capsule   DISCONTD: ceFEPIme (MAXIPIME) 1 g in sodium chloride 0.9 % 100 mL IVPB    Order Specific Question:   Antibiotic Indication:    Answer:   HCAP   DISCONTD: doxycycline (VIBRAMYCIN) 100 mg in sodium chloride 0.9 % 250 mL IVPB    Order Specific Question:   Antibiotic Indication:    Answer:   CAP   ceFEPIme (MAXIPIME) 2 g in sodium chloride 0.9 % 100 mL IVPB    Order Specific Question:   Antibiotic Indication:    Answer:   HCAP   doxycycline (VIBRAMYCIN) 100 mg in sodium chloride 0.9 % 250 mL IVPB    Order Specific Question:   Antibiotic Indication:    Answer:   CAP     Admission Imaging : CT Head Wo Contrast Result Date: 06/02/2022 CLINICAL DATA:  Altered mental status, shortness of breath EXAM: CT HEAD WITHOUT CONTRAST TECHNIQUE: Contiguous axial images were obtained from the base of the skull through the vertex without intravenous contrast. RADIATION DOSE REDUCTION: This  exam was performed according to the departmental dose-optimization program which includes automated exposure control, adjustment of the mA and/or kV according to patient size and/or use of iterative reconstruction technique. COMPARISON:  05/24/2022 FINDINGS: Brain: No evidence of acute infarction, hemorrhage, hydrocephalus, extra-axial collection or mass lesion/mass effect. Subcortical white matter and periventricular small vessel ischemic changes. Vascular: Mild intracranial atherosclerosis. Skull: Normal. Negative for fracture or focal lesion. Sinuses/Orbits: The visualized paranasal sinuses are essentially clear. The mastoid air cells are unopacified. Other: None. IMPRESSION: No evidence of acute intracranial abnormality. Small vessel ischemic changes. Electronically Signed    By: Julian Hy M.D.   On: 06/02/2022 22:17   DG Chest 1 View Result Date: 06/02/2022 CLINICAL DATA:  Shortness of breath EXAM: CHEST  1 VIEW COMPARISON:  06/02/2022 at 4:10 p.m. FINDINGS: Interval increase in prominence of bilateral patchy airspace and interstitial opacities compared with radiographs earlier today at 4:10 p.m. This may be due to decreased lung volumes. No pleural effusion or pneumothorax. Stable cardiomediastinal silhouette. Aortic atherosclerotic calcification. No acute osseous abnormality. IMPRESSION: Bilateral airspace opacities compatible with multifocal pneumonia. Electronically Signed   By: Placido Sou M.D.   On: 06/02/2022 20:35   DG Chest 2 View Result Date: 06/02/2022 CLINICAL DATA:  Persistent dyspnea status post pneumonia. EXAM: CHEST - 2 VIEW COMPARISON:  05/31/2022 FINDINGS: Stable cardiomediastinal contours. Large hiatal hernia. No pleural effusion or interstitial edema. Bilateral peripheral and left lung base airspace opacities are identified. Opacity in the left base is unchanged from previous exam. Right upper lobe and left midlung opacities are stable to mildly improved in the interval. IMPRESSION: 1. Persistent bilateral peripheral and left lung base airspace opacities compatible with multifocal pneumonia. 2. Right upper lobe and left midlung opacities are stable to mildly improved in the interval. Electronically Signed   By: Kerby Moors M.D.   On: 06/02/2022 16:34    Physical Examination: Vitals:   06/02/22 2000 06/02/22 2001  BP: (!) 165/75   Pulse: 65   Temp: 98 F (36.7 C)   Resp: 20   Height:  '5\' 7"'$  (1.702 m)  Weight:  74.8 kg  SpO2: 95%   TempSrc: Oral   BMI (Calculated):  25.84   Physical Exam Vitals and nursing note reviewed.  Constitutional:      General: He is not in acute distress.    Appearance: He is not ill-appearing, toxic-appearing or diaphoretic.  HENT:     Head: Normocephalic and atraumatic.     Right Ear: Hearing and  external ear normal.     Left Ear: Hearing and external ear normal.     Nose: Nose normal. No nasal deformity.     Mouth/Throat:     Lips: Pink.     Mouth: Mucous membranes are moist.     Tongue: No lesions.     Pharynx: Oropharynx is clear.  Eyes:     Extraocular Movements: Extraocular movements intact.     Pupils: Pupils are equal, round, and reactive to light.  Cardiovascular:     Rate and Rhythm: Normal rate and regular rhythm.     Pulses: Normal pulses.     Heart sounds: Normal heart sounds.  Pulmonary:     Effort: Pulmonary effort is normal.     Breath sounds: Rhonchi present.  Abdominal:     General: Bowel sounds are normal. There is no distension.     Palpations: Abdomen is soft. There is no mass.     Tenderness: There is no abdominal tenderness. There  is no guarding.     Hernia: No hernia is present.  Musculoskeletal:     Right lower leg: No edema.     Left lower leg: No edema.  Skin:    General: Skin is warm.  Neurological:     General: No focal deficit present.     Mental Status: He is alert and oriented to person, place, and time.     Cranial Nerves: Cranial nerves 2-12 are intact.     Motor: Motor function is intact.  Psychiatric:        Attention and Perception: Attention normal.        Mood and Affect: Mood normal.        Speech: Speech normal.        Behavior: Behavior normal. Behavior is cooperative.        Cognition and Memory: Cognition normal.    Assessment and Plan: * SOB (shortness of breath) Blood gas is pending.  Pt returning for SOB.  Found to have multifocal pneumonia on imaging.  Pt had negative procalcitonin. We will cont with cefepime. Suspect this is ILD or pulmonary HTN 2/2 to it.  We will get pulmonary consult.  AM message sent to dr. Lanney Gins for dr. Raul Del group.   Acute respiratory failure with hypoxia (HCC) SpO2: 95 % Pt started on 2 L [place]. We will follow.    Acute metabolic encephalopathy Resolved. 2/2 to hypoxia.     Multifocal pneumonia Suspect pt needs HRCT and PFT. Will defer to Pulmonology for eval and management.  PRN albuterol.  Cont cefepime and doxycycline.    Deep vein thrombosis (DVT) of proximal vein of right lower extremity (HCC) H/o DVT. Cont heparin for dvt prophylaxis. Pt was on eliquis for his DVT but had sever GIB and was taken off eliquis.    HTN (hypertension) Vitals:   06/02/22 2000  BP: (!) 165/75  PRN hydralazine.      DVT prophylaxis:  Heparin   Code Status:  Full code     Family Communication:  CHARAN, PRIETO (Spouse)  [phone]    Disposition Plan:  Home   Consults called:  Pulmonary Dr.Aleskerov.   Admission status: Inpatient.   Unit/ Expected LOS: Med tele/ 2 days.     Para Skeans MD [facility]  6 PM- 2 AM. Please contact me via secure Chat 6 PM-2 AM. [phone]( Pager ) To contact the [facility] Attending or Consulting provider [place] or covering provider during after hours [place], for this patient.   Check the care team in [facility] and look for a) attending/consulting [facility] provider listed and b) the [facility] team listed Log into [email or web address] and use [place]'s universal password to access. If you do not have the password, please contact the hospital operator. Locate the [facility] provider you are looking for under [facility] and page to a number that you can be directly reached. If you still have difficulty reaching the provider, please page the [facility] (Director on Call) for the Hospitalists listed on amion for assistance. [email or web address] 06/03/2022, 1:51 AM

## 2022-06-03 NOTE — Consult Note (Signed)
Pharmacy Antibiotic Note  Clayton [name] is a 88 y.o. male admitted on 06/02/2022 with pneumonia.  Pharmacy has been consulted for cefepime dosing. Patient also receiving doxycycline 100 mg BID  Plan: Continue doxycycline 100 mg BID Initiate cefepime 2 gram Q12H  Height: '5\' 7"'$  (170.2 cm) Weight: 74.8 kg (165 lb) IBW/kg (Calculated) : 66.1  Temp (24hrs), Avg:98 F (36.7 C), Min:98 F (36.7 C), Max:98 F (36.7 C)  Recent Labs  Lab 05/31/22 0026 06/02/22 2009 06/03/22 0139  WBC 12.1* 10.0 8.5  CREATININE 1.20 1.17  --     Estimated Creatinine Clearance: 43.2 mL/min (by C-G formula based on SCr of 1.17 mg/dL).    Allergies  Allergen Reactions   Aspirin Rash   Sulfa Antibiotics Rash    Antimicrobials this admission: 2/1 doxycycline >>  2/2 cefepime >>   Dose adjustments this admission:   Microbiology results: 02/02 BCx: sent 02/02 Sputum: ordered  02/02 MRSA PCR: sent  Thank you for allowing pharmacy to be a part of this patient's care.  Dorothe Pea, PharmD, BCPS Clinical Pharmacist   06/03/2022 2:07 AM

## 2022-06-03 NOTE — Assessment & Plan Note (Signed)
Resolved. 2/2 to hypoxia.

## 2022-06-03 NOTE — TOC CM/SW Note (Signed)
Patient is active with [facility] for PT and OT. Will continue to follow progress and keep them updated in regards to discharge plan.  Dayton Scrape, [place]

## 2022-06-03 NOTE — ED Notes (Addendum)
This nurse was advised by physical therapy that the pt's O2 dropped to 84%. Provider was notified and wants the pt to be on oxygen at home for a few weeks.   Provider is also d/c abx.

## 2022-06-03 NOTE — Evaluation (Signed)
Occupational Therapy Evaluation Patient Details Name: Clayton [name] MRN: [id number] DOB: [birth year] Today's Date: 06/03/2022   History of Present Illness his is a pleasant 88 year old male with a history of previous GI bleed, chronic hypoxemic respiratory failure, basal cell carcinoma, broken ribs in the past, DVT, history of hiatal hernia, chronic anemia, who came in with signs and symptoms of recurrent lower respiratory tract infection.  Patient had a CT chest performed with findings of multifocal patchy opacities in the lungs bilaterally with subpleural and basilar predominant infiltrates.  There is also diverticular disease noted.  There were no pulmonary emboli noted throughout the study.  Patient is getting echo done today during my interview.  Wife is at bedside patient has already been treated with antibiotics multiple times on outpatient.   Clinical Impression   Upon entering the room, pt supine in bed with wife present in room. Pt is agreeable to OT evaluation. Pt is Ind at baseline with self care and functional mobility. 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 He is very active at baseline. He has had multiple falls in the last several month at home. He does not use AD at baseline. Pt ambulates 150' without use of AD on RA with supervision. He is able to stop and pick up several items from the ground without SOB. However, after performing these tasks pt's O2 saturation drops to 84%. He is able to stop and recover on RA to 93% before ambulating back to room with focus on breathing and slowing gait. Pt appears to be taking short shallow breaths during this time. OT did educated and discuss energy conservation techniques with pt and family. He does not appear to need skilled acute OT intervention at this time. OT to complete order. RN and MD notified of O2 desaturation.    Recommendations for follow up therapy are one component of a multi-disciplinary discharge planning process, led by the  attending physician.  Recommendations may be updated based on patient status, additional functional criteria and insurance authorization.   Follow Up Recommendations  No OT follow up     Assistance Recommended at Discharge Frequent or constant Supervision/Assistance  Patient can return home with the following Assistance with cooking/housework;Direct supervision/assist for financial management;Direct supervision/assist for medications management;Assist for transportation;Help with stairs or ramp for entrance    Functional Status Assessment  Patient has had a recent decline in their functional status and demonstrates the ability to make significant improvements in function in a reasonable and predictable amount of time.  Equipment Recommendations  None recommended by OT       Precautions / Restrictions Precautions Precautions: Fall Restrictions Weight Bearing Restrictions: No      Mobility Bed Mobility Overal bed mobility: Modified Independent             General bed mobility comments: increased time and effort required. no dizziness reported with upright activity    Transfers Overall transfer level: Needs assistance Equipment used: None Transfers: Sit to/from Stand Sit to Stand: Supervision                  Balance Overall balance assessment: History of Falls Sitting-balance support: Feet supported Sitting balance-Leahy Scale: Good     Standing balance support: No upper extremity supported, During functional activity Standing balance-Leahy Scale: Fair                             ADL either performed or assessed with  clinical judgement   ADL                       Lower Body Dressing: Supervision/safety   Toilet Transfer: Supervision/safety;Regular Toilet           Functional mobility during ADLs: Supervision/safety       Vision Patient Visual Report: No change from baseline              Pertinent Vitals/Pain Pain  Assessment Pain Assessment: No/denies pain     Hand Dominance Right   Extremity/Trunk Assessment Upper Extremity Assessment Upper Extremity Assessment: Overall WFL for tasks assessed   Lower Extremity Assessment Lower Extremity Assessment: Overall WFL for tasks assessed       Communication Communication Communication: HOH   Cognition Arousal/Alertness: Awake/alert Behavior During Therapy: Flat affect Overall Cognitive Status: Impaired/Different from baseline Area of Impairment: Memory, Following commands, Safety/judgement, Awareness, Problem solving                     Memory: Decreased short-term memory Following Commands: Follows one step commands with increased time Safety/Judgement: Decreased awareness of safety, Decreased awareness of deficits Awareness: Intellectual Problem Solving: Slow processing, Requires verbal cues, Requires tactile cues General Comments: Pt is pleasant and cooperative but looks to wife for answers at times or she corrects him on his answers.                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)      Prior Functioning/Environment Prior Level of Function : Independent/Modified Independent;History of Falls (last six months)             Mobility Comments: amb with no AD, fall history ADLs Comments: MOD I-I in ADL PTA, pt wife reports worsening cognition over the last week                 OT Goals(Current goals can be found in the care plan section) Acute Rehab OT Goals Patient Stated Goal: return to  [facility] OT Goal Formulation: With patient Time For Goal Achievement: 06/03/22 Potential to Achieve Goals: Good  OT Frequency:         AM-PAC OT "6 Clicks" Daily Activity     Outcome Measure Help from another person eating meals?: None Help from another person taking care of personal grooming?: None Help from another person toileting, which includes using toliet, bedpan, or urinal?: None Help from another person bathing (including washing, rinsing, drying)?: None Help from another person to put on and taking off regular upper body clothing?: None Help from another person to put on and taking off regular lower body clothing?: None 6 Click Score: 24   End of Session Nurse Communication: Mobility status;Other (comment) (O2 desaturation)  Activity Tolerance: Patient tolerated treatment well Patient left: with call bell/phone within reach;in bed;with family/visitor present                   Time: 4008-6761 OT Time Calculation (min): 26 min Charges:  OT General Charges $OT Visit: 1 Visit OT Evaluation $OT Eval Low Complexity:  1 Low OT Treatments $Therapeutic Activity: 8-22 mins  Darleen Crocker, MS, OTR/L , CBIS ascom [phone]  06/03/22, 12:20 PM

## 2022-06-03 NOTE — Assessment & Plan Note (Signed)
Vitals:   06/02/22 2000  BP: (!) 165/75  PRN hydralazine.

## 2022-06-03 NOTE — Consult Note (Addendum)
PULMONOLOGY         Date: 06/03/2022,   MRN# [id number] Clayton [name] 13-Jan-1935     AdmissionWeight: 74.8 kg                 CurrentWeight: 74.8 kg  Referring provider: Dr Fritzi Mandes   CHIEF COMPLAINT:   Multifocal pneumonia   HISTORY OF PRESENT ILLNESS   This is a pleasant 88 year old male with a history of previous GI bleed, chronic hypoxemic respiratory failure, basal cell carcinoma, broken ribs in the past, DVT, history of hiatal hernia, chronic anemia, who came in with signs and symptoms of recurrent lower respiratory tract infection.  Patient had a CT chest performed with findings of multifocal patchy opacities in the lungs bilaterally with subpleural and basilar predominant infiltrates.  There is also diverticular disease noted.  There were no pulmonary emboli noted throughout the study.  Patient is getting echo done today during my interview.  Wife is at bedside patient has already been treated with antibiotics multiple times on outpatient.  He has absence of leukocytosis, he is CMP is essentially unremarkable.  He had a very mild elevation of his cardiac biomarkers and he is normoxic on room air without  tachycardia.   PAST MEDICAL HISTORY   Past Medical History:  Diagnosis Date   Acute gastrointestinal hemorrhage    Acute hypoxemic respiratory failure ([place]) 05/25/2022   Basal cell carcinoma    Blood in stool 08/30/2018   Blood transfusion without reported diagnosis 59 (age 55)   After traumatic brain injury   Broken ribs    2 BROKEN ON RIBS   CKD (chronic kidney disease)    PT DENIES   DVT (deep venous thrombosis) ([facility])    RIGHT LEG   Fracture dislocation of wrist    RIGHT   GI bleed 09/02/2018   Hiatal hernia with GERD    HTN (hypertension)    Hypokalemia 05/26/2022   Hyponatremia 05/25/2022   Symptomatic anemia 09/03/2018     SURGICAL HISTORY   Past Surgical History:  Procedure Laterality Date   COLONOSCOPY  2014   cleared for 5  yrs   COLONOSCOPY N/A 09/01/2018   Procedure: COLONOSCOPY;  Surgeon: Virgel Manifold, MD;  Location: [facility] ENDOSCOPY;  Service: Endoscopy;  Laterality: N/A;   CRANIECTOMY FOR DEPRESSED SKULL FRACTURE  1960'S   ESOPHAGOGASTRODUODENOSCOPY N/A 09/01/2018   Procedure: ESOPHAGOGASTRODUODENOSCOPY (EGD);  Surgeon: Virgel Manifold, MD;  Location: [facility] ENDOSCOPY;  Service: Endoscopy;  Laterality: N/A;   HERNIA REPAIR     X2   IVC FILTER REMOVAL N/A 07/03/2017   Procedure: IVC FILTER REMOVAL;  Surgeon: Algernon Huxley, MD;  Location: [place] [facility];  Service: Cardiovascular;  Laterality: N/A;   LUMBAR LAMINECTOMY/DECOMPRESSION MICRODISCECTOMY N/A 04/20/2020   Procedure: L2/3, L3/4 LAMINECTOMY;  Surgeon: [name], MD;  Location: [facility] ORS;  Service: Neurosurgery;  Laterality: N/A;  2ND CASE   PERIPHERAL VASCULAR THROMBECTOMY Right 05/22/2017   Procedure: PERIPHERAL VASCULAR THROMBECTOMY;  Surgeon: Algernon Huxley, MD;  Location: [place] [facility];  Service: Cardiovascular;  Laterality: Right;   SPINE SURGERY  December 2021   TONSILLECTOMY       FAMILY HISTORY   Family History  Problem Relation Age of Onset   Heart disease Father      SOCIAL HISTORY   Social History   Tobacco Use   Smoking status: Former    Packs/day: 0.50    Years: 3.50    Total pack  years: 1.75    Types: Cigarettes    Quit date: 01/01/1959    Years since quitting: 63.4   Smokeless tobacco: Never   Tobacco comments:    I smoked from about age 61 to 21.  Vaping Use   Vaping Use: Never used  Substance Use Topics   Alcohol use: Yes    Alcohol/week: 3.0 standard drinks of alcohol    Types: 3 Glasses of wine per week    Comment: once a week   Drug use: No     MEDICATIONS    Home Medication:  Current Outpatient Rx   Order #: [id number] Class: Normal   Order #: [id number] Class: Normal   Order #: [id number] Class: Normal   Order #: [id number] Class: Normal   Order #: [id number] Class: Historical Med    Order #: [id number] Class: Normal    Current Medication:  Current Facility-Administered Medications:    acetaminophen (TYLENOL) tablet 650 mg, 650 mg, Oral, Q6H PRN **OR** acetaminophen (TYLENOL) suppository 650 mg, 650 mg, Rectal, Q6H PRN, Posey Pronto, Gretta Cool, MD   acidophilus (RISAQUAD) capsule 2 capsule, 2 capsule, Oral, TID, Posey Pronto, Ekta V, MD   albuterol (PROVENTIL) (2.5 MG/3ML) 0.083% nebulizer solution 2.5 mg, 2.5 mg, Nebulization, Q2H PRN, Posey Pronto, Gretta Cool, MD   ceFEPIme (MAXIPIME) 2 g in sodium chloride 0.9 % 100 mL IVPB, 2 g, Intravenous, Q12H, Para Skeans, MD, Stopped at 06/03/22 0300   doxycycline (VIBRAMYCIN) 100 mg in sodium chloride 0.9 % 250 mL IVPB, 100 mg, Intravenous, Q12H, Patel, Gretta Cool, MD   guaiFENesin (MUCINEX) 12 hr tablet 600 mg, 600 mg, Oral, BID PRN, Para Skeans, MD   heparin injection 5,000 Units, 5,000 Units, Subcutaneous, Q8H, Para Skeans, MD, 5,000 Units at 06/03/22 5400   hydrALAZINE (APRESOLINE) injection 5 mg, 5 mg, Intravenous, Q4H PRN, Posey Pronto, Gretta Cool, MD   ondansetron (ZOFRAN) tablet 4 mg, 4 mg, Oral, Q6H PRN **OR** ondansetron (ZOFRAN) injection 4 mg, 4 mg, Intravenous, Q6H PRN, Para Skeans, MD  Current Outpatient Medications:    benzonatate (TESSALON) 100 MG capsule, Take 1 capsule (100 mg total) by mouth every 8 (eight) hours., Disp: 21 capsule, Rfl: 0   pantoprazole (PROTONIX) 40 MG tablet, Take 1 tablet (40 mg total) by mouth daily., Disp: 30 tablet, Rfl: 3   potassium chloride (KLOR-CON M) 10 MEQ tablet, Take 1 tablet (10 mEq total) by mouth 2 (two) times daily for 5 days., Disp: 10 tablet, Rfl: 0   promethazine-dextromethorphan (PROMETHAZINE-DM) 6.25-15 MG/5ML syrup, Take 2.5 mLs by mouth at bedtime as needed., Disp: 118 mL, Rfl: 0   acetaminophen (TYLENOL) 500 MG tablet, Take 1,000 mg by mouth every 6 (six) hours as needed for mild pain or moderate pain. , Disp: , Rfl:    albuterol (VENTOLIN HFA) 108 (90 Base) MCG/ACT inhaler, Inhale 2 puffs into the lungs  every 4 (four) hours as needed for wheezing or shortness of breath., Disp: 8 g, Rfl: 0    ALLERGIES   Aspirin and Sulfa antibiotics     REVIEW OF SYSTEMS    Review of Systems:  Gen:  Denies  fever, sweats, chills weigh loss  HEENT: Denies blurred vision, double vision, ear pain, eye pain, hearing loss, nose bleeds, sore throat Cardiac:  No dizziness, chest pain or heaviness, chest tightness,edema Resp:   reports dyspnea chronically  Gi: Denies swallowing difficulty, stomach pain, nausea or vomiting, diarrhea, constipation, bowel incontinence Gu:  Denies bladder incontinence, burning urine Ext:   Denies Joint pain, stiffness or swelling  Skin: Denies  skin rash, easy bruising or bleeding or hives Endoc:  Denies polyuria, polydipsia , polyphagia or weight change Psych:   Denies depression, insomnia or hallucinations   Other:  All other systems negative   VS: BP (!) 147/72   Pulse 76   Temp 98 F (36.7 C) (Oral)   Resp 20   Ht '5\' 7"'$  (1.702 m)   Wt 74.8 kg   SpO2 96%   BMI 25.84 kg/m      PHYSICAL EXAM    GENERAL:NAD, no fevers, chills, no weakness no fatigue HEAD: Normocephalic, atraumatic.  EYES: Pupils equal, round, reactive to light. Extraocular muscles intact. No scleral icterus.  MOUTH: Moist mucosal membrane. Dentition intact. No abscess noted.  EAR, NOSE, THROAT: Clear without exudates. No external lesions.  NECK: Supple. No thyromegaly. No nodules. No JVD.  PULMONARY: decreased breath sounds with mild rhonchi worse at bases bilaterally.  CARDIOVASCULAR: S1 and S2. Regular rate and rhythm. No murmurs, rubs, or gallops. No edema. Pedal pulses 2+ bilaterally.  GASTROINTESTINAL: Soft, nontender, nondistended. No masses. Positive bowel sounds. No hepatosplenomegaly.  MUSCULOSKELETAL: No swelling, clubbing, or edema. Range of motion full in all extremities.  NEUROLOGIC: Cranial nerves II through XII are intact. No gross focal neurological deficits. Sensation  intact. Reflexes intact.  SKIN: No ulceration, lesions, rashes, or cyanosis. Skin warm and dry. Turgor intact.  PSYCHIATRIC: Mood, affect within normal limits. The patient is awake, alert and oriented x 3. Insight, judgment intact.       IMAGING     Narrative & Impression  CLINICAL DATA:  Dyspnea, lower abdominal pain, follow-up pneumonia.   EXAM: CT ANGIOGRAPHY CHEST   CT ABDOMEN AND PELVIS WITH CONTRAST   TECHNIQUE: Multidetector CT imaging of the chest was performed using the standard protocol during bolus administration of intravenous contrast. Multiplanar CT image reconstructions and MIPs were obtained to evaluate the vascular anatomy. Multidetector CT imaging of the abdomen and pelvis was performed using the standard protocol during bolus administration of intravenous contrast.   RADIATION DOSE REDUCTION: This exam was performed according to the departmental dose-optimization program which includes automated exposure control, adjustment of the mA and/or kV according to patient size and/or use of iterative reconstruction technique.   CONTRAST:  142m OMNIPAQUE IOHEXOL 350 MG/ML SOLN   COMPARISON:  CTA chest dated 05/24/2022.   FINDINGS: CTA CHEST FINDINGS   Cardiovascular: Satisfactory opacification of the bilateral pulmonary arteries to the segmental level. No evidence of pulmonary embolism.   Although not tailored for evaluation of the thoracic aorta, there is no evidence thoracic aortic aneurysm or dissection. Mild atherosclerotic calcifications of the aortic arch/root.   Severe coronary atherosclerosis of the LAD.   Mediastinum/Nodes: Small mediastinal lymph nodes, likely reactive.   Visualized thyroid is unremarkable.   Lungs/Pleura: Multifocal patchy opacities in the lungs bilaterally, subpleural/peripheral and basilar predominant, suggesting multifocal pneumonia. These opacities are more coarse/coalescent than on the prior.   No suspicious  pulmonary nodules.   No pleural effusion or pneumothorax.   Musculoskeletal: Moderate degenerative changes of the thoracic spine.   Review of the MIP images confirms the above findings.   CT ABDOMEN and PELVIS FINDINGS   Hepatobiliary: Liver is within normal limits.   Layering small gallstones (series 1/image 124), without associated inflammatory changes. No intrahepatic or extrahepatic duct dilatation.   Pancreas: Within normal limits.   Spleen: Within normal limits.   Adrenals/Urinary Tract: Adrenal glands are within normal limits.   Kidneys are within normal limits.  No  hydronephrosis.   Bladder is within normal limits.   Stomach/Bowel: Stomach is notable for a large hiatal hernia/inverted intrathoracic stomach.   No evidence of bowel obstruction.   Normal appendix (series 4/image 58).   Extensive left colonic diverticulosis. Patulous proximal sigmoid diverticulum with mild surrounding pericolonic inflammatory changes (series 4/image 69), suggesting mild sigmoid diverticulitis.   No drainable fluid collection/abscess. No free air to suggest macroscopic perforation.   Vascular/Lymphatic: No evidence of abdominal aortic aneurysm.   Atherosclerotic calcifications of the abdominal aorta and branch vessels.   No suspicious abdominopelvic lymphadenopathy.   Reproductive: Mild prostatomegaly.   Other: No abdominopelvic ascites.   Musculoskeletal: Mild degenerative changes of the lumbar spine.   Review of the MIP images confirms the above findings.   IMPRESSION: No evidence of pulmonary embolism.   Multifocal pneumonia, as above.   Mild sigmoid diverticulitis. No drainable fluid collection/abscess. No free air to suggest macroscopic perforation.   Additional ancillary findings as above.     Electronically Signed   By: Julian Hy M.D.   On: 05/31/2022 02:44     ASSESSMENT/PLAN   Community-acquired multifocal pneumonia -Currently patient is  normoxic on room air does not appear to be in distress and is status post full scope of therapy and outpatient. -He had respiratory viral panel performed May 25, 2022 which was negative -He had PCR for flu influenza and RSV performed on 31 May 2022 which was negative -Legionella and strep pneumo antigen has been ordered but is not collected yet -Procalcitonin is within reference range -Venous blood gas is reassuring -Will collect Fungitell for possible mold exposure -Patient had GI bleeding few years ago after being on blood thinners for DVT there is some concern for possible aspiration from hiatal hernia or pulmonary hemorrhage -He does have transthoracic echo in process which will also help [place] determine cardiac status, although his cardiac biomarkers including troponin and BNP are not suspiciously abnormal -Blood cultures are negative to date -Patient received empiric antimicrobials with doxycycline and cefepime -MRSA PCR has been negative I agree with stopping vancomycin as you have already done -Respiratory cultures have been ordered are in process -Patient continues to be completely stable on room air with minimal symptoms we should be able to DC home with p.o. doxycycline x 5 more days and follow-up on outpatient  Thank you for allowing me to participate in the care of this patient.   Patient/Family are satisfied with care plan and all questions have been answered.    Provider disclosure: Patient with at least one acute or chronic illness or injury that poses a threat to life or bodily function and is being managed actively during this encounter.  All of the below services have been performed independently by signing provider:  review of prior documentation from internal and or external health records.  Review of previous and current lab results.  Interview and comprehensive assessment during patient visit today. Review of current and previous chest radiographs/CT scans. Discussion  of management and test interpretation with health care team and patient/family.   This document was prepared using Dragon voice recognition software and may include unintentional dictation errors.     Ottie Glazier, M.D.  Division of Pulmonary & Critical Care Medicine

## 2022-06-03 NOTE — Assessment & Plan Note (Addendum)
H/o DVT. Cont heparin for dvt prophylaxis. Pt was on eliquis for his DVT but had sever GIB and was taken off eliquis.

## 2022-06-03 NOTE — Progress Notes (Signed)
*  PRELIMINARY RESULTS* Echocardiogram 2D Echocardiogram has been performed.  Clayton [name] 06/03/2022, 11:15 AM

## 2022-06-03 NOTE — Assessment & Plan Note (Addendum)
Blood gas is pending.  Pt returning for SOB.  Found to have multifocal pneumonia on imaging.  Pt had negative procalcitonin. We will cont with cefepime. Suspect this is ILD or pulmonary HTN 2/2 to it.  We will get pulmonary consult.  AM message sent to dr. Lanney Gins for dr. Raul Del group.

## 2022-06-04 DIAGNOSIS — R0602 Shortness of breath: Secondary | ICD-10-CM | POA: Diagnosis not present

## 2022-06-04 LAB — C-REACTIVE PROTEIN: CRP: 2.6 mg/dL — ABNORMAL HIGH (ref <1.0)

## 2022-06-04 MED ORDER — DOXYCYCLINE HYCLATE 100 MG PO TABS: 100.0000 mg | ORAL_TABLET | Freq: Two times a day (BID) | ORAL | 0 refills | Status: AC

## 2022-06-04 NOTE — Progress Notes (Signed)
PULMONOLOGY         Date: 06/04/2022,   MRN# [id number] Clayton [name] 07/30/1934     AdmissionWeight: 74.8 kg                 CurrentWeight: 69.1 kg  Referring provider: Dr Fritzi Mandes   CHIEF COMPLAINT:   Multifocal pneumonia   HISTORY OF PRESENT ILLNESS   This is a pleasant 88 year old male with a history of previous GI bleed, chronic hypoxemic respiratory failure, basal cell carcinoma, broken ribs in the past, DVT, history of hiatal hernia, chronic anemia, who came in with signs and symptoms of recurrent lower respiratory tract infection.  Patient had a CT chest performed with findings of multifocal patchy opacities in the lungs bilaterally with subpleural and basilar predominant infiltrates.  There is also diverticular disease noted.  There were no pulmonary emboli noted throughout the study.  Patient is getting echo done today during my interview.  Wife is at bedside patient has already been treated with antibiotics multiple times on outpatient.  He has absence of leukocytosis, he is CMP is essentially unremarkable.  He had a very mild elevation of his cardiac biomarkers and he is normoxic on room air without  tachycardia.  06/05/22- patient is improved today.  He is not having any shortness of breath and was able to walk well without destauration.  He had some memory issues and I think he need to be evaluated by neurology for mild cognitive impairment and dementia.   PAST MEDICAL HISTORY   Past Medical History:  Diagnosis Date   Acute gastrointestinal hemorrhage    Acute hypoxemic respiratory failure ([place]) 05/25/2022   Basal cell carcinoma    Blood in stool 08/30/2018   Blood transfusion without reported diagnosis 81 (age 70)   After traumatic brain injury   Broken ribs    2 BROKEN ON RIBS   CKD (chronic kidney disease)    PT DENIES   DVT (deep venous thrombosis) (HCC)    RIGHT LEG   Fracture dislocation of wrist    RIGHT   GI bleed 09/02/2018   Hiatal  hernia with GERD    HTN (hypertension)    Hypokalemia 05/26/2022   Hyponatremia 05/25/2022   Symptomatic anemia 09/03/2018     SURGICAL HISTORY   Past Surgical History:  Procedure Laterality Date   COLONOSCOPY  2014   cleared for 5 yrs   COLONOSCOPY N/A 09/01/2018   Procedure: COLONOSCOPY;  Surgeon: Virgel Manifold, MD;  Location: [facility] ENDOSCOPY;  Service: Endoscopy;  Laterality: N/A;   CRANIECTOMY FOR DEPRESSED SKULL FRACTURE  1960'S   ESOPHAGOGASTRODUODENOSCOPY N/A 09/01/2018   Procedure: ESOPHAGOGASTRODUODENOSCOPY (EGD);  Surgeon: Virgel Manifold, MD;  Location[facility] ENDOSCOPY;  Service: Endoscopy;  Laterality: N/A;   HERNIA REPAIR     X2   IVC FILTER REMOVAL N/A 07/03/2017   Procedure: IVC FILTER REMOVAL;  Surgeon: Algernon Huxley, MD;  Location: [place] CV LAB;  Service: Cardiovascular;  Laterality: N/A;   LUMBAR LAMINECTOMY/DECOMPRESSION MICRODISCECTOMY N/A 04/20/2020   Procedure: L2/3, L3/4 LAMINECTOMY;  Surgeon: [name], MD;  Location: [facility] ORS;  Service: Neurosurgery;  Laterality: N/A;  2ND CASE   PERIPHERAL VASCULAR THROMBECTOMY Right 05/22/2017   Procedure: PERIPHERAL VASCULAR THROMBECTOMY;  Surgeon: Algernon Huxley, MD;  Location: [place] CV LAB;  Service: Cardiovascular;  Laterality: Right;   SPINE SURGERY  December 2021   TONSILLECTOMY       FAMILY HISTORY   Family History  Problem Relation Age of Onset   Heart disease Father      SOCIAL HISTORY   Social History   Tobacco Use   Smoking status: Former    Packs/day: 0.50    Years: 3.50    Total pack years: 1.75    Types: Cigarettes    Quit date: 01/01/1959    Years since quitting: 63.4   Smokeless tobacco: Never   Tobacco comments:    I smoked from about age 41 to 53.  Vaping Use   Vaping Use: Never used  Substance Use Topics   Alcohol use: Yes    Alcohol/week: 3.0 standard drinks of alcohol    Types: 3 Glasses of wine per week    Comment: once a week   Drug use: No      MEDICATIONS    Home Medication:     Current Medication:  Current Facility-Administered Medications:    acetaminophen (TYLENOL) tablet 650 mg, 650 mg, Oral, Q6H PRN **OR** acetaminophen (TYLENOL) suppository 650 mg, 650 mg, Rectal, Q6H PRN, Para Skeans, MD   acidophilus (RISAQUAD) capsule 2 capsule, 2 capsule, Oral, TID, Para Skeans, MD, 2 capsule at 06/03/22 2220   albuterol (PROVENTIL) (2.5 MG/3ML) 0.083% nebulizer solution 2.5 mg, 2.5 mg, Nebulization, Q2H PRN, Para Skeans, MD   doxycycline (VIBRA-TABS) tablet 100 mg, 100 mg, Oral, Q12H, Fritzi Mandes, MD, 100 mg at 06/03/22 2220   guaiFENesin (MUCINEX) 12 hr tablet 600 mg, 600 mg, Oral, BID PRN, Para Skeans, MD   heparin injection 5,000 Units, 5,000 Units, Subcutaneous, Q8H, Para Skeans, MD, 5,000 Units at 06/04/22 7619   hydrALAZINE (APRESOLINE) injection 5 mg, 5 mg, Intravenous, Q4H PRN, Para Skeans, MD   ondansetron (ZOFRAN) tablet 4 mg, 4 mg, Oral, Q6H PRN **OR** ondansetron (ZOFRAN) injection 4 mg, 4 mg, Intravenous, Q6H PRN, Para Skeans, MD    ALLERGIES   Aspirin and Sulfa antibiotics     REVIEW OF SYSTEMS    Review of Systems:  Gen:  Denies  fever, sweats, chills weigh loss  HEENT: Denies blurred vision, double vision, ear pain, eye pain, hearing loss, nose bleeds, sore throat Cardiac:  No dizziness, chest pain or heaviness, chest tightness,edema Resp:   reports dyspnea chronically  Gi: Denies swallowing difficulty, stomach pain, nausea or vomiting, diarrhea, constipation, bowel incontinence Gu:  Denies bladder incontinence, burning urine Ext:   Denies Joint pain, stiffness or swelling Skin: Denies  skin rash, easy bruising or bleeding or hives Endoc:  Denies polyuria, polydipsia , polyphagia or weight change Psych:   Denies depression, insomnia or hallucinations   Other:  All other systems negative   VS: BP (!) 142/68 (BP Location: Right Arm)   Pulse 67   Temp 98.2 F (36.8 C) (Oral)    Resp 18   Ht '5\' 7"'$  (1.702 m)   Wt 69.1 kg   SpO2 96%   BMI 23.86 kg/m      PHYSICAL EXAM    GENERAL:NAD, no fevers, chills, no weakness no fatigue HEAD: Normocephalic, atraumatic.  EYES: Pupils equal, round, reactive to light. Extraocular muscles intact. No scleral icterus.  MOUTH: Moist mucosal membrane. Dentition intact. No abscess noted.  EAR, NOSE, THROAT: Clear without exudates. No external lesions.  NECK: Supple. No thyromegaly. No nodules. No JVD.  PULMONARY: decreased breath sounds with mild rhonchi worse at bases bilaterally.  CARDIOVASCULAR: S1 and S2. Regular rate and rhythm. No murmurs, rubs, or gallops. No edema. Pedal pulses 2+ bilaterally.  GASTROINTESTINAL: Soft, nontender, nondistended. No masses. Positive bowel sounds. No hepatosplenomegaly.  MUSCULOSKELETAL: No swelling, clubbing, or edema. Range of motion full in all extremities.  NEUROLOGIC: Cranial nerves II through XII are intact. No gross focal neurological deficits. Sensation intact. Reflexes intact.  SKIN: No ulceration, lesions, rashes, or cyanosis. Skin warm and dry. Turgor intact.  PSYCHIATRIC: Mood, affect within normal limits. The patient is awake, alert and oriented x 3. Insight, judgment intact.       IMAGING     Narrative & Impression  CLINICAL DATA:  Dyspnea, lower abdominal pain, follow-up pneumonia.   EXAM: CT ANGIOGRAPHY CHEST   CT ABDOMEN AND PELVIS WITH CONTRAST   TECHNIQUE: Multidetector CT imaging of the chest was performed using the standard protocol during bolus administration of intravenous contrast. Multiplanar CT image reconstructions and MIPs were obtained to evaluate the vascular anatomy. Multidetector CT imaging of the abdomen and pelvis was performed using the standard protocol during bolus administration of intravenous contrast.   RADIATION DOSE REDUCTION: This exam was performed according to the departmental dose-optimization program which includes  automated exposure control, adjustment of the mA and/or kV according to patient size and/or use of iterative reconstruction technique.   CONTRAST:  135m OMNIPAQUE IOHEXOL 350 MG/ML SOLN   COMPARISON:  CTA chest dated 05/24/2022.   FINDINGS: CTA CHEST FINDINGS   Cardiovascular: Satisfactory opacification of the bilateral pulmonary arteries to the segmental level. No evidence of pulmonary embolism.   Although not tailored for evaluation of the thoracic aorta, there is no evidence thoracic aortic aneurysm or dissection. Mild atherosclerotic calcifications of the aortic arch/root.   Severe coronary atherosclerosis of the LAD.   Mediastinum/Nodes: Small mediastinal lymph nodes, likely reactive.   Visualized thyroid is unremarkable.   Lungs/Pleura: Multifocal patchy opacities in the lungs bilaterally, subpleural/peripheral and basilar predominant, suggesting multifocal pneumonia. These opacities are more coarse/coalescent than on the prior.   No suspicious pulmonary nodules.   No pleural effusion or pneumothorax.   Musculoskeletal: Moderate degenerative changes of the thoracic spine.   Review of the MIP images confirms the above findings.   CT ABDOMEN and PELVIS FINDINGS   Hepatobiliary: Liver is within normal limits.   Layering small gallstones (series 1/image 124), without associated inflammatory changes. No intrahepatic or extrahepatic duct dilatation.   Pancreas: Within normal limits.   Spleen: Within normal limits.   Adrenals/Urinary Tract: Adrenal glands are within normal limits.   Kidneys are within normal limits.  No hydronephrosis.   Bladder is within normal limits.   Stomach/Bowel: Stomach is notable for a large hiatal hernia/inverted intrathoracic stomach.   No evidence of bowel obstruction.   Normal appendix (series 4/image 58).   Extensive left colonic diverticulosis. Patulous proximal sigmoid diverticulum with mild surrounding pericolonic  inflammatory changes (series 4/image 69), suggesting mild sigmoid diverticulitis.   No drainable fluid collection/abscess. No free air to suggest macroscopic perforation.   Vascular/Lymphatic: No evidence of abdominal aortic aneurysm.   Atherosclerotic calcifications of the abdominal aorta and branch vessels.   No suspicious abdominopelvic lymphadenopathy.   Reproductive: Mild prostatomegaly.   Other: No abdominopelvic ascites.   Musculoskeletal: Mild degenerative changes of the lumbar spine.   Review of the MIP images confirms the above findings.   IMPRESSION: No evidence of pulmonary embolism.   Multifocal pneumonia, as above.   Mild sigmoid diverticulitis. No drainable fluid collection/abscess. No free air to suggest macroscopic perforation.   Additional ancillary findings as above.     Electronically Signed   By: SBertis Ruddy  [place] Pink M.D.   On: 05/31/2022 02:44     ASSESSMENT/PLAN   Community-acquired multifocal pneumonia -Currently patient is normoxic on room air does not appear to be in distress and is status post full scope of therapy and outpatient. -He had respiratory viral panel performed May 25, 2022 which was negative -He had PCR for flu influenza and RSV performed on 31 May 2022 which was negative -Legionella and strep pneumo antigen has been ordered but is not collected yet -Procalcitonin is within reference range -Venous blood gas is reassuring -Will collect Fungitell for possible mold exposure -Patient had GI bleeding few years ago after being on blood thinners for DVT there is some concern for possible aspiration from hiatal hernia or pulmonary hemorrhage -He does have transthoracic echo in process which will also help [place] determine cardiac status, although his cardiac biomarkers including troponin and BNP are not suspiciously abnormal -Blood cultures are negative to date -Patient received empiric antimicrobials with doxycycline and  cefepime -MRSA PCR has been negative I agree with stopping vancomycin as you have already done -Respiratory cultures have been ordered are in process -Patient continues to be completely stable on room air with minimal symptoms we should be able to DC home with p.o. doxycycline x 5 more days and follow-up on outpatient  Thank you for allowing me to participate in the care of this patient.   Patient/Family are satisfied with care plan and all questions have been answered.    Provider disclosure: Patient with at least one acute or chronic illness or injury that poses a threat to life or bodily function and is being managed actively during this encounter.  All of the below services have been performed independently by signing provider:  review of prior documentation from internal and or external health records.  Review of previous and current lab results.  Interview and comprehensive assessment during patient visit today. Review of current and previous chest radiographs/CT scans. Discussion of management and test interpretation with health care team and patient/family.   This document was prepared using Dragon voice recognition software and may include unintentional dictation errors.     Ottie Glazier, M.D.  Division of Pulmonary & Critical Care Medicine

## 2022-06-04 NOTE — Progress Notes (Signed)
IV removed, personal belongings returned, AVS reviewed, Medications discussed, Appointments verified and discussed, Spouse at the bedside ready to transport patient home in private vehicle. Patient reports having no concerns or questions. : END note>>>>

## 2022-06-04 NOTE — Evaluation (Signed)
Physical Therapy Evaluation Patient Details Name: Clayton [name] MRN: [id number] DOB: 02-Feb-1935 Today's Date: 06/04/2022  History of Present Illness  his is a pleasant 88 year old male with a history of previous GI bleed, chronic hypoxemic respiratory failure, basal cell carcinoma, broken ribs in the past, DVT, history of hiatal hernia, chronic anemia, who came in with signs and symptoms of recurrent lower respiratory tract infection.  Patient had a CT chest performed with findings of multifocal patchy opacities in the lungs bilaterally with subpleural and basilar predominant infiltrates.  There is also diverticular disease noted.  There were no pulmonary emboli noted throughout the study.   Clinical Impression  Patient received reclining in bed with wife Horris Latino at bedside. Patient agreeable to PT evaluation. Evaluation interrupted 10:33-10:43 by MD visit. Patient was alert and pleasant but disoriented to time (unable to state year) and situation (said he thought he had been volunteering at hospital). He was eager to participate in PT bordering on impulsive. He was unable to provide accurate history and took several minutes to recall dog's names. He appeared unaware of his balance/mobility deficits but did know he was not remembering other things. He was fluent with stories from long term memory. Wife confirmed history that was recorded in chart from OT evaluation. Patient lives with his wife in a single story home with 6-8 steps to enter with left handrail. He uses a walk in shower with a built in shower seat. Prior to hospitalization he was I with all aspects of care and mobility. 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 Since getting diagnosed with pneumonia 2 weeks ago and starting medications for it he has had declining cognition and 4 falls. Upon PT eval, patient needed supervision for bed  mobility and transfers and CGA - supervision for ambulation up to 190 feet and up and down stairs with step over step gait pattern and use of L handrail only. His SpO2 ranged from 94% to breif dips to 89% over the course of the session. Session was completed with patient in chair, alarm on, wife at bedside, and needs within reach. Patient appears to have experienced a decline in functional independence including decreased activity tolerance, safety awareness, and increased fall risk. PT recommends HHPT and 24/7 supervision upon discharge to help prevent falls and help him return to prior level of function. Patient would benefit from skilled physical therapy to address impairments and functional limitations (see PT Problem List below) to work towards stated goals and return to PLOF or maximal functional independence.       Recommendations for follow up therapy are one component of a multi-disciplinary discharge planning process, led by the attending physician.  Recommendations may be updated based on patient status, additional functional criteria and insurance authorization.  Follow Up Recommendations Home health PT      Assistance Recommended at Discharge Frequent or constant Supervision/Assistance  Patient can return home with the following  A little help with walking and/or transfers;Help with stairs or ramp for entrance;Assist for transportation;Assistance with cooking/housework    Equipment Recommendations None recommended by PT  Recommendations for Other Services       Functional Status Assessment Patient has had a recent decline in their functional status and demonstrates the ability to make significant improvements in function in a reasonable and predictable amount of time.     Precautions / Restrictions Precautions Precautions: Fall Restrictions Weight Bearing  Restrictions: No      Mobility  Bed Mobility Overal bed mobility: Needs Assistance Bed Mobility: Supine to Sit      Supine to sit: Supervision     General bed mobility comments: no delay or difficulty going supine to sit. Supervision due to deficits in awareness of deficits.    Transfers Overall transfer level: Needs assistance Equipment used: None, Rolling walker (2 wheels) Transfers: Sit to/from Stand Sit to Stand: Supervision           General transfer comment: Pateint transfered sit to stand with RW with correct positioning without cuing for safey. Transferred stand to sit with no AD. Supervision for safety due to awareness deficits.    Ambulation/Gait Ambulation/Gait assistance: Min guard Gait Distance (Feet): 190 Feet Assistive device: None, Rolling walker (2 wheels) Gait Pattern/deviations: WFL(Within Functional Limits)       General Gait Details: Patient ambulated bed to doorway with RW with CGA with good positioning and speed. Removed the RW at doorway and conintued to ambulate around the unit with forward gait, side stepping each side, backwards walking, high knees, horizontal and vertical head turns with no LOB. Does demonstrate heavy foot falls and did not prefer to walk backwards. CGA for safety. SpO2 did not drop below 90%. HR up to 115 bpm by end of ambulation.  Stairs Stairs: Yes Stairs assistance: Supervision Stair Management: One rail Left, Alternating pattern Number of Stairs: 7 General stair comments: Patient ascended/descended 7 steps using left handrail only with supervision for safety. SpO2 remained 90% or above.  Wheelchair Mobility    Modified Rankin (Stroke Patients Only)       Balance Overall balance assessment: History of Falls Sitting-balance support: Feet supported Sitting balance-Leahy Scale: Normal Sitting balance - Comments: seated balance appears normal   Standing balance support: No upper extremity supported, During functional activity Standing balance-Leahy Scale: Good Standing balance comment: patient able to ambulate sideways, backwards,  forwards with horizontal and vertical head turns, and high stepping without LOB with CGA.                             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    Prior Function Prior Level of Function : Independent/Modified Independent;History of Falls (last six months);Driving;Working/employed             Mobility Comments: amb with no AD, fall history (4 times after 2 weeks ago; fell out of the car when he opened the door, 2x in the house on the carpet, 1 time out of bed while sleeping; wife feels he forgot he was weak which is why wife brought him to hospital) ADLs Comments: Prior to 2 weeks ago, patient was I with all aspects of care and mobility with no AD. 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  Hand Dominance   Dominant Hand: Right    Extremity/Trunk Assessment   Upper Extremity Assessment Upper Extremity Assessment: Overall WFL for tasks assessed    Lower Extremity Assessment Lower Extremity Assessment: Overall WFL for tasks assessed    Cervical / Trunk Assessment Cervical / Trunk Assessment: Normal  Communication   Communication: HOH (just got new hearing aide)  Cognition Arousal/Alertness: Awake/alert Behavior During Therapy: WFL for tasks assessed/performed (eager to participate  bordering on impulsive) Overall Cognitive Status: Impaired/Different from baseline Area of Impairment: Memory, Safety/judgement, Awareness (short term memory)                 Orientation Level: Time, Situation (could not name year, thought he was volunteering at hospital but wife reports he does not volunteer at the hospital)   Memory: Decreased short-term memory, Decreased recall of precautions Following Commands: Follows one step commands with increased time Safety/Judgement: Decreased awareness of safety, Decreased awareness of deficits Awareness: Intellectual Problem Solving: Requires verbal cues General Comments: Pt is pleasant and cooperative but does not remember his falls, the year, and other aspects of history or situation.        General Comments      Exercises Other Exercises Other Exercises: patient educated on role of PT in acute care setting, reccomendations for 24/7 supervision at home.   Assessment/Plan    PT Assessment Patient needs continued PT services  PT Problem List Decreased cognition;Decreased mobility;Cardiopulmonary status limiting activity;Decreased knowledge of use of DME;Decreased knowledge of precautions;Decreased activity tolerance;Decreased safety awareness;Decreased balance       PT Treatment Interventions DME instruction;Gait training;Stair training;Functional mobility training;Therapeutic activities;Therapeutic exercise;Balance training;Neuromuscular re-education;Cognitive remediation;Patient/family education    PT Goals (Current goals can be found in the Care Plan section)  Acute Rehab PT Goals Patient Stated Goal: get better and go home without falls PT Goal Formulation: With patient/family Time For Goal Achievement: 06/18/22 Potential to Achieve Goals: Good    Frequency Min 2X/week     Co-evaluation               AM-PAC PT "6 Clicks" Mobility  Outcome Measure Help needed turning from your back to your side while in a  flat bed without using bedrails?: None Help needed moving from lying on your back to sitting on the side of a flat bed without using bedrails?: None Help needed moving to and from a bed to a chair (including a wheelchair)?: A Little Help needed standing up from a chair using your arms (e.g., wheelchair or bedside chair)?: A Little Help needed to walk in hospital room?: A Little Help needed climbing 3-5 steps with a railing? : A Little 6 Click Score: 20    End of Session Equipment Utilized During Treatment: Gait belt Activity Tolerance: Patient tolerated treatment well Patient left: in chair;with call bell/phone within reach;with family/visitor present;with chair alarm set Nurse Communication: Mobility status PT Visit Diagnosis: History of falling (Z91.81);Repeated falls (R29.6);Difficulty in walking, not elsewhere classified (R26.2)    Time: 1005-1055 PT Time Calculation (min) (ACUTE ONLY): 50 min   Charges:   PT Evaluation $PT Eval Low Complexity: 1 Low PT Treatments $Gait Training: 8-22 mins       Everlean Alstrom. Graylon Good, PT, DPT 06/04/22, 11:13 AM

## 2022-06-04 NOTE — Discharge Summary (Signed)
Physician Discharge Summary   Patient: Clayton [name] MRN: [id number] DOB: 1934-10-21  Admit date:     06/02/2022  Discharge date: 06/04/22  Discharge Physician: Fritzi Mandes   PCP: Juline Patch, MD   Recommendations at discharge:   follow-up Dr. Raul Del in 1 to 2 weeks follow-up neurology Dr. Manuella Ghazi-- for evaluation of cognitive decline  Discharge Diagnoses: Principal Problem:   SOB (shortness of breath) Active Problems:   Acute respiratory failure with hypoxia ([place])   Multifocal pneumonia   Acute metabolic encephalopathy   Deep vein thrombosis (DVT) of proximal vein of right lower extremity (HCC)   HTN (hypertension)   Clayton [name] is a 88 y.o. male with past medical history of hypertension, history of TBI, DVT who presents to the ED complaining of shortness of breath.  Patient was recently admitted to the hospital for multifocal pneumonia, subsequently completed treatment with cefdinir and azithromycin.  Wife states that he was confused at the time of admission but his mental status has seemed to worsen since he has returned home.  He is also having intermittent episodes of difficulty breathing where he will start breathing very fast and report feeling short of breath.    Patient ambulated earlier in the ER with occupational therapist sats drop down to 84% however recovered quickly on room air 94%. No history of COPD.    Community acquired pneumonia-- multifocal -- patient currently 96 to 99% on room air. -- He had transient hypoxia while ambulating in the emergency room with occupational therapist. Patient was breathing through his mouth with shallow breath. Recovered to 94%. -- Given second visit to the ER since discharge in January will monitor for requirement of home oxygen.-- Discussed with Dr.Aleskerov-- recommends doxycycline for five days. -- Follow-up bloodwork ordered by pulmonology as outpatient --pt is afebrile white count is normal. --Echo shows EF  60-65% --will assess for home oxygen need --prn inhalers -respiratory cultures pending. -- PCR for flu, RSV negative --overall much better today. Will resume HHPT/OT. Pt did not qualify for home oxygen   Hiatal hernia with Clayton [name] -- continue PPI   Hypertension -- BP stable. -- Patient not on any meds.   ?Cognitive decline/memory issue/h/o TBI --Ambulatory referral for Neurology to see pt   Discussed discharge plan with patient and wife.   Family communication :wife Consults :Pulmonary CODE STATUS: full DVT Prophylaxis :heparin    Cardiac diet DISCHARGE MEDICATION: Allergies as of 06/04/2022       Reactions   Aspirin Rash   Sulfa Antibiotics Rash        Medication List     STOP taking these medications    potassium chloride 10 MEQ tablet Commonly known as: KLOR-CON M       TAKE these medications    acetaminophen 500 MG tablet Commonly known as: TYLENOL Take 1,000 mg by mouth every 6 (six) hours as needed for mild pain or moderate pain.   albuterol 108 (90 Base) MCG/ACT inhaler Commonly known as: VENTOLIN HFA Inhale 2 puffs into the lungs every 4 (four) hours as needed for wheezing or shortness of breath.   benzonatate 100 MG capsule Commonly known as: TESSALON Take 1 capsule (100 mg total) by mouth every 8 (eight) hours.   doxycycline 100 MG tablet Commonly known as: VIBRA-TABS Take 1 tablet (100 mg total) by mouth every 12 (twelve) hours for 4 days.   pantoprazole 40 MG tablet Commonly known as: PROTONIX Take 1 tablet (40 mg total) by mouth daily.  promethazine-dextromethorphan 6.25-15 MG/5ML syrup Commonly known as: PROMETHAZINE-DM Take 2.5 mLs by mouth at bedtime as needed.        Follow-up Information     Juline Patch, MD. Schedule an appointment as soon as possible for a visit in 1 week(s).   Specialty: Family Medicine Contact information: [address] [place] [address] [phone]         Erby Pian,  MD. Schedule an appointment as soon as possible for a visit in 2 week(s).   Specialty: Specialist Why: Hospital f/u Contact information: [place] [address] [phone]                 Bruce Weights   06/02/22 2001 06/03/22 1441  Weight: 74.8 kg 69.1 kg     Condition at discharge: fair  The results of significant diagnostics from this hospitalization (including imaging, microbiology, ancillary and laboratory) are listed below for reference.   Imaging Studies: ECHOCARDIOGRAM COMPLETE  Result Date: 06/03/2022    ECHOCARDIOGRAM REPORT   Patient Name:   Clayton [name] Date of Exam: 06/03/2022 Medical Rec #:  [id number]           Height:       67.0 in Accession #:    [id number]          Weight:       165.0 lb Date of Birth:  [birth year]           BSA:          1.863 m Patient Age:    35 years            BP:           147/72 mmHg Patient Gender: M                   HR:           70 bpm. Exam Location:  [facility] Procedure: 2D Echo, Cardiac Doppler and Color Doppler Indications:     Shortness of Breath, Pulmonary HTN  History:         Patient has no prior history of Echocardiogram examinations.                  Pulmonary HTN, Signs/Symptoms:Shortness of Breath; Risk                  Factors:Hypertension.  Sonographer:     Wenda Low Referring Phys:  Mina Diagnosing Phys: Ida Rogue MD IMPRESSIONS  1. Left ventricular ejection fraction, by estimation, is 60 to 65%. The left ventricle has normal function. The left ventricle has no regional wall motion abnormalities. Left ventricular diastolic parameters are consistent with Grade I diastolic dysfunction (impaired relaxation).  2. Right ventricular systolic function is normal. The right ventricular size is normal. There is normal pulmonary artery systolic pressure. The estimated right ventricular systolic pressure is 07.3 mmHg.  3. The mitral valve is normal in structure. Mild to moderate mitral valve  regurgitation. No evidence of mitral stenosis.  4. The aortic valve is tricuspid. Aortic valve regurgitation is mild to moderate. No aortic stenosis is present.  5. The inferior vena cava is normal in size with greater than 50% respiratory variability, suggesting right atrial pressure of 3 mmHg. FINDINGS  Left Ventricle: Left ventricular ejection fraction, by estimation, is 60 to 65%. The left ventricle has normal function. The left ventricle has no regional wall motion abnormalities. The left ventricular internal  cavity size was normal in size. There is  no left ventricular hypertrophy. Left ventricular diastolic parameters are consistent with Grade I diastolic dysfunction (impaired relaxation). Right Ventricle: The right ventricular size is normal. No increase in right ventricular wall thickness. Right ventricular systolic function is normal. There is normal pulmonary artery systolic pressure. The tricuspid regurgitant velocity is 2.58 m/s, and  with an assumed right atrial pressure of 5 mmHg, the estimated right ventricular systolic pressure is 31.5 mmHg. Left Atrium: Left atrial size was normal in size. Right Atrium: Right atrial size was normal in size. Pericardium: There is no evidence of pericardial effusion. Mitral Valve: The mitral valve is normal in structure. There is mild calcification of the mitral valve leaflet(s). Mild to moderate mitral valve regurgitation. No evidence of mitral valve stenosis. MV peak gradient, 3.3 mmHg. The mean mitral valve gradient is 2.0 mmHg. Tricuspid Valve: The tricuspid valve is normal in structure. Tricuspid valve regurgitation is mild . No evidence of tricuspid stenosis. Aortic Valve: The aortic valve is tricuspid. Aortic valve regurgitation is mild to moderate. Aortic regurgitation PHT measures 441 msec. No aortic stenosis is present. Aortic valve mean gradient measures 2.0 mmHg. Aortic valve peak gradient measures 4.3 mmHg. Aortic valve area, by VTI measures 2.81 cm.  Pulmonic Valve: The pulmonic valve was normal in structure. Pulmonic valve regurgitation is not visualized. No evidence of pulmonic stenosis. Aorta: The aortic root is normal in size and structure. Venous: The inferior vena cava is normal in size with greater than 50% respiratory variability, suggesting right atrial pressure of 3 mmHg. IAS/Shunts: No atrial level shunt detected by color flow Doppler.  LEFT VENTRICLE PLAX 2D LVIDd:         4.00 cm   Diastology LVIDs:         2.55 cm   LV e' medial:    7.07 cm/s LV PW:         0.90 cm   LV E/e' medial:  11.7 LV IVS:        1.10 cm   LV e' lateral:   10.30 cm/s LVOT diam:     2.00 cm   LV E/e' lateral: 8.0 LV SV:         72 LV SV Index:   39 LVOT Area:     3.14 cm  RIGHT VENTRICLE RV Basal diam:  3.15 cm RV Mid diam:    3.10 cm RV S prime:     14.70 cm/s TAPSE (M-mode): 2.4 cm LEFT ATRIUM             Index        RIGHT ATRIUM           Index LA diam:        3.20 cm 1.72 cm/m   RA Area:     18.60 cm LA Vol (A2C):   45.6 ml 24.47 ml/m  RA Volume:   35.80 ml  19.21 ml/m LA Vol (A4C):   49.3 ml 26.46 ml/m LA Biplane Vol: 46.9 ml 25.17 ml/m  AORTIC VALVE                    PULMONIC VALVE AV Area (Vmax):    2.99 cm     PV Vmax:       1.25 m/s AV Area (Vmean):   2.92 cm     PV Peak grad:  6.2 mmHg AV Area (VTI):     2.81 cm AV Vmax:  104.00 cm/s AV Vmean:          66.600 cm/s AV VTI:            0.257 m AV Peak Grad:      4.3 mmHg AV Mean Grad:      2.0 mmHg LVOT Vmax:         99.00 cm/s LVOT Vmean:        61.900 cm/s LVOT VTI:          0.230 m LVOT/AV VTI ratio: 0.89 AI PHT:            441 msec  AORTA Ao Root diam: 3.50 cm Ao Asc diam:  3.70 cm MITRAL VALVE               TRICUSPID VALVE MV Area (PHT): 3.74 cm    TR Peak grad:   26.6 mmHg MV Area VTI:   2.60 cm    TR Vmax:        258.00 cm/s MV Peak grad:  3.3 mmHg MV Mean grad:  2.0 mmHg    SHUNTS MV Vmax:       0.91 m/s    Systemic VTI:  0.23 m MV Vmean:      60.9 cm/s   Systemic Diam: 2.00 cm MV Decel  Time: 203 msec MV E velocity: 82.80 cm/s MV A velocity: 89.10 cm/s MV E/A ratio:  0.93 Ida Rogue MD Electronically signed by Ida Rogue MD Signature Date/Time: 06/03/2022/3:31:32 PM    Final    CT Head Wo Contrast  Result Date: 06/02/2022 CLINICAL DATA:  Altered mental status, shortness of breath EXAM: CT HEAD WITHOUT CONTRAST TECHNIQUE: Contiguous axial images were obtained from the base of the skull through the vertex without intravenous contrast. RADIATION DOSE REDUCTION: This exam was performed according to the departmental dose-optimization program which includes automated exposure control, adjustment of the mA and/or kV according to patient size and/or use of iterative reconstruction technique. COMPARISON:  05/24/2022 FINDINGS: Brain: No evidence of acute infarction, hemorrhage, hydrocephalus, extra-axial collection or mass lesion/mass effect. Subcortical white matter and periventricular small vessel ischemic changes. Vascular: Mild intracranial atherosclerosis. Skull: Normal. Negative for fracture or focal lesion. Sinuses/Orbits: The visualized paranasal sinuses are essentially clear. The mastoid air cells are unopacified. Other: None. IMPRESSION: No evidence of acute intracranial abnormality. Small vessel ischemic changes. Electronically Signed   By: Julian Hy M.D.   On: 06/02/2022 22:17   DG Chest 1 View  Result Date: 06/02/2022 CLINICAL DATA:  Shortness of breath EXAM: CHEST  1 VIEW COMPARISON:  06/02/2022 at 4:10 p.m. FINDINGS: Interval increase in prominence of bilateral patchy airspace and interstitial opacities compared with radiographs earlier today at 4:10 p.m. This may be due to decreased lung volumes. No pleural effusion or pneumothorax. Stable cardiomediastinal silhouette. Aortic atherosclerotic calcification. No acute osseous abnormality. IMPRESSION: Bilateral airspace opacities compatible with multifocal pneumonia. Electronically Signed   By: Placido Sou M.D.   On:  06/02/2022 20:35   DG Chest 2 View  Result Date: 06/02/2022 CLINICAL DATA:  Persistent dyspnea status post pneumonia. EXAM: CHEST - 2 VIEW COMPARISON:  05/31/2022 FINDINGS: Stable cardiomediastinal contours. Large hiatal hernia. No pleural effusion or interstitial edema. Bilateral peripheral and left lung base airspace opacities are identified. Opacity in the left base is unchanged from previous exam. Right upper lobe and left midlung opacities are stable to mildly improved in the interval. IMPRESSION: 1. Persistent bilateral peripheral and left lung base airspace opacities compatible with multifocal pneumonia. 2. Right  upper lobe and left midlung opacities are stable to mildly improved in the interval. Electronically Signed   By: Kerby Moors M.D.   On: 06/02/2022 16:34   CT Angio Chest PE W and/or Wo Contrast  Result Date: 05/31/2022 CLINICAL DATA:  Dyspnea, lower abdominal pain, follow-up pneumonia. EXAM: CT ANGIOGRAPHY CHEST CT ABDOMEN AND PELVIS WITH CONTRAST TECHNIQUE: Multidetector CT imaging of the chest was performed using the standard protocol during bolus administration of intravenous contrast. Multiplanar CT image reconstructions and MIPs were obtained to evaluate the vascular anatomy. Multidetector CT imaging of the abdomen and pelvis was performed using the standard protocol during bolus administration of intravenous contrast. RADIATION DOSE REDUCTION: This exam was performed according to the departmental dose-optimization program which includes automated exposure control, adjustment of the mA and/or kV according to patient size and/or use of iterative reconstruction technique. CONTRAST:  134m OMNIPAQUE IOHEXOL 350 MG/ML SOLN COMPARISON:  CTA chest dated 05/24/2022. FINDINGS: CTA CHEST FINDINGS Cardiovascular: Satisfactory opacification of the bilateral pulmonary arteries to the segmental level. No evidence of pulmonary embolism. Although not tailored for evaluation of the thoracic aorta,  there is no evidence thoracic aortic aneurysm or dissection. Mild atherosclerotic calcifications of the aortic arch/root. Severe coronary atherosclerosis of the LAD. Mediastinum/Nodes: Small mediastinal lymph nodes, likely reactive. Visualized thyroid is unremarkable. Lungs/Pleura: Multifocal patchy opacities in the lungs bilaterally, subpleural/peripheral and basilar predominant, suggesting multifocal pneumonia. These opacities are more coarse/coalescent than on the prior. No suspicious pulmonary nodules. No pleural effusion or pneumothorax. Musculoskeletal: Moderate degenerative changes of the thoracic spine. Review of the MIP images confirms the above findings. CT ABDOMEN and PELVIS FINDINGS Hepatobiliary: Liver is within normal limits. Layering small gallstones (series 1/image 124), without associated inflammatory changes. No intrahepatic or extrahepatic duct dilatation. Pancreas: Within normal limits. Spleen: Within normal limits. Adrenals/Urinary Tract: Adrenal glands are within normal limits. Kidneys are within normal limits.  No hydronephrosis. Bladder is within normal limits. Stomach/Bowel: Stomach is notable for a large hiatal hernia/inverted intrathoracic stomach. No evidence of bowel obstruction. Normal appendix (series 4/image 58). Extensive left colonic diverticulosis. Patulous proximal sigmoid diverticulum with mild surrounding pericolonic inflammatory changes (series 4/image 69), suggesting mild sigmoid diverticulitis. No drainable fluid collection/abscess. No free air to suggest macroscopic perforation. Vascular/Lymphatic: No evidence of abdominal aortic aneurysm. Atherosclerotic calcifications of the abdominal aorta and branch vessels. No suspicious abdominopelvic lymphadenopathy. Reproductive: Mild prostatomegaly. Other: No abdominopelvic ascites. Musculoskeletal: Mild degenerative changes of the lumbar spine. Review of the MIP images confirms the above findings. IMPRESSION: No evidence of  pulmonary embolism. Multifocal pneumonia, as above. Mild sigmoid diverticulitis. No drainable fluid collection/abscess. No free air to suggest macroscopic perforation. Additional ancillary findings as above. Electronically Signed   By: SJulian HyM.D.   On: 05/31/2022 02:44   CT ABDOMEN PELVIS W CONTRAST  Result Date: 05/31/2022 CLINICAL DATA:  Dyspnea, lower abdominal pain, follow-up pneumonia. EXAM: CT ANGIOGRAPHY CHEST CT ABDOMEN AND PELVIS WITH CONTRAST TECHNIQUE: Multidetector CT imaging of the chest was performed using the standard protocol during bolus administration of intravenous contrast. Multiplanar CT image reconstructions and MIPs were obtained to evaluate the vascular anatomy. Multidetector CT imaging of the abdomen and pelvis was performed using the standard protocol during bolus administration of intravenous contrast. RADIATION DOSE REDUCTION: This exam was performed according to the departmental dose-optimization program which includes automated exposure control, adjustment of the mA and/or kV according to patient size and/or use of iterative reconstruction technique. CONTRAST:  1066mOMNIPAQUE IOHEXOL 350 MG/ML SOLN COMPARISON:  CTA chest  dated 05/24/2022. FINDINGS: CTA CHEST FINDINGS Cardiovascular: Satisfactory opacification of the bilateral pulmonary arteries to the segmental level. No evidence of pulmonary embolism. Although not tailored for evaluation of the thoracic aorta, there is no evidence thoracic aortic aneurysm or dissection. Mild atherosclerotic calcifications of the aortic arch/root. Severe coronary atherosclerosis of the LAD. Mediastinum/Nodes: Small mediastinal lymph nodes, likely reactive. Visualized thyroid is unremarkable. Lungs/Pleura: Multifocal patchy opacities in the lungs bilaterally, subpleural/peripheral and basilar predominant, suggesting multifocal pneumonia. These opacities are more coarse/coalescent than on the prior. No suspicious pulmonary nodules. No  pleural effusion or pneumothorax. Musculoskeletal: Moderate degenerative changes of the thoracic spine. Review of the MIP images confirms the above findings. CT ABDOMEN and PELVIS FINDINGS Hepatobiliary: Liver is within normal limits. Layering small gallstones (series 1/image 124), without associated inflammatory changes. No intrahepatic or extrahepatic duct dilatation. Pancreas: Within normal limits. Spleen: Within normal limits. Adrenals/Urinary Tract: Adrenal glands are within normal limits. Kidneys are within normal limits.  No hydronephrosis. Bladder is within normal limits. Stomach/Bowel: Stomach is notable for a large hiatal hernia/inverted intrathoracic stomach. No evidence of bowel obstruction. Normal appendix (series 4/image 58). Extensive left colonic diverticulosis. Patulous proximal sigmoid diverticulum with mild surrounding pericolonic inflammatory changes (series 4/image 69), suggesting mild sigmoid diverticulitis. No drainable fluid collection/abscess. No free air to suggest macroscopic perforation. Vascular/Lymphatic: No evidence of abdominal aortic aneurysm. Atherosclerotic calcifications of the abdominal aorta and branch vessels. No suspicious abdominopelvic lymphadenopathy. Reproductive: Mild prostatomegaly. Other: No abdominopelvic ascites. Musculoskeletal: Mild degenerative changes of the lumbar spine. Review of the MIP images confirms the above findings. IMPRESSION: No evidence of pulmonary embolism. Multifocal pneumonia, as above. Mild sigmoid diverticulitis. No drainable fluid collection/abscess. No free air to suggest macroscopic perforation. Additional ancillary findings as above. Electronically Signed   By: Julian Hy M.D.   On: 05/31/2022 02:44   DG Chest Portable 1 View  Result Date: 05/31/2022 CLINICAL DATA:  Dyspnea EXAM: PORTABLE CHEST 1 VIEW COMPARISON:  05/24/2022 FINDINGS: Cardiac shadow is stable. Patchy airspace opacities are again identified bilaterally stable from  the prior CT examination. Hiatal hernia is noted. No new focal infiltrate is seen. IMPRESSION: No change from the previous CT. Electronically Signed   By: Inez Catalina M.D.   On: 05/31/2022 00:47   CT HEAD WO CONTRAST  Result Date: 05/24/2022 CLINICAL DATA:  Head trauma, moderate-severe EXAM: CT HEAD WITHOUT CONTRAST TECHNIQUE: Contiguous axial images were obtained from the base of the skull through the vertex without intravenous contrast. RADIATION DOSE REDUCTION: This exam was performed according to the departmental dose-optimization program which includes automated exposure control, adjustment of the mA and/or kV according to patient size and/or use of iterative reconstruction technique. COMPARISON:  01/08/2019 FINDINGS: Brain: There is periventricular white matter decreased attenuation consistent with small vessel ischemic changes. Ventricles, sulci and cisterns are prominent consistent with age related involutional changes. No acute intracranial hemorrhage, mass effect or shift. No hydrocephalus. Vascular: No hyperdense vessel or unexpected calcification. Skull: Normal. Negative for fracture or focal lesion. Sinuses/Orbits: Orbits are unremarkable. Mucoperiosteal thickening consistent with chronic left maxillary sinusitis. IMPRESSION: Atrophy and chronic small vessel ischemic changes. No acute intracranial process identified. Electronically Signed   By: Sammie Bench M.D.   On: 05/24/2022 13:07   CT Angio Chest PE W and/or Wo Contrast  Result Date: 05/24/2022 CLINICAL DATA:  Pulmonary embolism. Cough, nasal congestion and loss of balance. Frequent falls. EXAM: CT ANGIOGRAPHY CHEST WITH CONTRAST TECHNIQUE: Multidetector CT imaging of the chest was performed using the standard protocol during  bolus administration of intravenous contrast. Multiplanar CT image reconstructions and MIPs were obtained to evaluate the vascular anatomy. RADIATION DOSE REDUCTION: This exam was performed according to the  departmental dose-optimization program which includes automated exposure control, adjustment of the mA and/or kV according to patient size and/or use of iterative reconstruction technique. CONTRAST:  25m OMNIPAQUE IOHEXOL 350 MG/ML SOLN COMPARISON:  X-ray 05/24/2022 earlier and older FINDINGS: Cardiovascular: Normal cardiopericardial silhouette. No pericardial effusion. Prominent coronary artery calcifications are seen. The thoracic aorta has a bovine type aortic arch. Mild scattered atherosclerotic partially calcified plaque. No dissection or aneurysm formation. Mild plaque extends along the origin of the great vessels. Significant breathing motion. This limits evaluation of small and peripheral emboli. No segmental or larger pulmonary embolism clearly seen. There is some enlargement of the pulmonary artery particularly on the right side. Please correlate for any evidence of pulmonary artery hypertension. Mediastinum/Nodes: Large hiatal hernia with wall thickening along the thoracic esophagus. The esophagus also slightly patulous. No specific abnormal lymph node enlargement present in the axillary regions. There are some small hilar nodes. Example on the right inferiorly on image 73 of series 7 measures 13 by 10 mm. Left-sided focus on image 62 measures 11 by 8 mm. There are some small mediastinal nodes identified which overall less than a cm in short axis and not pathologic by size criteria. Slightly more numerous than usually seen. Lungs/Pleura: There are multifocal areas of interstitial septal thickening and ground-glass type pulmonary opacities particularly in the lower lobes, dependent right upper lobe, lingula. Please correlate for etiology. This could represent some crazy paving. Recommend follow-up. No pleural effusion or pneumothorax. There is diffuse breathing motion. Upper Abdomen: Right adrenal gland is preserved. The left is not included in the imaging field. Musculoskeletal: Slight curvature of the  spine with some degenerative changes. Diffuse bridging osteophytes and syndesmophytes. Review of the MIP images confirms the above findings. IMPRESSION: 1. No segmental or larger pulmonary embolism clearly seen. Significant breathing motion limits evaluation of small and peripheral emboli. 2. Enlargement of the pulmonary artery particularly on the right side. Please correlate for any evidence of pulmonary artery hypertension. 3. Large hiatal hernia with wall thickening along the thoracic esophagus. Please correlate clinically for esophagitis. 4. Multifocal areas of interstitial septal thickening and ground-glass type pulmonary opacities particularly in the lower lobes, dependent right upper lobe, lingula. Please correlate for etiology. This could represent some crazy paving. Recommend follow-up. Prominent hilar and mediastinal nodes may also be reactive recommend attention on follow-up. Aortic Atherosclerosis (ICD10-I70.0) and Emphysema (ICD10-J43.9). Electronically Signed   By: AJill SideM.D.   On: 05/24/2022 13:05   CT Cervical Spine Wo Contrast  Result Date: 05/24/2022 CLINICAL DATA:  Neck trauma, frequent falls and confusion EXAM: CT CERVICAL SPINE WITHOUT CONTRAST TECHNIQUE: Multidetector CT imaging of the cervical spine was performed without intravenous contrast. Multiplanar CT image reconstructions were also generated. RADIATION DOSE REDUCTION: This exam was performed according to the departmental dose-optimization program which includes automated exposure control, adjustment of the mA and/or kV according to patient size and/or use of iterative reconstruction technique. COMPARISON:  01/08/2019 FINDINGS: Alignment: No listhesis. Skull base and vertebrae: No acute fracture. No primary bone lesion or focal pathologic process. Soft tissues and spinal canal: No prevertebral fluid or swelling. No visible canal hematoma. Disc levels: Mild degenerative changes without high-grade spinal canal stenosis. Upper  chest: For findings in the thorax, please see same day CT chest. Other: None. IMPRESSION: 1. No acute fracture or traumatic malalignment  of the cervical spine. 2. For findings in the thorax, please see same day CT chest. Electronically Signed   By: Merilyn Baba M.D.   On: 05/24/2022 13:03   DG Chest Portable 1 View  Result Date: 05/24/2022 CLINICAL DATA:  Shortness of breath. EXAM: PORTABLE CHEST 1 VIEW COMPARISON:  CXR 01/09/19 FINDINGS: No pleural effusion. No pneumothorax. Unchanged cardiac mediastinal contours with the moderate-sized hiatal hernia. There are patchy bilateral airspace opacities there are new from prior exam. Visualized upper abdomen is notable for gas distended loops of small and large bowel. IMPRESSION: New patchy bilateral airspace opacities, suspicious for multifocal pneumonia. Electronically Signed   By: Marin Roberts M.D.   On: 05/24/2022 11:16   DG Chest 2 View  Result Date: 05/21/2022 CLINICAL DATA:  Shortness of breath. Cough and congestion with fever EXAM: CHEST - 2 VIEW COMPARISON:  01/09/2019 FINDINGS: Diffuse interstitial opacity with infiltrate at the left base and in the right upper lobe. Hiatal hernia with moderate gaseous distension compared to prior. Normal heart size and aortic contours. No acute osseous finding. IMPRESSION: 1. Bilateral pneumonia. 2. Sizable hiatal hernia. Electronically Signed   By: Jorje Guild M.D.   On: 05/21/2022 09:10    Microbiology: Results for orders placed or performed during the hospital encounter of 06/02/22  MRSA Next Gen by PCR, Nasal     Status: None   Collection Time: 06/03/22 12:30 AM   Specimen: Nasal Mucosa; Nasal Swab  Result Value Ref Range Status   MRSA by PCR Next Gen NOT DETECTED NOT DETECTED Final    Comment: (NOTE) The GeneXpert MRSA Assay (FDA approved for NASAL specimens only), is one component of a comprehensive MRSA colonization surveillance program. It is not intended to diagnose MRSA infection nor to  guide or monitor treatment for MRSA infections. Test performance is not FDA approved in patients less than 52 years old. Performed at [facility], [address] [place], [place] [address]   Resp panel by RT-PCR (RSV, Flu A&B, Covid) Anterior Nasal Swab     Status: None   Collection Time: 06/03/22 12:38 AM   Specimen: Anterior Nasal Swab  Result Value Ref Range Status   SARS Coronavirus 2 by RT PCR NEGATIVE NEGATIVE Final    Comment: (NOTE) SARS-CoV-2 target nucleic acids are NOT DETECTED.  The SARS-CoV-2 RNA is generally detectable in upper respiratory specimens during the acute phase of infection. The lowest concentration of SARS-CoV-2 viral copies this assay can detect is 138 copies/mL. A negative result does not preclude SARS-Cov-2 infection and should not be used as the sole basis for treatment or other patient management decisions. A negative result may occur with  improper specimen collection/handling, submission of specimen other than nasopharyngeal swab, presence of viral mutation(s) within the areas targeted by this assay, and inadequate number of viral copies(<138 copies/mL). A negative result must be combined with clinical observations, patient history, and epidemiological information. The expected result is Negative.  Fact Sheet for Patients:  EntrepreneurPulse.com.au  Fact Sheet for Healthcare Providers:  IncredibleEmployment.be  This test is no t yet approved or cleared by the [place] FDA and  has been authorized for detection and/or diagnosis of SARS-CoV-2 by FDA under an Emergency Use Authorization (EUA). This EUA will remain  in effect (meaning this test can be used) for the duration of the COVID-19 declaration under Section 564(b)(1) of the Act, 21 U.S.C.section 360bbb-3(b)(1), unless the authorization is terminated  or revoked sooner.       Influenza  A by PCR NEGATIVE NEGATIVE Final   Influenza B by  PCR NEGATIVE NEGATIVE Final    Comment: (NOTE) The Xpert Xpress SARS-CoV-2/FLU/RSV plus assay is intended as an aid in the diagnosis of influenza from Nasopharyngeal swab specimens and should not be used as a sole basis for treatment. Nasal washings and aspirates are unacceptable for Xpert Xpress SARS-CoV-2/FLU/RSV testing.  Fact Sheet for Patients: EntrepreneurPulse.com.au  Fact Sheet for Healthcare Providers: IncredibleEmployment.be  This test is not yet approved or cleared by the [place] FDA and has been authorized for detection and/or diagnosis of SARS-CoV-2 by FDA under an Emergency Use Authorization (EUA). This EUA will remain in effect (meaning this test can be used) for the duration of the COVID-19 declaration under Section 564(b)(1) of the Act, 21 U.S.C. section 360bbb-3(b)(1), unless the authorization is terminated or revoked.     Resp Syncytial Virus by PCR NEGATIVE NEGATIVE Final    Comment: (NOTE) Fact Sheet for Patients: EntrepreneurPulse.com.au  Fact Sheet for Healthcare Providers: IncredibleEmployment.be  This test is not yet approved or cleared by the [place] FDA and has been authorized for detection and/or diagnosis of SARS-CoV-2 by FDA under an Emergency Use Authorization (EUA). This EUA will remain in effect (meaning this test can be used) for the duration of the COVID-19 declaration under Section 564(b)(1) of the Act, 21 U.S.C. section 360bbb-3(b)(1), unless the authorization is terminated or revoked.  Performed at [facility], [name] [place], [place] [address]   Culture, blood (Routine X 2) w Reflex to ID Panel     Status: None (Preliminary result)   Collection Time: 06/03/22  1:39 AM   Specimen: BLOOD  Result Value Ref Range Status   Specimen Description BLOOD  LEFT FOREARM  Final   Special Requests   Final    BOTTLES DRAWN AEROBIC AND ANAEROBIC  Blood Culture adequate volume   Culture   Final    NO GROWTH 1 DAY Performed at [facility], [address] [place], [place] [address]    Report Status PENDING  Incomplete  Culture, blood (Routine X 2) w Reflex to ID Panel     Status: None (Preliminary result)   Collection Time: 06/03/22  1:39 AM   Specimen: BLOOD  Result Value Ref Range Status   Specimen Description BLOOD  RIGHT FOREARM  Final   Special Requests   Final    BOTTLES DRAWN AEROBIC AND ANAEROBIC Blood Culture adequate volume   Culture   Final    NO GROWTH 1 DAY Performed at [facility], [address] [place],  [address]    Report Status PENDING  Incomplete    Labs: CBC: Recent Labs  Lab 05/31/22 0026 06/02/22 2009 06/03/22 0139  WBC 12.1* 10.0 8.5  NEUTROABS 9.1*  --  5.8  HGB 13.4 13.3 12.3*  HCT 41.7 41.7 38.7*  MCV 96.3 95.6 96.0  PLT 672* 710* 035*   Basic Metabolic Panel: Recent Labs  Lab 05/31/22 0026 06/02/22 2009 06/03/22 0139  NA 135 137 137  K 4.3 4.1 3.9  CL 101 106 106  CO2 24 20* 24  GLUCOSE 106* 103* 98  BUN '15 13 12  '$ CREATININE 1.20 1.17 1.16  CALCIUM 8.5* 8.6* 8.2*   Liver Function Tests: Recent Labs  Lab 05/31/22 0026  AST 37  ALT 54*  ALKPHOS 61  BILITOT 0.7  PROT 8.0  ALBUMIN 3.2*    Discharge time spent: greater than 30 minutes.  Signed: Fritzi Mandes, MD  [facility] 06/04/2022

## 2022-06-04 NOTE — TOC CM/SW Note (Signed)
Patient has orders to DC home today. Notified Cory with [facility]. Requested MD place [facility] resumption orders.  Oleh Genin, [place]

## 2022-06-06 ENCOUNTER — Telehealth: Payer: Self-pay

## 2022-06-06 LAB — ANA COMPREHENSIVE PANEL
Anti JO-1: <0.2 AI (ref 0.0–0.9)
Centromere Ab Screen: <0.2 AI (ref 0.0–0.9)
Chromatin Ab SerPl-aCnc: <0.2 AI (ref 0.0–0.9)
ENA SM Ab Ser-aCnc: <0.2 AI (ref 0.0–0.9)
Ribonucleic Protein: <0.2 AI (ref 0.0–0.9)
SSA (Ro) (ENA) Antibody, IgG: <0.2 AI (ref 0.0–0.9)
SSB (La) (ENA) Antibody, IgG: <0.2 AI (ref 0.0–0.9)
Scleroderma (Scl-70) (ENA) Antibody, IgG: <0.2 AI (ref 0.0–0.9)
ds DNA Ab: 1 IU/mL (ref 0–9)

## 2022-06-06 NOTE — Telephone Encounter (Signed)
Transition Care Management Follow-up Telephone Call Date of discharge and from where: 06/04/2022/ [facility] How have you been since you were released from the hospital? "Been pretty good, trying to get up every hour and walk" Any questions or concerns? No  Items Reviewed: Did the pt receive and understand the discharge instructions provided? Yes  Medications obtained and verified? Yes  Other? No  Any new allergies since your discharge? No  Dietary orders reviewed? Yes Do you have support at home? Yes   Home Care and Equipment/Supplies: PT Functional Questionnaire: (I = Independent and D = Dependent) ADLs: I  Bathing/Dressing- I  Meal Prep- I  Eating- I  Maintaining continence- I  Transferring/Ambulation- I  Managing Meds- Wife helps with meds  Follow up appointments reviewed:  PCP Hospital f/u appt confirmed? Yes  Scheduled to see Dr Ronnald Ramp on 06/07/2022 @ 220. [facility] f/u appt confirmed? no Are transportation arrangements needed? No  If their condition worsens, is the pt aware to call PCP or go to the Emergency Dept.? Yes Was the patient provided with contact information for the PCP's office or ED? Yes Was to pt encouraged to call back with questions or concerns? Yes

## 2022-06-07 ENCOUNTER — Other Ambulatory Visit: Payer: Self-pay

## 2022-06-07 ENCOUNTER — Emergency Department
Admission: EM | Admit: 2022-06-07 | Discharge: 2022-06-07 | Disposition: A | Discharge status: Home / Self Care | Payer: Medicare PPO | Attending: Emergency Medicine | Admitting: Emergency Medicine

## 2022-06-07 ENCOUNTER — Encounter: Payer: Self-pay | Admitting: Radiology

## 2022-06-07 ENCOUNTER — Emergency Department: Payer: Medicare PPO

## 2022-06-07 ENCOUNTER — Inpatient Hospital Stay: Payer: Medicare PPO | Admitting: Family Medicine

## 2022-06-07 DIAGNOSIS — R0602 Shortness of breath: Secondary | ICD-10-CM | POA: Insufficient documentation

## 2022-06-07 DIAGNOSIS — I10 Essential (primary) hypertension: Secondary | ICD-10-CM | POA: Insufficient documentation

## 2022-06-07 DIAGNOSIS — R531 Weakness: Secondary | ICD-10-CM | POA: Diagnosis not present

## 2022-06-07 DIAGNOSIS — K449 Diaphragmatic hernia without obstruction or gangrene: Secondary | ICD-10-CM | POA: Diagnosis not present

## 2022-06-07 DIAGNOSIS — I959 Hypotension, unspecified: Secondary | ICD-10-CM | POA: Diagnosis not present

## 2022-06-07 DIAGNOSIS — R0689 Other abnormalities of breathing: Secondary | ICD-10-CM | POA: Diagnosis not present

## 2022-06-07 DIAGNOSIS — J189 Pneumonia, unspecified organism: Secondary | ICD-10-CM | POA: Diagnosis not present

## 2022-06-07 DIAGNOSIS — R0600 Dyspnea, unspecified: Secondary | ICD-10-CM | POA: Diagnosis not present

## 2022-06-07 LAB — CBC
HCT: 42.7 % (ref 39.0–52.0)
Hemoglobin: 13.4 g/dL (ref 13.0–17.0)
MCH: 30.8 pg (ref 26.0–34.0)
MCHC: 31.4 g/dL (ref 30.0–36.0)
MCV: 98.2 fL (ref 80.0–100.0)
Platelets: 605 10*3/uL — ABNORMAL HIGH (ref 150–400)
RBC: 4.35 MIL/uL (ref 4.22–5.81)
RDW: 13 % (ref 11.5–15.5)
WBC: 8 10*3/uL (ref 4.0–10.5)
nRBC: 0 % (ref 0.0–0.2)

## 2022-06-07 LAB — MRSA NEXT GEN BY PCR, NASAL: MRSA by PCR Next Gen: NOT DETECTED

## 2022-06-07 LAB — BASIC METABOLIC PANEL
Anion gap: 10 (ref 5–15)
BUN: 14 mg/dL (ref 8–23)
CO2: 21 mmol/L — ABNORMAL LOW (ref 22–32)
Calcium: 8.6 mg/dL — ABNORMAL LOW (ref 8.9–10.3)
Chloride: 107 mmol/L (ref 98–111)
Creatinine, Ser: 1.13 mg/dL (ref 0.61–1.24)
GFR, Estimated: >60 mL/min (ref >60)
Glucose, Bld: 117 mg/dL — ABNORMAL HIGH (ref 70–99)
Potassium: 3.7 mmol/L (ref 3.5–5.1)
Sodium: 138 mmol/L (ref 135–145)

## 2022-06-07 LAB — TROPONIN I (HIGH SENSITIVITY)
Troponin I (High Sensitivity): 6 ng/L (ref <18)
Troponin I (High Sensitivity): 7 ng/L (ref <18)

## 2022-06-07 LAB — MISC LABCORP TEST (SEND OUT): Labcorp test code: 832599

## 2022-06-07 LAB — BRAIN NATRIURETIC PEPTIDE: B Natriuretic Peptide: 75.7 pg/mL (ref 0.0–100.0)

## 2022-06-07 LAB — LACTIC ACID, PLASMA: Lactic Acid, Venous: 1.3 mmol/L (ref 0.5–1.9)

## 2022-06-07 MED ORDER — SODIUM CHLORIDE 0.9 % IV SOLN
1.0000 g | Freq: Once | INTRAVENOUS | Status: AC
  Administered 2022-06-07: 1 g via INTRAVENOUS
  Filled 2022-06-07: qty 10

## 2022-06-07 MED ORDER — IOHEXOL 350 MG/ML SOLN
75.0000 mL | Freq: Once | INTRAVENOUS | Status: AC | PRN
  Administered 2022-06-07: 75 mL via INTRAVENOUS

## 2022-06-07 MED ORDER — VANCOMYCIN HCL 1500 MG/300ML IV SOLN
1500.0000 mg | Freq: Once | INTRAVENOUS | Status: AC
  Administered 2022-06-07: 1500 mg via INTRAVENOUS
  Filled 2022-06-07: qty 300

## 2022-06-07 NOTE — ED Notes (Signed)
Pt given urinal.

## 2022-06-07 NOTE — ED Notes (Signed)
Pt returned from ct scan

## 2022-06-07 NOTE — ED Notes (Signed)
Pt to CT

## 2022-06-07 NOTE — Discharge Instructions (Signed)
You were seen in the emergency department for an episode of shortness of breath last night.  You had a CT scan that did not show any signs of a blood clot.  It did not show any worsening signs of pneumonia.  Your lab work was overall normal.  When you walked in the emergency department you did not require any oxygen.  Continue to take your antibiotics.  Call and follow-up with your primary care physician first thing in the morning.  Return to the emergency department if you have recurrent episodes of shortness of breath

## 2022-06-07 NOTE — ED Triage Notes (Signed)
Pt to ed acems from home for pneumonia x1 months. Is on 3rd antibiotic. Shob with exertion this am. 97% spo2 on RA with ems.

## 2022-06-07 NOTE — ED Notes (Signed)
Pt verbalizes understanding of discharge instructions. Opportunity for questioning and answers were provided. Pt discharged from ED to home with wife.    

## 2022-06-07 NOTE — ED Provider Notes (Signed)
[facility] Provider Note    Event Date/Time   First MD Initiated Contact with Patient 06/07/22 1002     (approximate)   History   Shortness of Breath   HPI  Clayton [name] is a 88 y.o. male past medical history significant for hypertension, prior TBI, prior DVT (not on anticoagulation), presents to the emergency department shortness of breath.  Patient had a recent hospitalization for multifocal pneumonia and was discharged with an antibiotic.  States that last night he had difficulty catching of breath and was up all night unable to sleep.  Some mild improvement of her shortness of breath since arriving to the emergency department.  Denies any chest pain or fever.  Feels like he cannot catch his breath.  Believes that he was taking his antibiotics since he left the hospital but states that his wife will be able to tell us whenever she arrives to the hospital.  Denies any hemoptysis.  Denies any nausea or vomiting.  No diarrhea.  No blood in his stool.  Denies falls or trauma.     Physical Exam   Triage Vital Signs: ED Triage Vitals  Enc Vitals Group     BP 06/07/22 1001 (!) 134/92     Pulse Rate 06/07/22 1001 81     Resp 06/07/22 1001 (!) 26     Temp 06/07/22 1001 98.2 F (36.8 C)     Temp src --      SpO2 06/07/22 1001 96 %     Weight 06/07/22 1005 152 lb 1.9 oz (69 kg)     Height 06/07/22 1005 '5\' 7"'$  (1.702 m)     Head Circumference --      Peak Flow --      Pain Score 06/07/22 1005 0     Pain Loc --      Pain Edu? --      Excl. in [place]? --     Most recent vital signs: Vitals:   06/07/22 1234 06/07/22 1400  BP: (!) 159/66 (!) 122/51  Pulse: 77 71  Resp: (!) 21 (!) 25  Temp:  98.3 F (36.8 C)  SpO2: 96% 96%    Physical Exam Constitutional:      Appearance: He is well-developed.  HENT:     Head: Atraumatic.  Eyes:     Conjunctiva/sclera: Conjunctivae normal.  Cardiovascular:     Rate and Rhythm: Regular rhythm.  Pulmonary:      Effort: Tachypnea and accessory muscle usage present.     Breath sounds: Examination of the right-lower field reveals rales. Examination of the left-lower field reveals rales. Rales present. No wheezing.  Musculoskeletal:     Cervical back: Normal range of motion.     Right lower leg: No tenderness. No edema.     Left lower leg: No tenderness. No edema.  Skin:    General: Skin is warm.     Capillary Refill: Capillary refill takes less than 2 seconds.  Neurological:     Mental Status: He is alert. Mental status is at baseline.     IMPRESSION / MDM / ASSESSMENT AND PLAN / ED COURSE  I reviewed the triage vital signs and the nursing notes.  Differential diagnosis including multifocal pneumonia, pulmonary embolism, ACS, heart failure exacerbation  On chart review patient had a recent hospitalization and was discharged on 06/04/2022 following a hospitalization for community-acquired pneumonia.  Patient was discharged on doxycycline for 5 days.  Patient had an echocardiogram that showed  an EF of 60 to 65% at that time.  EKG  I, Nathaniel Man, the attending physician, personally viewed and interpreted this ECG.   Rate: Normal  Rhythm: Normal sinus  Axis: Normal  Intervals: Prolonged PR.  QTc 457.  Signs of LVH.  T waves inverted to the inferior leads.  ST&T Change: None No significant change when compared to prior EKG done yesterday.  No tachycardic or bradycardic dysrhythmias while on cardiac telemetry.  RADIOLOGY I independently reviewed imaging, my interpretation of imaging: Chest x-ray was read as multifocal pneumonia that appeared worsened when compared to previous exam  CTA showed no signs of pulmonary embolism and was read as no worsening of pneumonia  LABS (all labs ordered are listed, but only abnormal results are displayed) Labs interpreted as -   Troponin negative.  No significant electrolyte abnormalities Labs Reviewed  CBC - Abnormal; Notable for the following  components:      Result Value   Platelets 605 (*)    All other components within normal limits  BASIC METABOLIC PANEL - Abnormal; Notable for the following components:   CO2 21 (*)    Glucose, Bld 117 (*)    Calcium 8.6 (*)    All other components within normal limits  MRSA NEXT GEN BY PCR, NASAL  CULTURE, BLOOD (ROUTINE X 2)  CULTURE, BLOOD (ROUTINE X 2)  BRAIN NATRIURETIC PEPTIDE  LACTIC ACID, PLASMA  TROPONIN I (HIGH SENSITIVITY)  TROPONIN I (HIGH SENSITIVITY)    TREATMENT  Patient was initially given antibiotics to cover for hospital-acquired pneumonia following chest x-ray that was read as progressively worsening pneumonia given his ongoing cough and recent hospitalization.  Received a dose of vancomycin and cefepime.  After CT scan without progression of his pneumonia I do not feel that the patient needs treatment for hospital-acquired pneumonia at this time.  MDM    Patient ambulated in the room without any acute hypoxia.  No increased work of breathing.  States that he feels better.  Discussed continuing on his antibiotics and given return precautions for any worsening shortness of breath or worsening symptoms.  Discussed following up closely with his primary care provider.   PROCEDURES:  Critical Care performed: No  Procedures  Patient's presentation is most consistent with acute presentation with potential threat to life or bodily function.   MEDICATIONS ORDERED IN ED: Medications  ceFEPIme (MAXIPIME) 1 g in sodium chloride 0.9 % 100 mL IVPB (0 g Intravenous Stopped 06/07/22 1157)  vancomycin (VANCOREADY) IVPB 1500 mg/300 mL (0 mg Intravenous Stopped 06/07/22 1428)  iohexol (OMNIPAQUE) 350 MG/ML injection 75 mL (75 mLs Intravenous Contrast Given 06/07/22 1159)    FINAL CLINICAL IMPRESSION(S) / ED DIAGNOSES   Final diagnoses:  SOB (shortness of breath)     Rx / DC Orders   ED Discharge Orders     None        Note:  This document was prepared using  Dragon voice recognition software and may include unintentional dictation errors.   Nathaniel Man, MD 06/07/22 1553

## 2022-06-07 NOTE — Consult Note (Signed)
PHARMACY -  BRIEF ANTIBIOTIC NOTE   Pharmacy has received consult(s) for PNA from an ED provider.  The patient's profile has been reviewed for ht/wt/allergies/indication/available labs.    One time order(s) placed for cefepime and vancomycin  Further antibiotics/pharmacy consults should be ordered by admitting physician if indicated.                       Thank you, Clayton [name] 06/07/2022  11:27 AM

## 2022-06-08 ENCOUNTER — Telehealth: Payer: Self-pay | Admitting: Family Medicine

## 2022-06-08 ENCOUNTER — Telehealth: Payer: Self-pay

## 2022-06-08 LAB — CULTURE, BLOOD (ROUTINE X 2)
Culture: NO GROWTH
Culture: NO GROWTH
Special Requests: ADEQUATE
Special Requests: ADEQUATE

## 2022-06-08 NOTE — Telephone Encounter (Signed)
Copied from [place]. Topic: Quick Communication - Home Health Verbal Orders >> Jun 08, 2022  3:00 PM Clayton [name] wrote: Caller/Agency: Clayton [name] @ [place] Number: [phone] Requesting OT/PT/Skilled Nursing/Social Work/Speech Therapy: PT Request from [place]: Per pt's wife, they would like to push PT back until sometime next week. Clayton [name] needs "ok" from Dr. Ronnald Ramp. Please call to provide approval or denial. Pt's wife said he has been too sick and has too many appts.

## 2022-06-08 NOTE — Telephone Encounter (Signed)
Transition Care Management Follow-up Telephone Call Date of discharge and from where: 02/06/2024[facility] How have you been since you were released from the hospital? "Feel pretty good" Any questions or concerns? No  Items Reviewed: Did the pt receive and understand the discharge instructions provided? Yes  Medications obtained and verified? Yes  Other? No  Any new allergies since your discharge? No  Dietary orders reviewed? Yes Do you have support at home? Yes   Home Care and Equipment/Supplies: none Functional Questionnaire: (I = Independent and D = Dependent) ADLs: I  Bathing/Dressing- I  Meal Prep- D  Eating- I  Maintaining continence- I  Transferring/Ambulation- I  Managing Meds- I  Follow up appointments reviewed:  PCP Hospital f/u appt confirmed? Yes  Scheduled to see Jones on 06/10/22 @ 140. [facility] f/u appt confirmed? Yes  Scheduled to see Dr. Lanney Gins on 06/10/2022 unsure of what time but wife has it down Are transportation arrangements needed? No  If their condition worsens, is the pt aware to call PCP or go to the Emergency Dept.? Yes Was the patient provided with contact information for the PCP's office or ED? Yes Was to pt encouraged to call back with questions or concerns? Yes

## 2022-06-09 ENCOUNTER — Telehealth: Payer: Self-pay

## 2022-06-09 NOTE — Telephone Encounter (Signed)
Clayton [name] called and stated pt's wife cancelled his PT appt. Due to "having too many appointments." This has been noted. CB for [phone]

## 2022-06-10 ENCOUNTER — Encounter: Payer: Self-pay | Admitting: Family Medicine

## 2022-06-10 ENCOUNTER — Ambulatory Visit (INDEPENDENT_AMBULATORY_CARE_PROVIDER_SITE_OTHER): Payer: Medicare PPO | Admitting: Family Medicine

## 2022-06-10 VITALS — BP 128/74 | HR 84 | Resp 14 | Ht 67.0 in | Wt 158.0 lb

## 2022-06-10 DIAGNOSIS — R9389 Abnormal findings on diagnostic imaging of other specified body structures: Secondary | ICD-10-CM | POA: Diagnosis not present

## 2022-06-10 DIAGNOSIS — J189 Pneumonia, unspecified organism: Secondary | ICD-10-CM

## 2022-06-10 DIAGNOSIS — R413 Other amnesia: Secondary | ICD-10-CM | POA: Diagnosis not present

## 2022-06-10 DIAGNOSIS — R0602 Shortness of breath: Secondary | ICD-10-CM

## 2022-06-10 NOTE — Progress Notes (Signed)
Date:  06/10/2022   Name:  Clayton [name]   DOB:  05-Jan-1935   MRN:  [id number]   Chief Complaint: Hospitalization Follow-up  Patient is a 88 year old male who presents for a comprehensive physical exam. The patient reports the following problems: dyspnea/improving. Health maintenance has been reviewed up to date.      Lab Results  Component Value Date   NA 138 06/07/2022   K 3.7 06/07/2022   CO2 21 (L) 06/07/2022   GLUCOSE 117 (H) 06/07/2022   BUN 14 06/07/2022   CREATININE 1.13 06/07/2022   CALCIUM 8.6 (L) 06/07/2022   EGFR 49 (L) 11/30/2021   GFRNONAA >60 06/07/2022   Lab Results  Component Value Date   CHOL 189 11/30/2021   HDL 43 11/30/2021   LDLCALC 120 (H) 11/30/2021   TRIG 143 11/30/2021   CHOLHDL 3.5 12/16/2015   No results found for: "TSH" No results found for: "HGBA1C" Lab Results  Component Value Date   WBC 8.0 06/07/2022   HGB 13.4 06/07/2022   HCT 42.7 06/07/2022   MCV 98.2 06/07/2022   PLT 605 (H) 06/07/2022   Lab Results  Component Value Date   ALT 54 (H) 05/31/2022   AST 37 05/31/2022   ALKPHOS 61 05/31/2022   BILITOT 0.7 05/31/2022   No results found for: "25OHVITD2", "25OHVITD3", "VD25OH"   Review of Systems  HENT:  Positive for rhinorrhea. Negative for nosebleeds, postnasal drip, sinus pressure, sneezing and sore throat.   Respiratory:  Positive for cough. Negative for chest tightness, shortness of breath, wheezing and stridor.        "Very slight" cough  Cardiovascular:  Negative for chest pain, palpitations and leg swelling.    Patient Active Problem List   Diagnosis Date Noted   SOB (shortness of breath) 06/02/2022   Acute respiratory failure with hypoxia ([place]) 05/25/2022   Multifocal pneumonia XX123456   Acute metabolic encephalopathy XX123456   Falls 05/24/2022   Acute left-sided low back pain with left-sided sciatica 02/25/2020   Lumbar stenosis with neurogenic claudication 02/25/2020   Diverticulosis of colon  with hemorrhage    Hiatal hernia with GERD    Diverticulosis of small intestine without hemorrhage    HTN (hypertension) 08/30/2018   Deep vein thrombosis (DVT) of proximal vein of right lower extremity ([place]) 06/14/2017    Allergies  Allergen Reactions   Aspirin Rash   Sulfa Antibiotics Rash    Past Surgical History:  Procedure Laterality Date   COLONOSCOPY  2014   cleared for 5 yrs   COLONOSCOPY N/A 09/01/2018   Procedure: COLONOSCOPY;  Surgeon: Virgel Manifold, MD;  Location: [facility] ENDOSCOPY;  Service: Endoscopy;  Laterality: N/A;   CRANIECTOMY FOR DEPRESSED SKULL FRACTURE  1960'S   ESOPHAGOGASTRODUODENOSCOPY N/A 09/01/2018   Procedure: ESOPHAGOGASTRODUODENOSCOPY (EGD);  Surgeon: Virgel Manifold, MD;  Location: [facility] ENDOSCOPY;  Service: Endoscopy;  Laterality: N/A;   HERNIA REPAIR     X2   IVC FILTER REMOVAL N/A 07/03/2017   Procedure: IVC FILTER REMOVAL;  Surgeon: Algernon Huxley, MD;  Location: [place] [facility];  Service: Cardiovascular;  Laterality: N/A;   LUMBAR LAMINECTOMY/DECOMPRESSION MICRODISCECTOMY N/A 04/20/2020   Procedure: L2/3, L3/4 LAMINECTOMY;  Surgeon: [name], MD;  Location: [facility] ORS;  Service: Neurosurgery;  Laterality: N/A;  2ND CASE   PERIPHERAL VASCULAR THROMBECTOMY Right 05/22/2017   Procedure: PERIPHERAL VASCULAR THROMBECTOMY;  Surgeon: Algernon Huxley, MD;  Location: [place] [facility];  Service: Cardiovascular;  Laterality: Right;  SPINE SURGERY  December 2021   TONSILLECTOMY      Social History   Tobacco Use   Smoking status: Former    Packs/day: 0.50    Years: 3.50    Total pack years: 1.75    Types: Cigarettes    Quit date: 01/01/1959    Years since quitting: 63.4   Smokeless tobacco: Never   Tobacco comments:    I smoked from about age 36 to 19.  Vaping Use   Vaping Use: Never used  Substance Use Topics   Alcohol use: Yes    Alcohol/week: 3.0 standard drinks of alcohol    Types: 3 Glasses of wine per week    Comment:  once a week   Drug use: No     Medication list has been reviewed and updated.  Current Meds  Medication Sig   acetaminophen (TYLENOL) 500 MG tablet Take 1,000 mg by mouth every 6 (six) hours as needed for mild pain or moderate pain.    albuterol (VENTOLIN HFA) 108 (90 Base) MCG/ACT inhaler Inhale 2 puffs into the lungs every 4 (four) hours as needed for wheezing or shortness of breath.   pantoprazole (PROTONIX) 40 MG tablet Take 1 tablet (40 mg total) by mouth daily.   predniSONE (DELTASONE) 10 MG tablet Take 10 mg by mouth daily with breakfast.   [DISCONTINUED] benzonatate (TESSALON) 100 MG capsule Take 1 capsule (100 mg total) by mouth every 8 (eight) hours.   [DISCONTINUED] promethazine-dextromethorphan (PROMETHAZINE-DM) 6.25-15 MG/5ML syrup Take 2.5 mLs by mouth at bedtime as needed.       06/10/2022    1:35 PM 06/02/2022    3:10 PM 11/30/2021    8:36 AM 07/02/2021   10:45 AM  GAD 7 : Generalized Anxiety Score  Nervous, Anxious, on Edge 0 0 0 0  Control/stop worrying 0 0 0 0  Worry too much - different things 0 0 0 0  Trouble relaxing 0 0 0 0  Restless 0 0 0 0  Easily annoyed or irritable 0 0 0 0  Afraid - awful might happen 0 0 0 0  Total GAD 7 Score 0 0 0 0  Anxiety Difficulty Not difficult at all Not difficult at all Not difficult at all Not difficult at all       06/10/2022    1:35 PM 06/02/2022    3:10 PM 11/30/2021    8:36 AM  Depression screen PHQ 2/9  Decreased Interest 0 0 0  Down, Depressed, Hopeless 0 0 0  PHQ - 2 Score 0 0 0  Altered sleeping 0 0 1  Tired, decreased energy 0 0 1  Change in appetite 0 0 0  Feeling bad or failure about yourself  0 0 0  Trouble concentrating 0 0 0  Moving slowly or fidgety/restless 0 0 0  Suicidal thoughts 0 0 0  PHQ-9 Score 0 0 2  Difficult doing work/chores Not difficult at all Not difficult at all Not difficult at all    BP Readings from Last 3 Encounters:  06/10/22 128/74  06/07/22 (!) 122/51  06/04/22 (!) 142/68     Physical Exam Vitals and nursing note reviewed.  HENT:     Head: Normocephalic.     Right Ear: External ear normal.     Left Ear: External ear normal.     Nose: Nose normal.     Right Turbinates: Not swollen.     Left Turbinates: Not swollen.     Mouth/Throat:  Mouth: Mucous membranes are moist.  Eyes:     General: No scleral icterus.       Right eye: No discharge.        Left eye: No discharge.     Conjunctiva/sclera: Conjunctivae normal.     Pupils: Pupils are equal, round, and reactive to light.  Neck:     Thyroid: No thyromegaly.     Vascular: No JVD.     Trachea: Trachea normal. No tracheal deviation.     Comments: No stridor Cardiovascular:     Rate and Rhythm: Normal rate and regular rhythm.     Heart sounds: Normal heart sounds, S1 normal and S2 normal. No murmur heard.    No systolic murmur is present.     No diastolic murmur is present.     No friction rub. No gallop. No S3 or S4 sounds.  Pulmonary:     Effort: No respiratory distress.     Breath sounds: Normal breath sounds. No decreased air movement. No decreased breath sounds, wheezing, rhonchi or rales.  Abdominal:     General: Bowel sounds are normal.     Palpations: Abdomen is soft.     Tenderness: There is no abdominal tenderness.  Musculoskeletal:        General: No tenderness. Normal range of motion.     Cervical back: Normal range of motion and neck supple.     Right lower leg: No edema.     Left lower leg: No edema.  Lymphadenopathy:     Cervical: No cervical adenopathy.  Skin:    General: Skin is warm.     Findings: No rash.  Neurological:     Mental Status: He is alert.     Wt Readings from Last 3 Encounters:  06/10/22 158 lb (71.7 kg)  06/07/22 152 lb 1.9 oz (69 kg)  06/03/22 152 lb 5.4 oz (69.1 kg)    BP 128/74   Pulse 84   Ht 5' 7"$  (1.702 m)   Wt 158 lb (71.7 kg)   SpO2 94%   BMI 24.75 kg/m   Assessment and Plan:  Follow-up patient from emergency room visit on  06/07/2022.  Patient was seen by Dr. Raul Del pulmonologist for which he was continued on his albuterol and started on prednisone.  An upcoming visit for pulmonary testing was made as well as follow-up.  Patient also had a referral made to neurology for evaluation of possible dementia. 1. Shortness of breath Onset earlier in the month was hospitalized and has had a slow recovery course.  Today patient states that his breathing is improving and is not quite at baseline.  I do not have pulmonary notes to review at this time however my exam notes that there is improved air movement as well as absence of rales rhonchi wheezes or rub.  I encouraged him to continue with follow-up with pulmonary and albuterol and prednisone as prescribed.  2. Multifocal pneumonia Follow-up multifocal pneumonia and is going to be a slow course of resolution from a radiologic basis.  In the meantime physiologically he does seem to be improving and we will continue with present regimen as noted above.  Otilio Miu, MD

## 2022-06-12 LAB — CULTURE, BLOOD (ROUTINE X 2)
Culture: NO GROWTH
Culture: NO GROWTH
Special Requests: ADEQUATE
Special Requests: ADEQUATE

## 2022-06-13 DIAGNOSIS — G9341 Metabolic encephalopathy: Secondary | ICD-10-CM | POA: Diagnosis not present

## 2022-06-13 DIAGNOSIS — I10 Essential (primary) hypertension: Secondary | ICD-10-CM | POA: Diagnosis not present

## 2022-06-13 DIAGNOSIS — K219 Gastro-esophageal reflux disease without esophagitis: Secondary | ICD-10-CM | POA: Diagnosis not present

## 2022-06-13 DIAGNOSIS — J439 Emphysema, unspecified: Secondary | ICD-10-CM | POA: Diagnosis not present

## 2022-06-13 DIAGNOSIS — J9601 Acute respiratory failure with hypoxia: Secondary | ICD-10-CM | POA: Diagnosis not present

## 2022-06-13 DIAGNOSIS — K449 Diaphragmatic hernia without obstruction or gangrene: Secondary | ICD-10-CM | POA: Diagnosis not present

## 2022-06-13 DIAGNOSIS — I2721 Secondary pulmonary arterial hypertension: Secondary | ICD-10-CM | POA: Diagnosis not present

## 2022-06-13 DIAGNOSIS — J189 Pneumonia, unspecified organism: Secondary | ICD-10-CM | POA: Diagnosis not present

## 2022-06-13 DIAGNOSIS — I700 Atherosclerosis of aorta: Secondary | ICD-10-CM | POA: Diagnosis not present

## 2022-06-14 ENCOUNTER — Telehealth: Payer: Self-pay

## 2022-06-14 NOTE — Telephone Encounter (Signed)
        Patient  visited [facility] on 06/07/2022  for shortness of breath.   Telephone encounter attempt :  1st  A HIPAA compliant voice message was left requesting a return call.  Instructed patient to call back at [phone].   [place] Resource Care Guide   ??[email or web address]  ?? [phone]   Website: [email or web address]  [email or web address]

## 2022-06-15 ENCOUNTER — Telehealth: Payer: Self-pay | Admitting: Family Medicine

## 2022-06-15 ENCOUNTER — Telehealth: Payer: Self-pay

## 2022-06-15 NOTE — Telephone Encounter (Unsigned)
Copied from [place]. Topic: Quick Communication - Home Health Verbal Orders >> Jun 15, 2022  9:14 AM Cyndi Bender wrote: Caller/Agency: Costella Hatcher with [facility] Number: [phone] Requesting OT/PT/Skilled Nursing/Social Work/Speech Therapy: PT  Frequency: 1 week 1,  2 week 1, and 1 week 2

## 2022-06-15 NOTE — Telephone Encounter (Signed)
     Patient  visit on 06/07/2022  at [facility] was for shortness of breath.  Have you been able to follow up with your primary care physician? Yes  The patient was or was not able to obtain any needed medicine or equipment.  Patient stated he refused medication prescribed.  Are there diet recommendations that you are having difficulty following? No  Patient expresses understanding of discharge instructions and education provided has no other needs at this time. Yes   [place] Resource Care Guide   [email or web address]  ?? [phone]   Website: [email or web address]  [email or web address]

## 2022-06-15 NOTE — Telephone Encounter (Signed)
Spoke to [place] gave him verbal orders. He verbalized understanding.  KP

## 2022-06-20 ENCOUNTER — Telehealth: Payer: Self-pay | Admitting: Family Medicine

## 2022-06-20 DIAGNOSIS — J189 Pneumonia, unspecified organism: Secondary | ICD-10-CM | POA: Diagnosis not present

## 2022-06-20 DIAGNOSIS — I700 Atherosclerosis of aorta: Secondary | ICD-10-CM | POA: Diagnosis not present

## 2022-06-20 DIAGNOSIS — J439 Emphysema, unspecified: Secondary | ICD-10-CM | POA: Diagnosis not present

## 2022-06-20 DIAGNOSIS — K449 Diaphragmatic hernia without obstruction or gangrene: Secondary | ICD-10-CM | POA: Diagnosis not present

## 2022-06-20 DIAGNOSIS — J9601 Acute respiratory failure with hypoxia: Secondary | ICD-10-CM | POA: Diagnosis not present

## 2022-06-20 DIAGNOSIS — I10 Essential (primary) hypertension: Secondary | ICD-10-CM | POA: Diagnosis not present

## 2022-06-20 DIAGNOSIS — G9341 Metabolic encephalopathy: Secondary | ICD-10-CM | POA: Diagnosis not present

## 2022-06-20 DIAGNOSIS — K219 Gastro-esophageal reflux disease without esophagitis: Secondary | ICD-10-CM | POA: Diagnosis not present

## 2022-06-20 DIAGNOSIS — I2721 Secondary pulmonary arterial hypertension: Secondary | ICD-10-CM | POA: Diagnosis not present

## 2022-06-20 NOTE — Telephone Encounter (Signed)
Copied from [place] [id number]. Topic: General - Other >> Jun 20, 2022  1:16 PM Ludger Nutting wrote: Clayton [name] with [facility] stated that patient had a missed visit on 06/17/22 because patient wasn't feeling well.

## 2022-06-22 DIAGNOSIS — J189 Pneumonia, unspecified organism: Secondary | ICD-10-CM | POA: Diagnosis not present

## 2022-06-22 DIAGNOSIS — K219 Gastro-esophageal reflux disease without esophagitis: Secondary | ICD-10-CM | POA: Diagnosis not present

## 2022-06-22 DIAGNOSIS — I10 Essential (primary) hypertension: Secondary | ICD-10-CM | POA: Diagnosis not present

## 2022-06-22 DIAGNOSIS — J439 Emphysema, unspecified: Secondary | ICD-10-CM | POA: Diagnosis not present

## 2022-06-22 DIAGNOSIS — G9341 Metabolic encephalopathy: Secondary | ICD-10-CM | POA: Diagnosis not present

## 2022-06-22 DIAGNOSIS — K449 Diaphragmatic hernia without obstruction or gangrene: Secondary | ICD-10-CM | POA: Diagnosis not present

## 2022-06-22 DIAGNOSIS — I700 Atherosclerosis of aorta: Secondary | ICD-10-CM | POA: Diagnosis not present

## 2022-06-22 DIAGNOSIS — J9601 Acute respiratory failure with hypoxia: Secondary | ICD-10-CM | POA: Diagnosis not present

## 2022-06-22 DIAGNOSIS — I2721 Secondary pulmonary arterial hypertension: Secondary | ICD-10-CM | POA: Diagnosis not present

## 2022-06-24 DIAGNOSIS — R410 Disorientation, unspecified: Secondary | ICD-10-CM | POA: Diagnosis not present

## 2022-06-24 DIAGNOSIS — K449 Diaphragmatic hernia without obstruction or gangrene: Secondary | ICD-10-CM | POA: Diagnosis not present

## 2022-06-24 DIAGNOSIS — R9389 Abnormal findings on diagnostic imaging of other specified body structures: Secondary | ICD-10-CM | POA: Diagnosis not present

## 2022-06-24 DIAGNOSIS — R0989 Other specified symptoms and signs involving the circulatory and respiratory systems: Secondary | ICD-10-CM | POA: Diagnosis not present

## 2022-06-24 DIAGNOSIS — R0602 Shortness of breath: Secondary | ICD-10-CM | POA: Diagnosis not present

## 2022-06-28 ENCOUNTER — Other Ambulatory Visit: Payer: Self-pay | Admitting: Student

## 2022-06-28 DIAGNOSIS — G479 Sleep disorder, unspecified: Secondary | ICD-10-CM | POA: Diagnosis not present

## 2022-06-28 DIAGNOSIS — R413 Other amnesia: Secondary | ICD-10-CM

## 2022-06-28 DIAGNOSIS — F39 Unspecified mood [affective] disorder: Secondary | ICD-10-CM | POA: Diagnosis not present

## 2022-06-28 DIAGNOSIS — R2689 Other abnormalities of gait and mobility: Secondary | ICD-10-CM | POA: Diagnosis not present

## 2022-06-29 DIAGNOSIS — I700 Atherosclerosis of aorta: Secondary | ICD-10-CM | POA: Diagnosis not present

## 2022-06-29 DIAGNOSIS — G9341 Metabolic encephalopathy: Secondary | ICD-10-CM | POA: Diagnosis not present

## 2022-06-29 DIAGNOSIS — I083 Combined rheumatic disorders of mitral, aortic and tricuspid valves: Secondary | ICD-10-CM | POA: Diagnosis not present

## 2022-06-29 DIAGNOSIS — I129 Hypertensive chronic kidney disease with stage 1 through stage 4 chronic kidney disease, or unspecified chronic kidney disease: Secondary | ICD-10-CM | POA: Diagnosis not present

## 2022-06-29 DIAGNOSIS — G319 Degenerative disease of nervous system, unspecified: Secondary | ICD-10-CM | POA: Diagnosis not present

## 2022-06-29 DIAGNOSIS — D631 Anemia in chronic kidney disease: Secondary | ICD-10-CM | POA: Diagnosis not present

## 2022-06-29 DIAGNOSIS — J9601 Acute respiratory failure with hypoxia: Secondary | ICD-10-CM | POA: Diagnosis not present

## 2022-06-29 DIAGNOSIS — J439 Emphysema, unspecified: Secondary | ICD-10-CM | POA: Diagnosis not present

## 2022-06-29 DIAGNOSIS — N189 Chronic kidney disease, unspecified: Secondary | ICD-10-CM | POA: Diagnosis not present

## 2022-07-01 ENCOUNTER — Ambulatory Visit: Payer: Medicare PPO | Admitting: Family Medicine

## 2022-07-01 ENCOUNTER — Encounter: Payer: Self-pay | Admitting: Family Medicine

## 2022-07-01 VITALS — BP 128/78 | HR 68 | Ht 67.0 in | Wt 161.0 lb

## 2022-07-01 DIAGNOSIS — R5383 Other fatigue: Secondary | ICD-10-CM | POA: Diagnosis not present

## 2022-07-01 DIAGNOSIS — E538 Deficiency of other specified B group vitamins: Secondary | ICD-10-CM | POA: Diagnosis not present

## 2022-07-01 NOTE — Progress Notes (Signed)
Date:  07/01/2022   Name:  Clayton [name]   DOB:  Sep 11, 1934   MRN:  [id number]   Chief Complaint: discuss B12  Thyroid Problem Presents for follow-up visit. Symptoms include cold intolerance, fatigue and weight loss. Patient reports no anxiety, constipation, depressed mood, diaphoresis, diarrhea, dry skin, hair loss, heat intolerance, hoarse voice, nail problem, palpitations or weight gain. The symptoms have been stable.    Lab Results  Component Value Date   NA 138 06/07/2022   K 3.7 06/07/2022   CO2 21 (L) 06/07/2022   GLUCOSE 117 (H) 06/07/2022   BUN 14 06/07/2022   CREATININE 1.13 06/07/2022   CALCIUM 8.6 (L) 06/07/2022   EGFR 49 (L) 11/30/2021   GFRNONAA >60 06/07/2022   Lab Results  Component Value Date   CHOL 189 11/30/2021   HDL 43 11/30/2021   LDLCALC 120 (H) 11/30/2021   TRIG 143 11/30/2021   CHOLHDL 3.5 12/16/2015   No results found for: "TSH" No results found for: "HGBA1C" Lab Results  Component Value Date   WBC 8.0 06/07/2022   HGB 13.4 06/07/2022   HCT 42.7 06/07/2022   MCV 98.2 06/07/2022   PLT 605 (H) 06/07/2022   Lab Results  Component Value Date   ALT 54 (H) 05/31/2022   AST 37 05/31/2022   ALKPHOS 61 05/31/2022   BILITOT 0.7 05/31/2022   No results found for: "25OHVITD2", "25OHVITD3", "VD25OH"   Review of Systems  Constitutional:  Positive for fatigue, unexpected weight change and weight loss. Negative for diaphoresis and weight gain.  HENT:  Negative for hoarse voice.   Respiratory:  Negative for chest tightness, shortness of breath and wheezing.   Cardiovascular:  Negative for palpitations and leg swelling.  Gastrointestinal:  Negative for constipation and diarrhea.  Endocrine: Positive for cold intolerance. Negative for heat intolerance.  Psychiatric/Behavioral:  The patient is not nervous/anxious.     Patient Active Problem List   Diagnosis Date Noted   SOB (shortness of breath) 06/02/2022   Acute respiratory failure with  hypoxia ([place]) 05/25/2022   Multifocal pneumonia XX123456   Acute metabolic encephalopathy XX123456   Falls 05/24/2022   Acute left-sided low back pain with left-sided sciatica 02/25/2020   Lumbar stenosis with neurogenic claudication 02/25/2020   Diverticulosis of colon with hemorrhage    Hiatal hernia with GERD    Diverticulosis of small intestine without hemorrhage    HTN (hypertension) 08/30/2018   Deep vein thrombosis (DVT) of proximal vein of right lower extremity ([place]) 06/14/2017    Allergies  Allergen Reactions   Aspirin Rash   Sulfa Antibiotics Rash    Past Surgical History:  Procedure Laterality Date   COLONOSCOPY  2014   cleared for 5 yrs   COLONOSCOPY N/A 09/01/2018   Procedure: COLONOSCOPY;  Surgeon: Virgel Manifold, MD;  Location: [facility] ENDOSCOPY;  Service: Endoscopy;  Laterality: N/A;   CRANIECTOMY FOR DEPRESSED SKULL FRACTURE  1960'S   ESOPHAGOGASTRODUODENOSCOPY N/A 09/01/2018   Procedure: ESOPHAGOGASTRODUODENOSCOPY (EGD);  Surgeon: Virgel Manifold, MD;  Location: [facility] ENDOSCOPY;  Service: Endoscopy;  Laterality: N/A;   HERNIA REPAIR     X2   IVC FILTER REMOVAL N/A 07/03/2017   Procedure: IVC FILTER REMOVAL;  Surgeon: Algernon Huxley, MD;  Location: [place] [facility];  Service: Cardiovascular;  Laterality: N/A;   LUMBAR LAMINECTOMY/DECOMPRESSION MICRODISCECTOMY N/A 04/20/2020   Procedure: L2/3, L3/4 LAMINECTOMY;  Surgeon: [name], MD;  Location: [facility] ORS;  Service: Neurosurgery;  Laterality: N/A;  2ND CASE  PERIPHERAL VASCULAR THROMBECTOMY Right 05/22/2017   Procedure: PERIPHERAL VASCULAR THROMBECTOMY;  Surgeon: Algernon Huxley, MD;  Location: [place] [facility];  Service: Cardiovascular;  Laterality: Right;   SPINE SURGERY  December 2021   TONSILLECTOMY      Social History   Tobacco Use   Smoking status: Former    Packs/day: 0.50    Years: 3.50    Total pack years: 1.75    Types: Cigarettes    Quit date: 01/01/1959    Years since  quitting: 63.5   Smokeless tobacco: Never   Tobacco comments:    I smoked from about age 74 to 63.  Vaping Use   Vaping Use: Never used  Substance Use Topics   Alcohol use: Yes    Alcohol/week: 3.0 standard drinks of alcohol    Types: 3 Glasses of wine per week    Comment: once a week   Drug use: No     Medication list has been reviewed and updated.  Current Meds  Medication Sig   acetaminophen (TYLENOL) 500 MG tablet Take 1,000 mg by mouth every 6 (six) hours as needed for mild pain or moderate pain.    albuterol (VENTOLIN HFA) 108 (90 Base) MCG/ACT inhaler Inhale 2 puffs into the lungs every 4 (four) hours as needed for wheezing or shortness of breath.   busPIRone (BUSPAR) 7.5 MG tablet Take by mouth.   gabapentin (NEURONTIN) 100 MG capsule Take gabapentin 100 mg nightly for 1 week, then increase to 200 mg nightly and continue this dose.   pantoprazole (PROTONIX) 40 MG tablet Take 1 tablet (40 mg total) by mouth daily.       07/01/2022   10:34 AM 06/10/2022    1:35 PM 06/02/2022    3:10 PM 11/30/2021    8:36 AM  GAD 7 : Generalized Anxiety Score  Nervous, Anxious, on Edge 0 0 0 0  Control/stop worrying 0 0 0 0  Worry too much - different things 0 0 0 0  Trouble relaxing 0 0 0 0  Restless 0 0 0 0  Easily annoyed or irritable 0 0 0 0  Afraid - awful might happen 0 0 0 0  Total GAD 7 Score 0 0 0 0  Anxiety Difficulty Not difficult at all Not difficult at all Not difficult at all Not difficult at all       07/01/2022   10:34 AM 06/10/2022    1:35 PM 06/02/2022    3:10 PM  Depression screen PHQ 2/9  Decreased Interest 0 0 0  Down, Depressed, Hopeless 0 0 0  PHQ - 2 Score 0 0 0  Altered sleeping 0 0 0  Tired, decreased energy 1 0 0  Change in appetite 0 0 0  Feeling bad or failure about yourself  0 0 0  Trouble concentrating 0 0 0  Moving slowly or fidgety/restless 0 0 0  Suicidal thoughts 0 0 0  PHQ-9 Score 1 0 0  Difficult doing work/chores Not difficult at all Not  difficult at all Not difficult at all    BP Readings from Last 3 Encounters:  07/01/22 128/78  06/10/22 128/74  06/07/22 (!) 122/51    Physical Exam Vitals and nursing note reviewed.  HENT:     Head: Normocephalic.     Right Ear: Tympanic membrane and external ear normal.     Left Ear: Tympanic membrane and external ear normal.     Nose: Nose normal.  Eyes:  General: No scleral icterus.       Right eye: No discharge.        Left eye: No discharge.     Conjunctiva/sclera: Conjunctivae normal.     Pupils: Pupils are equal, round, and reactive to light.  Neck:     Thyroid: No thyromegaly.     Vascular: No JVD.     Trachea: No tracheal deviation.  Cardiovascular:     Rate and Rhythm: Normal rate and regular rhythm.     Heart sounds: Normal heart sounds. No murmur heard.    No friction rub. No gallop.  Pulmonary:     Effort: No respiratory distress.     Breath sounds: Normal breath sounds. No wheezing or rales.  Abdominal:     General: Bowel sounds are normal.     Palpations: Abdomen is soft. There is no hepatomegaly, splenomegaly or mass.     Tenderness: There is no abdominal tenderness. There is no guarding or rebound.  Musculoskeletal:        General: No tenderness. Normal range of motion.     Cervical back: Normal range of motion and neck supple.  Lymphadenopathy:     Cervical: No cervical adenopathy.  Skin:    General: Skin is warm.     Findings: No rash.  Neurological:     Mental Status: He is alert.     Wt Readings from Last 3 Encounters:  07/01/22 161 lb (73 kg)  06/10/22 158 lb (71.7 kg)  06/07/22 152 lb 1.9 oz (69 kg)    BP 128/78   Pulse 68   Ht '5\' 7"'$  (1.702 m)   Wt 161 lb (73 kg)   SpO2 95%   BMI 25.22 kg/m   Assessment and Plan:  1. B12 deficiency New onset.  Chronic.  Patient has had some fatigue in an this was initially evaluated with pulmonary with thyroid and B12 evaluation.  B12 was initially noted to be decreased to 192 3 weeks ago.   Patient started on over-the-counter B12 100 mg and has been taking it over the past 2 weeks and I have instructed him to continue on a daily basis and return in 4 weeks and we will check a B12 to see if this has improved and to rule out any absorption abnormality.  2. Fatigue, unspecified type Patient continues to have some fatigability but this may be due to deconditioning and that he is gone back to work and he became tired after a few hours.  Over the course of the next 3 to 4 weeks we will see how he progresses with his stamina and will start by rechecking his blood counts and B12 level.  Patient is a course invited to return if there is worsening sooner for any reason.   Otilio Miu, MD

## 2022-07-06 DIAGNOSIS — I700 Atherosclerosis of aorta: Secondary | ICD-10-CM | POA: Diagnosis not present

## 2022-07-06 DIAGNOSIS — J439 Emphysema, unspecified: Secondary | ICD-10-CM | POA: Diagnosis not present

## 2022-07-06 DIAGNOSIS — J9601 Acute respiratory failure with hypoxia: Secondary | ICD-10-CM | POA: Diagnosis not present

## 2022-07-06 DIAGNOSIS — D631 Anemia in chronic kidney disease: Secondary | ICD-10-CM | POA: Diagnosis not present

## 2022-07-06 DIAGNOSIS — N189 Chronic kidney disease, unspecified: Secondary | ICD-10-CM | POA: Diagnosis not present

## 2022-07-06 DIAGNOSIS — I083 Combined rheumatic disorders of mitral, aortic and tricuspid valves: Secondary | ICD-10-CM | POA: Diagnosis not present

## 2022-07-06 DIAGNOSIS — G9341 Metabolic encephalopathy: Secondary | ICD-10-CM | POA: Diagnosis not present

## 2022-07-06 DIAGNOSIS — I129 Hypertensive chronic kidney disease with stage 1 through stage 4 chronic kidney disease, or unspecified chronic kidney disease: Secondary | ICD-10-CM | POA: Diagnosis not present

## 2022-07-06 DIAGNOSIS — G319 Degenerative disease of nervous system, unspecified: Secondary | ICD-10-CM | POA: Diagnosis not present

## 2022-07-11 DIAGNOSIS — H903 Sensorineural hearing loss, bilateral: Secondary | ICD-10-CM | POA: Diagnosis not present

## 2022-07-13 ENCOUNTER — Ambulatory Visit
Admission: RE | Admit: 2022-07-13 | Discharge: 2022-07-13 | Disposition: A | Discharge status: Home / Self Care | Payer: Medicare PPO | Source: Ambulatory Visit | Attending: Student | Admitting: Student

## 2022-07-13 DIAGNOSIS — R413 Other amnesia: Secondary | ICD-10-CM | POA: Diagnosis not present

## 2022-07-13 DIAGNOSIS — H903 Sensorineural hearing loss, bilateral: Secondary | ICD-10-CM | POA: Diagnosis not present

## 2022-07-13 DIAGNOSIS — G319 Degenerative disease of nervous system, unspecified: Secondary | ICD-10-CM | POA: Diagnosis not present

## 2022-07-14 DIAGNOSIS — R0602 Shortness of breath: Secondary | ICD-10-CM | POA: Diagnosis not present

## 2022-08-01 ENCOUNTER — Ambulatory Visit: Payer: Medicare PPO | Admitting: Family Medicine

## 2022-08-01 DIAGNOSIS — R200 Anesthesia of skin: Secondary | ICD-10-CM | POA: Diagnosis not present

## 2022-08-01 DIAGNOSIS — R202 Paresthesia of skin: Secondary | ICD-10-CM | POA: Diagnosis not present

## 2022-08-04 ENCOUNTER — Ambulatory Visit: Payer: Medicare PPO | Admitting: Family Medicine

## 2022-08-04 VITALS — BP 128/60 | HR 80 | Ht 67.0 in | Wt 167.0 lb

## 2022-08-04 DIAGNOSIS — K219 Gastro-esophageal reflux disease without esophagitis: Secondary | ICD-10-CM | POA: Diagnosis not present

## 2022-08-04 DIAGNOSIS — E538 Deficiency of other specified B group vitamins: Secondary | ICD-10-CM

## 2022-08-04 DIAGNOSIS — K449 Diaphragmatic hernia without obstruction or gangrene: Secondary | ICD-10-CM | POA: Diagnosis not present

## 2022-08-04 DIAGNOSIS — Z8719 Personal history of other diseases of the digestive system: Secondary | ICD-10-CM | POA: Diagnosis not present

## 2022-08-04 MED ORDER — PANTOPRAZOLE SODIUM 40 MG PO TBEC: 40.0000 mg | DELAYED_RELEASE_TABLET | Freq: Every day | ORAL | 3 refills | Status: DC

## 2022-08-04 MED ORDER — PANTOPRAZOLE SODIUM 40 MG PO TBEC: 40.0000 mg | DELAYED_RELEASE_TABLET | Freq: Every day | ORAL | 1 refills | Status: AC

## 2022-08-04 NOTE — Progress Notes (Signed)
Date:  08/04/2022   Name:  Clayton [name]   DOB:  1934/06/28   MRN:  [id number]   Chief Complaint: Gastroesophageal Reflux and B12 def  Gastroesophageal Reflux He reports no abdominal pain, no chest pain, no choking, no coughing, no dysphagia, no heartburn, no hoarse voice, no nausea, no sore throat or no wheezing. This is a chronic problem. The current episode started more than 1 year ago. The problem has been gradually improving. Pertinent negatives include no melena. He has tried a PPI for the symptoms.  Anemia Presents for follow-up visit. There has been no abdominal pain, bruising/bleeding easily, fever or palpitations. Signs of blood loss that are not present include hematemesis, hematochezia and melena. There are no compliance problems.     Lab Results  Component Value Date   NA 138 06/07/2022   K 3.7 06/07/2022   CO2 21 (L) 06/07/2022   GLUCOSE 117 (H) 06/07/2022   BUN 14 06/07/2022   CREATININE 1.13 06/07/2022   CALCIUM 8.6 (L) 06/07/2022   EGFR 49 (L) 11/30/2021   GFRNONAA >60 06/07/2022   Lab Results  Component Value Date   CHOL 189 11/30/2021   HDL 43 11/30/2021   LDLCALC 120 (H) 11/30/2021   TRIG 143 11/30/2021   CHOLHDL 3.5 12/16/2015   No results found for: "TSH" No results found for: "HGBA1C" Lab Results  Component Value Date   WBC 8.0 06/07/2022   HGB 13.4 06/07/2022   HCT 42.7 06/07/2022   MCV 98.2 06/07/2022   PLT 605 (H) 06/07/2022   Lab Results  Component Value Date   ALT 54 (H) 05/31/2022   AST 37 05/31/2022   ALKPHOS 61 05/31/2022   BILITOT 0.7 05/31/2022   No results found for: "25OHVITD2", "25OHVITD3", "VD25OH"   Review of Systems  Constitutional:  Negative for chills and fever.  HENT:  Negative for hoarse voice and sore throat.   Respiratory:  Negative for cough, choking, shortness of breath and wheezing.   Cardiovascular:  Negative for chest pain, palpitations and leg swelling.  Gastrointestinal:  Negative for abdominal pain,  blood in stool, constipation, diarrhea, dysphagia, heartburn, hematemesis, hematochezia, melena and nausea.  Endocrine: Negative for polydipsia.  Genitourinary:  Negative for dysuria, frequency, hematuria and urgency.  Musculoskeletal:  Negative for back pain, myalgias and neck pain.  Skin:  Negative for rash.  Allergic/Immunologic: Negative for environmental allergies.  Neurological:  Negative for dizziness and headaches.  Hematological:  Does not bruise/bleed easily.  Psychiatric/Behavioral:  Negative for suicidal ideas. The patient is not nervous/anxious.     Patient Active Problem List   Diagnosis Date Noted   SOB (shortness of breath) 06/02/2022   Acute respiratory failure with hypoxia 05/25/2022   Multifocal pneumonia [id number]   Acute metabolic encephalopathy XX123456   Falls 05/24/2022   Acute left-sided low back pain with left-sided sciatica 02/25/2020   Lumbar stenosis with neurogenic claudication 02/25/2020   Diverticulosis of colon with hemorrhage    Hiatal hernia with GERD    Diverticulosis of small intestine without hemorrhage    HTN (hypertension) 08/30/2018   Deep vein thrombosis (DVT) of proximal vein of right lower extremity 06/14/2017    Allergies  Allergen Reactions   Aspirin Rash   Sulfa Antibiotics Rash    Past Surgical History:  Procedure Laterality Date   COLONOSCOPY  2014   cleared for 5 yrs   COLONOSCOPY N/A 09/01/2018   Procedure: COLONOSCOPY;  Surgeon: Virgel Manifold, MD;  Location: [facility] ENDOSCOPY;  Service:  Endoscopy;  Laterality: N/A;   CRANIECTOMY FOR DEPRESSED SKULL FRACTURE  1960'S   ESOPHAGOGASTRODUODENOSCOPY N/A 09/01/2018   Procedure: ESOPHAGOGASTRODUODENOSCOPY (EGD);  Surgeon: Virgel Manifold, MD;  Location: [facility] ENDOSCOPY;  Service: Endoscopy;  Laterality: N/A;   HERNIA REPAIR     X2   IVC FILTER REMOVAL N/A 07/03/2017   Procedure: IVC FILTER REMOVAL;  Surgeon: Algernon Huxley, MD;  Location: [place] [facility];  Service:  Cardiovascular;  Laterality: N/A;   LUMBAR LAMINECTOMY/DECOMPRESSION MICRODISCECTOMY N/A 04/20/2020   Procedure: L2/3, L3/4 LAMINECTOMY;  Surgeon: [name], MD;  Location: [facility] ORS;  Service: Neurosurgery;  Laterality: N/A;  2ND CASE   PERIPHERAL VASCULAR THROMBECTOMY Right 05/22/2017   Procedure: PERIPHERAL VASCULAR THROMBECTOMY;  Surgeon: Algernon Huxley, MD;  Location: [place] [facility];  Service: Cardiovascular;  Laterality: Right;   SPINE SURGERY  December 2021   TONSILLECTOMY      Social History   Tobacco Use   Smoking status: Former    Packs/day: 0.50    Years: 3.50    Additional pack years: 0.00    Total pack years: 1.75    Types: Cigarettes    Quit date: 01/01/1959    Years since quitting: 63.6   Smokeless tobacco: Never   Tobacco comments:    I smoked from about age 18 to 61.  Vaping Use   Vaping Use: Never used  Substance Use Topics   Alcohol use: Yes    Alcohol/week: 3.0 standard drinks of alcohol    Types: 3 Glasses of wine per week    Comment: once a week   Drug use: No     Medication list has been reviewed and updated.  Current Meds  Medication Sig   acetaminophen (TYLENOL) 500 MG tablet Take 1,000 mg by mouth every 6 (six) hours as needed for mild pain or moderate pain.    busPIRone (BUSPAR) 7.5 MG tablet Take by mouth.   Cetirizine HCl (ZYRTEC PO) Take by mouth.   Cyanocobalamin (VITAMIN B-12 PO) Take by mouth.   pantoprazole (PROTONIX) 40 MG tablet Take 1 tablet (40 mg total) by mouth daily.   Pediatric Multivitamins-Fl (MULTI VIT/FL PO) Take by mouth.       08/04/2022    8:38 AM 07/01/2022   10:34 AM 06/10/2022    1:35 PM 06/02/2022    3:10 PM  GAD 7 : Generalized Anxiety Score  Nervous, Anxious, on Edge 0 0 0 0  Control/stop worrying 0 0 0 0  Worry too much - different things 0 0 0 0  Trouble relaxing 0 0 0 0  Restless 0 0 0 0  Easily annoyed or irritable 0 0 0 0  Afraid - awful might happen 0 0 0 0  Total GAD 7 Score 0 0 0 0  Anxiety  Difficulty Not difficult at all Not difficult at all Not difficult at all Not difficult at all       08/04/2022    8:37 AM 07/01/2022   10:34 AM 06/10/2022    1:35 PM  Depression screen PHQ 2/9  Decreased Interest 0 0 0  Down, Depressed, Hopeless 0 0 0  PHQ - 2 Score 0 0 0  Altered sleeping 0 0 0  Tired, decreased energy 1 1 0  Change in appetite 0 0 0  Feeling bad or failure about yourself  0 0 0  Trouble concentrating 0 0 0  Moving slowly or fidgety/restless 0 0 0  Suicidal thoughts 0 0 0  PHQ-9 Score 1 1 0  Difficult doing work/chores Not difficult at all Not difficult at all Not difficult at all    BP Readings from Last 3 Encounters:  08/04/22 128/60  07/01/22 128/78  06/10/22 128/74    Physical Exam Vitals reviewed.  HENT:     Head: Normocephalic.     Right Ear: Tympanic membrane and external ear normal.     Left Ear: Tympanic membrane, ear canal and external ear normal.     Nose: Nose normal.     Mouth/Throat:     Mouth: Mucous membranes are moist.  Eyes:     Pupils: Pupils are equal, round, and reactive to light.  Neck:     Thyroid: No thyromegaly.  Cardiovascular:     Rate and Rhythm: Normal rate and regular rhythm.     Pulses: Normal pulses.     Heart sounds: S1 normal and S2 normal.     No systolic murmur is present.     No diastolic murmur is present.     No gallop. No S3 or S4 sounds.  Pulmonary:     Effort: Pulmonary effort is normal.     Breath sounds: Normal breath sounds. No wheezing, rhonchi or rales.  Chest:  Breasts:    Right: Normal.  Abdominal:     General: Bowel sounds are normal.     Palpations: There is no hepatomegaly or splenomegaly.  Musculoskeletal:     Cervical back: Neck supple.  Lymphadenopathy:     Cervical: No cervical adenopathy.     Upper Body:     Right upper body: No supraclavicular or axillary adenopathy.     Left upper body: No supraclavicular or axillary adenopathy.  Neurological:     Mental Status: He is alert.      Deep Tendon Reflexes:     Reflex Scores:      Patellar reflexes are 2+ on the right side and 2+ on the left side.    Wt Readings from Last 3 Encounters:  08/04/22 167 lb (75.8 kg)  07/01/22 161 lb (73 kg)  06/10/22 158 lb (71.7 kg)    BP 128/60   Pulse 80   Ht 5\' 7"  (1.702 m)   Wt 167 lb (75.8 kg)   SpO2 97%   BMI 26.16 kg/m   Assessment and Plan:  1. B12 deficiency Chronic.  Controlled.  Stable.  Patient currently obtaining B12 over-the-counter and is tolerating well.  We will check current B12 levels to see if sufficient quantities of B12 is being absorbed.  We will also check CBC to see if there is any results from anemia. - Vitamin B12 - CBC w/Diff/Platelet - Cyanocobalamin (VITAMIN B-12 PO); Take by mouth.  2. History of GI bleed Patient with a history of GI bleed will continue on pantoprazole 40 mg once a day.  Patient has related no history of hematemesis or melena. - pantoprazole (PROTONIX) 40 MG tablet; Take 1 tablet (40 mg total) by mouth daily.  Dispense: 30 tablet; Refill: 3  3. Hiatal hernia with GERD Patient does have a history of hiatal hernia with GERD requiring Protonix 40 mg once a day. - pantoprazole (PROTONIX) 40 MG tablet; Take 1 tablet (40 mg total) by mouth daily.  Dispense: 30 tablet; Refill: 3    Otilio Miu, MD

## 2022-08-05 LAB — CBC WITH DIFFERENTIAL/PLATELET
Basophils Absolute: 0.1 10*3/uL (ref 0.0–0.2)
Basos: 2 %
EOS (ABSOLUTE): 0.1 10*3/uL (ref 0.0–0.4)
Eos: 2 %
Hematocrit: 41.2 % (ref 37.5–51.0)
Hemoglobin: 13.9 g/dL (ref 13.0–17.7)
Immature Grans (Abs): 0 10*3/uL (ref 0.0–0.1)
Immature Granulocytes: 0 %
Lymphocytes Absolute: 1.5 10*3/uL (ref 0.7–3.1)
Lymphs: 26 %
MCH: 31.4 pg (ref 26.6–33.0)
MCHC: 33.7 g/dL (ref 31.5–35.7)
MCV: 93 fL (ref 79–97)
Monocytes Absolute: 0.4 10*3/uL (ref 0.1–0.9)
Monocytes: 7 %
Neutrophils Absolute: 3.7 10*3/uL (ref 1.4–7.0)
Neutrophils: 63 %
Platelets: 313 10*3/uL (ref 150–450)
RBC: 4.42 x10E6/uL (ref 4.14–5.80)
RDW: 13.4 % (ref 11.6–15.4)
WBC: 5.9 10*3/uL (ref 3.4–10.8)

## 2022-08-05 LAB — VITAMIN B12: Vitamin B-12: 700 pg/mL (ref 232–1245)

## 2022-08-15 DIAGNOSIS — R413 Other amnesia: Secondary | ICD-10-CM | POA: Diagnosis not present

## 2022-08-15 DIAGNOSIS — G479 Sleep disorder, unspecified: Secondary | ICD-10-CM | POA: Diagnosis not present

## 2022-08-15 DIAGNOSIS — F39 Unspecified mood [affective] disorder: Secondary | ICD-10-CM | POA: Diagnosis not present

## 2022-08-15 DIAGNOSIS — R2689 Other abnormalities of gait and mobility: Secondary | ICD-10-CM | POA: Diagnosis not present

## 2022-09-27 DIAGNOSIS — K449 Diaphragmatic hernia without obstruction or gangrene: Secondary | ICD-10-CM | POA: Diagnosis not present

## 2022-09-27 DIAGNOSIS — R0602 Shortness of breath: Secondary | ICD-10-CM | POA: Diagnosis not present

## 2022-10-10 DIAGNOSIS — G629 Polyneuropathy, unspecified: Secondary | ICD-10-CM | POA: Diagnosis not present

## 2022-10-10 DIAGNOSIS — D2371 Other benign neoplasm of skin of right lower limb, including hip: Secondary | ICD-10-CM | POA: Diagnosis not present

## 2022-10-12 ENCOUNTER — Telehealth: Payer: Self-pay | Admitting: Family Medicine

## 2022-10-12 NOTE — Telephone Encounter (Signed)
Copied from CRM [id number]. Topic: Medicare AWV >> Oct 12, 2022  1:24 PM Payton Doughty wrote: Reason for CRM: LM 10/12/2022 to schedule AWV   Verlee Rossetti; Care Guide Ambulatory Clinical Support [facility] [facility] Direct Dial: [phone]

## 2022-11-10 ENCOUNTER — Ambulatory Visit (INDEPENDENT_AMBULATORY_CARE_PROVIDER_SITE_OTHER): Payer: Medicare PPO

## 2022-11-10 VITALS — Ht 67.0 in | Wt 167.0 lb

## 2022-11-10 DIAGNOSIS — Z0000 Encounter for general adult medical examination without abnormal findings: Secondary | ICD-10-CM | POA: Diagnosis not present

## 2022-11-10 NOTE — Progress Notes (Signed)
Subjective:   Clayton [name] is a 88 y.o. male who presents for Medicare Annual/Subsequent preventive examination.  Visit Complete: Virtual  I connected with  Clayton [name] on 11/10/22 by a audio enabled telemedicine application and verified that I am speaking with the correct person using two identifiers.  Patient Location: Home  Provider Location: Office/Clinic  I discussed the limitations of evaluation and management by telemedicine. The patient expressed understanding and agreed to proceed.  Review of Systems     Cardiac Risk Factors include: advanced age (>24men, >9 women);male gender;hypertension;sedentary lifestyle     Objective:    Today's Vitals   11/10/22 0857  Weight: 167 lb (75.8 kg)  Height: 5\' 7"  (1.702 m)   Body mass index is 26.16 kg/m.     11/10/2022    8:50 AM 06/03/2022    4:00 PM 05/24/2022    7:00 PM 05/24/2022   11:03 AM 05/21/2022    8:33 AM 10/27/2021    8:23 AM 10/26/2020    8:28 AM  Advanced Directives  Does Patient Have a Medical Advance Directive? No Yes Yes No Yes Yes Yes  Type of Furniture conservator/restorer;Living will Healthcare Power of [place];Living will  Healthcare Power of [place];Living will Healthcare Power of [place];Living will Healthcare Power of [place];Living will  Does patient want to make changes to medical advance directive?  No - Patient declined No - Patient declined      Copy of Healthcare Power of Attorney in Chart?  No - copy requested No - copy requested   No - copy requested No - copy requested  Would patient like information on creating a medical advance directive? No - Patient declined          Current Medications (verified) Outpatient Encounter Medications as of 11/10/2022  Medication Sig   acetaminophen (TYLENOL) 500 MG tablet Take 1,000 mg by mouth every 6 (six) hours as needed for mild pain or moderate pain.    busPIRone (BUSPAR) 7.5 MG tablet Take by mouth.   Cetirizine HCl  (ZYRTEC PO) Take by mouth.   Cyanocobalamin (VITAMIN B-12 PO) Take by mouth.   Pediatric Multivitamins-Fl (MULTI VIT/FL PO) Take by mouth.   gabapentin (NEURONTIN) 100 MG capsule Take gabapentin 100 mg nightly for 1 week, then increase to 200 mg nightly and continue this dose.   pantoprazole (PROTONIX) 40 MG tablet Take 1 tablet (40 mg total) by mouth daily. (Patient not taking: Reported on 11/10/2022)   [DISCONTINUED] albuterol (VENTOLIN HFA) 108 (90 Base) MCG/ACT inhaler Inhale 2 puffs into the lungs every 4 (four) hours as needed for wheezing or shortness of breath. (Patient not taking: Reported on 08/04/2022)   No facility-administered encounter medications on file as of 11/10/2022.    Allergies (verified) Aspirin and Sulfa antibiotics   History: Past Medical History:  Diagnosis Date   Acute gastrointestinal hemorrhage    Acute hypoxemic respiratory failure (HCC) 05/25/2022   Basal cell carcinoma    Blood in stool 08/30/2018   Blood transfusion without reported diagnosis 31 (age 61)   After traumatic brain injury   Broken ribs    2 BROKEN ON RIBS   CKD (chronic kidney disease)    PT DENIES   DVT (deep venous thrombosis) (HCC)    RIGHT LEG   Fracture dislocation of wrist    RIGHT   GI bleed 09/02/2018   Hiatal hernia with GERD    HTN (hypertension)    Hypokalemia 05/26/2022   Hyponatremia  05/25/2022   Symptomatic anemia 09/03/2018   Past Surgical History:  Procedure Laterality Date   COLONOSCOPY  2014   cleared for 5 yrs   COLONOSCOPY N/A 09/01/2018   Procedure: COLONOSCOPY;  Surgeon: Pasty Spillers, MD;  Location: [facility] ENDOSCOPY;  Service: Endoscopy;  Laterality: N/A;   CRANIECTOMY FOR DEPRESSED SKULL FRACTURE  1960'S   ESOPHAGOGASTRODUODENOSCOPY N/A 09/01/2018   Procedure: ESOPHAGOGASTRODUODENOSCOPY (EGD);  Surgeon: Pasty Spillers, MD;  Location: [facility] ENDOSCOPY;  Service: Endoscopy;  Laterality: N/A;   HERNIA REPAIR     X2   IVC FILTER REMOVAL N/A  07/03/2017   Procedure: IVC FILTER REMOVAL;  Surgeon: Annice Needy, MD;  Location: [facility] INVASIVE [facility];  Service: Cardiovascular;  Laterality: N/A;   LUMBAR LAMINECTOMY/DECOMPRESSION MICRODISCECTOMY N/A 04/20/2020   Procedure: L2/3, L3/4 LAMINECTOMY;  Surgeon: Lucy Chris, MD;  Location: [facility] ORS;  Service: Neurosurgery;  Laterality: N/A;  2ND CASE   PERIPHERAL VASCULAR THROMBECTOMY Right 05/22/2017   Procedure: PERIPHERAL VASCULAR THROMBECTOMY;  Surgeon: Annice Needy, MD;  Location: [facility] IN[facility];  Service: Cardiovascular;  Laterality: Right;   SPINE SURGERY  December 2021   TONSILLECTOMY     Family History  Problem Relation Age of Onset   Heart disease Father    Social History   Socioeconomic History   Marital status: Married    Spouse name: Not on file   Number of children: 3   Years of education: Not on file   Highest education level: Associate degree: occupational, Scientist, product/process development, or vocational program  Occupational History   Not on file  Tobacco Use   Smoking status: Former    Current packs/day: 0.00    Average packs/day: 0.5 packs/day for 3.5 years (1.7 ttl pk-yrs)    Types: Cigarettes    Start date: 07/03/1955    Quit date: 01/01/1959    Years since quitting: 63.9   Smokeless tobacco: Never   Tobacco comments:    I smoked from about age 82 to 72.  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   Social Determinants of Corporate investment banker Strain: Low Risk  (11/10/2022)   Overall Financial Resource Strain (CARDIA)    Difficulty of Paying Living Expenses: Not very hard  Food Insecurity: No Food Insecurity  (11/10/2022)   Hunger Vital Sign    Worried About Running Out of Food in the Last Year: Never true    Ran Out of Food in the Last Year: Never true  Transportation Needs: No Transportation Needs (11/10/2022)   PRAPARE - Administrator, Civil Service (Medical): No    Lack of Transportation (Non-Medical): No  Physical Activity: Inactive (11/10/2022)   Exercise Vital Sign    Days of Exercise per Week: 0 days    Minutes of Exercise per Session: 0 min  Stress: No Stress Concern Present (11/10/2022)   [employer] of Occupational Health - Occupational Stress Questionnaire    Feeling of Stress : Not at all  Social Connections: Moderately Isolated (11/10/2022)   Social Connection  and Isolation Panel [NHANES]    Frequency of Communication with Friends and Family: More than three times a week    Frequency of Social Gatherings with Friends and Family: Three times a week    Attends Religious Services: Never    Active Member of Clubs or Organizations: No    Attends Banker Meetings: Never    Marital Status: Married    Tobacco Counseling Counseling given: Not Answered Tobacco comments: I smoked from about age 13 to 51.   Clinical Intake:  Pre-visit preparation completed: Yes  Pain : No/denies pain     Nutritional Risks: None Diabetes: No  How often do you need to have someone help you when you read instructions, pamphlets, or other written materials from your doctor or pharmacy?: 1 - Never  Interpreter Needed?: No  Information entered by :: Kennedy Bucker, LPN   Activities of Daily Living    11/10/2022    8:51 AM 11/09/2022    3:24 PM  In your present state of health, do you have any difficulty performing the following activities:  Hearing? 1 1  Vision? 0 0  Difficulty concentrating or making decisions? 0 1  Walking or climbing stairs? 0 0  Dressing or bathing? 0 0  Doing errands, shopping? 0 1  Preparing Food and eating ? N N  Using the Toilet? N N   In the past six months, have you accidently leaked urine? N N  Do you have problems with loss of bowel control? N N  Managing your Medications? N Y  Managing your Finances? N Y  Housekeeping or managing your Housekeeping? N N    Patient Care Team: Duanne Limerick, MD as PCP - General (Family Medicine)  Indicate any recent Medical Services you may have received from other than [facility] providers in the past year (date may be approximate).     Assessment:   This is a routine wellness examination for [employer].  Hearing/Vision screen Hearing Screening - Comments:: Wears aids Vision Screening - Comments:: Wears glasses- [facility]  Dietary issues and exercise activities discussed:     Goals Addressed             This Visit's Progress    DIET - EAT MORE FRUITS AND VEGETABLES         Depression Screen    11/10/2022    8:48 AM 08/04/2022    8:37 AM 07/01/2022   10:34 AM 06/10/2022    1:35 PM 06/02/2022    3:10 PM 11/30/2021    8:36 AM 10/27/2021    8:20 AM  PHQ 2/9 Scores  PHQ - 2 Score 0 0 0 0 0 0 0  PHQ- 9 Score 0 1 1 0 0 2     Fall Risk    11/10/2022    8:50 AM 11/09/2022    3:24 PM 08/04/2022    8:38 AM 07/01/2022   10:33 AM 06/10/2022    1:35 PM  Fall Risk   Falls in the past year? 1 1 0 0 0  Number falls in past yr: 1 1 0 0 0  Injury with Fall? 0 1 0 0 0  Risk for fall due to : History of fall(s)  No Fall Risks No Fall Risks No Fall Risks  Follow up Falls evaluation completed;Falls prevention discussed  Falls evaluation completed Falls evaluation completed Falls evaluation completed    MEDICARE RISK AT HOME:  Medicare Risk at Home - 11/10/22 2130  Any stairs in or around the home? Yes    If so, are there any without handrails? No    Home free of loose throw rugs in walkways, pet beds, electrical cords, etc? Yes    Adequate lighting in your home to reduce risk of falls? Yes    Life alert? No    Use of a cane, walker or w/c? No    Grab bars in the bathroom? No     Shower chair or bench in shower? Yes    Elevated toilet seat or a handicapped toilet? No             TIMED UP AND GO:  Was the test performed?  No    Cognitive Function:        11/10/2022    8:51 AM 10/26/2020    8:31 AM 10/23/2019    8:29 AM  6CIT Screen  What Year? 0 points 0 points 0 points  What month? 3 points 0 points 0 points  What time? 0 points 0 points 0 points  Count back from 20 2 points 0 points 0 points  Months in reverse 4 points 4 points 4 points  Repeat phrase 0 points 6 points 8 points  Total Score 9 points 10 points 12 points    Immunizations Immunization History  Administered Date(s) Administered   Influenza-Unspecified 08/12/2020   PFIZER Comirnaty(Gray Top)Covid-19 Tri-Sucrose Vaccine 08/12/2020   PFIZER(Purple Top)SARS-COV-2 Vaccination 05/09/2019, 06/01/2019, 01/29/2020   Tdap 09/14/2016    TDAP status: Up to date  Flu Vaccine status: Declined, Education has been provided regarding the importance of this vaccine but patient still declined. Advised may receive this vaccine at local pharmacy or Health Dept. Aware to provide a copy of the vaccination record if obtained from local pharmacy or Health Dept. Verbalized acceptance and understanding.  Pneumococcal vaccine status: Declined,  Education has been provided regarding the importance of this vaccine but patient still declined. Advised may receive this vaccine at local pharmacy or Health Dept. Aware to provide a copy of the vaccination record if obtained from local pharmacy or Health Dept. Verbalized acceptance and understanding.   Covid-19 vaccine status: Completed vaccines  Qualifies for Shingles Vaccine? Yes   Zostavax completed No   Shingrix Completed?: No.    Education has been provided regarding the importance of this vaccine. Patient has been advised to call insurance company to determine out of pocket expense if they have not yet received this vaccine. Advised may also receive vaccine at  local pharmacy or Health Dept. Verbalized acceptance and understanding.  Screening Tests Health Maintenance  Topic Date Due   Zoster Vaccines- Shingrix (1 of 2) Never done   COVID-19 Vaccine (5 - 2023-24 season) 12/31/2021   Pneumonia Vaccine 26+ Years old (1 of 1 - PCV) 12/01/2022 (Originally 01/27/2002)   INFLUENZA VACCINE  12/01/2022   Medicare Annual Wellness (AWV)  11/10/2023   DTaP/Tdap/Td (2 - Td or Tdap) 09/15/2026   HPV VACCINES  Aged Out    Health Maintenance  Health Maintenance Due  Topic Date Due   Zoster Vaccines- Shingrix (1 of 2) Never done   COVID-19 Vaccine (5 - 2023-24 season) 12/31/2021    Colorectal cancer screening: No longer required.   Lung Cancer Screening: (Low Dose CT Chest recommended if Age 68-80 years, 20 pack-year currently smoking OR have quit w/in 15years.) does not qualify.    Additional Screening:  Hepatitis C Screening: does not qualify; Completed no  Vision Screening: Recommended annual ophthalmology exams  for early detection of glaucoma and other disorders of the eye. Is the patient up to date with their annual eye exam?  Yes  Who is the provider or what is the name of the office in which the patient attends annual eye exams? [facility] If pt is not established with a provider, would they like to be referred to a provider to establish care? No .   Dental Screening: Recommended annual dental exams for proper oral hygiene   Community Resource Referral / Chronic Care Management: CRR required this visit?  No   CCM required this visit?  No     Plan:     I have personally reviewed and noted the following in the patient's chart:   Medical and social history Use of alcohol, tobacco or illicit drugs  Current medications and supplements including opioid prescriptions. Patient is not currently taking opioid prescriptions. Functional ability and status Nutritional status Physical activity Advanced directives List of other  physicians Hospitalizations, surgeries, and ER visits in previous 12 months Vitals Screenings to include cognitive, depression, and falls Referrals and appointments  In addition, I have reviewed and discussed with patient certain preventive protocols, quality metrics, and best practice recommendations. A written personalized care plan for preventive services as well as general preventive health recommendations were provided to patient.     Hal Hope, LPN   10/29/5282   After Visit Summary: (MyChart) Due to this being a telephonic visit, the after visit summary with patients personalized plan was offered to patient via MyChart   Nurse Notes: none

## 2022-11-10 NOTE — Patient Instructions (Signed)
Clayton [name] , Thank you for taking time to come for your Medicare Wellness Visit. I appreciate your ongoing commitment to your health goals. Please review the following plan we discussed and let me know if I can assist you in the future.   These are the goals we discussed:  Goals      DIET - EAT MORE FRUITS AND VEGETABLES     Increase physical activity     Recommend increasing physical activity to at least 3 days per week         This is a list of the screening recommended for you and due dates:  Health Maintenance  Topic Date Due   Zoster (Shingles) Vaccine (1 of 2) Never done   COVID-19 Vaccine (5 - 2023-24 season) 12/31/2021   Pneumonia Vaccine (1 of 1 - PCV) 12/01/2022*   Flu Shot  12/01/2022   Medicare Annual Wellness Visit  11/10/2023   DTaP/Tdap/Td vaccine (2 - Td or Tdap) 09/15/2026   HPV Vaccine  Aged Out  *Topic was postponed. The date shown is not the original due date.    Advanced directives: no  Conditions/risks identified: none  Next appointment: Follow up in one year for your annual wellness visit. 11/16/23 @ 8:45 am by phone  Preventive Care 65 Years and Older, Male  Preventive care refers to lifestyle choices and visits with your health care provider that can promote health and wellness. What does preventive care include? A yearly physical exam. This is also called an annual well check. Dental exams once or twice a year. Routine eye exams. Ask your health care provider how often you should have your eyes checked. Personal lifestyle choices, including: Daily care of your teeth and gums. Regular physical activity. Eating a healthy diet. Avoiding tobacco and drug use. Limiting alcohol use. Practicing safe sex. Taking low doses of aspirin every day. Taking vitamin and mineral supplements as recommended by your health care provider. What happens during an annual well check? The services and screenings done by your health care provider during your annual  well check will depend on your age, overall health, lifestyle risk factors, and family history of disease. Counseling  Your health care provider may ask you questions about your: Alcohol use. Tobacco use. Drug use. Emotional well-being. Home and relationship well-being. Sexual activity. Eating habits. History of falls. Memory and ability to understand (cognition). Work and work Astronomer. Screening  You may have the following tests or measurements: Height, weight, and BMI. Blood pressure. Lipid and cholesterol levels. These may be checked every 5 years, or more frequently if you are over 89 years old. Skin check. Lung cancer screening. You may have this screening every year starting at age 62 if you have a 30-pack-year history of smoking and currently smoke or have quit within the past 15 years. Fecal occult blood test (FOBT) of the stool. You may have this test every year starting at age 66. Flexible sigmoidoscopy or colonoscopy. You may have a sigmoidoscopy every 5 years or a colonoscopy every 10 years starting at age 38. Prostate cancer screening. Recommendations will vary depending on your family history and other risks. Hepatitis C blood test. Hepatitis B blood test. Sexually transmitted disease (STD) testing. Diabetes screening. This is done by checking your blood sugar (glucose) after you have not eaten for a while (fasting). You may have this done every 1-3 years. Abdominal aortic aneurysm (AAA) screening. You may need this if you are a current or former smoker. Osteoporosis. You may  be screened starting at age 65 if you are at high risk. Talk with your health care provider about your test results, treatment options, and if necessary, the need for more tests. Vaccines  Your health care provider may recommend certain vaccines, such as: Influenza vaccine. This is recommended every year. Tetanus, diphtheria, and acellular pertussis (Tdap, Td) vaccine. You may need a Td booster  every 10 years. Zoster vaccine. You may need this after age 19. Pneumococcal 13-valent conjugate (PCV13) vaccine. One dose is recommended after age 44. Pneumococcal polysaccharide (PPSV23) vaccine. One dose is recommended after age 54. Talk to your health care provider about which screenings and vaccines you need and how often you need them. This information is not intended to replace advice given to you by your health care provider. Make sure you discuss any questions you have with your health care provider. Document Released: 05/15/2015 Document Revised: 01/06/2016 Document Reviewed: 02/17/2015 Elsevier Interactive Patient Education  2017 [employer].  Fall Prevention in the Home Falls can cause injuries. They can happen to people of all ages. There are many things you can do to make your home safe and to help prevent falls. What can I do on the outside of my home? Regularly fix the edges of walkways and driveways and fix any cracks. Remove anything that might make you trip as you walk through a door, such as a raised step or threshold. Trim any bushes or trees on the path to your home. Use bright outdoor lighting. Clear any walking paths of anything that might make someone trip, such as rocks or tools. Regularly check to see if handrails are loose or broken. Make sure that both sides of any steps have handrails. Any raised decks and porches should have guardrails on the edges. Have any leaves, snow, or ice cleared regularly. Use sand or salt on walking paths during winter. Clean up any spills in your garage right away. This includes oil or grease spills. What can I do in the bathroom? Use night lights. Install grab bars by the toilet and in the tub and shower. Do not use towel bars as grab bars. Use non-skid mats or decals in the tub or shower. If you need to sit down in the shower, use a plastic, non-slip stool. Keep the floor dry. Clean up any water that spills on the floor as soon  as it happens. Remove soap buildup in the tub or shower regularly. Attach bath mats securely with double-sided non-slip rug tape. Do not have throw rugs and other things on the floor that can make you trip. What can I do in the bedroom? Use night lights. Make sure that you have a light by your bed that is easy to reach. Do not use any sheets or blankets that are too big for your bed. They should not hang down onto the floor. Have a firm chair that has side arms. You can use this for support while you get dressed. Do not have throw rugs and other things on the floor that can make you trip. What can I do in the kitchen? Clean up any spills right away. Avoid walking on wet floors. Keep items that you use a lot in easy-to-reach places. If you need to reach something above you, use a strong step stool that has a grab bar. Keep electrical cords out of the way. Do not use floor polish or wax that makes floors slippery. If you must use wax, use non-skid floor wax. Do not  have throw rugs and other things on the floor that can make you trip. What can I do with my stairs? Do not leave any items on the stairs. Make sure that there are handrails on both sides of the stairs and use them. Fix handrails that are broken or loose. Make sure that handrails are as long as the stairways. Check any carpeting to make sure that it is firmly attached to the stairs. Fix any carpet that is loose or worn. Avoid having throw rugs at the top or bottom of the stairs. If you do have throw rugs, attach them to the floor with carpet tape. Make sure that you have a light switch at the top of the stairs and the bottom of the stairs. If you do not have them, ask someone to add them for you. What else can I do to help prevent falls? Wear shoes that: Do not have high heels. Have rubber bottoms. Are comfortable and fit you well. Are closed at the toe. Do not wear sandals. If you use a stepladder: Make sure that it is fully  opened. Do not climb a closed stepladder. Make sure that both sides of the stepladder are locked into place. Ask someone to hold it for you, if possible. Clearly mark and make sure that you can see: Any grab bars or handrails. First and last steps. Where the edge of each step is. Use tools that help you move around (mobility aids) if they are needed. These include: Canes. Walkers. Scooters. Crutches. Turn on the lights when you go into a dark area. Replace any light bulbs as soon as they burn out. Set up your furniture so you have a clear path. Avoid moving your furniture around. If any of your floors are uneven, fix them. If there are any pets around you, be aware of where they are. Review your medicines with your doctor. Some medicines can make you feel dizzy. This can increase your chance of falling. Ask your doctor what other things that you can do to help prevent falls. This information is not intended to replace advice given to you by your health care provider. Make sure you discuss any questions you have with your health care provider. Document Released: 02/12/2009 Document Revised: 09/24/2015 Document Reviewed: 05/23/2014 Elsevier Interactive Patient Education  2017 [employer].

## 2022-12-02 ENCOUNTER — Encounter: Payer: Medicare PPO | Admitting: Family Medicine

## 2022-12-09 ENCOUNTER — Encounter: Payer: Medicare PPO | Admitting: Family Medicine

## 2022-12-12 ENCOUNTER — Emergency Department: Payer: Medicare PPO

## 2022-12-12 ENCOUNTER — Observation Stay: Payer: Medicare PPO

## 2022-12-12 ENCOUNTER — Inpatient Hospital Stay
Admission: EM | Admit: 2022-12-12 | Discharge: 2022-12-15 | DRG: 064 | Disposition: A | Discharge status: Rehabilitation Facility | Payer: Medicare PPO | Attending: Internal Medicine | Admitting: Internal Medicine

## 2022-12-12 ENCOUNTER — Other Ambulatory Visit: Payer: Self-pay

## 2022-12-12 DIAGNOSIS — E538 Deficiency of other specified B group vitamins: Secondary | ICD-10-CM

## 2022-12-12 DIAGNOSIS — R4189 Other symptoms and signs involving cognitive functions and awareness: Secondary | ICD-10-CM

## 2022-12-12 DIAGNOSIS — I6782 Cerebral ischemia: Secondary | ICD-10-CM | POA: Diagnosis not present

## 2022-12-12 DIAGNOSIS — I63531 Cerebral infarction due to unspecified occlusion or stenosis of right posterior cerebral artery: Secondary | ICD-10-CM | POA: Diagnosis not present

## 2022-12-12 DIAGNOSIS — F0280 Dementia in other diseases classified elsewhere without behavioral disturbance: Secondary | ICD-10-CM | POA: Diagnosis not present

## 2022-12-12 DIAGNOSIS — R4182 Altered mental status, unspecified: Secondary | ICD-10-CM | POA: Diagnosis not present

## 2022-12-12 DIAGNOSIS — F0390 Unspecified dementia without behavioral disturbance: Secondary | ICD-10-CM

## 2022-12-12 DIAGNOSIS — I6621 Occlusion and stenosis of right posterior cerebral artery: Secondary | ICD-10-CM | POA: Diagnosis not present

## 2022-12-12 DIAGNOSIS — Z79899 Other long term (current) drug therapy: Secondary | ICD-10-CM

## 2022-12-12 DIAGNOSIS — Z886 Allergy status to analgesic agent status: Secondary | ICD-10-CM | POA: Diagnosis not present

## 2022-12-12 DIAGNOSIS — K449 Diaphragmatic hernia without obstruction or gangrene: Secondary | ICD-10-CM | POA: Diagnosis present

## 2022-12-12 DIAGNOSIS — R410 Disorientation, unspecified: Secondary | ICD-10-CM | POA: Diagnosis not present

## 2022-12-12 DIAGNOSIS — R4181 Age-related cognitive decline: Secondary | ICD-10-CM | POA: Diagnosis not present

## 2022-12-12 DIAGNOSIS — G9340 Encephalopathy, unspecified: Secondary | ICD-10-CM | POA: Diagnosis not present

## 2022-12-12 DIAGNOSIS — Z85828 Personal history of other malignant neoplasm of skin: Secondary | ICD-10-CM | POA: Diagnosis not present

## 2022-12-12 DIAGNOSIS — Z882 Allergy status to sulfonamides status: Secondary | ICD-10-CM | POA: Diagnosis not present

## 2022-12-12 DIAGNOSIS — I10 Essential (primary) hypertension: Secondary | ICD-10-CM | POA: Diagnosis present

## 2022-12-12 DIAGNOSIS — R531 Weakness: Secondary | ICD-10-CM

## 2022-12-12 DIAGNOSIS — R29703 NIHSS score 3: Secondary | ICD-10-CM | POA: Diagnosis present

## 2022-12-12 DIAGNOSIS — I639 Cerebral infarction, unspecified: Secondary | ICD-10-CM | POA: Diagnosis not present

## 2022-12-12 DIAGNOSIS — I63131 Cerebral infarction due to embolism of right carotid artery: Secondary | ICD-10-CM | POA: Insufficient documentation

## 2022-12-12 DIAGNOSIS — K219 Gastro-esophageal reflux disease without esophagitis: Secondary | ICD-10-CM | POA: Diagnosis present

## 2022-12-12 DIAGNOSIS — R296 Repeated falls: Secondary | ICD-10-CM | POA: Diagnosis present

## 2022-12-12 DIAGNOSIS — Z87820 Personal history of traumatic brain injury: Secondary | ICD-10-CM | POA: Diagnosis not present

## 2022-12-12 DIAGNOSIS — I6389 Other cerebral infarction: Secondary | ICD-10-CM | POA: Diagnosis not present

## 2022-12-12 DIAGNOSIS — Z1152 Encounter for screening for COVID-19: Secondary | ICD-10-CM

## 2022-12-12 DIAGNOSIS — G9341 Metabolic encephalopathy: Secondary | ICD-10-CM | POA: Diagnosis present

## 2022-12-12 DIAGNOSIS — E663 Overweight: Secondary | ICD-10-CM | POA: Insufficient documentation

## 2022-12-12 DIAGNOSIS — Z8249 Family history of ischemic heart disease and other diseases of the circulatory system: Secondary | ICD-10-CM | POA: Diagnosis not present

## 2022-12-12 DIAGNOSIS — S6992XA Unspecified injury of left wrist, hand and finger(s), initial encounter: Secondary | ICD-10-CM | POA: Diagnosis not present

## 2022-12-12 DIAGNOSIS — W19XXXA Unspecified fall, initial encounter: Secondary | ICD-10-CM

## 2022-12-12 DIAGNOSIS — Y92009 Unspecified place in unspecified non-institutional (private) residence as the place of occurrence of the external cause: Secondary | ICD-10-CM

## 2022-12-12 DIAGNOSIS — M19042 Primary osteoarthritis, left hand: Secondary | ICD-10-CM | POA: Diagnosis not present

## 2022-12-12 DIAGNOSIS — I63431 Cerebral infarction due to embolism of right posterior cerebral artery: Principal | ICD-10-CM | POA: Diagnosis present

## 2022-12-12 DIAGNOSIS — G319 Degenerative disease of nervous system, unspecified: Secondary | ICD-10-CM | POA: Diagnosis not present

## 2022-12-12 DIAGNOSIS — Z87891 Personal history of nicotine dependence: Secondary | ICD-10-CM | POA: Diagnosis not present

## 2022-12-12 DIAGNOSIS — Z6838 Body mass index (BMI) 38.0-38.9, adult: Secondary | ICD-10-CM

## 2022-12-12 DIAGNOSIS — I6503 Occlusion and stenosis of bilateral vertebral arteries: Secondary | ICD-10-CM | POA: Diagnosis not present

## 2022-12-12 DIAGNOSIS — I6523 Occlusion and stenosis of bilateral carotid arteries: Secondary | ICD-10-CM | POA: Diagnosis not present

## 2022-12-12 DIAGNOSIS — I672 Cerebral atherosclerosis: Secondary | ICD-10-CM | POA: Diagnosis not present

## 2022-12-12 DIAGNOSIS — I6312 Cerebral infarction due to embolism of basilar artery: Secondary | ICD-10-CM | POA: Diagnosis present

## 2022-12-12 HISTORY — DX: Unspecified intracranial injury with loss of consciousness status unknown, initial encounter: S06.9XAA

## 2022-12-12 HISTORY — DX: Unspecified color vision deficiencies: H53.50

## 2022-12-12 HISTORY — DX: Dyslexia and alexia: R48.0

## 2022-12-12 LAB — URINALYSIS, ROUTINE W REFLEX MICROSCOPIC
Bacteria, UA: NONE SEEN
Bilirubin Urine: NEGATIVE
Glucose, UA: NEGATIVE mg/dL
Hgb urine dipstick: NEGATIVE
Ketones, ur: NEGATIVE mg/dL
Leukocytes,Ua: NEGATIVE
Nitrite: NEGATIVE
Protein, ur: NEGATIVE mg/dL
Specific Gravity, Urine: 1.009 (ref 1.005–1.030)
Squamous Epithelial / HPF: NONE SEEN /HPF (ref 0–5)
WBC, UA: NONE SEEN WBC/hpf (ref 0–5)
pH: 5 (ref 5.0–8.0)

## 2022-12-12 LAB — DIFFERENTIAL
Abs Immature Granulocytes: 0.04 10*3/uL (ref 0.00–0.07)
Basophils Absolute: 0.1 10*3/uL (ref 0.0–0.1)
Basophils Relative: 1 %
Eosinophils Absolute: 0.1 10*3/uL (ref 0.0–0.5)
Eosinophils Relative: 1 %
Immature Granulocytes: 1 %
Lymphocytes Relative: 19 %
Lymphs Abs: 1.3 10*3/uL (ref 0.7–4.0)
Monocytes Absolute: 0.7 10*3/uL (ref 0.1–1.0)
Monocytes Relative: 9 %
Neutro Abs: 4.8 10*3/uL (ref 1.7–7.7)
Neutrophils Relative %: 69 %

## 2022-12-12 LAB — CBC
HCT: 44.9 % (ref 39.0–52.0)
Hemoglobin: 14.4 g/dL (ref 13.0–17.0)
MCH: 31 pg (ref 26.0–34.0)
MCHC: 32.1 g/dL (ref 30.0–36.0)
MCV: 96.6 fL (ref 80.0–100.0)
Platelets: 299 10*3/uL (ref 150–400)
RBC: 4.65 MIL/uL (ref 4.22–5.81)
RDW: 14 % (ref 11.5–15.5)
WBC: 7 10*3/uL (ref 4.0–10.5)
nRBC: 0 % (ref 0.0–0.2)

## 2022-12-12 LAB — RESP PANEL BY RT-PCR (RSV, FLU A&B, COVID)  RVPGX2
Influenza A by PCR: NEGATIVE
Influenza B by PCR: NEGATIVE
Resp Syncytial Virus by PCR: NEGATIVE
SARS Coronavirus 2 by RT PCR: NEGATIVE

## 2022-12-12 LAB — COMPREHENSIVE METABOLIC PANEL
ALT: 20 U/L (ref 0–44)
AST: 27 U/L (ref 15–41)
Albumin: 4.1 g/dL (ref 3.5–5.0)
Alkaline Phosphatase: 65 U/L (ref 38–126)
Anion gap: 10 (ref 5–15)
BUN: 18 mg/dL (ref 8–23)
CO2: 21 mmol/L — ABNORMAL LOW (ref 22–32)
Calcium: 7.6 mg/dL — ABNORMAL LOW (ref 8.9–10.3)
Chloride: 108 mmol/L (ref 98–111)
Creatinine, Ser: 1.19 mg/dL (ref 0.61–1.24)
GFR, Estimated: 60 mL/min — ABNORMAL LOW (ref >60)
Glucose, Bld: 108 mg/dL — ABNORMAL HIGH (ref 70–99)
Potassium: 4.5 mmol/L (ref 3.5–5.1)
Sodium: 139 mmol/L (ref 135–145)
Total Bilirubin: 0.9 mg/dL (ref 0.3–1.2)
Total Protein: 7.5 g/dL (ref 6.5–8.1)

## 2022-12-12 LAB — APTT: aPTT: 62 seconds — ABNORMAL HIGH (ref 24–36)

## 2022-12-12 LAB — ETHANOL: Alcohol, Ethyl (B): <10 mg/dL (ref <10)

## 2022-12-12 LAB — AMMONIA: Ammonia: 10 umol/L (ref 9–35)

## 2022-12-12 LAB — PROTIME-INR
INR: 1.1 (ref 0.8–1.2)
Prothrombin Time: 14.7 seconds (ref 11.4–15.2)

## 2022-12-12 MED ORDER — STROKE: EARLY STAGES OF RECOVERY BOOK: Freq: Once | Status: AC

## 2022-12-12 MED ORDER — BUSPIRONE HCL 15 MG PO TABS
7.5000 mg | ORAL_TABLET | Freq: Two times a day (BID) | ORAL | Status: DC
  Administered 2022-12-12 – 2022-12-15 (×7): 7.5 mg via ORAL
  Filled 2022-12-12 (×3): qty 1
  Filled 2022-12-12: qty 2
  Filled 2022-12-12 (×3): qty 1

## 2022-12-12 MED ORDER — CLOPIDOGREL BISULFATE 75 MG PO TABS
300.0000 mg | ORAL_TABLET | Freq: Once | ORAL | Status: AC
  Administered 2022-12-12: 300 mg via ORAL
  Filled 2022-12-12: qty 4

## 2022-12-12 MED ORDER — CLOPIDOGREL BISULFATE 75 MG PO TABS
75.0000 mg | ORAL_TABLET | Freq: Every day | ORAL | Status: DC
  Administered 2022-12-13 – 2022-12-15 (×3): 75 mg via ORAL
  Filled 2022-12-12 (×3): qty 1

## 2022-12-12 MED ORDER — LACTATED RINGERS IV BOLUS
1000.0000 mL | Freq: Once | INTRAVENOUS | Status: AC
  Administered 2022-12-12: 1000 mL via INTRAVENOUS

## 2022-12-12 MED ORDER — IOHEXOL 350 MG/ML SOLN
75.0000 mL | Freq: Once | INTRAVENOUS | Status: AC | PRN
  Administered 2022-12-12: 75 mL via INTRAVENOUS

## 2022-12-12 NOTE — Assessment & Plan Note (Signed)
Worsening generalized weakness predominantly on left side, confusion over the past 1 to 2 days in setting of baseline cognitive impairment status post TBI CT head negative for any acute abnormalities Noted admission February 2024 with worsening cognitive impairment in the setting of multifocal pneumonia-chest x-ray pending Urinalysis negative for any infection  Given mild left lower extremity weakness, will plan for formal CVA evaluation including MRI of the brain, CT of the head neck, 2D echo, risk stratification labs Will also perform a consult neurology for further evaluation

## 2022-12-12 NOTE — Assessment & Plan Note (Signed)
PPI ?

## 2022-12-12 NOTE — Assessment & Plan Note (Signed)
Baseline cognitive impairment in setting of history of TBI Looks to have had a significant decompensation during the February 2024 admission with further decompensation today Followed by outpatient neurology Continue BuSpar for now Pending formal CVA workup as well as neurology evaluation

## 2022-12-12 NOTE — Plan of Care (Signed)

## 2022-12-12 NOTE — Assessment & Plan Note (Signed)
Blood pressure stable.  Not on any blood pressure medication.

## 2022-12-12 NOTE — Consult Note (Signed)
Neurology Consultation Reason for Consult: Stroke Referring Physician: Ponce Blas  CC: Left-sided numbness  History is obtained from: Patient  HPI: Clayton [name] is a 88 y.o. male with a history of hypertension, CKD who presents with left-sided numbness and "bumping into things."  This all started when he woke up at 6 AM this morning.  There was some concern that he may have had some symptoms prior to yesterday given that he said his balance has been off, but this is not clear.  He is not aware of his visual deficit.  His wife states that he tried to stand up and then was unable to.  He did not realize that he could not stand up, and this caused some commotion.  He was brought into the emergency department where an MRI revealed right PCA territory infarct.   LKW: 8/11 prior to bed tnk given?: no, outside of window Premorbid modified rankin scale: 2  Past Medical History:  Diagnosis Date   Acute gastrointestinal hemorrhage    Acute hypoxemic respiratory failure (HCC) 05/25/2022   Basal cell carcinoma    Blood in stool 08/30/2018   Blood transfusion without reported diagnosis 46 (age 59)   After traumatic brain injury   Broken ribs    2 BROKEN ON RIBS   CKD (chronic kidney disease)    PT DENIES   DVT (deep venous thrombosis) (HCC)    RIGHT LEG   Fracture dislocation of wrist    RIGHT   GI bleed 09/02/2018   Hiatal hernia with GERD    HTN (hypertension)    Hypokalemia 05/26/2022   Hyponatremia 05/25/2022   Symptomatic anemia 09/03/2018     Family History  Problem Relation Age of Onset   Heart disease Father      Social History:  reports that he quit smoking about 63 years ago. His smoking use included cigarettes. He started smoking about 67 years ago. He has a 1.7 pack-year smoking history. He has never used smokeless tobacco. He reports current alcohol use of about 3.0 standard drinks of alcohol per week. He reports that he does not use drugs.   Exam: Current  vital signs: BP (!) 162/92 (BP Location: Left Arm)   Pulse 64   Temp 97.9 F (36.6 C)   Resp 17   Ht 5\' 7"  (1.702 m)   Wt 81.6 kg   SpO2 98%   BMI 28.19 kg/m  Vital signs in last 24 hours: Temp:  [97.9 F (36.6 C)-98.3 F (36.8 C)] 97.9 F (36.6 C) (08/12 1637) Pulse Rate:  [59-68] 64 (08/12 1637) Resp:  [16-18] 17 (08/12 1637) BP: (144-171)/(67-93) 162/92 (08/12 1637) SpO2:  [94 %-98 %] 98 % (08/12 1637) Weight:  [81.6 kg] 81.6 kg (08/12 0746)   Physical Exam  Appears well-developed and well-nourished.   Neuro: Mental Status: Patient is awake, alert, oriented to person, place, gives month as April, year as 2013. Patient is able to give a clear and coherent history. No signs of aphasia or neglect Cranial Nerves: II: L hemianopia. Pupils are equal, round, and reactive to light.   III,IV, VI: EOMI without ptosis or diploplia.  V: Facial sensation is symmetric to temperature VII: Facial movement is symmetric.  VIII: hearing is intact to voice X: Uvula elevates symmetrically XI: Shoulder shrug is symmetric. XII: tongue is midline without atrophy or fasciculations.  Motor: Tone is normal. Bulk is normal. 5/5 strength was present in bilateral arms and right leg, left leg is 4/5 Sensory: Sensation  is symmetric to light touch and temperature in the arms and legs. Cerebellar: FNF intact bilaterally    I have reviewed labs in epic and the results pertinent to this consultation are: Cr 1.19  I have reviewed the images obtained:MRI brain - R PCA stroke CTA - R P1 with distal reconstitiution  Impression: 88 year old gentleman with right PCA stroke.  I suspect that this was thrombotic based on the reconstitution, I suspect this is due to large vessel atherosclerotic disease.  He will still need an embolic workup as well.  Recommendations: - HgbA1c, fasting lipid panel - Frequent neuro checks - Echocardiogram - Prophylactic therapy-Antiplatelet med: plavix 75mg  daily   after 300mg  load  -He has a listed allergy to aspirin, I will discuss this with him tomorrow and begin daily baby aspirin if incorrect - Risk factor modification - Telemetry monitoring - PT consult, OT consult, Speech consult    Ritta Slot, MD [facility] [phone]  If 7pm- 7am, please page neurology on call as listed in [facility].

## 2022-12-12 NOTE — ED Provider Notes (Signed)
[facility] Provider Note    Event Date/Time   First MD Initiated Contact with Patient 12/12/22 [phone]     (approximate)   History   Weakness   HPI Clayton [name] is a 88 y.o. male  HTN, mild cognitive decline, falls history who presents today for weakness.  Patient here with wife who provides most of the history.  She states over the past couple of days patient has had worsening mental status.  He has mild cognitive decline at baseline but this is becoming more severe where he forgets things every couple of minutes.  He has also noticed progressive weakness over the past several days including multiple falls.  She describes them as being too weak to stand not getting off balance when standing.  Patient himself is denying all acute complaints.  Wife is not aware of any obvious shortness of breath, chest pain, nausea, vomiting, congestion, abdominal pain symptoms.    Physical Exam   Triage Vital Signs: ED Triage Vitals  Encounter Vitals Group     BP 12/12/22 0745 (!) 169/76     Systolic BP Percentile --      Diastolic BP Percentile --      Pulse Rate 12/12/22 0745 67     Resp 12/12/22 0745 16     Temp 12/12/22 0745 98 F (36.7 C)     Temp src --      SpO2 12/12/22 0745 94 %     Weight 12/12/22 0746 180 lb (81.6 kg)     Height 12/12/22 0746 5\' 7"  (1.702 m)     Head Circumference --      Peak Flow --      Pain Score 12/12/22 0745 0     Pain Loc --      Pain Education --      Exclude from Growth Chart --     Most recent vital signs: Vitals:   12/12/22 1300 12/12/22 1330  BP: (!) 144/93 (!) 160/86  Pulse: 64 (!) 59  Resp:    Temp:    SpO2: 97% 98%   Physical Exam: I have reviewed the vital signs and nursing notes. General: Awake, alert, no acute distress.  Nontoxic appearing. Head:  Atraumatic, normocephalic.   ENT:  EOM intact, PERRL. Oral mucosa is pink and moist with no lesions. Neck: Neck is supple with full range of motion, No  meningeal signs. Cardiovascular:  RRR, No murmurs. Peripheral pulses palpable and equal bilaterally. Respiratory:  Symmetrical chest wall expansion.  No rhonchi, rales, or wheezes.  Good air movement throughout.  No use of accessory muscles.   Musculoskeletal:  No cyanosis or edema. Moving extremities with full ROM Abdomen:  Soft, nontender, nondistended. Neuro:  GCS 14.  Able to state name and place.  Does not completely member why he is here in the emergency department. moving all four extremities, interacting appropriately. Speech clear.  Sensation intact and equal bilaterally throughout.  Cranial nerves II through X intact grossly on examination. Psych:  Calm, appropriate.   Skin:  Warm, dry, no rash.     ED Results / Procedures / Treatments   Labs (all labs ordered are listed, but only abnormal results are displayed) Labs Reviewed  APTT - Abnormal; Notable for the following components:      Result Value   aPTT 62 (*)    All other components within normal limits  COMPREHENSIVE METABOLIC PANEL - Abnormal; Notable for the following components:   CO2 21 (*)  Glucose, Bld 108 (*)    Calcium 7.6 (*)    GFR, Estimated 60 (*)    All other components within normal limits  URINALYSIS, ROUTINE W REFLEX MICROSCOPIC - Abnormal; Notable for the following components:   Color, Urine STRAW (*)    APPearance CLEAR (*)    All other components within normal limits  RESP PANEL BY RT-PCR (RSV, FLU A&B, COVID)  [id number]  PROTIME-INR  CBC  DIFFERENTIAL  ETHANOL  AMMONIA  HEMOGLOBIN A1C     EKG My EKG interpretation at 7:52 AM: Rate of 65, normal sinus rhythm with prolonged PR interval at 248.  No acute ST elevations or depressions.  T wave inversion present in lead III only.   RADIOLOGY See ED course for my interpretations   PROCEDURES:  Critical Care performed: No  Procedures   MEDICATIONS ORDERED IN ED: Medications   stroke: early stages of recovery book (has no administration  in time range)  busPIRone (BUSPAR) tablet 7.5 mg (has no administration in time range)  lactated ringers bolus 1,000 mL (0 mLs Intravenous Stopped 12/12/22 1401)     IMPRESSION / MDM / ASSESSMENT AND PLAN / ED COURSE  I reviewed the triage vital signs and the nursing notes.                              Differential diagnosis includes, but is not limited to, delirium, worsening confusion secondary to UTI versus pneumonia, ICH  Patient's presentation is most consistent with acute presentation with potential threat to life or bodily function.  Patient is an 88 year old male with history of cognitive decline who presents today for acute encephalopathy.  Symptoms have progressed over the past 24 hours with patient notably being weak.  No focal neurological findings outside of confusion and global weakness.  CT head unremarkable.  Falls were witnessed with no obvious traumatic abnormalities per family member.  Laboratory workup otherwise reassuring.  Given ongoing acute encephalopathy from his baseline, patient is not safe to go home at this time and patient was admitted to hospitalist service for further evaluation and management.  Clinical Course as of 12/12/22 1412  Mon Dec 12, 2022  0930 CT HEAD WO CONTRAST Independently reviewed and interpreted CT head without acute abnormalities.  Chronic atrophy noted. [DW]  0930 CBC unremarkable [DW]  0930 Comprehensive metabolic panel(!) Mild hypocarbia, otherwise unremarkable [DW]  1031 Urinalysis, Routine w reflex microscopic -Urine, Clean Catch(!) No UTI [DW]  1035 Patient with ongoing acute encephalopathy and inability to stand.  Will consult hospitalist for admission for further evaluation. [DW]    Clinical Course User Index [DW] Janith Lima, MD     FINAL CLINICAL IMPRESSION(S) / ED DIAGNOSES   Final diagnoses:  Acute encephalopathy  Cognitive decline  Weakness  Multiple falls     Rx / DC Orders   ED Discharge Orders     None         Note:  This document was prepared using Dragon voice recognition software and may include unintentional dictation errors.   Janith Lima, MD 12/12/22 [phone]

## 2022-12-12 NOTE — Evaluation (Signed)
Physical Therapy Evaluation Patient Details Name: Clayton [name] MRN: [id number] DOB: 03/04/1935 Today's Date: 12/12/2022  History of Present Illness  Clayton [name] is a 88 y.o. male with medical history significant of cognitive impairment, history of TBI, hypertension presenting with weakness, confusion. Admitted for worsening weakness and cognitive impairment. Per MRI impression: "Scattered small acute to subacute infarcts in the right PCA distribution"  Clinical Impression  Pt was pleasant and motivated to participate during the session and put forth good effort throughout. Pt found supine in bed with wife in room. Wife participating in history taking per pt's cognitive baseline. Pt's SpO2 remained high 90's throughout session, HR remained within functional limit for activity. Pt's functional strength is WFL overall, L handed grip strength is slightly weaker than R side. Finger to nose test significantly different with LUE compared to RUE. Left sided extremities experiencing minor ataxic movements compared to R side. Sensation below knee is variable from side to side. Pt able to ambulate with RW 160 feet around unit at [facility], only requiring minor cues for directions. Last portion of ambulation attempted without AD, pt noticeably imbalanced and hesitant, req intermittent min A to maintain balance. Pt will benefit from continued PT services upon discharge to safely address deficits listed in patient problem list for decreased caregiver assistance and eventual return to PLOF.          If plan is discharge home, recommend the following: A little help with walking and/or transfers;A little help with bathing/dressing/bathroom;Assistance with cooking/housework;Direct supervision/assist for medications management;Help with stairs or ramp for entrance;Assist for transportation   Can travel by private vehicle        Equipment Recommendations None recommended by PT  Recommendations for Other  Services  OT consult    Functional Status Assessment Patient has had a recent decline in their functional status and demonstrates the ability to make significant improvements in function in a reasonable and predictable amount of time.     Precautions / Restrictions Precautions Precautions: Fall Restrictions Weight Bearing Restrictions: No      Mobility  Bed Mobility Overal bed mobility: Independent             General bed mobility comments: sat up to EOB with ease    Transfers Overall transfer level: Needs assistance Equipment used: Rolling walker (2 wheels) Transfers: Sit to/from Stand Sit to Stand: Contact guard assist           General transfer comment: CGA for balance, strength adquate for transfer.    Ambulation/Gait Ambulation/Gait assistance: Contact guard assist Gait Distance (Feet): 175 Feet Assistive device: Rolling walker (2 wheels), None Gait Pattern/deviations: Narrow base of support, Drifts right/left, Step-through pattern, Decreased step length - right, Decreased step length - left, Decreased stride length Gait velocity: decreased     General Gait Details: 90% of ambulatory bout with RW around unit, pt showing mild drifting to both sides L> R; self corrects. cues given to walk a steady pace, with smooth turns around unit. 10% of ambulatory bout done with no AD, pt having noticable imbalance and hesitation, variable step lengths seen and some mild staggering/shuffling. Pt has one minor LoB requiring min A from PT to correct completely, pt seemingly unaware of LoB but does report feeling generally off balance during bout.  Stairs            Wheelchair Mobility     Tilt Bed    Modified Rankin (Stroke Patients Only)       Balance Overall  balance assessment: Needs assistance Sitting-balance support: Feet supported, No upper extremity supported Sitting balance-Leahy Scale: Good     Standing balance support: No upper extremity supported,  During functional activity Standing balance-Leahy Scale: Poor Standing balance comment: pt unable to sustain standing posture without RW for UE support                             Pertinent Vitals/Pain Pain Assessment Pain Assessment: No/denies pain    Home Living Family/patient expects to be discharged to:: Private residence Living Arrangements: Spouse/significant other Available Help at Discharge: Family;Available 24 hours/day Type of Home: House Home Access: Stairs to enter Entrance Stairs-Rails: Left Entrance Stairs-Number of Steps: 8   Home Layout: One level Home Equipment: Agricultural consultant (2 wheels);Cane - single point;Shower seat - built in      Prior Function Prior Level of Function : Independent/Modified Independent;Working/employed             Mobility Comments: per wife: pt able to walk around without issue, Indepentdent without use of AD ADLs Comments: Per wife: pt was independent with ADL's     Extremity/Trunk Assessment   Upper Extremity Assessment Upper Extremity Assessment: LUE deficits/detail (Global MMT testing, UE's and LE's are similar bilaterally throughout and [facility] excluding grip strength where R > L) LUE Deficits / Details: Grip strength on LUE < RUE LUE Sensation: WNL LUE Coordination: decreased fine motor;decreased gross motor (fingers to thumb oppositon lacking in L hand, finger to nose test difficuly for pt, unable to navigate finger in straight line to PT finger)    Lower Extremity Assessment Lower Extremity Assessment: Generalized weakness;LLE deficits/detail (Global MMT testing, UE's and LE's are similar bilaterally throughout and [facility] excluding grip strength where R > L) LLE Deficits / Details: variable sensation from knee down on both sides, pt unable to elaborate further just stating sensation felt "very close" to being the same. LLE Sensation: decreased light touch    Cervical / Trunk Assessment Cervical / Trunk Assessment:  Normal  Communication   Communication Communication: Hearing impairment  Cognition Arousal: Alert Behavior During Therapy: WFL for tasks assessed/performed Overall Cognitive Status: History of cognitive impairments - at baseline                                 General Comments: pleasant, unable to elaborate on complex questions        General Comments      Exercises     Assessment/Plan    PT Assessment Patient needs continued PT services  PT Problem List Decreased coordination;Decreased knowledge of use of DME;Decreased safety awareness;Decreased balance;Decreased mobility;Decreased activity tolerance       PT Treatment Interventions DME instruction;Balance training;Gait training;Stair training;Therapeutic activities;Therapeutic exercise;Patient/family education    PT Goals (Current goals can be found in the Care Plan section)  Acute Rehab PT Goals Patient Stated Goal: get back to woodworking PT Goal Formulation: With patient/family Time For Goal Achievement: 12/25/22 Potential to Achieve Goals: Good    Frequency Min 1X/week     Co-evaluation               AM-PAC PT "6 Clicks" Mobility  Outcome Measure Help needed turning from your back to your side while in a flat bed without using bedrails?: None Help needed moving from lying on your back to sitting on the side of a flat bed without using bedrails?: None  Help needed moving to and from a bed to a chair (including a wheelchair)?: A Little Help needed standing up from a chair using your arms (e.g., wheelchair or bedside chair)?: None Help needed to walk in hospital room?: A Little Help needed climbing 3-5 steps with a railing? : A Little 6 Click Score: 21    End of Session Equipment Utilized During Treatment: Gait belt Activity Tolerance: Patient tolerated treatment well Patient left: in chair;with chair alarm set;with family/visitor present;with call bell/phone within reach Nurse  Communication: Mobility status PT Visit Diagnosis: Unsteadiness on feet (R26.81);Ataxic gait (R26.0);Muscle weakness (generalized) (M62.81)    Time: 1620-1700 PT Time Calculation (min) (ACUTE ONLY): 40 min   Charges:                 Cecile Sheerer, SPT 12/12/22, 5:55 PM

## 2022-12-12 NOTE — Progress Notes (Addendum)
Patient received from ED via bed in stable condition. He is alert and oriented X 1 with forgetfulness.pt visitor states that, he fall in ED today, and complained pain and swelling in his left ring finger. but he can move. MD made aware and get a new order for x. Ray. We will monitor him continuously.

## 2022-12-12 NOTE — H&P (Signed)
History and Physical    Patient: Clayton [name] NWG:[id number] DOB: March 20, 1935 DOA: 12/12/2022 DOS: the patient was seen and examined on 12/12/2022 PCP: Duanne Limerick, MD  Patient coming from: Home  Chief Complaint:  Chief Complaint  Patient presents with   Weakness   HPI: Clayton [name] is a 88 y.o. male with medical history significant of cognitive impairment, history of TBI, hypertension presenting with weakness, confusion.  History primarily from patient's wife in the setting of confusion.  Per report, patient with worsening weakness, falls over the past week or so.  Symptoms been more predominant over the past day or so.  Baseline history of cognitive impairment.  Wife states that he is fairly ambulatory and is able to do his woodworking on a fairly regular basis.  Noted admission February 2024 with multifocal pneumonia that resulted in worsening cognitive impairment per the wife.  Has seen outpatient neurology for this.  Was placed on BuSpar as well as Neurontin per report.  No recent infections.  No reports of chest pain or shortness of breath.  No nausea or vomiting.  Weakness seems to predominate on the left side.  Positive difficulty with ambulation.  Non-smoker.  No reported ETOH use.  Presented to the ER afebrile, hemodynamically stable.  White count 7, hemoglobin 14.4, platelets 299, COVID flu RSV negative, ammonia level within normal limits, urinalysis not indicative of infection.  CT head within normal notes.  Chest x-ray pending.  Alcohol level within normal is. Review of Systems: As mentioned in the history of present illness. All other systems reviewed and are negative. Past Medical History:  Diagnosis Date   Acute gastrointestinal hemorrhage    Acute hypoxemic respiratory failure (HCC) 05/25/2022   Basal cell carcinoma    Blood in stool 08/30/2018   Blood transfusion without reported diagnosis 22 (age 57)   After traumatic brain injury   Broken ribs    2  BROKEN ON RIBS   CKD (chronic kidney disease)    PT DENIES   DVT (deep venous thrombosis) (HCC)    RIGHT LEG   Fracture dislocation of wrist    RIGHT   GI bleed 09/02/2018   Hiatal hernia with GERD    HTN (hypertension)    Hypokalemia 05/26/2022   Hyponatremia 05/25/2022   Symptomatic anemia 09/03/2018   Past Surgical History:  Procedure Laterality Date   COLONOSCOPY  2014   cleared for 5 yrs   COLONOSCOPY N/A 09/01/2018   Procedure: COLONOSCOPY;  Surgeon: Pasty Spillers, MD;  Location: [facility] ENDOSCOPY;  Service: Endoscopy;  Laterality: N/A;   CRANIECTOMY FOR DEPRESSED SKULL FRACTURE  1960'S   ESOPHAGOGASTRODUODENOSCOPY N/A 09/01/2018   Procedure: ESOPHAGOGASTRODUODENOSCOPY (EGD);  Surgeon: Pasty Spillers, MD;  Location[facility] ENDOSCOPY;  Service: Endoscopy;  Laterality: N/A;   HERNIA REPAIR     X2   IVC FILTER REMOVAL N/A 07/03/2017   Procedure: IVC FILTER REMOVAL;  Surgeon: Annice Needy, MD;  Location: [facility] INVASIVE CV LAB;  Service: Cardiovascular;  Laterality: N/A;   LUMBAR LAMINECTOMY/DECOMPRESSION MICRODISCECTOMY N/A 04/20/2020   Procedure: L2/3, L3/4 LAMINECTOMY;  Surgeon: Lucy Chris, MD;  Location: [facility] ORS;  Service: Neurosurgery;  Laterality: N/A;  2ND CASE   PERIPHERAL VASCULAR THROMBECTOMY Right 05/22/2017   Procedure: PERIPHERAL VASCULAR THROMBECTOMY;  Surgeon: Annice Needy, MD;  Location: [facility] INVASIVE CV LAB;  Service: Cardiovascular;  Laterality: Right;   SPINE SURGERY  December 2021   TONSILLECTOMY     Social History:  reports that he quit  smoking about 63 years ago. His smoking use included cigarettes. He started smoking about 67 years ago. He has a 1.7 pack-year smoking history. He has never used smokeless tobacco. He reports current alcohol use of about 3.0 standard drinks of alcohol per week. He reports that he does not use drugs.  Allergies  Allergen Reactions   Aspirin Rash   Sulfa Antibiotics Rash    Family History  Problem Relation Age  of Onset   Heart disease Father     Prior to Admission medications   Medication Sig Start Date End Date Taking? Authorizing Provider  acetaminophen (TYLENOL) 500 MG tablet Take 1,000 mg by mouth every 6 (six) hours as needed for mild pain or moderate pain.    Yes [provider]  busPIRone (BUSPAR) 7.5 MG tablet Take by mouth. 06/28/22 06/28/23 Yes [provider]  Cetirizine HCl (ZYRTEC PO) Take by mouth.   Yes [provider]  Cyanocobalamin (VITAMIN B-12 PO) Take by mouth.   Yes [provider]  gabapentin (NEURONTIN) 100 MG capsule Take gabapentin 100 mg nightly for 1 week, then increase to 200 mg nightly and continue this dose. 06/28/22  Yes [provider]  pantoprazole (PROTONIX) 40 MG tablet Take 1 tablet (40 mg total) by mouth daily. 08/04/22  Yes Duanne Limerick, MD  Pediatric Multivitamins-Fl [facility] VIT/FL PO) Take by mouth.   Yes [provider]    Physical Exam: Vitals:   12/12/22 0745 12/12/22 0746 12/12/22 1122 12/12/22 1258  BP: (!) 169/76     Pulse: 67     Resp: 16     Temp: 98 F (36.7 C)   98.3 F (36.8 C)  TempSrc:   Oral Oral  SpO2: 94%     Weight:  81.6 kg    Height:  5\' 7"  (1.702 m)     Physical Exam Constitutional:      Appearance: He is normal weight.  HENT:     Head: Normocephalic.     Mouth/Throat:     Mouth: Mucous membranes are moist.  Cardiovascular:     Rate and Rhythm: Normal rate and regular rhythm.  Pulmonary:     Effort: Pulmonary effort is normal.  Musculoskeletal:     Cervical back: Normal range of motion.     Comments: + mild LLE weakness    Skin:    General: Skin is warm.  Neurological:     Comments: + generalized confusion Mild LLE weakness  Otherwise grossly stable neuro exam    Psychiatric:        Mood and Affect: Mood normal.     Data Reviewed:  There are no new results to review at this time. CT HEAD WO CONTRAST CLINICAL DATA:  Provided history: Mental status  change, unknown cause. Increased falls. Weakness. History of dementia.  EXAM: CT HEAD WITHOUT CONTRAST  TECHNIQUE: Contiguous axial images were obtained from the base of the skull through the vertex without intravenous contrast.  RADIATION DOSE REDUCTION: This exam was performed according to the departmental dose-optimization program which includes automated exposure control, adjustment of the mA and/or kV according to patient size and/or use of iterative reconstruction technique.  COMPARISON:  Brain MRI 07/13/2022.  Head CT 06/02/2022.  FINDINGS: Brain:  Moderate to moderately advanced generalized cerebral atrophy. Mild cerebellar atrophy.  Patchy and ill-defined hypoattenuation within the cerebral white matter, nonspecific but compatible with mild chronic small vessel ischemic disease.  There is no acute intracranial hemorrhage.  No demarcated cortical infarct.  No extra-axial fluid collection.  No evidence of an intracranial mass.  No midline shift.  Vascular: No hyperdense vessel.  Atherosclerotic calcifications.  Skull: No calvarial fracture or aggressive osseous lesion.  Sinuses/Orbits: No mass or acute finding within the imaged orbits. No significant paranasal sinus disease at the imaged levels.  IMPRESSION: 1. No evidence of an acute intracranial abnormality. 2. Mild chronic small vessel ischemic changes within the cerebral white matter. 3. Moderate to moderately advanced generalized cerebral atrophy. 4. Mild cerebellar atrophy.  Electronically Signed   By: Jackey Loge D.O.   On: 12/12/2022 08:23  Lab Results  Component Value Date   WBC 7.0 12/12/2022   HGB 14.4 12/12/2022   HCT 44.9 12/12/2022   MCV 96.6 12/12/2022   PLT 299 12/12/2022   Last metabolic panel Lab Results  Component Value Date   GLUCOSE 108 (H) 12/12/2022   NA 139 12/12/2022   K 4.5 12/12/2022   CL 108 12/12/2022   CO2 21 (L) 12/12/2022   BUN 18 12/12/2022   CREATININE  1.19 12/12/2022   GFRNONAA 60 (L) 12/12/2022   CALCIUM 7.6 (L) 12/12/2022   PHOS 3.4 11/30/2021   PROT 7.5 12/12/2022   ALBUMIN 4.1 12/12/2022   LABGLOB 2.8 11/26/2019   AGRATIO 1.6 11/26/2019   BILITOT 0.9 12/12/2022   ALKPHOS 65 12/12/2022   AST 27 12/12/2022   ALT 20 12/12/2022   ANIONGAP 10 12/12/2022    Assessment and Plan: * Weakness Worsening generalized weakness predominantly on left side, confusion over the past 1 to 2 days in setting of baseline cognitive impairment status post TBI CT head negative for any acute abnormalities Noted admission February 2024 with worsening cognitive impairment in the setting of multifocal pneumonia-chest x-ray pending Urinalysis negative for any infection  Given mild left lower extremity weakness, will plan for formal CVA evaluation including MRI of the brain, CT of the head neck, 2D echo, risk stratification labs Will also perform a consult neurology for further evaluation   Cognitive impairment Baseline cognitive impairment in setting of history of TBI Looks to have had a significant decompensation during the February 2024 admission with further decompensation today Followed by outpatient neurology Continue BuSpar for now Pending formal CVA workup as well as neurology evaluation    HTN (hypertension) BP stable Titrate home regimen  Hiatal hernia with GERD PPI      Advance Care Planning:   Code Status: Full Code   Consults: Neurology   Family Communication: Wife at the bedside   Severity of Illness: The appropriate patient status for this patient is OBSERVATION. Observation status is judged to be reasonable and necessary in order to provide the required intensity of service to ensure the patient's safety. The patient's presenting symptoms, physical exam findings, and initial radiographic and laboratory data in the context of their medical condition is felt to place them at decreased risk for further clinical deterioration.  Furthermore, it is anticipated that the patient will be medically stable for discharge from the hospital within 2 midnights of admission.   Author: Floydene Flock, MD 12/12/2022 1:20 PM  For on call review [email or web address].

## 2022-12-12 NOTE — ED Triage Notes (Signed)
Pt to ED for increased falls and weakness over the past week. Hx dementia. Denies injuries.

## 2022-12-12 NOTE — ED Notes (Signed)
First Nurse Note: Patient to ED via [facility] from home for increased falls/weakness. Pt denies any complaints at this time. Hx of dementia. VS WNL with EMS.

## 2022-12-13 ENCOUNTER — Observation Stay: Admit: 2022-12-13 | Payer: Medicare PPO

## 2022-12-13 DIAGNOSIS — I6389 Other cerebral infarction: Secondary | ICD-10-CM

## 2022-12-13 DIAGNOSIS — K449 Diaphragmatic hernia without obstruction or gangrene: Secondary | ICD-10-CM | POA: Diagnosis present

## 2022-12-13 DIAGNOSIS — K219 Gastro-esophageal reflux disease without esophagitis: Secondary | ICD-10-CM | POA: Diagnosis present

## 2022-12-13 DIAGNOSIS — I639 Cerebral infarction, unspecified: Secondary | ICD-10-CM | POA: Diagnosis not present

## 2022-12-13 DIAGNOSIS — Z87891 Personal history of nicotine dependence: Secondary | ICD-10-CM | POA: Diagnosis not present

## 2022-12-13 DIAGNOSIS — G9341 Metabolic encephalopathy: Secondary | ICD-10-CM

## 2022-12-13 DIAGNOSIS — Z6838 Body mass index (BMI) 38.0-38.9, adult: Secondary | ICD-10-CM | POA: Diagnosis not present

## 2022-12-13 DIAGNOSIS — R7989 Other specified abnormal findings of blood chemistry: Secondary | ICD-10-CM | POA: Diagnosis not present

## 2022-12-13 DIAGNOSIS — Z8249 Family history of ischemic heart disease and other diseases of the circulatory system: Secondary | ICD-10-CM | POA: Diagnosis not present

## 2022-12-13 DIAGNOSIS — R410 Disorientation, unspecified: Secondary | ICD-10-CM | POA: Diagnosis not present

## 2022-12-13 DIAGNOSIS — K922 Gastrointestinal hemorrhage, unspecified: Secondary | ICD-10-CM | POA: Diagnosis not present

## 2022-12-13 DIAGNOSIS — E663 Overweight: Secondary | ICD-10-CM | POA: Insufficient documentation

## 2022-12-13 DIAGNOSIS — Z882 Allergy status to sulfonamides status: Secondary | ICD-10-CM | POA: Diagnosis not present

## 2022-12-13 DIAGNOSIS — G4700 Insomnia, unspecified: Secondary | ICD-10-CM | POA: Diagnosis not present

## 2022-12-13 DIAGNOSIS — Z886 Allergy status to analgesic agent status: Secondary | ICD-10-CM | POA: Diagnosis not present

## 2022-12-13 DIAGNOSIS — I6312 Cerebral infarction due to embolism of basilar artery: Secondary | ICD-10-CM | POA: Diagnosis present

## 2022-12-13 DIAGNOSIS — R531 Weakness: Secondary | ICD-10-CM | POA: Diagnosis present

## 2022-12-13 DIAGNOSIS — I63431 Cerebral infarction due to embolism of right posterior cerebral artery: Secondary | ICD-10-CM | POA: Diagnosis present

## 2022-12-13 DIAGNOSIS — Z1152 Encounter for screening for COVID-19: Secondary | ICD-10-CM | POA: Diagnosis not present

## 2022-12-13 DIAGNOSIS — F05 Delirium due to known physiological condition: Secondary | ICD-10-CM | POA: Diagnosis not present

## 2022-12-13 DIAGNOSIS — F0390 Unspecified dementia without behavioral disturbance: Secondary | ICD-10-CM | POA: Diagnosis present

## 2022-12-13 DIAGNOSIS — Z85828 Personal history of other malignant neoplasm of skin: Secondary | ICD-10-CM | POA: Diagnosis not present

## 2022-12-13 DIAGNOSIS — Z79899 Other long term (current) drug therapy: Secondary | ICD-10-CM | POA: Diagnosis not present

## 2022-12-13 DIAGNOSIS — R4189 Other symptoms and signs involving cognitive functions and awareness: Secondary | ICD-10-CM | POA: Diagnosis not present

## 2022-12-13 DIAGNOSIS — I63131 Cerebral infarction due to embolism of right carotid artery: Secondary | ICD-10-CM | POA: Diagnosis not present

## 2022-12-13 DIAGNOSIS — I63531 Cerebral infarction due to unspecified occlusion or stenosis of right posterior cerebral artery: Secondary | ICD-10-CM

## 2022-12-13 DIAGNOSIS — R296 Repeated falls: Secondary | ICD-10-CM | POA: Diagnosis present

## 2022-12-13 DIAGNOSIS — I63331 Cerebral infarction due to thrombosis of right posterior cerebral artery: Secondary | ICD-10-CM | POA: Diagnosis not present

## 2022-12-13 DIAGNOSIS — Z87820 Personal history of traumatic brain injury: Secondary | ICD-10-CM | POA: Diagnosis not present

## 2022-12-13 DIAGNOSIS — I63411 Cerebral infarction due to embolism of right middle cerebral artery: Secondary | ICD-10-CM | POA: Diagnosis not present

## 2022-12-13 DIAGNOSIS — R29703 NIHSS score 3: Secondary | ICD-10-CM | POA: Diagnosis present

## 2022-12-13 DIAGNOSIS — I10 Essential (primary) hypertension: Secondary | ICD-10-CM | POA: Diagnosis not present

## 2022-12-13 DIAGNOSIS — N179 Acute kidney failure, unspecified: Secondary | ICD-10-CM | POA: Diagnosis not present

## 2022-12-13 MED ORDER — HALOPERIDOL 0.5 MG PO TABS: 2.0000 mg | ORAL_TABLET | Freq: Four times a day (QID) | ORAL | Status: DC | PRN

## 2022-12-13 MED ORDER — ACETAMINOPHEN 325 MG PO TABS
650.0000 mg | ORAL_TABLET | ORAL | Status: DC | PRN
  Administered 2022-12-13 – 2022-12-14 (×2): 650 mg via ORAL
  Filled 2022-12-13 (×3): qty 2

## 2022-12-13 MED ORDER — QUETIAPINE FUMARATE 25 MG PO TABS
25.0000 mg | ORAL_TABLET | Freq: Every day | ORAL | Status: DC
  Administered 2022-12-13 – 2022-12-14 (×2): 25 mg via ORAL
  Filled 2022-12-13 (×2): qty 1

## 2022-12-13 MED ORDER — ZIPRASIDONE MESYLATE 20 MG IM SOLR: 10.0000 mg | Freq: Four times a day (QID) | INTRAMUSCULAR | Status: DC | PRN

## 2022-12-13 MED ORDER — ATORVASTATIN CALCIUM 20 MG PO TABS
40.0000 mg | ORAL_TABLET | Freq: Every day | ORAL | Status: DC
  Administered 2022-12-14 – 2022-12-15 (×2): 40 mg via ORAL
  Filled 2022-12-13 (×3): qty 2

## 2022-12-13 MED ORDER — OLANZAPINE 5 MG PO TBDP
2.5000 mg | ORAL_TABLET | Freq: Four times a day (QID) | ORAL | Status: DC | PRN
  Administered 2022-12-13 (×2): 2.5 mg via ORAL
  Filled 2022-12-13 (×3): qty 0.5

## 2022-12-13 MED ORDER — HALOPERIDOL LACTATE 5 MG/ML IJ SOLN: 2.0000 mg | Freq: Four times a day (QID) | INTRAMUSCULAR | Status: DC | PRN

## 2022-12-13 MED ORDER — HALOPERIDOL LACTATE 5 MG/ML IJ SOLN
2.0000 mg | Freq: Four times a day (QID) | INTRAMUSCULAR | Status: DC | PRN
  Administered 2022-12-13: 2 mg via INTRAMUSCULAR
  Filled 2022-12-13: qty 1

## 2022-12-13 NOTE — Evaluation (Signed)
Speech Language Pathology Evaluation Patient Details Name: Clayton [name] MRN: [id number] DOB: 1934/08/15 Today's Date: 12/13/2022 Time: 4098-1191 SLP Time Calculation (min) (ACUTE ONLY): 65 min  Problem List:  Patient Active Problem List   Diagnosis Date Noted   Embolic stroke involving right carotid artery (HCC) 12/13/2022   Overweight (BMI 25.0-29.9) 12/13/2022   Acute stroke due to embolism of basilar artery (HCC) 12/13/2022   Weakness 12/12/2022   Cognitive impairment 12/12/2022   SOB (shortness of breath) 06/02/2022   Acute respiratory failure with hypoxia (HCC) 05/25/2022   Multifocal pneumonia 05/24/2022   Acute metabolic encephalopathy 05/24/2022   Falls 05/24/2022   Acute left-sided low back pain with left-sided sciatica 02/25/2020   Lumbar stenosis with neurogenic claudication 02/25/2020   Diverticulosis of colon with hemorrhage    Hiatal hernia with GERD    Diverticulosis of small intestine without hemorrhage    HTN (hypertension) 08/30/2018   Deep vein thrombosis (DVT) of proximal vein of right lower extremity (HCC) 06/14/2017   Past Medical History:  Past Medical History:  Diagnosis Date   Acute gastrointestinal hemorrhage    Acute hypoxemic respiratory failure (HCC) 05/25/2022   Basal cell carcinoma    Blood in stool 08/30/2018   Blood transfusion without reported diagnosis 37 (age 56)   After traumatic brain injury   Broken ribs    2 BROKEN ON RIBS   CKD (chronic kidney disease)    PT DENIES   DVT (deep venous thrombosis) (HCC)    RIGHT LEG   Fracture dislocation of wrist    RIGHT   GI bleed 09/02/2018   Hiatal hernia with GERD    HTN (hypertension)    Hypokalemia 05/26/2022   Hyponatremia 05/25/2022   Symptomatic anemia 09/03/2018   Past Surgical History:  Past Surgical History:  Procedure Laterality Date   COLONOSCOPY  2014   cleared for 5 yrs   COLONOSCOPY N/A 09/01/2018   Procedure: COLONOSCOPY;  Surgeon: Pasty Spillers, MD;   Location: [facility] ENDOSCOPY;  Service: Endoscopy;  Laterality: N/A;   CRANIECTOMY FOR DEPRESSED SKULL FRACTURE  1960'S   ESOPHAGOGASTRODUODENOSCOPY N/A 09/01/2018   Procedure: ESOPHAGOGASTRODUODENOSCOPY (EGD);  Surgeon: Pasty Spillers, MD;  Location: [facility] ENDOSCOPY;  Service: Endoscopy;  Laterality: N/A;   HERNIA REPAIR     X2   IVC FILTER REMOVAL N/A 07/03/2017   Procedure: IVC FILTER REMOVAL;  Surgeon: Annice Needy, MD;  Location: [facility] INVASIVE CV LAB;  Service: Cardiovascular;  Laterality: N/A;   LUMBAR LAMINECTOMY/DECOMPRESSION MICRODISCECTOMY N/A 04/20/2020   Procedure: L2/3, L3/4 LAMINECTOMY;  Surgeon: Lucy Chris, MD;  Location: [facility] ORS;  Service: Neurosurgery;  Laterality: N/A;  2ND CASE   PERIPHERAL VASCULAR THROMBECTOMY Right 05/22/2017   Procedure: PERIPHERAL VASCULAR THROMBECTOMY;  Surgeon: Annice Needy, MD;  Location: [facility] INVASIVE CV LAB;  Service: Cardiovascular;  Laterality: Right;   SPINE SURGERY  December 2021   TONSILLECTOMY     HPI:  Pt is a 88 y.o. male with medical history significant of Cognitive Impairment w/ Mood disorder per Outpatient Neurology notes, hypertension, CKD, history of TBI, falls recently presenting with weakness, confusion. Admitted for worsening weakness and cognitive impairment status post Fall and TBI. Per MRI impression: "Moderate to moderately advanced generalized cerebral atrophy. Mild  cerebellar atrophy. Scattered small acute to subacute infarcts in the right PCA distribution.".   Assessment / Plan / Recommendation Clinical Impression   Pt seen today for informal cognitive-communication and language screening at bedside. Pt sitting in chair finishing his breakfast meal  and OT session. It was strongly noted that pt had neglected the LEFT side of his tray; Hemianopia suspected, then confirmed by Neurology later during consultation. Pt awake, Daughter present in room. Pt was talkative and responsive to questions; increased verbal output w/ old  memories/information vs new information. Pt has a Baseline dx of Dementia per chart/Neurology: "memory loss" per Outpatient Neurology note. Daughter stated she was aware of this also. Noted decreased awareness and Impulsivity during session and w/ decision-making.   Noted MRI results indicating "Moderate to moderately advanced generalized cerebral atrophy. Mild  cerebellar atrophy. Scattered small acute to subacute infarcts in the right PCA distribution.". Pt lives at home w/ Wife; still works at his shop as an Tree surgeon.  On RA; afebrile. WBC wnl.   Pt presents with grossly functional communication abilities to make wants/needs known in setting of known Dementia w/ basic, known tasks. Impairments characterized by deficits in the areas of lengthy attention, memory recall, mental flexibility/awareness and problem-solving. Deficits could impact ability to independently manage unknown situations and problem-solve, manage medications, and maintain safety in the home environment.    Pt's language skills were impacted by Cognitive-linguistic deficits and potentially new R PCA infarct c/b hesitations and reduced details w/ new information/questions of situations(pt was unaware of why he came to the hospital, recent Falls at home). Some lengthy responses were c/b confusion of question/answer and repetition of some material/statements. He was not able to identify overt problem situations/solutions but could indicate need for "911" contact during an emergency situation -- was unable to see the numbers to dial "911" on phone presented d/t the Hemianopia. He was able to complete basic/functional tasks in setting of basic ADLs: self-feeding, utensil use, when to order breakfast/lunch/dinner though many answers lacked details. He was able to answer simple Y/N questions and follow 1-step commands; grossly functional receptive/expressive language skills apparent but deficits were noted -- suspect in setting of of known  Cognitive decline impacted the more focus/attention needed for language tasks.   Unsure of how close to/at his previous functional Cognitive-communication Baseline he is w/out his Wife present; Cognitive decline/Dementia can impact communication abilities especially in new settings and in Acute medical illness. F/u for Cognitive-linguistic intervention could be beneficial w/ Cognitive ADLs in his D/C environment and is recommended at his next venue of care. This will help determine Baseline Cognitive-linguistic functioning outside of Acute illness and when Wife can be present for information of daily habits/communication.   Recommend further Acute ST services at D/C to the next venue of care to further assess Cognitive-communication skills/needs in ADL setting. This was discussed w/ pt and Daughter present who agreed. Recommend 24/7 Supervision for Safety at D/C. Medical Team updated.       SLP Assessment  SLP Recommendation/Assessment: Patient needs continued Speech Lanaguage Pathology Services SLP Visit Diagnosis: Cognitive communication deficit (R41.841) (Baseline Dementia)    Recommendations for follow up therapy are one component of a multi-disciplinary discharge planning process, led by the attending physician.  Recommendations may be updated based on patient status, additional functional criteria and insurance authorization.    Follow Up Recommendations  Follow physician's recommendations for discharge plan and follow up therapies    Assistance Recommended at Discharge  Frequent or constant Supervision/Assistance  Functional Status Assessment Patient has had a recent decline in their functional status and/or demonstrates limited ability to make significant improvements in function in a reasonable and predictable amount of time (d/t baseline Dementia)  Frequency and Duration  (TBD at next venue of  care)   (TBD)      SLP Evaluation Cognition  Overall Cognitive Status: History of  cognitive impairments - at baseline Arousal/Alertness: Awake/alert Orientation Level: Oriented to person (only) Attention: Focused;Sustained Focused Attention: Impaired Focused Attention Impairment: Verbal basic;Functional basic Sustained Attention: Impaired Sustained Attention Impairment: Verbal basic;Functional basic Memory: Impaired (baseline) Awareness: Impaired Awareness Impairment: Anticipatory impairment Problem Solving: Impaired Problem Solving Impairment: Verbal basic;Functional basic Executive Function: Reasoning;Sequencing Reasoning: Impaired Reasoning Impairment: Verbal basic;Functional basic Sequencing: Impaired Sequencing Impairment: Verbal basic;Functional basic Behaviors: Restless;Impulsive (requires redirection) Safety/Judgment: Impaired Comments: impulsively gets up; difficulty walking around things safely       Comprehension  Auditory Comprehension Overall Auditory Comprehension: Impaired (grossly WFL for BASIC tasks though; suspect cognitive impact on language skills) Yes/No Questions: Within Functional Limits (BASIC) Commands: Within Functional Limits (1-step) Conversation: Simple Interfering Components: Attention;Working memory;Visual impairments Counsellor: Not tested Reading Comprehension Reading Status: Impaired (d/t visual impairment of Left hemianopia) Word level: Within functional limits Functional Environmental (signs, name badge): Impaired Interfering Components: Left neglect/inattention    Expression Expression Primary Mode of Expression: Verbal Verbal Expression Overall Verbal Expression: Impaired (grossly for BASIC tasks; though suspect cognitive impact on language skills) Initiation: Impaired (new information being discussed) Automatic Speech: Name;Social Response;Counting;Day of week (wfl) Level of Generative/Spontaneous Verbalization: Phrase Repetition: Impaired Level of Impairment: Phrase  level Naming: Impairment Responsive: 51-75% accurate Confrontation: Not tested Other Naming Comments: hesitations Verbal Errors: Language of confusion;Not aware of errors Effective Techniques: Sentence completion Non-Verbal Means of Communication: Not applicable Written Expression Dominant Hand: Right Written Expression: Not tested   Oral / Motor  Oral Motor/Sensory Function Overall Oral Motor/Sensory Function: Within functional limits (grossly) Motor Speech Phonation: Low vocal intensity Resonance: Within functional limits Articulation: Impaired (slight - decreased effort) Level of Impairment: Sentence Intelligibility: Intelligible              Jerilynn Som, MS, CCC-SLP Speech Language Pathologist Rehab Services; [facility] [phone] (ascom) , 12/13/2022, 3:25 PM

## 2022-12-13 NOTE — Progress Notes (Signed)
*  PRELIMINARY RESULTS* Echocardiogram 2D Echocardiogram has been performed.  Clayton [name] 12/13/2022, 8:51 AM

## 2022-12-13 NOTE — Plan of Care (Signed)
  Problem: Clinical Measurements: Goal: Ability to maintain clinical measurements within normal limits will improve Outcome: Progressing Goal: Will remain free from infection Outcome: Progressing Goal: Diagnostic test results will improve Outcome: Progressing Goal: Respiratory complications will improve Outcome: Progressing Goal: Cardiovascular complication will be avoided Outcome: Progressing   Problem: Activity: Goal: Risk for activity intolerance will decrease Outcome: Progressing   Problem: Nutrition: Goal: Adequate nutrition will be maintained Outcome: Progressing   Problem: Coping: Goal: Level of anxiety will decrease Outcome: Progressing   Problem: Elimination: Goal: Will not experience complications related to bowel motility Outcome: Progressing Goal: Will not experience complications related to urinary retention Outcome: Progressing   Problem: Pain Managment: Goal: General experience of comfort will improve Outcome: Progressing   Problem: Safety: Goal: Ability to remain free from injury will improve Outcome: Progressing   Problem: Skin Integrity: Goal: Risk for impaired skin integrity will decrease Outcome: Progressing   Problem: Ischemic Stroke/TIA Tissue Perfusion: Goal: Complications of ischemic stroke/TIA will be minimized Outcome: Progressing   Problem: Coping: Goal: Will verbalize positive feelings about self Outcome: Progressing Goal: Will identify appropriate support needs Outcome: Progressing   Problem: Self-Care: Goal: Ability to participate in self-care as condition permits will improve Outcome: Progressing Goal: Verbalization of feelings and concerns over difficulty with self-care will improve Outcome: Progressing Goal: Ability to communicate needs accurately will improve Outcome: Progressing   Problem: Nutrition: Goal: Risk of aspiration will decrease Outcome: Progressing Goal: Dietary intake will improve Outcome: Progressing    Problem: Education: Goal: Knowledge of General Education information will improve Description: Including pain rating scale, medication(s)/side effects and non-pharmacologic comfort measures Outcome: Not Progressing Note: Patient experiencing confusion at this time. Will continue to educate and reorient as needed.   Problem: Health Behavior/Discharge Planning: Goal: Ability to manage health-related needs will improve Outcome: Not Progressing Note: Patient experiencing confusion at this time. Will continue to educate and reorient as needed.   Problem: Education: Goal: Knowledge of disease or condition will improve Outcome: Not Progressing Note: Patient experiencing confusion at this time. Will continue to educate and reorient as needed. Goal: Knowledge of secondary prevention will improve (MUST DOCUMENT ALL) Outcome: Not Progressing Note: Patient experiencing confusion at this time. Will continue to educate and reorient as needed. Goal: Knowledge of patient specific risk factors will improve Loraine Leriche N/A or DELETE if not current risk factor) Outcome: Not Progressing Note: Patient experiencing confusion at this time. Will continue to educate and reorient as needed.   Problem: Health Behavior/Discharge Planning: Goal: Ability to manage health-related needs will improve Outcome: Not Progressing Note: Patient experiencing confusion at this time. Will continue to educate and reorient as needed. Goal: Goals will be collaboratively established with patient/family Outcome: Not Progressing Note: Patient experiencing confusion at this time. Will continue to educate and reorient as needed.

## 2022-12-13 NOTE — Progress Notes (Signed)
Patient refusing telemetry monitor, MD made aware. Perlie Mayo, RN

## 2022-12-13 NOTE — Hospital Course (Signed)
Clayton [name] is a 88 y.o. male with medical history significant of cognitive impairment, history of TBI, hypertension presenting with weakness, confusion.  Is seen by neurology, MRI of the brain showed scattered small acute to subacute infarcts in the right PCA distribution.  LDL 98, HDL 39.  A1c 6.1.  Patient is treated with atorvastatin, Plavix.  He previous had reaction to aspirin, could not tolerated.  Decision is made to treat with Plavix for long-term. Echocardiogram was also performed, showed normal ejection fraction without a source of thrombosis. ZIO recorder is placed. However, patient continues to have significant weakness, PT/OT recommend CIR placement.

## 2022-12-13 NOTE — Progress Notes (Signed)
Physical Therapy Treatment Patient Details Name: Clayton [name] MRN: [id number] DOB: 1935-03-21 Today's Date: 12/13/2022   History of Present Illness Clayton [name] is a 88 y.o. male with medical history significant of cognitive impairment, history of TBI, hypertension presenting with weakness, confusion. Admitted for worsening weakness and cognitive impairment status post TBI. Per MRI impression: "Scattered small acute to subacute infarcts in the right PCA distribution."    PT Comments  Pt much more confused this session with session initiated to assist pt with toileting.  Pt required min to mod A for stability with ambulation both with hand held assist and with a RW with pt presenting with difficulty following any commands.  Pt impulsive and agitated in general and required extensive cuing and encouragement to re-direct in effort to keep pt safe during mobility.  Nursing aware and in room at end of session to assist. Pt will benefit from continued PT services upon discharge to safely address deficits listed in patient problem list for decreased caregiver assistance and eventual return to PLOF.      If plan is discharge home, recommend the following: A little help with walking and/or transfers;A little help with bathing/dressing/bathroom;Assistance with cooking/housework;Direct supervision/assist for medications management;Help with stairs or ramp for entrance;Assist for transportation   Can travel by private vehicle        Equipment Recommendations  None recommended by PT    Recommendations for Other Services       Precautions / Restrictions Precautions Precautions: Fall Restrictions Weight Bearing Restrictions: No     Mobility  Bed Mobility Overal bed mobility: Modified Independent             General bed mobility comments: Min extra time and effort only    Transfers Overall transfer level: Needs assistance Equipment used: Rolling walker (2 wheels), 1 person  hand held assist Transfers: Sit to/from Stand Sit to Stand: Min assist           General transfer comment: Min A for stability    Ambulation/Gait Ambulation/Gait assistance: Min assist, Mod assist Gait Distance (Feet): 15 Feet Assistive device: Rolling walker (2 wheels), 1 person hand held assist Gait Pattern/deviations: Drifts right/left, Staggering right, Staggering left, Trunk flexed Gait velocity: decreased     General Gait Details: Min to mod A for stability this session with pt presenting with difficulty following commands for safe use of RW   Stairs             Wheelchair Mobility     Tilt Bed    Modified Rankin (Stroke Patients Only)       Balance Overall balance assessment: Needs assistance Sitting-balance support: Feet supported, No upper extremity supported Sitting balance-Leahy Scale: Fair     Standing balance support: Single extremity supported, Bilateral upper extremity supported, During functional activity Standing balance-Leahy Scale: Poor Standing balance comment: Min to mod A for stability in standing                            Cognition Arousal: Alert Behavior During Therapy: Agitated, Impulsive Overall Cognitive Status: Impaired/Different from baseline Area of Impairment: Following commands, Safety/judgement, Awareness                       Following Commands: Follows one step commands inconsistently Safety/Judgement: Decreased awareness of safety, Decreased awareness of deficits     General Comments: Pt much more agitated and confused this session, difficulty following commands  Exercises      General Comments        Pertinent Vitals/Pain Pain Assessment Pain Assessment: PAINAD Breathing: normal Negative Vocalization: none Facial Expression: smiling or inexpressive Body Language: tense, distressed pacing, fidgeting Consolability: distracted or reassured by voice/touch PAINAD Score: 2     Home Living     Available Help at Discharge: Family Type of Home: House                  Prior Function            PT Goals (current goals can now be found in the care plan section) Progress towards PT goals: Not progressing toward goals - comment (Limited by cognition/confusion this session)    Frequency    Min 1X/week      PT Plan      Co-evaluation              AM-PAC PT "6 Clicks" Mobility   Outcome Measure  Help needed turning from your back to your side while in a flat bed without using bedrails?: A Little Help needed moving from lying on your back to sitting on the side of a flat bed without using bedrails?: A Little Help needed moving to and from a bed to a chair (including a wheelchair)?: A Lot Help needed standing up from a chair using your arms (e.g., wheelchair or bedside chair)?: A Lot Help needed to walk in hospital room?: A Lot Help needed climbing 3-5 steps with a railing? : Total 6 Click Score: 13    End of Session Equipment Utilized During Treatment: Gait belt Activity Tolerance: Treatment limited secondary to agitation Patient left: in bed;with call bell/phone within reach;with bed alarm set;with family/visitor present;with nursing/sitter in room Nurse Communication: Mobility status PT Visit Diagnosis: Unsteadiness on feet (R26.81);Ataxic gait (R26.0);Muscle weakness (generalized) (M62.81)     Time: 6195-0932 PT Time Calculation (min) (ACUTE ONLY): 12 min  Charges:    $Gait Training: 8-22 mins PT General Charges $$ ACUTE PT VISIT: 1 Visit                    D. Scott  PT, DPT 12/13/22, 4:03 PM

## 2022-12-13 NOTE — TOC Progression Note (Signed)
Transition of Care [facility]) - Progression Note    Patient Details  Name: Clayton [name] MRN: [id number] Date of Birth: April 16, 1935  Transition of [facility] CM/SW Contact  Allena Katz, LCSW Phone Number: 12/13/2022, 1:14 PM  Clinical Narrative:   Recs now for CIR. TOC following for care plan updates.          Expected Discharge Plan and Services                                               Social Determinants of Health (SDOH) Interventions SDOH Screenings   Food Insecurity: No Food Insecurity (12/12/2022)  Housing: Low Risk  (12/12/2022)  Transportation Needs: No Transportation Needs (12/12/2022)  Utilities: Not At Risk (12/12/2022)  Alcohol Screen: Low Risk  (11/10/2022)  Depression (PHQ2-9): Low Risk  (11/10/2022)  Financial Resource Strain: Low Risk  (11/10/2022)  Physical Activity: Inactive (11/10/2022)  Social Connections: Moderately Isolated (11/10/2022)  Stress: No Stress Concern Present (11/10/2022)  Tobacco Use: Medium Risk (12/12/2022)  Health Literacy: Adequate Health Literacy (11/10/2022)    Readmission Risk Interventions     No data to display

## 2022-12-13 NOTE — Evaluation (Signed)
Occupational Therapy Evaluation Patient Details Name: Clayton [name] MRN: [id number] DOB: [birth year] Today's Date: 12/13/2022   History of Present Illness Clayton [name] is a 88 y.o. male with medical history significant of cognitive impairment, history of TBI, hypertension presenting with weakness, confusion. Admitted for worsening weakness and cognitive impairment status post TBI. Per MRI impression: "Scattered small acute to subacute infarcts in the right PCA distribution."   Clinical Impression   Patient presenting with decreased Ind in self care,balance, functional mobility/transfers, endurance, visual disturbances, and safety awareness. Per daughter in room, pt lives at home with spouse and is Ind in self care, IADLS, and works with Training and development officer. Pt seated in recliner chair eating breakfast and noted to only be eating items from midline to R side of tray. Pt only located additional items on L side with full head turn. Decreased coordination in L UE for B hand coordination tasks. Pt ambulates with min HHA with therapist standing on L side as cue for pt to attend to that side. However, pt continues to need mod- max multimodal cuing for safety awareness with ambulation. Pt displaying homonymous hemianopsia and education provided to pt and family about this. Room moved around for family to sit on L side of pt in room. Patient will benefit from acute OT to increase overall independence in the areas of ADLs, functional mobility, and safety awareness in order to safely discharge.      If plan is discharge home, recommend the following: A little help with walking and/or transfers;A lot of help with bathing/dressing/bathroom;Assistance with cooking/housework;Assist for transportation;Help with stairs or ramp for entrance;Direct supervision/assist for financial management;Direct supervision/assist for medications management    Functional Status Assessment  Patient has had a recent decline in their  functional status and demonstrates the ability to make significant improvements in function in a reasonable and predictable amount of time.  Equipment Recommendations  Other (comment) (defer to next venue of care)       Precautions / Restrictions Precautions Precautions: Fall      Mobility Bed Mobility               General bed mobility comments: seated in recliner chair    Transfers Overall transfer level: Needs assistance Equipment used: 1 person hand held assist Transfers: Sit to/from Stand Sit to Stand: Min assist           General transfer comment: contant cuing for directs and to locate items. Pt would be unable to ambulate unattended secondary to visual deficits.      Balance Overall balance assessment: Needs assistance Sitting-balance support: Feet supported, No upper extremity supported Sitting balance-Leahy Scale: Good     Standing balance support: During functional activity, Single extremity supported Standing balance-Leahy Scale: Poor                             ADL either performed or assessed with clinical judgement   ADL Overall ADL's : Needs assistance/impaired                         Toilet Transfer: Minimal assistance                   Vision Baseline Vision/History: 1 Wears glasses Patient Visual Report: No change from baseline Vision Assessment?: Vision impaired- to be further tested in functional context Additional Comments: Pt noted to be expressing homonymous hemianopsia as therapist watches him with  meal tray he only eats from midline to R side and does not see any items on L side of tray without full head tilt            Pertinent Vitals/Pain Pain Assessment Pain Assessment: No/denies pain     Extremity/Trunk Assessment Upper Extremity Assessment Upper Extremity Assessment: Right hand dominant;LUE deficits/detail LUE Deficits / Details: decreased coordinationa nd strength LUE Sensation: WNL LUE  Coordination: decreased fine motor;decreased gross motor           Communication Communication Communication: Hearing impairment Cueing Techniques: Verbal cues;Tactile cues;Visual cues   Cognition Arousal: Alert Behavior During Therapy: WFL for tasks assessed/performed Overall Cognitive Status: History of cognitive impairments - at baseline                                                  Home Living Family/patient expects to be discharged to:: Private residence Living Arrangements: Spouse/significant other Available Help at Discharge: Family;Available 24 hours/day Type of Home: House Home Access: Stairs to enter [employer] of Steps: 8 Entrance Stairs-Rails: Left Home Layout: One level     Bathroom Shower/Tub: Producer, television/film/video: Handicapped height     Home Equipment: Agricultural consultant (2 wheels);Cane - single point;Shower seat - built in          Prior Functioning/Environment Prior Level of Function : Independent/Modified Independent;Working/employed             Mobility Comments: per family he is Ind without AD ADLs Comments: Pt is Ind with self care and IADLs and 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        OT Problem List: Decreased strength;Decreased coordination;Decreased activity tolerance;Decreased safety awareness;Impaired balance (sitting and/or standing);Decreased knowledge of use of DME or AE;Decreased knowledge of precautions;Impaired UE functional use      OT Treatment/Interventions: Self-care/ADL training;Therapeutic exercise;Therapeutic activities;Energy conservation;DME and/or AE instruction;Patient/family education;Balance training;Cognitive remediation/compensation;Visual/perceptual remediation/compensation    OT Goals(Current goals can be found in the care plan section) Acute Rehab OT Goals Patient Stated Goal: to get better OT Goal Formulation: With patient/family Time For Goal  Achievement: 12/27/22 Potential to Achieve Goals: Fair ADL Goals Pt Will Perform Grooming: (P) with supervision;standing Pt Will Perform Upper Body Dressing: (P) with supervision;standing Pt Will Perform Lower Body Dressing: (P) with supervision;sit to/from stand Pt Will Transfer to Toilet: (P) with supervision;ambulating Pt Will Perform Toileting - Clothing Manipulation and hygiene: (P) with supervision;sit to/from stand  OT Frequency: Min 1X/week       AM-PAC OT "6 Clicks" Daily Activity     Outcome Measure Help from another person eating meals?: A Little Help from another person taking care of personal grooming?: A Little Help from another person toileting, which includes using toliet, bedpan, or urinal?: A Lot Help from another person bathing (including washing, rinsing, drying)?: A Lot Help from another person to put on and taking off regular upper body clothing?: A Little Help from another person to put on and taking off regular lower body clothing?: A Lot 6 Click Score: 15   End of Session Nurse Communication: Mobility status  Activity Tolerance: Patient tolerated treatment well Patient left: in bed;with call bell/phone within reach;with bed alarm set  OT Visit Diagnosis: Unsteadiness on feet (R26.81);Muscle weakness (generalized) (M62.81);Ataxia, unspecified (R27.0);Hemiplegia and hemiparesis;Other (comment) (visual disturbance) Hemiplegia - Right/Left: Left  Hemiplegia - dominant/non-dominant: Non-Dominant Hemiplegia - caused by: Cerebral infarction                Time: 1610-9604 OT Time Calculation (min): 22 min Charges:  OT General Charges $OT Visit: 1 Visit OT Evaluation $OT Eval Moderate Complexity: 1 Mod OT Treatments $Self Care/Home Management : 8-22 mins Jackquline Denmark, MS, OTR/L , CBIS ascom [phone]  12/13/22, 12:03 PM

## 2022-12-13 NOTE — Progress Notes (Signed)
Subjective: No significant change  Exam: Vitals:   12/13/22 0554 12/13/22 0811  BP: 139/73 129/70  Pulse: 76 72  Resp: 19 16  Temp: (!) 97.5 F (36.4 C) 98.2 F (36.8 C)  SpO2: 96% 97%   Gen: In bed, NAD Resp: non-labored breathing, no acute distress Abd: soft, nt  Neuro: MS: awake, alert, answers home to location, not able to give month or year.  CN: Left hemianopia, EOMI, face symmetric Motor: possible mild left leg weakness, but he complains of some hip pain which might be limiting it.  Sensory:intact to LT  Pertinent Labs: LDL 98 A1c 6.1  Echo negative CTA - multifocal atherosclerotic disease  Impression: 88 year old gentleman with right PCA stroke.  I suspect that this was thrombotic based on the reconstitution, I suspect this is due to large vessel atherosclerotic disease. He reports intolerance with ASA, but has difficulty characterizing this. With reported rash in chart, will hold off on ASA.   Recommendations: 1) Plavix 75mg  daily 2) atorvastatin 40mg  daily.  3) Can start gently reducing BP to goal of normotension over the next few weeks.  4) I think his cognitive impairments from the strok e are significant, and he may benefit from some degree of rehab at discharge. I worry his wife will have difficulty caring for him at home.  5) Neurology will continue to be available as needed, please call with further questions or concerns.   Ritta Slot, MD [facility] [phone]  If 7pm- 7am, please page neurology on call as listed in AMION.

## 2022-12-13 NOTE — Progress Notes (Signed)
  Progress Note   Patient: Clayton [name] ZOX:[id number] DOB: 07-Aug-1934 DOA: 12/12/2022     0 DOS: the patient was seen and examined on 12/13/2022   Brief hospital course: YUTO DIRIENZO is a 88 y.o. male with medical history significant of cognitive impairment, history of TBI, hypertension presenting with weakness, confusion.  Is seen by neurology, MRI of the brain showed scattered small acute to subacute infarcts in the right PCA distribution.  LDL 98, HDL 39.  A1c 6.1.  Patient is treated with atorvastatin, Plavix.  He previous had reaction to aspirin, could not tolerated.  Decision is made to treat with Plavix for long-term. Echocardiogram was also performed, showed normal ejection fraction without a source of thrombosis. ZIO recorder is placed. However, patient continues to have significant weakness, PT/OT recommend CIR placement.   Principal Problem:   Weakness Active Problems:   Acute metabolic encephalopathy   Cognitive impairment   HTN (hypertension)   Hiatal hernia with GERD   Embolic stroke involving right carotid artery (HCC)   Overweight (BMI 25.0-29.9)   Assessment and Plan: Acute embolic stroke in right PCA distribution. Acute metabolic encephalopathy. Patient had a significant fusion, significant weakness.  Seen by PT/OT, recommended CIR placement. For stroke secondary prevention, patient will be treated with atorvastatin and Plavix as single agent per neurology recommendation.  Patient has worsening mental status since stroke happened.  He also probably has some baseline dementia.  Will add Seroquel to improve sleep.   HTN (hypertension) BP stable Titrate home regimen  Hiatal hernia with GERD PPI  Overweight BMI 38.19.  Diet and excise.     Subjective:  Patient has some confusion,  weakness.  Physical Exam: Vitals:   12/12/22 2043 12/13/22 0011 12/13/22 0554 12/13/22 0811  BP: 139/70 (!) 157/76 139/73 129/70  Pulse: 62 72 76 72  Resp: 18  18 19 16   Temp: (!) 97.3 F (36.3 C) 97.7 F (36.5 C) (!) 97.5 F (36.4 C) 98.2 F (36.8 C)  TempSrc: Oral Oral Oral Oral  SpO2: 97% 94% 96% 97%  Weight:      Height:       General exam: Appears calm and comfortable  Respiratory system: Clear to auscultation. Respiratory effort normal. Cardiovascular system: S1 & S2 heard, RRR. No JVD, murmurs, rubs, gallops or clicks. No pedal edema. Gastrointestinal system: Abdomen is nondistended, soft and nontender. No organomegaly or masses felt. Normal bowel sounds heard. Central nervous system: Alert and oriented x2. No focal neurological deficits. Extremities: Symmetric 5 x 5 power. Skin: No rashes, lesions or ulcers Psychiatry:  Mood & affect appropriate.    Data Reviewed:  Reviewed CT scan, MRI scan, lab results.  Echocardiogram results.  Family Communication: Daughter updated at bedside.  Disposition: Status is: Observation      Time spent: 35 minutes  Author: Marrion Coy, MD 12/13/2022 1:19 PM  For on call review [email or web address].

## 2022-12-13 NOTE — Progress Notes (Signed)
Inpatient Rehab Admissions Coordinator:  ? ?Per therapy recommendations,  patient was screened for CIR candidacy by Laura Staley, MS, CCC-SLP. At this time, Pt. Appears to be a a potential candidate for CIR. I will place   order for rehab consult per protocol for full assessment. Please contact me any with questions. ? ?Laura Staley, MS, CCC-SLP ?Rehab Admissions Coordinator  ?[phone] (celll) ?[phone] (office) ? ?

## 2022-12-14 ENCOUNTER — Encounter: Payer: Self-pay | Admitting: Internal Medicine

## 2022-12-14 DIAGNOSIS — R4189 Other symptoms and signs involving cognitive functions and awareness: Secondary | ICD-10-CM

## 2022-12-14 DIAGNOSIS — I639 Cerebral infarction, unspecified: Secondary | ICD-10-CM

## 2022-12-14 DIAGNOSIS — R531 Weakness: Secondary | ICD-10-CM | POA: Diagnosis not present

## 2022-12-14 DIAGNOSIS — E663 Overweight: Secondary | ICD-10-CM

## 2022-12-14 DIAGNOSIS — R410 Disorientation, unspecified: Secondary | ICD-10-CM | POA: Diagnosis not present

## 2022-12-14 HISTORY — DX: Cerebral infarction, unspecified: I63.9

## 2022-12-14 MED ORDER — ENOXAPARIN SODIUM 40 MG/0.4ML IJ SOSY
40.0000 mg | PREFILLED_SYRINGE | INTRAMUSCULAR | Status: DC
  Administered 2022-12-14 – 2022-12-15 (×2): 40 mg via SUBCUTANEOUS
  Filled 2022-12-14 (×2): qty 0.4

## 2022-12-14 NOTE — Plan of Care (Signed)
  Problem: Clinical Measurements: Goal: Will remain free from infection 12/14/2022 1739 by Alver Fisher, RN Outcome: Progressing 12/14/2022 1739 by Alver Fisher, RN Outcome: Progressing Goal: Diagnostic test results will improve 12/14/2022 1739 by Alver Fisher, RN Outcome: Progressing 12/14/2022 1739 by Tereasa Coop D, RN Outcome: Progressing Goal: Respiratory complications will improve 12/14/2022 1739 by Alver Fisher, RN Outcome: Progressing 12/14/2022 1739 by Alver Fisher, RN Outcome: Progressing   Problem: Activity: Goal: Risk for activity intolerance will decrease 12/14/2022 1739 by Alver Fisher, RN Outcome: Progressing 12/14/2022 1739 by Tereasa Coop D, RN Outcome: Progressing   Problem: Nutrition: Goal: Adequate nutrition will be maintained 12/14/2022 1739 by Alver Fisher, RN Outcome: Progressing 12/14/2022 1739 by Tereasa Coop D, RN Outcome: Progressing   Problem: Coping: Goal: Level of anxiety will decrease 12/14/2022 1739 by Alver Fisher, RN Outcome: Progressing 12/14/2022 1739 by Alver Fisher, RN Outcome: Progressing   Problem: Elimination: Goal: Will not experience complications related to bowel motility 12/14/2022 1739 by Alver Fisher, RN Outcome: Progressing 12/14/2022 1739 by Alver Fisher, RN Outcome: Progressing Goal: Will not experience complications related to urinary retention 12/14/2022 1739 by Alver Fisher, RN Outcome: Progressing 12/14/2022 1739 by Alver Fisher, RN Outcome: Progressing   Problem: Pain Managment: Goal: General experience of comfort will improve 12/14/2022 1739 by Alver Fisher, RN Outcome: Progressing 12/14/2022 1739 by Alver Fisher, RN Outcome: Progressing   Problem: Safety: Goal: Ability to remain free from injury will improve 12/14/2022 1739 by Alver Fisher, RN Outcome: Progressing 12/14/2022 1739 by Tereasa Coop D, RN Outcome: Progressing   Problem: Skin Integrity: Goal: Risk for impaired skin integrity will decrease 12/14/2022 1739 by Alver Fisher, RN Outcome: Progressing 12/14/2022 1739 by Alver Fisher, RN Outcome: Progressing   Problem: Ischemic Stroke/TIA Tissue Perfusion: Goal: Complications of ischemic stroke/TIA will be minimized 12/14/2022 1739 by Alver Fisher, RN Outcome: Progressing 12/14/2022 1739 by Alver Fisher, RN Outcome: Progressing   Problem: Coping: Goal: Will verbalize positive feelings about self 12/14/2022 1739 by Alver Fisher, RN Outcome: Progressing 12/14/2022 1739 by Alver Fisher, RN Outcome: Progressing Goal: Will identify appropriate support needs 12/14/2022 1739 by Alver Fisher, RN Outcome: Progressing 12/14/2022 1739 by Alver Fisher, RN Outcome: Progressing   Problem: Health Behavior/Discharge Planning: Goal: Ability to manage health-related needs will improve 12/14/2022 1739 by Alver Fisher, RN Outcome: Progressing 12/14/2022 1739 by Alver Fisher, RN Outcome: Progressing Goal: Goals will be collaboratively established with patient/family 12/14/2022 1739 by Alver Fisher, RN Outcome: Progressing 12/14/2022 1739 by Tereasa Coop D, RN Outcome: Progressing   Problem: Self-Care: Goal: Ability to participate in self-care as condition permits will improve 12/14/2022 1739 by Alver Fisher, RN Outcome: Progressing 12/14/2022 1739 by Alver Fisher, RN Outcome: Progressing Goal: Verbalization of feelings and concerns over difficulty with self-care will improve 12/14/2022 1739 by Alver Fisher, RN Outcome: Progressing 12/14/2022 1739 by Alver Fisher, RN Outcome: Progressing Goal: Ability to communicate needs accurately will improve 12/14/2022 1739 by Alver Fisher, RN Outcome: Progressing 12/14/2022 1739 by Alver Fisher, RN Outcome:  Progressing   Problem: Nutrition: Goal: Risk of aspiration will decrease 12/14/2022 1739 by Alver Fisher, RN Outcome: Progressing 12/14/2022 1739 by Tereasa Coop D, RN Outcome: Progressing Goal: Dietary intake will improve 12/14/2022 1739 by Alver Fisher, RN Outcome: Progressing 12/14/2022 1739 by Alver Fisher, RN Outcome: Progressing

## 2022-12-14 NOTE — Assessment & Plan Note (Signed)
Improved with Seroquel at night.

## 2022-12-14 NOTE — Plan of Care (Signed)

## 2022-12-14 NOTE — PMR Pre-admission (Signed)
PMR Admission Coordinator Pre-Admission Assessment  Patient: Clayton [name] is an 88 y.o., male MRN: [id number] DOB: [birth year] Height: 5\' 7"  (170.2 cm) Weight: 81.6 kg  Insurance Information HMO: yes     PPO:      PCP:      IPA:      80/20:      OTHER:  PRIMARY: [employer]       Policy#: [id number]      Subscriber:  CM Name: Danella Penton      Phone#: [phone]     Fax#: [phone] Pre-Cert#: [id number] from [place] on 12/15/22       Employer:  Benefits:  Phone #: [phone]     Name: Verified on line benefits Eff Date: 05/02/2022- still active  Deductible: does not have  OOP Max: $3,300 ($699.32 met)  CIR: $125/day co-pay with a max co-pay of $1,250/admission (10 days) SNF: $0/day co-pay for days 1-20, $50/day co-pay for days 21-100, limited to 100 days/cal yr   Outpatient:  $20/visit co-pay  Home Health:  100% coverage  DME: 80% coverage; 20% co-insurance Providers: in network   SECONDARY:       Policy#:      Phone#:   Artist:       Phone#:   The Data processing manager" for patients in Inpatient Rehabilitation Facilities with attached "Privacy Act Statement-Health Care Records" was provided and verbally reviewed with: Patient and Family  Emergency Contact Information Contact Information     Name Relation Home Work Mobile   ROMMELL, MCCUMBERS [phone]      Other Contacts   None on File     Current Medical History  Patient Admitting Diagnosis: R CVA     History of Present Illness:  Clayton [name] is a 88 y.o. male with medical history significant of cognitive impairment, history of TBI, hypertension presenting with weakness, confusion.  Noted admission February 2024 with multifocal pneumonia that resulted in worsening cognitive impairment per the wife.  He was admitted to [facility] 12/12/22 for stroke work up. Pt. Presented to the ER afebrile, hemodynamically  stable.  White count 7, hemoglobin 14.4, platelets 299, COVID flu RSV negative, ammonia level within normal limits, urinalysis not indicative of infection.  CT head within normal limits .  MRI of the brain showed scattered small acute to subacute infarcts in the right PCA distribution.  LDL 98, HDL 39.  A1c 6.1.  Patient is treated with atorvastatin, Plavix.  He previous had reaction to aspirin, could not tolerated.  Decision was made to treat with Plavix for long-term. Echocardiogram was also performed, showed normal ejection fraction without a source of thrombosis. Zio patch to be placed prior to d/c. Pt. Seen by PT,OT, SLP and they recommend CIR to assist return to PLOF.   Complete NIHSS TOTAL: 2  Patient's medical record from [facility] has been reviewed by the rehabilitation admission coordinator and physician.  Past Medical History  Past Medical History:  Diagnosis Date   Acute gastrointestinal hemorrhage    Acute hypoxemic respiratory failure (HCC) 05/25/2022   Acute thrombotic stroke (HCC) 12/14/2022   Basal cell carcinoma    Blood in stool 08/30/2018   Blood transfusion without reported diagnosis 75 (age 22)   After traumatic brain injury   Broken ribs    2 BROKEN ON RIBS   CKD (chronic kidney disease)    PT DENIES   DVT (deep venous thrombosis) (HCC)  RIGHT LEG   Fracture dislocation of wrist    RIGHT   GI bleed 09/02/2018   Hiatal hernia with GERD    HTN (hypertension)    Hypokalemia 05/26/2022   Hyponatremia 05/25/2022   Symptomatic anemia 09/03/2018    Has the patient had major surgery during 100 days prior to admission? No  Family History   family history includes Heart disease in his father.  Current Medications  Current Facility-Administered Medications:    acetaminophen (TYLENOL) tablet 650 mg, 650 mg, Oral, Q4H PRN, Mansy, Jan A, MD, 650 mg at 12/14/22 0001   atorvastatin (LIPITOR) tablet 40 mg, 40 mg, Oral, Daily, Rejeana Brock, MD, 40 mg at 12/15/22 0850   busPIRone (BUSPAR) tablet 7.5 mg, 7.5 mg, Oral, BID, Floydene Flock, MD, 7.5 mg at 12/15/22 0850   [COMPLETED] clopidogrel (PLAVIX) tablet 300 mg, 300 mg, Oral, Once, 300 mg at 12/12/22 2322 **AND** clopidogrel (PLAVIX) tablet 75 mg, 75 mg, Oral, Daily, Rejeana Brock, MD, 75 mg at 12/15/22 0850   enoxaparin (LOVENOX) injection 40 mg, 40 mg, Subcutaneous, Q24H, Wieting, Richard, MD, 40 mg at 12/15/22 0849   haloperidol (HALDOL) tablet 2 mg, 2 mg, Oral, Q6H PRN **OR** haloperidol lactate (HALDOL) injection 2 mg, 2 mg, Intramuscular, Q6H PRN, Marrion Coy, MD, 2 mg at 12/13/22 1442   QUEtiapine (SEROQUEL) tablet 25 mg, 25 mg, Oral, QHS, Marrion Coy, MD, 25 mg at 12/14/22 2107   ziprasidone (GEODON) injection 10 mg, 10 mg, Intramuscular, Q6H PRN, Mansy, Vernetta Honey, MD  Patients Current Diet:  Diet Order             Diet Heart Room service appropriate? Yes with Assist; Fluid consistency: Thin  Diet effective now                   Precautions / Restrictions Precautions Precautions: Fall Restrictions Weight Bearing Restrictions: No   Has the patient had 2 or more falls or a fall with injury in the past year? Yes  Prior Activity Level Community (5-7x/wk): Pt. went to art studio daily  Prior Functional Level Self Care: Did the patient need help bathing, dressing, using the toilet or eating? Independent  Indoor Mobility: Did the patient need assistance with walking from room to room (with or without device)? Independent  Stairs: Did the patient need assistance with internal or external stairs (with or without device)? Independent  Functional Cognition: Did the patient need help planning regular tasks such as shopping or remembering to take medications? Needed some help  Patient Information Are you of Hispanic, Latino/a,or Spanish origin?: A. No, not of Hispanic, Latino/a, or Spanish origin What is your race?: A. White Do you need or  want an interpreter to communicate with a doctor or health care staff?: 0. No  Patient's Response To:  Health Literacy and Transportation Is the patient able to respond to health literacy and transportation needs?: Yes Health Literacy - How often do you need to have someone help you when you read instructions, pamphlets, or other written material from your doctor or pharmacy?: Sometimes In the past 12 months, has lack of transportation kept you from medical appointments or from getting medications?: No In the past 12 months, has lack of transportation kept you from meetings, work, or from getting things needed for daily living?: No  Journalist, newspaper / Equipment Home Assistive Devices/Equipment: Eyeglasses Home Equipment: Agricultural consultant (2 wheels), [employer] - single point, Information systems manager - built in  Prior Device Use:  Indicate devices/aids used by the patient prior to current illness, exacerbation or injury? None of the above  Current Functional Level Cognition  Arousal/Alertness: Awake/alert Overall Cognitive Status: Impaired/Different from baseline Current Attention Level: Focused Orientation Level: Oriented to person, Disoriented to place, Disoriented to time, Disoriented to situation Following Commands: Follows one step commands with increased time (multi modal cueing) Safety/Judgement: Decreased awareness of safety, Decreased awareness of deficits General Comments: Pt much more agitated and confused this session, difficulty following commands Attention: Focused, Sustained Focused Attention: Impaired Focused Attention Impairment: Verbal basic, Functional basic Sustained Attention: Impaired Sustained Attention Impairment: Verbal basic, Functional basic Memory: Impaired (baseline) Awareness: Impaired Awareness Impairment: Anticipatory impairment Problem Solving: Impaired Problem Solving Impairment: Verbal basic, Functional basic Executive Function: Reasoning, Sequencing Reasoning:  Impaired Reasoning Impairment: Verbal basic, Functional basic Sequencing: Impaired Sequencing Impairment: Verbal basic, Functional basic Behaviors: Restless, Impulsive (requires redirection) Safety/Judgment: Impaired Comments: impulsively gets up; difficulty walking around things safely    Extremity Assessment (includes Sensation/Coordination)  Upper Extremity Assessment: Right hand dominant, LUE deficits/detail LUE Deficits / Details: decreased coordinationa nd strength LUE Sensation: WNL LUE Coordination: decreased fine motor, decreased gross motor  Lower Extremity Assessment: Generalized weakness, LLE deficits/detail (Global MMT testing, UE's and LE's are similar bilaterally throughout and [facility] excluding grip strength where R > L) LLE Deficits / Details: variable sensation from knee down on both sides, pt unable to elaborate further just stating sensation felt "very close" to being the same. LLE Sensation: decreased light touch    ADLs  Overall ADL's : Needs assistance/impaired Grooming: Wash/dry hands, Standing, Contact guard assist, Minimal assistance Grooming Details (indicate cue type and reason): frequent vcs for head turn to the L for ADL items in L visual field Lower Body Dressing: Minimal assistance, Sitting/lateral leans Lower Body Dressing Details (indicate cue type and reason): socks Toilet Transfer: Minimal assistance Toilet Transfer Details (indicate cue type and reason): frequent vcs for safety due to visual field deficit Toileting- Clothing Manipulation and Hygiene: Minimal assistance, Sit to/from stand Functional mobility during ADLs: Minimal assistance (hand held assist approx 15' in room two attempts to and from bathroom)    Mobility  Overal bed mobility: Modified Independent General bed mobility comments: NT in recliner pre/post session    Transfers  Overall transfer level: Needs assistance Equipment used: Rolling walker (2 wheels), 1 person hand held  assist Transfers: Sit to/from Stand Sit to Stand: Min assist General transfer comment: Min A for stability    Ambulation / Gait / Stairs / Wheelchair Mobility  Ambulation/Gait Ambulation/Gait assistance: Min assist, Mod assist Gait Distance (Feet): 15 Feet Assistive device: Rolling walker (2 wheels), 1 person hand held assist Gait Pattern/deviations: Drifts right/left, Staggering right, Staggering left, Trunk flexed General Gait Details: Min to mod A for stability this session with pt presenting with difficulty following commands for safe use of RW Gait velocity: decreased    Posture / Balance Balance Overall balance assessment: Needs assistance Sitting-balance support: Feet supported, No upper extremity supported Sitting balance-Leahy Scale: Fair Standing balance support: Single extremity supported, Bilateral upper extremity supported, During functional activity Standing balance-Leahy Scale: Poor Standing balance comment: pt with poor sequencing with use of RW    Special needs/care consideration Special service needs **   Previous Home Environment (from acute therapy documentation) Living Arrangements: Spouse/significant other  Lives With: Spouse Available Help at Discharge: Family Type of Home: House Home Layout: One level Home Access: Stairs to enter Entrance Stairs-Rails: Left Entrance Stairs-Number of Steps: 8 Bathroom Shower/Tub: Walk-in  shower Bathroom Toilet: Handicapped height Bathroom Accessibility: Yes Home Care Services: No  Discharge Living Setting Plans for Discharge Living Setting: Patient's home Type of Home at Discharge: House Discharge Home Layout: One level Discharge Home Access: Stairs to enter Entrance Stairs-Rails: Left Entrance Stairs-Number of Steps: 8 Discharge Bathroom Shower/Tub: Walk-in shower Discharge Bathroom Toilet: Handicapped height Discharge Bathroom Accessibility: Yes How Accessible: Accessible via walker Does the patient have any  problems obtaining your medications?: No  Social/Family/Support Systems Patient Roles: Spouse Contact Information: [phone] Anticipated Caregiver: [name] Ability/Limitations of Caregiver: 24/7 min A Caregiver Availability: 24/7 Discharge Plan Discussed with Primary Caregiver: Yes Is Caregiver In Agreement with Plan?: Yes Does Caregiver/Family have Issues with Lodging/Transportation while Pt is in Rehab?: No  Goals Patient/Family Goal for Rehab: PT/OT/SLP Supervision Expected length of stay: 10-12 days Pt/Family Agrees to Admission and willing to participate: Yes Program Orientation Provided & Reviewed with Pt/Caregiver Including Roles  & Responsibilities: Yes  Decrease burden of Care through IP rehab admission: not anticipated  Possible need for SNF placement upon discharge: not anticipated   Patient Condition: I have reviewed medical records from [facility], spoken with CM, and patient and spouse. I discussed via phone for inpatient rehabilitation assessment.  Patient will benefit from ongoing PT, OT, and SLP, can actively participate in 3 hours of therapy a day 5 days of the week, and can make measurable gains during the admission.  Patient will also benefit from the coordinated team approach during an Inpatient Acute Rehabilitation admission.  The patient will receive intensive therapy as well as Rehabilitation physician, nursing, social worker, and care management interventions.  Due to safety, skin/wound care, disease management, medication administration, pain management, and patient education the patient requires 24 hour a day rehabilitation nursing.  The patient is currently min to mod assist with mobility and basic ADLs.  Discharge setting and therapy post discharge at home with home health is anticipated.  Patient has agreed to participate in the Acute Inpatient Rehabilitation Program and will admit today.  Preadmission Screen Completed By:  Trish Mage,  12/15/2022 10:04 AM ______________________________________________________________________   Discussed status with Dr. Shearon Stalls on 12/15/22 at 0945 and received approval for admission today.  Admission Coordinator:  Trish Mage, RN, time 1005/Date 12/15/22   Assessment/Plan: Diagnosis: Does the need for close, 24 hr/day Medical supervision in concert with the patient's rehab needs make it unreasonable for this patient to be served in a less intensive setting? Yes Co-Morbidities requiring supervision/potential complications: delirium/impulsivity/cognitive impairment, hypertension, intermittent tachycardia, and pain in left hand s/p fall Due to safety, skin/wound care, disease management, medication administration, pain management, and patient education, does the patient require 24 hr/day rehab nursing? Yes Does the patient require coordinated care of a physician, rehab nurse, PT, OT, and SLP to address physical and functional deficits in the context of the above medical diagnosis(es)? Yes Addressing deficits in the following areas: balance, endurance, locomotion, strength, transferring, bathing, dressing, feeding, grooming, toileting, and cognition Can the patient actively participate in an intensive therapy program of at least 3 hrs of therapy 5 days a week? Yes The potential for patient to make measurable gains while on inpatient rehab is good Anticipated functional outcomes upon discharge from inpatient rehab: supervision PT, supervision OT, supervision SLP Estimated rehab length of stay to reach the above functional goals is: 10-12 days Anticipated discharge destination: Home 10. Overall Rehab/Functional Prognosis: good   MD Signature:  Angelina Sheriff, DO 12/15/2022

## 2022-12-14 NOTE — Assessment & Plan Note (Signed)
BMI 28.19.

## 2022-12-14 NOTE — Progress Notes (Signed)
Occupational Therapy Treatment Patient Details Name: Clayton [name] MRN: [id number] DOB: 11/04/1934 Today's Date: 12/14/2022   History of present illness [name] is a 88 y.o. male with medical history significant of cognitive impairment, history of TBI, hypertension presenting with weakness, confusion. Admitted for worsening weakness and cognitive impairment status post TBI. Per MRI impression: "Scattered small acute to subacute infarcts in the right PCA distribution."   OT comments  Chart reviewed, pt greeted in chair with daughter preset, agreeable to OT tx session targeting improved participation in ADL tasks to facilitate return to PLOF. Pt is oriented to self only. Please refer to flowsheet for further details. Pt is requiring MIN A for amb in room with frequent multimodal cueing for safety required for all tasks with decreased awareness to L side. Pt is making progress towards goals, discharge recommendation remains appropriate. OT will follow acutely.       If plan is discharge home, recommend the following:  A little help with walking and/or transfers;A lot of help with bathing/dressing/bathroom;Assistance with cooking/housework;Assist for transportation;Help with stairs or ramp for entrance;Direct supervision/assist for financial management;Direct supervision/assist for medications management   Equipment Recommendations  Other (comment) (per next venue of care)    Recommendations for Other Services      Precautions / Restrictions Precautions Precautions: Fall Restrictions Weight Bearing Restrictions: No       Mobility Bed Mobility               General bed mobility comments: NT in recliner pre/post session    Transfers Overall transfer level: Needs assistance Equipment used: Rolling walker (2 wheels), 1 person hand held assist Transfers: Sit to/from Stand Sit to Stand: Min assist                 Balance Overall balance assessment: Needs  assistance Sitting-balance support: Feet supported, No upper extremity supported Sitting balance-Leahy Scale: Fair     Standing balance support: Single extremity supported, Bilateral upper extremity supported, During functional activity Standing balance-Leahy Scale: Poor Standing balance comment: pt with poor sequencing with use of RW                           ADL either performed or assessed with clinical judgement   ADL Overall ADL's : Needs assistance/impaired     Grooming: Wash/dry hands;Standing;Contact guard assist;Minimal assistance Grooming Details (indicate cue type and reason): frequent vcs for head turn to the L for ADL items in L visual field             Lower Body Dressing: Minimal assistance;Sitting/lateral leans Lower Body Dressing Details (indicate cue type and reason): socks Toilet Transfer: Minimal assistance Toilet Transfer Details (indicate cue type and reason): frequent vcs for safety due to visual field deficit Toileting- Clothing Manipulation and Hygiene: Minimal assistance;Sit to/from stand       Functional mobility during ADLs: Minimal assistance (hand held assist approx 15' in room two attempts to and from bathroom)      Extremity/Trunk Assessment              Vision Baseline Vision/History: 1 Wears glasses Vision Assessment?: Vision impaired- to be further tested in functional context Additional Comments: attempted clock drawing- pt with poor performance throughout drawing a circle and then numbers that are unable to be distingued in top L corner of the cirucle and then the number 6 at bottom of the circle; continued with this pattern after provided a clock  to copy as well. Pt noted to bump into items on his L side during ADL amb   Perception Perception Perception: Impaired Preception Impairment Details: Inattention/Neglect   Praxis      Cognition Arousal: Alert Behavior During Therapy: Flat affect, Impulsive Overall  Cognitive Status: Impaired/Different from baseline Area of Impairment: Following commands, Safety/judgement, Awareness, Orientation, Attention, Memory                 Orientation Level: Disoriented to, Place, Time, Situation ("i'm at home") Current Attention Level: Focused Memory: Decreased short-term memory, Decreased recall of precautions Following Commands: Follows one step commands with increased time (multi modal cueing) Safety/Judgement: Decreased awareness of safety, Decreased awareness of deficits Awareness: Intellectual            Exercises Other Exercises Other Exercises: edu pt and daugthers re: compensation for visual deficits during ADL tasks    Shoulder Instructions       General Comments vss throughout    Pertinent Vitals/ Pain       Pain Assessment Pain Assessment: No/denies pain  Home Living                                          Prior Functioning/Environment              Frequency  Min 1X/week        Progress Toward Goals  OT Goals(current goals can now be found in the care plan section)  Progress towards OT goals: Progressing toward goals     Plan      Co-evaluation                 AM-PAC OT "6 Clicks" Daily Activity     Outcome Measure   Help from another person eating meals?: A Little Help from another person taking care of personal grooming?: A Little Help from another person toileting, which includes using toliet, bedpan, or urinal?: A Little Help from another person bathing (including washing, rinsing, drying)?: A Lot Help from another person to put on and taking off regular upper body clothing?: A Little Help from another person to put on and taking off regular lower body clothing?: A Lot 6 Click Score: 16    End of Session Equipment Utilized During Treatment: Gait belt  OT Visit Diagnosis: Unsteadiness on feet (R26.81);Muscle weakness (generalized) (M62.81);Ataxia, unspecified  (R27.0);Hemiplegia and hemiparesis;Other (comment) Hemiplegia - Right/Left: Left Hemiplegia - dominant/non-dominant: Non-Dominant Hemiplegia - caused by: Cerebral infarction   Activity Tolerance Patient tolerated treatment well   Patient Left in chair;with call bell/phone within reach;with chair alarm set;with family/visitor present;with nursing/sitter in room   Nurse Communication          Time: 1342-1400 OT Time Calculation (min): 18 min  Charges: OT General Charges $OT Visit: 1 Visit OT Treatments $Self Care/Home Management : 8-22 mins  Oleta Mouse, OTD OTR/L  12/14/22, 2:16 PM

## 2022-12-14 NOTE — Progress Notes (Signed)
Clayton [name] is an 88 year old man with a history of cognitive impairment, history of TBI, HTN, who presented with weakness, confusion. Admitted with worsening weakness and cognitive impairment s/p TBI. MRI shows scattered small acute to subacute infarcts in the right PCA distribution.   Patient is currently requiring min to Mod A for stability with ambulation and has difficulty with following commands. He is able to ambulate min to Mod A 15 feet.  He would be an excellent CIR candidate as requires intensive rehab to reach a supervision level in mobility and ADLs, he requires monitoring of his blood pressure, monitoring of labs, and continued management of his impulsivity, agitation, and confusion.

## 2022-12-14 NOTE — Assessment & Plan Note (Addendum)
Right PCA distribution.  Acute thrombotic stroke as per neurology.  Neurology recommends Plavix and atorvastatin.  Looking into acute inpatient rehab.

## 2022-12-14 NOTE — Progress Notes (Signed)
Inpatient Rehab Admissions Coordinator:    I spoke with pt. And wife regarding potential CIR admit. They are interested and wife can provide 24/7 min A at d/c. I will open a case with insurance and pursue for admit.   Megan Salon, MS, CCC-SLP Rehab Admissions Coordinator  [phone] (celll) [phone] (office)

## 2022-12-14 NOTE — Progress Notes (Signed)
Progress Note   Patient: Clayton [name] EXB:[id number] DOB: 05-May-1934 DOA: 12/12/2022     1 DOS: the patient was seen and examined on 12/14/2022   Brief hospital course: Clayton [name] is a 88 y.o. male with medical history significant of cognitive impairment, history of TBI, hypertension presenting with weakness, confusion.  Is seen by neurology, MRI of the brain showed scattered small acute to subacute infarcts in the right PCA distribution.  LDL 98, HDL 39.  A1c 6.1.  Patient is treated with atorvastatin, Plavix.  He previous had reaction to aspirin, could not tolerated.  Decision is made to treat with Plavix for long-term. Echocardiogram was also performed, showed normal ejection fraction without a source of thrombosis. ZIO recorder is placed. However, patient continues to have significant weakness, PT/OT recommend CIR placement.  Assessment and Plan: * Acute thrombotic stroke (HCC) Right PCA distribution.  Acute thrombotic stroke as per neurology.  Neurology recommends Plavix and atorvastatin.  Looking into acute inpatient rehab.  Acute delirium Improved with Seroquel at night.  Weakness PT recommending acute inpatient rehab   Cognitive impairment Baseline cognitive impairment in setting of history of TBI     HTN (hypertension) Blood pressure stable.  Not on any blood pressure medication.  Overweight (BMI 25.0-29.9) BMI 28.19.        Subjective: Patient feels okay.  Offers no complaints.  States he slept well.  Admitted with acute and subacute strokes.  Interested in acute inpatient rehab.  Physical Exam: Vitals:   12/13/22 1615 12/13/22 2025 12/14/22 0848 12/14/22 1141  BP: (!) 143/76 (!) 151/76 129/70 111/74  Pulse: 71 80 74 70  Resp: (!) 21 19 16 16   Temp: 98 F (36.7 C) 98.1 F (36.7 C) 97.7 F (36.5 C) 97.8 F (36.6 C)  TempSrc: Oral  Oral Oral  SpO2: 98% 96% 99% 99%  Weight:      Height:       Physical Exam HENT:     Head:  Normocephalic.     Mouth/Throat:     Pharynx: No oropharyngeal exudate.  Eyes:     General: Lids are normal.     Conjunctiva/sclera: Conjunctivae normal.  Cardiovascular:     Rate and Rhythm: Normal rate and regular rhythm.     Heart sounds: Normal heart sounds, S1 normal and S2 normal.  Pulmonary:     Breath sounds: No decreased breath sounds, wheezing, rhonchi or rales.  Abdominal:     Palpations: Abdomen is soft.     Tenderness: There is no abdominal tenderness.  Musculoskeletal:     Right lower leg: No swelling.     Left lower leg: No swelling.  Skin:    General: Skin is warm.     Findings: No rash.  Neurological:     Mental Status: He is alert.     Comments: Answer simple questions appropriately.  Able to move all of the extremities to command.     Data Reviewed: LDL 98 MRI of the brain showed scattered small acute to subacute infarcts in the right PCA distribution CT angio shows abrupt occlusion of the right PCA in the P1 segment more distal branches show reconstituted flow, 50% stenosis left vertebral artery, 30 to 50% stenosis right vertebral artery  Family Communication: Spoke with wife at the bedside  Disposition: Status is: Inpatient Remains inpatient appropriate because: Awaiting insurance authorization for acute inpatient rehab and bed availability  Planned Discharge Destination: Acute inpatient rehab    Time spent: 28 minutes  Author: Alford Highland, MD 12/14/2022 3:01 PM  For on call review [email or web address].

## 2022-12-15 ENCOUNTER — Encounter: Payer: Self-pay | Admitting: Internal Medicine

## 2022-12-15 ENCOUNTER — Inpatient Hospital Stay (HOSPITAL_COMMUNITY)
Admission: RE | Admit: 2022-12-15 | Discharge: 2022-12-29 | DRG: 057 | Disposition: A | Discharge status: Home Health | Payer: Medicare PPO | Source: Other Acute Inpatient Hospital | Attending: Physical Medicine & Rehabilitation | Admitting: Physical Medicine & Rehabilitation

## 2022-12-15 ENCOUNTER — Encounter (HOSPITAL_COMMUNITY): Payer: Self-pay | Admitting: Physical Medicine & Rehabilitation

## 2022-12-15 ENCOUNTER — Other Ambulatory Visit: Payer: Self-pay

## 2022-12-15 DIAGNOSIS — Z886 Allergy status to analgesic agent status: Secondary | ICD-10-CM | POA: Diagnosis not present

## 2022-12-15 DIAGNOSIS — F0390 Unspecified dementia without behavioral disturbance: Secondary | ICD-10-CM | POA: Diagnosis present

## 2022-12-15 DIAGNOSIS — Z882 Allergy status to sulfonamides status: Secondary | ICD-10-CM

## 2022-12-15 DIAGNOSIS — I6389 Other cerebral infarction: Secondary | ICD-10-CM

## 2022-12-15 DIAGNOSIS — K219 Gastro-esophageal reflux disease without esophagitis: Secondary | ICD-10-CM | POA: Insufficient documentation

## 2022-12-15 DIAGNOSIS — Z87891 Personal history of nicotine dependence: Secondary | ICD-10-CM | POA: Diagnosis not present

## 2022-12-15 DIAGNOSIS — G4700 Insomnia, unspecified: Secondary | ICD-10-CM | POA: Diagnosis not present

## 2022-12-15 DIAGNOSIS — R296 Repeated falls: Secondary | ICD-10-CM | POA: Diagnosis present

## 2022-12-15 DIAGNOSIS — Z8249 Family history of ischemic heart disease and other diseases of the circulatory system: Secondary | ICD-10-CM

## 2022-12-15 DIAGNOSIS — F411 Generalized anxiety disorder: Secondary | ICD-10-CM | POA: Diagnosis present

## 2022-12-15 DIAGNOSIS — Z85828 Personal history of other malignant neoplasm of skin: Secondary | ICD-10-CM

## 2022-12-15 DIAGNOSIS — R4189 Other symptoms and signs involving cognitive functions and awareness: Secondary | ICD-10-CM | POA: Diagnosis not present

## 2022-12-15 DIAGNOSIS — Z79899 Other long term (current) drug therapy: Secondary | ICD-10-CM

## 2022-12-15 DIAGNOSIS — K449 Diaphragmatic hernia without obstruction or gangrene: Secondary | ICD-10-CM

## 2022-12-15 DIAGNOSIS — I639 Cerebral infarction, unspecified: Secondary | ICD-10-CM | POA: Diagnosis present

## 2022-12-15 DIAGNOSIS — I10 Essential (primary) hypertension: Secondary | ICD-10-CM | POA: Diagnosis not present

## 2022-12-15 DIAGNOSIS — I63411 Cerebral infarction due to embolism of right middle cerebral artery: Secondary | ICD-10-CM | POA: Diagnosis not present

## 2022-12-15 DIAGNOSIS — R410 Disorientation, unspecified: Secondary | ICD-10-CM | POA: Diagnosis not present

## 2022-12-15 DIAGNOSIS — H53462 Homonymous bilateral field defects, left side: Secondary | ICD-10-CM | POA: Diagnosis not present

## 2022-12-15 DIAGNOSIS — Z8719 Personal history of other diseases of the digestive system: Secondary | ICD-10-CM

## 2022-12-15 DIAGNOSIS — G4701 Insomnia due to medical condition: Secondary | ICD-10-CM | POA: Diagnosis present

## 2022-12-15 DIAGNOSIS — R414 Neurologic neglect syndrome: Secondary | ICD-10-CM | POA: Diagnosis present

## 2022-12-15 DIAGNOSIS — I358 Other nonrheumatic aortic valve disorders: Secondary | ICD-10-CM | POA: Diagnosis not present

## 2022-12-15 DIAGNOSIS — R531 Weakness: Secondary | ICD-10-CM | POA: Diagnosis not present

## 2022-12-15 DIAGNOSIS — I69398 Other sequelae of cerebral infarction: Principal | ICD-10-CM

## 2022-12-15 DIAGNOSIS — N179 Acute kidney failure, unspecified: Secondary | ICD-10-CM | POA: Insufficient documentation

## 2022-12-15 DIAGNOSIS — I63331 Cerebral infarction due to thrombosis of right posterior cerebral artery: Secondary | ICD-10-CM | POA: Diagnosis not present

## 2022-12-15 DIAGNOSIS — Z86718 Personal history of other venous thrombosis and embolism: Secondary | ICD-10-CM

## 2022-12-15 DIAGNOSIS — G629 Polyneuropathy, unspecified: Secondary | ICD-10-CM | POA: Diagnosis present

## 2022-12-15 DIAGNOSIS — F05 Delirium due to known physiological condition: Secondary | ICD-10-CM | POA: Diagnosis not present

## 2022-12-15 DIAGNOSIS — R480 Dyslexia and alexia: Secondary | ICD-10-CM | POA: Diagnosis present

## 2022-12-15 DIAGNOSIS — K922 Gastrointestinal hemorrhage, unspecified: Secondary | ICD-10-CM | POA: Diagnosis not present

## 2022-12-15 DIAGNOSIS — E7800 Pure hypercholesterolemia, unspecified: Secondary | ICD-10-CM | POA: Insufficient documentation

## 2022-12-15 DIAGNOSIS — E663 Overweight: Secondary | ICD-10-CM | POA: Diagnosis not present

## 2022-12-15 DIAGNOSIS — R7989 Other specified abnormal findings of blood chemistry: Secondary | ICD-10-CM | POA: Diagnosis not present

## 2022-12-15 MED ORDER — BISACODYL 10 MG RE SUPP: 10.0000 mg | Freq: Every day | RECTAL | Status: DC | PRN

## 2022-12-15 MED ORDER — VITAMIN B-12 500 MCG PO TABS
500.0000 ug | ORAL_TABLET | Freq: Every day | ORAL | Status: AC
Start: 2022-12-15 — End: ?

## 2022-12-15 MED ORDER — OLANZAPINE 10 MG IM SOLR: 5.0000 mg | INTRAMUSCULAR | Status: DC | PRN

## 2022-12-15 MED ORDER — QUETIAPINE FUMARATE 25 MG PO TABS: 25.0000 mg | ORAL_TABLET | Freq: Every evening | ORAL | 0 refills | Status: DC | PRN

## 2022-12-15 MED ORDER — GABAPENTIN 100 MG PO CAPS: 100.0000 mg | ORAL_CAPSULE | Freq: Every day | ORAL | 0 refills | Status: DC

## 2022-12-15 MED ORDER — PROCHLORPERAZINE 25 MG RE SUPP: 12.5000 mg | Freq: Four times a day (QID) | RECTAL | Status: DC | PRN

## 2022-12-15 MED ORDER — CLOPIDOGREL BISULFATE 75 MG PO TABS
75.0000 mg | ORAL_TABLET | Freq: Every day | ORAL | Status: DC
  Administered 2022-12-16 – 2022-12-29 (×14): 75 mg via ORAL
  Filled 2022-12-15 (×14): qty 1

## 2022-12-15 MED ORDER — DIPHENHYDRAMINE HCL 25 MG PO CAPS
25.0000 mg | ORAL_CAPSULE | Freq: Four times a day (QID) | ORAL | Status: DC | PRN
  Administered 2022-12-25: 25 mg via ORAL
  Filled 2022-12-15: qty 1

## 2022-12-15 MED ORDER — ATORVASTATIN CALCIUM 40 MG PO TABS
40.0000 mg | ORAL_TABLET | Freq: Every day | ORAL | Status: DC
  Administered 2022-12-16 – 2022-12-29 (×14): 40 mg via ORAL
  Filled 2022-12-15 (×12): qty 1
  Filled 2022-12-15: qty 4
  Filled 2022-12-15: qty 1

## 2022-12-15 MED ORDER — ATORVASTATIN CALCIUM 40 MG PO TABS: 40.0000 mg | ORAL_TABLET | Freq: Every day | ORAL | 0 refills | Status: DC

## 2022-12-15 MED ORDER — BUSPIRONE HCL 15 MG PO TABS
7.5000 mg | ORAL_TABLET | Freq: Two times a day (BID) | ORAL | Status: DC
  Administered 2022-12-15 – 2022-12-29 (×28): 7.5 mg via ORAL
  Filled 2022-12-15 (×28): qty 1

## 2022-12-15 MED ORDER — OLANZAPINE 5 MG PO TBDP
5.0000 mg | ORAL_TABLET | ORAL | Status: DC | PRN
  Administered 2022-12-17 – 2022-12-18 (×2): 5 mg via ORAL
  Filled 2022-12-15 (×3): qty 1

## 2022-12-15 MED ORDER — GUAIFENESIN-DM 100-10 MG/5ML PO SYRP: 5.0000 mL | ORAL_SOLUTION | Freq: Four times a day (QID) | ORAL | Status: DC | PRN

## 2022-12-15 MED ORDER — ACETAMINOPHEN 325 MG PO TABS
325.0000 mg | ORAL_TABLET | ORAL | Status: DC | PRN
  Administered 2022-12-15: 650 mg via ORAL
  Filled 2022-12-15: qty 2

## 2022-12-15 MED ORDER — PROCHLORPERAZINE EDISYLATE 10 MG/2ML IJ SOLN: 5.0000 mg | Freq: Four times a day (QID) | INTRAMUSCULAR | Status: DC | PRN

## 2022-12-15 MED ORDER — FLEET ENEMA RE ENEM: 1.0000 | ENEMA | Freq: Once | RECTAL | Status: DC | PRN

## 2022-12-15 MED ORDER — PROCHLORPERAZINE MALEATE 5 MG PO TABS: 5.0000 mg | ORAL_TABLET | Freq: Four times a day (QID) | ORAL | Status: DC | PRN

## 2022-12-15 MED ORDER — QUETIAPINE FUMARATE 25 MG PO TABS
25.0000 mg | ORAL_TABLET | Freq: Every day | ORAL | Status: DC
  Administered 2022-12-15 – 2022-12-18 (×4): 25 mg via ORAL
  Filled 2022-12-15 (×4): qty 1

## 2022-12-15 MED ORDER — CLOPIDOGREL BISULFATE 75 MG PO TABS: 75.0000 mg | ORAL_TABLET | Freq: Every day | ORAL | 0 refills | Status: DC

## 2022-12-15 MED ORDER — PANTOPRAZOLE SODIUM 40 MG PO TBEC
40.0000 mg | DELAYED_RELEASE_TABLET | Freq: Every day | ORAL | Status: DC
  Administered 2022-12-16 – 2022-12-29 (×14): 40 mg via ORAL
  Filled 2022-12-15 (×14): qty 1

## 2022-12-15 MED ORDER — ENOXAPARIN SODIUM 40 MG/0.4ML IJ SOSY
40.0000 mg | PREFILLED_SYRINGE | INTRAMUSCULAR | Status: DC
  Administered 2022-12-15 – 2022-12-28 (×14): 40 mg via SUBCUTANEOUS
  Filled 2022-12-15 (×14): qty 0.4

## 2022-12-15 MED ORDER — ALUM & MAG HYDROXIDE-SIMETH 200-200-20 MG/5ML PO SUSP: 30.0000 mL | ORAL | Status: DC | PRN

## 2022-12-15 NOTE — Progress Notes (Signed)
[facility] rehab admissions - We have authorization for inpatient rehab admission for today.  We have a bed on CIR and will admit today.  Please have nurse call report to [phone].  Patient will admit to [address] and Dr. Shearon Stalls will accept the admission.  Call me for questions.  [phone]

## 2022-12-15 NOTE — H&P (Signed)
Physical Medicine and Rehabilitation Admission H&P    Chief Complaint  Patient presents with   Functional deficits due to stroke    HPI:  Clayton [name] is an 88 year old male with history of GIB, RLE-DVT, HTN in the past?, TBI w/skull Fx/crani  '61, cognitive impairment; who was admitted on 12/12/22 to [facility] with reports of weakness, tendency to bump into things and confusion X one day as well as reports of multiple falls X a week. CTA head showed abrupt occlusion R-PCA in P1 segment with atherosclerotic disease bilateral carotid bifurcations, R-VA 30-50% stenosis and 50% stenosis L-VA as well as 30% stenosis left proximal SCA.  MRI brain showed small acute to subacute infarcts in R-PCA and generalized brain atrophy. 2D echo showed EF 55-60% with mild calcification of aortic valve.  Dr. Amada Jupiter felt that stroke was thrombotic and recommended Plavix for secondary stroke prevention.  He has had issues with agitation and Seroquel was added to help improve sleep. Progress note from 08/13 reports that Zio patch was placed by ???.  Wife reports that patient has pulled that off multiple times and it was off this am. Therapy evaluations done revealing L-HH with left inattention and tendency to bump into items on the left, decreased coordination LUE, difficulty following commands, cognitive deficits,balance deficits and poor safety awareness. CIR recommended due to functional decline.     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    Review of Systems  Constitutional:  Negative for chills and fever.  HENT:  Positive for hearing loss.   Eyes:  Negative for blurred vision and double vision.  Respiratory:  Negative for cough.   Cardiovascular:  Negative for chest pain.  Gastrointestinal:  Negative for constipation, heartburn and nausea.  Genitourinary:  Negative for dysuria and  urgency.  Musculoskeletal:  Positive for falls (multiple significant fall w/concussion past 2-3 years). Negative for myalgias.  Neurological:  Positive for sensory change (sensory deficits BLE from feet to knees), speech change and weakness.  Psychiatric/Behavioral:  The patient has insomnia (gets up about 4 times a night due to his legs).      Past Medical History:  Diagnosis Date   Acute gastrointestinal hemorrhage    Acute hypoxemic respiratory failure (HCC) 05/25/2022   Acute thrombotic stroke (HCC) 12/14/2022   Basal cell carcinoma    Blood in stool 08/30/2018   Blood transfusion without reported diagnosis 6 (age 60)   After traumatic brain injury   Broken ribs    2 BROKEN ON RIBS   CKD (chronic kidney disease)    PT DENIES   Color blindness    DVT (deep venous thrombosis) (HCC)    RIGHT LEG   Dyslexia    Fracture dislocation of wrist    RIGHT   GI bleed 09/02/2018   Hiatal hernia with GERD    HTN (hypertension)    Hypokalemia 05/26/2022   Hyponatremia 05/25/2022   Symptomatic anemia 09/03/2018   TBI (traumatic brain injury) (HCC)    w/skull Fx and crani in 1960s    Past Surgical History:  Procedure Laterality Date   COLONOSCOPY  2014   cleared for 5 yrs   COLONOSCOPY N/A 09/01/2018   Procedure: COLONOSCOPY;  Surgeon: Pasty Spillers, MD;  Location: [facility] ENDOSCOPY;  Service: Endoscopy;  Laterality: N/A;   CRANIECTOMY FOR DEPRESSED SKULL FRACTURE  1960'S   ESOPHAGOGASTRODUODENOSCOPY N/A 09/01/2018  Procedure: ESOPHAGOGASTRODUODENOSCOPY (EGD);  Surgeon: Pasty Spillers, MD;  Location: [facility] ENDOSCOPY;  Service: Endoscopy;  Laterality: N/A;   HERNIA REPAIR     X2   IVC FILTER REMOVAL N/A 07/03/2017   Procedure: IVC FILTER REMOVAL;  Surgeon: Annice Needy, MD;  Location: [facility] INVASIVE CV LAB;  Service: Cardiovascular;  Laterality: N/A;   LUMBAR LAMINECTOMY/DECOMPRESSION MICRODISCECTOMY N/A 04/20/2020   Procedure: L2/3, L3/4 LAMINECTOMY;  Surgeon: Lucy Chris, MD;  Location: [facility] ORS;  Service: Neurosurgery;  Laterality: N/A;  2ND CASE   PERIPHERAL VASCULAR THROMBECTOMY Right 05/22/2017   Procedure: PERIPHERAL VASCULAR THROMBECTOMY;  Surgeon: Annice Needy, MD;  Location: [facility] INVASIVE CV LAB;  Service: Cardiovascular;  Laterality: Right;   SPINE SURGERY  December 2021   TONSILLECTOMY      Family History  Problem Relation Age of Onset   Heart disease Father     Social History:  Married X 30 years. Has three children and lives with wife and works in his shop 7 days a week. He reports that he quit smoking about 63 years ago. His smoking use included cigarettes. He started smoking about 67 years ago. He has a 1.7 pack-year smoking history. He has never used smokeless tobacco. He reports current alcohol use of about 3.0 standard drinks of alcohol per week. He reports that he does not use drugs.   Allergies  Allergen Reactions   Aspirin Rash   Sulfa Antibiotics Rash    Medications Prior to Admission  Medication Sig Dispense Refill   acetaminophen (TYLENOL) 500 MG tablet Take 1,000 mg by mouth every 6 (six) hours as needed for mild pain or moderate pain.      busPIRone (BUSPAR) 7.5 MG tablet Take by mouth.     Cetirizine HCl (ZYRTEC PO) Take by mouth.     pantoprazole (PROTONIX) 40 MG tablet Take 1 tablet (40 mg total) by mouth daily. 90 tablet 1   Pediatric Multivitamins-Fl (MULTI VIT/FL PO) Take by mouth.     [DISCONTINUED] Cyanocobalamin (VITAMIN B-12 PO) Take by mouth.     [DISCONTINUED] gabapentin (NEURONTIN) 100 MG capsule Take gabapentin 100 mg nightly for 1 week, then increase to 200 mg nightly and continue this dose.        Home: Home Living Family/patient expects to be discharged to:: Private residence Living Arrangements: Spouse/significant other Available Help at Discharge: Family Type of Home: House Home Access: Stairs to enter Secretary/administrator of Steps: 8 Entrance Stairs-Rails: Left Home Layout: One  level Bathroom Shower/Tub: Health visitor: Handicapped height Bathroom Accessibility: Yes Home Equipment: Agricultural consultant (2 wheels), [employer] - single point, Information systems manager - built in  Lives With: Spouse   Functional History: Prior Function Prior Level of Function : Independent/Modified Independent, Working/employed Mobility Comments: per family he is Ind without AD ADLs Comments: Pt is Ind with self care and IADLs and 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   Functional Status:  Mobility: Bed Mobility Overal bed mobility: Modified Independent General bed mobility comments: NT in recliner pre/post session Transfers Overall transfer level: Needs assistance Equipment used: Rolling walker (2 wheels), 1 person hand held assist Transfers: Sit to/from Stand Sit to Stand: Min assist General transfer comment: Min A for stability Ambulation/Gait Ambulation/Gait assistance: Min assist, Mod assist Gait Distance (Feet): 15 Feet Assistive device: Rolling walker (2 wheels), 1 person hand held assist Gait Pattern/deviations: Drifts right/left, Staggering right, Staggering left, Trunk flexed General Gait Details: Min to mod A for stability  this session with pt presenting with difficulty following commands for safe use of RW Gait velocity: decreased   ADL: ADL Overall ADL's : Needs assistance/impaired Grooming: Wash/dry hands, Standing, Contact guard assist, Minimal assistance Grooming Details (indicate cue type and reason): frequent vcs for head turn to the L for ADL items in L visual field Lower Body Dressing: Minimal assistance, Sitting/lateral leans Lower Body Dressing Details (indicate cue type and reason): socks Toilet Transfer: Minimal assistance Toilet Transfer Details (indicate cue type and reason): frequent vcs for safety due to visual field deficit Toileting- Clothing Manipulation and Hygiene: Minimal assistance, Sit to/from stand Functional mobility during  ADLs: Minimal assistance (hand held assist approx 15' in room two attempts to and from bathroom)   Cognition: Cognition Overall Cognitive Status: Impaired/Different from baseline Arousal/Alertness: Awake/alert Orientation Level: Oriented to person, Disoriented to place, Disoriented to time, Disoriented to situation Attention: Focused, Sustained Focused Attention: Impaired Focused Attention Impairment: Verbal basic, Functional basic Sustained Attention: Impaired Sustained Attention Impairment: Verbal basic, Functional basic Memory: Impaired (baseline) Awareness: Impaired Awareness Impairment: Anticipatory impairment Problem Solving: Impaired Problem Solving Impairment: Verbal basic, Functional basic Executive Function: Reasoning, Sequencing Reasoning: Impaired Reasoning Impairment: Verbal basic, Functional basic Sequencing: Impaired Sequencing Impairment: Verbal basic, Functional basic Behaviors: Restless, Impulsive (requires redirection) Safety/Judgment: Impaired Comments: impulsively gets up; difficulty walking around things safely Cognition Arousal: Alert Behavior During Therapy: Flat affect, Impulsive Overall Cognitive Status: Impaired/Different from baseline Area of Impairment: Following commands, Safety/judgement, Awareness, Orientation, Attention, Memory Orientation Level: Disoriented to, Place, Time, Situation ("i'm at home") Current Attention Level: Focused Memory: Decreased short-term memory, Decreased recall of precautions Following Commands: Follows one step commands with increased time (multi modal cueing) Safety/Judgement: Decreased awareness of safety, Decreased awareness of deficits Awareness: Intellectual General Comments: Pt much more agitated and confused this session, difficulty following commands     Blood pressure 114/76, pulse 85, temperature 98.3 F (36.8 C), temperature source Oral, resp. rate 16, height 5\' 7"  (1.702 m), SpO2 96%. Constitutional: No  apparent distress. Appropriate appearance for age.  HENT: No JVD. Neck Supple. Trachea midline. Atraumatic, normocephalic. +HOH  Eyes: PERRLA. EOMI. Visual fields grossly intact. +glasses +Nystagmus with right gaze Cardiovascular: RRR, no murmurs/rub/gallops. No Edema. Peripheral pulses 2+  Respiratory: CTAB. No rales, rhonchi, or wheezing. On RA.  Abdomen: + bowel sounds, normoactive. No distention or tenderness.  Skin: C/D/I. No apparent lesions. LUE IV intact.  + Skin tear L elbow - clean, dry, open to air  MSK:      No apparent deformity.       Neurologic exam:  Cognition: AAO to person, place, year with cues; unable to get month or day.  + Dyslexia, cannot spell backward, baseline per wife Can add change Intact to abstraction, repetition, following all commands  Language: Fluent, No substitutions or neoglisms. No dysarthria. Names 3/3 objects correctly.  Memory: Recalls 0/3 objects at 5 minutes.   Insight: Poor insight into current condition.  Mood: Flat affect, appropriate mood.  Sensation: Reduced in LLE > LUE; normal on R Reflexes: 2+ in BL UE and LEs. Negative Hoffman's and babinski signs bilaterally.  CN: + R facial droop + L V1-3 deficit + L shoulder shrug weakness Coordination: + ataxia on FTN LUE; fine motor LUE reduced Spasticity: MAS 0 in all extremities.  Strength:                RUE: 5/5 SA, 5/5 EF, 5/5 EE, 5/5 WE, 5/5 FF, 5/5 FA  LUE: 4+/5 SA, 4+/5 EF, 4+/5 EE, 4+/5 WE, 5-/5 FF, 5-/5 FA                 RLE: 5/5 HF, 5/5 KE, 5/5 DF, 5/5 EHL, 5/5 PF                 LLE:  5-/5 HF, 5/5 KE, 5/5 DF, 5/5 EHL, 5/5 PF      No results found for this or any previous visit (from the past 48 hour(s)). No results found.    Blood pressure 114/76, pulse 85, temperature 98.3 F (36.8 C), temperature source Oral, resp. rate 16, height 5\' 7"  (1.702 m), SpO2 96%.  Medical Problem List and Plan: 1. Functional deficits secondary to R PCA thrombotic  CVA  -patient may shower  -ELOS/Goals: 10-12 days, SPV PT/OT/SLP 2.  Antithrombotics: -DVT/anticoagulation:  Pharmaceutical: Lovenox  -antiplatelet therapy: Plavix 3. Pain Management:  Tylenol prn. 4. Mood/Behavior/Sleep:  LCSW to follow for evaluation and support.   -antipsychotic agents: N/A  -Seroquel for insomnia  5. Neuropsych/cognition: This patient is not capable of making decisions on his own behalf.   - Delirium precautions, telesitter and bed alarm, utilize aides for orientation (hearing aides, glasses)  6. Skin/Wound Care: Routine pressure relief measures.  7. Fluids/Electrolytes/Nutrition: Monitor I/O. Check CMET in am.  8. HTN: Monitor BP TID--not on any meds PTA. 9. H/o GIB X 2: Will resume Protonix and monitor stool for occult blood.  --monitor H/H 10. Anxiety d/o: Continue Buspar bid   11. Cardiac monitoring: Has Zio patch/event monitor. Wife with multiple questions QI:ONGE,XBM, care.   --No cardiology notes noted--reached out to Dr. Glennis Brink office for input.  --Wait on monitor use till delirium resolves as patient continues to pull it off?    Jacquelynn Cree, PA-C 12/15/2022   I have examined the patient independently and edited the note for HPI, ROS, exam, assessment, and plan as appropriate. I am in agreement with the above recommendations.   Angelina Sheriff, DO 12/15/2022    Angelina Sheriff, DO 12/15/2022

## 2022-12-15 NOTE — Progress Notes (Signed)
PMR Admission Coordinator Pre-Admission Assessment   Patient: Clayton [name] is an 88 y.o., male MRN: [id number] DOB: 11/23/1934 Height: 5\' 7"  (170.2 cm) Weight: 81.6 kg   Insurance Information HMO: yes     PPO:      PCP:      IPA:      80/20:      OTHER:  PRIMARY: [employer]       Policy#: [id number]      Subscriber:  CM Name: Baird Lyons      Phone#: [phone]    Fax#: [phone] Pre-Cert#: [id number] from [place] on 12/15/22 for 7 days with updates to Danella Penton at [phone] and fax [phone]       Employer: None Benefits:  Phone #: [phone]     Name: Verified on line benefits Eff Date: 05/02/2022- still active  Deductible: does not have  OOP Max: $3,300 ($699.32 met)  CIR: $125/day co-pay with a max co-pay of $1,250/admission (10 days) SNF: $0/day co-pay for days 1-20, $50/day co-pay for days 21-100, limited to 100 days/cal yr   Outpatient:  $20/visit co-pay  Home Health:  100% coverage  DME: 80% coverage; 20% co-insurance Providers: in network    SECONDARY:       Policy#:      Phone#:    Artist:       Phone#:    The Data processing manager" for patients in Inpatient Rehabilitation Facilities with attached "Privacy Act Statement-Health Care Records" was provided and verbally reviewed with: Patient and Family   Emergency Contact Information Contact Information       Name Relation Home Work Mobile    ANDRAE, KLEBE [phone]         Other Contacts   None on File        Current Medical History  Patient Admitting Diagnosis: R CVA                           History of Present Illness:  Clayton [name] is a 88 y.o. male with medical history significant of cognitive impairment, history of TBI, hypertension presenting with weakness, confusion.  Noted admission February 2024 with multifocal pneumonia that resulted in worsening cognitive impairment per the wife.  He was  admitted to [facility] 12/12/22 for stroke work up. Pt. Presented to the ER afebrile, hemodynamically stable.  White count 7, hemoglobin 14.4, platelets 299, COVID flu RSV negative, ammonia level within normal limits, urinalysis not indicative of infection.  CT head within normal limits .  MRI of the brain showed scattered small acute to subacute infarcts in the right PCA distribution.  LDL 98, HDL 39.  A1c 6.1.  Patient is treated with atorvastatin, Plavix.  He previous had reaction to aspirin, could not tolerated.  Decision was made to treat with Plavix for long-term. Echocardiogram was also performed, showed normal ejection fraction without a source of thrombosis. Zio patch to be placed prior to d/c. Pt. Seen by PT,OT, SLP and they recommend CIR to assist return to PLOF.    Complete NIHSS TOTAL: 2   Patient's medical record from [facility] has been reviewed by the rehabilitation admission coordinator and physician.   Past Medical History      Past Medical History:  Diagnosis Date   Acute gastrointestinal hemorrhage     Acute hypoxemic respiratory failure (HCC) 05/25/2022   Acute thrombotic stroke (HCC) 12/14/2022  Basal cell carcinoma     Blood in stool 08/30/2018   Blood transfusion without reported diagnosis 40 (age 60)    After traumatic brain injury   Broken ribs      2 BROKEN ON RIBS   CKD (chronic kidney disease)      PT DENIES   DVT (deep venous thrombosis) (HCC)      RIGHT LEG   Fracture dislocation of wrist      RIGHT   GI bleed 09/02/2018   Hiatal hernia with GERD     HTN (hypertension)     Hypokalemia 05/26/2022   Hyponatremia 05/25/2022   Symptomatic anemia 09/03/2018          Has the patient had major surgery during 100 days prior to admission? No   Family History   family history includes Heart disease in his father.   Current Medications  Current Medications    Current Facility-Administered Medications:     acetaminophen (TYLENOL) tablet 650 mg, 650 mg, Oral, Q4H PRN, Mansy, Jan A, MD, 650 mg at 12/14/22 0001   atorvastatin (LIPITOR) tablet 40 mg, 40 mg, Oral, Daily, Rejeana Brock, MD, 40 mg at 12/15/22 0850   busPIRone (BUSPAR) tablet 7.5 mg, 7.5 mg, Oral, BID, Floydene Flock, MD, 7.5 mg at 12/15/22 0850   [COMPLETED] clopidogrel (PLAVIX) tablet 300 mg, 300 mg, Oral, Once, 300 mg at 12/12/22 2322 **AND** clopidogrel (PLAVIX) tablet 75 mg, 75 mg, Oral, Daily, Rejeana Brock, MD, 75 mg at 12/15/22 0850   enoxaparin (LOVENOX) injection 40 mg, 40 mg, Subcutaneous, Q24H, Wieting, Richard, MD, 40 mg at 12/15/22 0849   haloperidol (HALDOL) tablet 2 mg, 2 mg, Oral, Q6H PRN **OR** haloperidol lactate (HALDOL) injection 2 mg, 2 mg, Intramuscular, Q6H PRN, Marrion Coy, MD, 2 mg at 12/13/22 1442   QUEtiapine (SEROQUEL) tablet 25 mg, 25 mg, Oral, QHS, Marrion Coy, MD, 25 mg at 12/14/22 2107   ziprasidone (GEODON) injection 10 mg, 10 mg, Intramuscular, Q6H PRN, Mansy, Vernetta Honey, MD     Patients Current Diet:  Diet Order                  Diet Heart Room service appropriate? Yes with Assist; Fluid consistency: Thin  Diet effective now                         Precautions / Restrictions Precautions Precautions: Fall Restrictions Weight Bearing Restrictions: No    Has the patient had 2 or more falls or a fall with injury in the past year? Yes   Prior Activity Level Community (5-7x/wk): Pt. went to art studio daily   Prior Functional Level Self Care: Did the patient need help bathing, dressing, using the toilet or eating? Independent   Indoor Mobility: Did the patient need assistance with walking from room to room (with or without device)? Independent   Stairs: Did the patient need assistance with internal or external stairs (with or without device)? Independent   Functional Cognition: Did the patient need help planning regular tasks such as shopping or remembering to take  medications? Needed some help   Patient Information Are you of Hispanic, Latino/a,or Spanish origin?: A. No, not of Hispanic, Latino/a, or Spanish origin What is your race?: A. White Do you need or want an interpreter to communicate with a doctor or health care staff?: 0. No   Patient's Response To:  Health Literacy and Transportation Is the patient able to respond to  health literacy and transportation needs?: Yes Health Literacy - How often do you need to have someone help you when you read instructions, pamphlets, or other written material from your doctor or pharmacy?: Sometimes In the past 12 months, has lack of transportation kept you from medical appointments or from getting medications?: No In the past 12 months, has lack of transportation kept you from meetings, work, or from getting things needed for daily living?: No   Journalist, newspaper / Equipment Home Assistive Devices/Equipment: Eyeglasses Home Equipment: Agricultural consultant (2 wheels), [employer] - single point, Information systems manager - built in   Prior Device Use: Indicate devices/aids used by the patient prior to current illness, exacerbation or injury? None of the above   Current Functional Level Cognition   Arousal/Alertness: Awake/alert Overall Cognitive Status: Impaired/Different from baseline Current Attention Level: Focused Orientation Level: Oriented to person, Disoriented to place, Disoriented to time, Disoriented to situation Following Commands: Follows one step commands with increased time (multi modal cueing) Safety/Judgement: Decreased awareness of safety, Decreased awareness of deficits General Comments: Pt much more agitated and confused this session, difficulty following commands Attention: Focused, Sustained Focused Attention: Impaired Focused Attention Impairment: Verbal basic, Functional basic Sustained Attention: Impaired Sustained Attention Impairment: Verbal basic, Functional basic Memory: Impaired  (baseline) Awareness: Impaired Awareness Impairment: Anticipatory impairment Problem Solving: Impaired Problem Solving Impairment: Verbal basic, Functional basic Executive Function: Reasoning, Sequencing Reasoning: Impaired Reasoning Impairment: Verbal basic, Functional basic Sequencing: Impaired Sequencing Impairment: Verbal basic, Functional basic Behaviors: Restless, Impulsive (requires redirection) Safety/Judgment: Impaired Comments: impulsively gets up; difficulty walking around things safely    Extremity Assessment (includes Sensation/Coordination)   Upper Extremity Assessment: Right hand dominant, LUE deficits/detail LUE Deficits / Details: decreased coordinationa nd strength LUE Sensation: WNL LUE Coordination: decreased fine motor, decreased gross motor  Lower Extremity Assessment: Generalized weakness, LLE deficits/detail (Global MMT testing, UE's and LE's are similar bilaterally throughout and [facility] excluding grip strength where R > L) LLE Deficits / Details: variable sensation from knee down on both sides, pt unable to elaborate further just stating sensation felt "very close" to being the same. LLE Sensation: decreased light touch     ADLs   Overall ADL's : Needs assistance/impaired Grooming: Wash/dry hands, Standing, Contact guard assist, Minimal assistance Grooming Details (indicate cue type and reason): frequent vcs for head turn to the L for ADL items in L visual field Lower Body Dressing: Minimal assistance, Sitting/lateral leans Lower Body Dressing Details (indicate cue type and reason): socks Toilet Transfer: Minimal assistance Toilet Transfer Details (indicate cue type and reason): frequent vcs for safety due to visual field deficit Toileting- Clothing Manipulation and Hygiene: Minimal assistance, Sit to/from stand Functional mobility during ADLs: Minimal assistance (hand held assist approx 15' in room two attempts to and from bathroom)     Mobility   Overal bed  mobility: Modified Independent General bed mobility comments: NT in recliner pre/post session     Transfers   Overall transfer level: Needs assistance Equipment used: Rolling walker (2 wheels), 1 person hand held assist Transfers: Sit to/from Stand Sit to Stand: Min assist General transfer comment: Min A for stability     Ambulation / Gait / Stairs / Wheelchair Mobility   Ambulation/Gait Ambulation/Gait assistance: Min assist, Mod assist Gait Distance (Feet): 15 Feet Assistive device: Rolling walker (2 wheels), 1 person hand held assist Gait Pattern/deviations: Drifts right/left, Staggering right, Staggering left, Trunk flexed General Gait Details: Min to mod A for stability this session with pt presenting with difficulty  following commands for safe use of RW Gait velocity: decreased     Posture / Balance Balance Overall balance assessment: Needs assistance Sitting-balance support: Feet supported, No upper extremity supported Sitting balance-Leahy Scale: Fair Standing balance support: Single extremity supported, Bilateral upper extremity supported, During functional activity Standing balance-Leahy Scale: Poor Standing balance comment: pt with poor sequencing with use of RW     Special needs/care consideration Special service needs **    Previous Home Environment (from acute therapy documentation) Living Arrangements: Spouse/significant other  Lives With: Spouse Available Help at Discharge: Family Type of Home: House Home Layout: One level Home Access: Stairs to enter Entrance Stairs-Rails: Left Entrance Stairs-Number of Steps: 8 Bathroom Shower/Tub: Health visitor: Handicapped height Bathroom Accessibility: Yes Home Care Services: No   Discharge Living Setting Plans for Discharge Living Setting: Patient's home Type of Home at Discharge: House Discharge Home Layout: One level Discharge Home Access: Stairs to enter Entrance Stairs-Rails: Left Entrance  Stairs-Number of Steps: 8 Discharge Bathroom Shower/Tub: Walk-in shower Discharge Bathroom Toilet: Handicapped height Discharge Bathroom Accessibility: Yes How Accessible: Accessible via walker Does the patient have any problems obtaining your medications?: No   Social/Family/Support Systems Patient Roles: Spouse Contact Information: [phone] Anticipated Caregiver: [name] Ability/Limitations of Caregiver: 24/7 min A Caregiver Availability: 24/7 Discharge Plan Discussed with Primary Caregiver: Yes Is Caregiver In Agreement with Plan?: Yes Does Caregiver/Family have Issues with Lodging/Transportation while Pt is in Rehab?: No   Goals Patient/Family Goal for Rehab: PT/OT/SLP Supervision Expected length of stay: 10-12 days Pt/Family Agrees to Admission and willing to participate: Yes Program Orientation Provided & Reviewed with Pt/Caregiver Including Roles  & Responsibilities: Yes   Decrease burden of Care through IP rehab admission: not anticipated   Possible need for SNF placement upon discharge: not anticipated    Patient Condition: I have reviewed medical records from [facility], spoken with CM, and patient and spouse. I discussed via phone for inpatient rehabilitation assessment.  Patient will benefit from ongoing PT, OT, and SLP, can actively participate in 3 hours of therapy a day 5 days of the week, and can make measurable gains during the admission.  Patient will also benefit from the coordinated team approach during an Inpatient Acute Rehabilitation admission.  The patient will receive intensive therapy as well as Rehabilitation physician, nursing, social worker, and care management interventions.  Due to safety, skin/wound care, disease management, medication administration, pain management, and patient education the patient requires 24 hour a day rehabilitation nursing.  The patient is currently min to mod assist with mobility and basic ADLs.  Discharge  setting and therapy post discharge at home with home health is anticipated.  Patient has agreed to participate in the Acute Inpatient Rehabilitation Program and will admit today.   Preadmission Screen Completed By:  Trish Mage, 12/15/2022 10:04 AM ______________________________________________________________________   Discussed status with Dr. Shearon Stalls on 12/15/22 at 0945 and received approval for admission today.   Admission Coordinator:  Trish Mage, RN, time 1005/Date 12/15/22    Assessment/Plan: Diagnosis: Does the need for close, 24 hr/day Medical supervision in concert with the patient's rehab needs make it unreasonable for this patient to be served in a less intensive setting? Yes Co-Morbidities requiring supervision/potential complications: delirium/impulsivity/cognitive impairment, hypertension, intermittent tachycardia, and pain in left hand s/p fall Due to safety, skin/wound care, disease management, medication administration, pain management, and patient education, does the patient require 24 hr/day rehab nursing? Yes Does the patient require coordinated care of  a physician, rehab nurse, PT, OT, and SLP to address physical and functional deficits in the context of the above medical diagnosis(es)? Yes Addressing deficits in the following areas: balance, endurance, locomotion, strength, transferring, bathing, dressing, feeding, grooming, toileting, and cognition Can the patient actively participate in an intensive therapy program of at least 3 hrs of therapy 5 days a week? Yes The potential for patient to make measurable gains while on inpatient rehab is good Anticipated functional outcomes upon discharge from inpatient rehab: supervision PT, supervision OT, supervision SLP Estimated rehab length of stay to reach the above functional goals is: 10-12 days Anticipated discharge destination: Home 10. Overall Rehab/Functional Prognosis: good     MD Signature:   Angelina Sheriff,  DO 12/15/2022            Revision History  Routing History

## 2022-12-15 NOTE — Progress Notes (Signed)
Inpatient Rehabilitation Admission Medication Review by a Pharmacist  A complete drug regimen review was completed for this patient to identify any potential clinically significant medication issues.  High Risk Drug Classes Is patient taking? Indication by Medication  Antipsychotic Yes Compazine prn N/V Quetiapine - agitation  Anticoagulant Yes Lovenox - VTE ppx  Antibiotic No   Opioid No   Antiplatelet Yes Clopidogrel - CVA  Hypoglycemics/insulin No   Vasoactive Medication No   Chemotherapy No   Other Yes Atorvastatin - HLD Buspirone - Anxiety Pantoprazole - Reflux      Type of Medication Issue Identified Description of Issue Recommendation(s)  Drug Interaction(s) (clinically significant)     Duplicate Therapy     Allergy     No Medication Administration End Date     Incorrect Dose     Additional Drug Therapy Needed     Significant med changes from prior encounter (inform family/care partners about these prior to discharge).    Other       Clinically significant medication issues were identified that warrant physician communication and completion of prescribed/recommended actions by midnight of the next day:  No  Name of provider notified for urgent issues identified:   Provider Method of Notification:     Pharmacist comments: None  Time spent performing this drug regimen review (minutes):  20 minutes  Thank you Okey Regal, PharmD

## 2022-12-15 NOTE — Progress Notes (Signed)
Occupational Therapy Assessment and Plan  Patient Details  Name: Clayton [name] MRN: [id number] Date of Birth: Nov 05, 1934  OT Diagnosis: ataxia, cognitive deficits, hemiplegia affecting non-dominant side, and muscle weakness (generalized) Rehab Potential: Rehab Potential (ACUTE ONLY): Excellent ELOS: 7-10 days   Today's Date: 12/16/2022 OT Individual Time: 4696-2952 OT Individual Time Calculation (min): 58 min     Hospital Problem: Principal Problem:   Stroke (cerebrum) [facility])   Past Medical History:  Past Medical History:  Diagnosis Date   Acute gastrointestinal hemorrhage    Acute hypoxemic respiratory failure (HCC) 05/25/2022   Acute thrombotic stroke (HCC) 12/14/2022   Basal cell carcinoma    Blood in stool 08/30/2018   Blood transfusion without reported diagnosis 76 (age 6)   After traumatic brain injury   Broken ribs    2 BROKEN ON RIBS   CKD (chronic kidney disease)    PT DENIES   Color blindness    DVT (deep venous thrombosis) (HCC)    RIGHT LEG   Dyslexia    Fracture dislocation of wrist    RIGHT   GI bleed 09/02/2018   Hiatal hernia with GERD    HTN (hypertension)    Hypokalemia 05/26/2022   Hyponatremia 05/25/2022   Symptomatic anemia 09/03/2018   TBI (traumatic brain injury) (HCC)    w/skull Fx and crani in 1960s   Past Surgical History:  Past Surgical History:  Procedure Laterality Date   COLONOSCOPY  2014   cleared for 5 yrs   COLONOSCOPY N/A 09/01/2018   Procedure: COLONOSCOPY;  Surgeon: Pasty Spillers, MD;  Location: [facility] ENDOSCOPY;  Service: Endoscopy;  Laterality: N/A;   CRANIECTOMY FOR DEPRESSED SKULL FRACTURE  1960'S   ESOPHAGOGASTRODUODENOSCOPY N/A 09/01/2018   Procedure: ESOPHAGOGASTRODUODENOSCOPY (EGD);  Surgeon: Pasty Spillers, MD;  Location: [facility] ENDOSCOPY;  Service: Endoscopy;  Laterality: N/A;   HERNIA REPAIR     X2   IVC FILTER REMOVAL N/A 07/03/2017   Procedure: IVC FILTER REMOVAL;  Surgeon: Annice Needy, MD;   Location: [facility] INVASIVE CV LAB;  Service: Cardiovascular;  Laterality: N/A;   LUMBAR LAMINECTOMY/DECOMPRESSION MICRODISCECTOMY N/A 04/20/2020   Procedure: L2/3, L3/4 LAMINECTOMY;  Surgeon: Lucy Chris, MD;  Location: [facility] ORS;  Service: Neurosurgery;  Laterality: N/A;  2ND CASE   PERIPHERAL VASCULAR THROMBECTOMY Right 05/22/2017   Procedure: PERIPHERAL VASCULAR THROMBECTOMY;  Surgeon: Annice Needy, MD;  Location: [facility] INVASIVE CV LAB;  Service: Cardiovascular;  Laterality: Right;   SPINE SURGERY  December 2021   TONSILLECTOMY      Assessment & Plan Clinical Impression: Patient is a 88 y.o. year old male with recent admission to the hospital  with medical history significant of cognitive impairment, history of TBI, hypertension presenting with weakness, confusion.  Is seen by neurology, MRI of the brain showed scattered small acute to subacute infarcts in the right PCA distribution.  LDL 98, HDL 39.  A1c 6.1.  Patient is treated with atorvastatin, Plavix.  He previous had reaction to aspirin, could not tolerated.  Decision is made to treat with Plavix for long-term. Echocardiogram was also performed, showed normal ejection fraction without a source of thrombosis..  Patient transferred to CIR on 12/15/2022 .    Patient currently requires min with basic self-care skills secondary to muscle weakness, impaired timing and sequencing, unbalanced muscle activation, ataxia, and decreased coordination, decreased attention to left and decreased motor planning, decreased attention, decreased awareness, decreased problem solving, and decreased memory, and decreased standing balance, decreased postural control, hemiplegia, and decreased  balance strategies.  Prior to hospitalization, patient could complete BADL with independent .  Patient will benefit from skilled intervention to increase independence with basic self-care skills prior to discharge home with care partner.  Anticipate patient will require 24 hour  supervision and follow up outpatient.  OT - End of Session Endurance Deficit: Yes Endurance Deficit Description: rest breaks within BADL tasks OT Assessment Rehab Potential (ACUTE ONLY): Excellent OT Patient demonstrates impairments in the following area(s): Balance;Cognition;Endurance;Motor;Perception;Safety OT Basic ADL's Functional Problem(s): Grooming;Bathing;Dressing;Toileting OT Transfers Functional Problem(s): Toilet;Tub/Shower OT Additional Impairment(s): Fuctional Use of Upper Extremity OT Plan OT Intensity: Minimum of 1-2 x/day, 45 to 90 minutes OT Frequency: 5 out of 7 days OT Duration/Estimated Length of Stay: 7-10 days OT Treatment/Interventions: Balance/vestibular training;Cognitive remediation/compensation;Community reintegration;Discharge planning;Disease mangement/prevention;DME/adaptive equipment instruction;Functional electrical stimulation;Functional mobility training;Neuromuscular re-education;Pain management;Patient/family education;Psychosocial support;Self Care/advanced ADL retraining;Skin care/wound managment;Splinting/orthotics;Therapeutic Activities;Therapeutic Exercise;UE/LE Strength taining/ROM;UE/LE Coordination activities;Visual/perceptual remediation/compensation;Wheelchair propulsion/positioning OT Basic Self-Care Anticipated Outcome(s): Mod I OT Toileting Anticipated Outcome(s): Mod I OT Bathroom Transfers Anticipated Outcome(s): Mod i/supervision OT Recommendation Patient destination: Home Follow Up Recommendations: Outpatient OT Equipment Recommended: To be determined   OT Evaluation Precautions/Restrictions  Precautions Precautions: Fall Restrictions Weight Bearing Restrictions: No Pain  Denies pain Home Living/Prior Functioning Home Living Family/patient expects to be discharged to:: Private residence Living Arrangements: Spouse/significant other Available Help at Discharge: Family Type of Home: House Home Access: Stairs to enter Water quality scientist of Steps: 4 Entrance Stairs-Rails: Left Home Layout: One level Bathroom Shower/Tub: Tub/shower unit Additional Comments: 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  Lives With: Spouse IADL History Current License: Yes IADL Comments: Pt does most of the cooking and grocery shopping Prior Function Vocation: Retired (retired Personnel officer, now is an Tree surgeon) Vision Baseline Vision/History: 1 Wears glasses Ability to See in Adequate Light: 1 Impaired Patient Visual Report: No change from baseline Vision Assessment?: Vision impaired- to be further tested in functional context Perception  Perception: Impaired Cognition Cognition Overall Cognitive Status: Impaired/Different from baseline Arousal/Alertness: Awake/alert Orientation Level: Person;Place;Situation Person: Oriented Place: Disoriented Situation: Disoriented Memory: Impaired Focused Attention: Impaired Awareness: Impaired Behaviors: Impulsive Safety/Judgment: Impaired Brief Interview for Mental Status (BIMS) Repetition of Three Words (First Attempt): 3 Temporal Orientation: Year: Missed by more than 5 years Temporal Orientation: Month: Missed by 6 days to 1 month Temporal Orientation: Day: Incorrect Recall: "Sock": Yes, after cueing ("something to wear") Recall: "Blue": Yes, after cueing ("a color") Recall: "Bed": No, could not recall BIMS Summary Score: 6 Sensation Sensation Light Touch: Appears Intact Hot/Cold: Appears Intact Coordination Gross Motor Movements are Fluid and Coordinated: No Fine Motor Movements are Fluid and Coordinated: No Coordination and Movement Description: decreased smoothness and accuracy with L hand Motor  Motor Motor: Hemiplegia  Balance Balance Balance Assessed: Yes Static Sitting Balance Static Sitting - Balance Support: Feet supported Static Sitting - Level of Assistance: 5: Stand by assistance Dynamic  Sitting Balance Dynamic Sitting - Level of Assistance: 5: Stand by assistance Static Standing Balance Static Standing - Balance Support: During functional activity Static Standing - Level of Assistance: 4: Min assist Dynamic Standing Balance Dynamic Standing - Balance Support: During functional activity Dynamic Standing - Level of Assistance: 4: Min assist Extremity/Trunk Assessment RUE Assessment RUE Assessment: Within Functional Limits LUE Assessment LUE Assessment: Exceptions to [facility] General Strength Comments: 4/5 overall, deficits more in coordination  Care Tool Care Tool Self Care Eating   Eating Assist Level:  Supervision/Verbal cueing    Oral Care    Oral Care Assist Level: Contact Guard/Toucning assist    Bathing   Body parts bathed by patient: Right arm;Left arm;Abdomen;Chest;Buttocks;Front perineal area;Left upper leg;Right upper leg;Right lower leg;Left lower leg;Face     Assist Level: Minimal Assistance - Patient > 75%    Upper Body Dressing(including orthotics)   What is the patient wearing?: Pull over shirt   Assist Level: Minimal Assistance - Patient > 75%    Lower Body Dressing (excluding footwear)   What is the patient wearing?: Underwear/pull up;Pants Assist for lower body dressing: Minimal Assistance - Patient > 75%    Putting on/Taking off footwear   What is the patient wearing?: Non-skid slipper socks Assist for footwear: Minimal Assistance - Patient > 75%       Care Tool Toileting Toileting activity   Assist for toileting: Minimal Assistance - Patient > 75%     Care Tool Bed Mobility Roll left and right activity        Sit to lying activity   Sit to lying assist level: Supervision/Verbal cueing    Lying to sitting on side of bed activity   Lying to sitting on side of bed assist level: the ability to move from lying on the back to sitting on the side of the bed with no back support.: Supervision/Verbal cueing     Care Tool Transfers Sit  to stand transfer   Sit to stand assist level: Minimal Assistance - Patient > 75%    Chair/bed transfer   Chair/bed transfer assist level: Minimal Assistance - Patient > 75%     Toilet transfer   Assist Level: Minimal Assistance - Patient > 75%     Care Tool Cognition  Expression of Ideas and Wants Expression of Ideas and Wants: 3. Some difficulty - exhibits some difficulty with expressing needs and ideas (e.g, some words or finishing thoughts) or speech is not clear  Understanding Verbal and Non-Verbal Content Understanding Verbal and Non-Verbal Content: 4. Understands (complex and basic) - clear comprehension without cues or repetitions   Memory/Recall Ability Memory/Recall Ability : Current season;That he or she is in a hospital/hospital unit   Refer to Care Plan for Long Term Goals  SHORT TERM GOAL WEEK 1 OT Short Term Goal 1 (Week 1): LTG=STG 2/2 ELOS  Recommendations for other services: Therapeutic Recreation  Pet therapy, Kitchen group, and Outing/community reintegration   Skilled Therapeutic Intervention Pt greeted semi-reclined in bed and agreeable to OT eval and treat. OT eval completed addressing rehab process, OT purpose, POC, ELOS, and goals. Pt preformed functional BADL tasks and transfers as described in detail below. lPt demonstrated left inattention and tendency to bump into items on the left, decreased coordination LUE,  cognitive deficits,balance deficits and poor safety awareness. Pt left seated in wc at end of session with alarm belt on, call bell in reach, and needs met .  ADL ADL Eating: Supervision/safety Grooming: Supervision/safety Upper Body Bathing: Supervision/safety Lower Body Bathing: Minimal assistance Upper Body Dressing: Minimal assistance Lower Body Dressing: Minimal assistance Toileting: Minimal assistance Toilet Transfer: Minimal assistance Tub/Shower Transfer: Minimal assistance Mobility  Bed Mobility Bed Mobility: Supine to Sit;Sit to  Supine Supine to Sit: Supervision/Verbal cueing Sit to Supine: Supervision/Verbal cueing Transfers Sit to Stand: Minimal Assistance - Patient > 75% Stand to Sit: Minimal Assistance - Patient > 75%   Discharge Criteria: Patient will be discharged from OT if patient refuses treatment 3 consecutive times without medical reason, if treatment  goals not met, if there is a change in medical status, if patient makes no progress towards goals or if patient is discharged from hospital.  The above assessment, treatment plan, treatment alternatives and goals were discussed and mutually agreed upon: by patient  Mal Amabile 12/16/2022, 9:38 AM

## 2022-12-15 NOTE — TOC Transition Note (Signed)
Transition of Care [facility]) - CM/SW Discharge Note   Patient Details  Name: Clayton [name] MRN: [id number] Date of Birth: 08/30/1934  [facility] CM/SW Contact:  Allena Katz, LCSW Phone Number: 12/15/2022, 10:01 AM   Clinical Narrative:   [facility] to call [facility]. Medical necessity printed to unit.           Patient Goals and CMS Choice      Discharge Placement                         Discharge Plan and Services Additional resources added to the After Visit Summary for                                       Social Determinants of Health (SDOH) Interventions SDOH Screenings   Food Insecurity: No Food Insecurity (12/12/2022)  Housing: Low Risk  (12/12/2022)  Transportation Needs: No Transportation Needs (12/12/2022)  Utilities: Not At Risk (12/12/2022)  Alcohol Screen: Low Risk  (11/10/2022)  Depression (PHQ2-9): Low Risk  (11/10/2022)  Financial Resource Strain: Low Risk  (11/10/2022)  Physical Activity: Inactive (11/10/2022)  Social Connections: Moderately Isolated (11/10/2022)  Stress: No Stress Concern Present (11/10/2022)  Tobacco Use: Medium Risk (12/12/2022)  Health Literacy: Adequate Health Literacy (11/10/2022)     Readmission Risk Interventions     No data to display

## 2022-12-15 NOTE — Discharge Summary (Signed)
Physician Discharge Summary   Patient: Clayton [name] MRN: [id number] DOB: [birth year]  Admit date:     12/12/2022  Discharge date: 12/15/22  Discharge Physician: Alford Highland   PCP: Duanne Limerick, MD   Recommendations at discharge:    Follow up tean at Acute inpatient rehab one day Follow up with PCP upon discharge from rehab Follow up cardiology  Discharge Diagnoses: Principal Problem:   Acute thrombotic stroke [facility]) Active Problems:   Acute delirium   Weakness   Cognitive impairment   HTN (hypertension)   Overweight (BMI 25.0-29.9)    Hospital Course: Clayton [name] is a 88 y.o. male with medical history significant of cognitive impairment, history of TBI, hypertension presenting with weakness, confusion.  Is seen by neurology, MRI of the brain showed scattered small acute to subacute infarcts in the right PCA distribution.  LDL 98, HDL 39.  A1c 6.1.  Patient is treated with atorvastatin, Plavix.  He previous had reaction to aspirin, could not tolerated.  Decision is made to treat with Plavix for long-term. Echocardiogram was also performed, showed normal ejection fraction without a source of thrombosis. ZIO recorder is placed. 8/15:  Discharge to acute inpatient rehab.  Assessment and Plan: * Acute thrombotic stroke (HCC) Right PCA distribution.  Acute thrombotic stroke as per neurology.  Neurology recommends Plavix and atorvastatin.  Discharge to acute inpatient rehab today.  Acute delirium Improved with Seroquel at night in the hospital.  Will make prn at rehab and hopefully can be discontinued soon.  Weakness PT recommending acute inpatient rehab   Cognitive impairment Baseline cognitive impairment in setting of history of TBI     HTN (hypertension) Blood pressure stable.  Not on any blood pressure medication.  Overweight (BMI 25.0-29.9) BMI 28.19.         Consultants: Neurology Procedures performed: none Disposition: Acute  inpatient rehab Diet recommendation:  Cardiac diet DISCHARGE MEDICATION: Allergies as of 12/15/2022       Reactions   Aspirin Rash   Sulfa Antibiotics Rash        Medication List     TAKE these medications    acetaminophen 500 MG tablet Commonly known as: TYLENOL Take 1,000 mg by mouth every 6 (six) hours as needed for mild pain or moderate pain.   atorvastatin 40 MG tablet Commonly known as: LIPITOR Take 1 tablet (40 mg total) by mouth daily. Start taking on: December 16, 2022   busPIRone 7.5 MG tablet Commonly known as: BUSPAR Take by mouth.   clopidogrel 75 MG tablet Commonly known as: PLAVIX Take 1 tablet (75 mg total) by mouth daily. Start taking on: December 16, 2022   gabapentin 100 MG capsule Commonly known as: NEURONTIN Take 1 capsule (100 mg total) by mouth at bedtime. What changed: See the new instructions.   MULTI VIT/FL PO Take by mouth.   pantoprazole 40 MG tablet Commonly known as: PROTONIX Take 1 tablet (40 mg total) by mouth daily.   QUEtiapine 25 MG tablet Commonly known as: SEROQUEL Take 1 tablet (25 mg total) by mouth at bedtime as needed (agitation, insomnia).   vitamin B-12 500 MCG tablet Commonly known as: CYANOCOBALAMIN Take 1 tablet (500 mcg total) by mouth daily. What changed:  medication strength how much to take when to take this   ZYRTEC PO Take by mouth.        Follow-up Information     Callwood, Bobbie Stack, MD. Go in 5 week(s).   Specialties: Cardiology, Internal Medicine Why:  follow up event monitor Contact information: [address] [place] [address] [phone]                Discharge Exam: Ceasar Mons Weights   12/12/22 0746  Weight: 81.6 kg   Physical Exam HENT:     Head: Normocephalic.     Mouth/Throat:     Pharynx: No oropharyngeal exudate.  Eyes:     General: Lids are normal.     Conjunctiva/sclera: Conjunctivae normal.  Cardiovascular:     Rate and Rhythm: Normal rate and regular  rhythm.     Heart sounds: Normal heart sounds, S1 normal and S2 normal.  Pulmonary:     Breath sounds: No decreased breath sounds, wheezing, rhonchi or rales.  Abdominal:     Palpations: Abdomen is soft.     Tenderness: There is no abdominal tenderness.  Musculoskeletal:     Right lower leg: No swelling.     Left lower leg: No swelling.  Skin:    General: Skin is warm.     Findings: No rash.  Neurological:     Mental Status: He is alert.     Comments: Answer simple questions appropriately.  Able to straight leg raise.      Condition at discharge: stable  The results of significant diagnostics from this hospitalization (including imaging, microbiology, ancillary and laboratory) are listed below for reference.   Imaging Studies: ECHOCARDIOGRAM COMPLETE  Result Date: 12/13/2022    ECHOCARDIOGRAM REPORT   Patient Name:   Clayton [name] Date of Exam: 12/13/2022 Medical Rec #:  [id number]           Height:       67.0 in Accession #:    [id number]          Weight:       180.0 lb Date of Birth:  1935/03/02           BSA:          1.934 m Patient Age:    87 years            BP:           139/73 mmHg Patient Gender: M                   HR:           76 bpm. Exam Location:  [facility] Procedure: 2D Echo, Cardiac Doppler and Color Doppler Indications:     TIA G45.9  History:         Patient has prior history of Echocardiogram examinations, most                  recent 06/03/2022. Risk Factors:Hypertension.  Sonographer:     Cristela Blue Referring Phys:  [id number] Francoise Schaumann [place] Diagnosing Phys: Yvonne Kendall MD IMPRESSIONS  1. Left ventricular ejection fraction, by estimation, is 55 to 60%. The left ventricle has normal function. The left ventricle has no regional wall motion abnormalities. Left ventricular diastolic parameters are consistent with Grade I diastolic dysfunction (impaired relaxation).  2. Right ventricular systolic function is normal. The right ventricular size is normal.  3. The mitral valve is  degenerative. Mild mitral valve regurgitation. No evidence of mitral stenosis. There is mild late systolic prolapse of both leaflets of the mitral valve.  4. The aortic valve is tricuspid. There is mild calcification of the aortic valve. There is mild thickening of the aortic valve. Aortic valve regurgitation is mild. FINDINGS  Left  Ventricle: Left ventricular ejection fraction, by estimation, is 55 to 60%. The left ventricle has normal function. The left ventricle has no regional wall motion abnormalities. The left ventricular internal cavity size was normal in size. There is  borderline left ventricular hypertrophy. Left ventricular diastolic parameters are consistent with Grade I diastolic dysfunction (impaired relaxation). Right Ventricle: The right ventricular size is normal. No increase in right ventricular wall thickness. Right ventricular systolic function is normal. Left Atrium: Left atrial size was normal in size. Right Atrium: Right atrial size was normal in size. Pericardium: The pericardium was not well visualized. Mitral Valve: The mitral valve is degenerative in appearance. There is mild late systolic prolapse of both leaflets of the mitral valve. There is mild thickening of the mitral valve leaflet(s). There is mild calcification of the mitral valve leaflet(s). Mild mitral valve regurgitation. No evidence of mitral valve stenosis. Tricuspid Valve: The tricuspid valve is normal in structure. Tricuspid valve regurgitation is trivial. Aortic Valve: The aortic valve is tricuspid. There is mild calcification of the aortic valve. There is mild thickening of the aortic valve. Aortic valve regurgitation is mild. Aortic regurgitation PHT measures 559 msec. Aortic valve mean gradient measures 3.0 mmHg. Aortic valve peak gradient measures 4.6 mmHg. Aortic valve area, by VTI measures 4.17 cm. Pulmonic Valve: The pulmonic valve was normal in structure. Pulmonic valve regurgitation is trivial. No evidence of  pulmonic stenosis. Aorta: The aortic root and ascending aorta are structurally normal, with no evidence of dilitation. Pulmonary Artery: The pulmonary artery is of normal size. Venous: The inferior vena cava was not well visualized. IAS/Shunts: The interatrial septum was not well visualized.  LEFT VENTRICLE PLAX 2D LVIDd:         3.80 cm   Diastology LVIDs:         2.60 cm   LV e' medial:    6.42 cm/s LV PW:         1.10 cm   LV E/e' medial:  10.1 LV IVS:        1.00 cm   LV e' lateral:   8.59 cm/s LVOT diam:     2.20 cm   LV E/e' lateral: 7.6 LV SV:         82 LV SV Index:   42 LVOT Area:     3.80 cm  RIGHT VENTRICLE RV Basal diam:  2.60 cm RV Mid diam:    2.60 cm RV S prime:     12.30 cm/s TAPSE (M-mode): 1.9 cm LEFT ATRIUM             Index        RIGHT ATRIUM           Index LA diam:        2.30 cm 1.19 cm/m   RA Area:     11.80 cm LA Vol (A2C):   26.2 ml 13.55 ml/m  RA Volume:   23.70 ml  12.26 ml/m LA Vol (A4C):   45.3 ml 23.43 ml/m LA Biplane Vol: 35.4 ml 18.31 ml/m  AORTIC VALVE AV Area (Vmax):    3.55 cm AV Area (Vmean):   3.45 cm AV Area (VTI):     4.17 cm AV Vmax:           107.00 cm/s AV Vmean:          76.900 cm/s AV VTI:            0.197 m AV Peak Grad:  4.6 mmHg AV Mean Grad:      3.0 mmHg LVOT Vmax:         99.80 cm/s LVOT Vmean:        69.800 cm/s LVOT VTI:          0.216 m LVOT/AV VTI ratio: 1.10 AI PHT:            559 msec  AORTA Ao Root diam: 3.13 cm MITRAL VALVE               TRICUSPID VALVE MV Area (PHT): 3.79 cm    TR Peak grad:   19.0 mmHg MV Decel Time: 200 msec    TR Vmax:        218.00 cm/s MV E velocity: 65.00 cm/s MV A velocity: 81.20 cm/s  SHUNTS MV E/A ratio:  0.80        Systemic VTI:  0.22 m                            Systemic Diam: 2.20 cm Yvonne Kendall MD Electronically signed by Yvonne Kendall MD Signature Date/Time: 12/13/2022/9:12:30 AM    Final    DG Hand 2 View Left  Result Date: 12/12/2022 CLINICAL DATA:  Trauma, fall, pain in left ring finger EXAM: LEFT  HAND - 2 VIEW COMPARISON:  None Available. FINDINGS: No recent fracture or dislocation is seen. Degenerative changes are noted with bony spurs in first carpometacarpal joint and interphalangeal joint of thumb. There are possible tiny bony spurs in other interphalangeal and metacarpophalangeal joints. There is soft tissue swelling over the dorsum of the left wrist. IMPRESSION: No recent fracture or dislocation is seen. Degenerative changes are noted in multiple joints, more so in the first carpometacarpal joint. Electronically Signed   By: Ernie Avena M.D.   On: 12/12/2022 18:36   CT ANGIO HEAD NECK W WO CM  Result Date: 12/12/2022 CLINICAL DATA:  Stroke/TIA.  Determine embolic source. EXAM: CT ANGIOGRAPHY HEAD AND NECK WITH AND WITHOUT CONTRAST TECHNIQUE: Multidetector CT imaging of the head and neck was performed using the standard protocol during bolus administration of intravenous contrast. Multiplanar CT image reconstructions and MIPs were obtained to evaluate the vascular anatomy. Carotid stenosis measurements (when applicable) are obtained utilizing NASCET criteria, using the distal internal carotid diameter as the denominator. RADIATION DOSE REDUCTION: This exam was performed according to the departmental dose-optimization program which includes automated exposure control, adjustment of the mA and/or kV according to patient size and/or use of iterative reconstruction technique. CONTRAST:  75mL OMNIPAQUE IOHEXOL 350 MG/ML SOLN COMPARISON:  Head CT same day.  MRI same day. FINDINGS: CTA NECK FINDINGS Aortic arch: Aortic atherosclerosis. Branching pattern is normal. 30% estimated stenosis of the proximal left subclavian artery. Right carotid system: Common carotid artery widely patent to the bifurcation. Soft and calcified plaque at the carotid bifurcation and ICA bulb but no stenosis. Cervical ICA widely patent beyond that. Left carotid system: Common carotid artery widely patent to the bifurcation.  Carotid bifurcation shows soft plaque but no stenosis. Cervical ICA widely patent beyond that. Vertebral arteries: Focal plaque at the right vertebral artery origin with 30-50% stenosis. As noted above, there is atherosclerotic change of the proximal left subclavian artery with stenosis estimated at 30%. Second focal stenosis of the subclavian artery just proximal to the vertebral artery origin with stenosis estimated at 30-50%. Focal calcified plaque at the proximal left vertebral artery with stenosis of 50%. Beyond that, the  vessel is widely patent through the cervical region to the foramen magnum. Skeleton: Mild cervical spondylosis and facet arthritis. Other neck: No mass or lymphadenopathy. Upper chest: Mild scarring at the lung apices. Review of the MIP images confirms the above findings CTA HEAD FINDINGS Anterior circulation: Both internal carotid arteries are patent through the skull base and siphon regions. There is siphon atherosclerotic calcification but no stenosis. The anterior and middle cerebral vessels are patent. No large vessel occlusion or proximal stenosis. No aneurysm or vascular malformation. Posterior circulation: Both vertebral arteries are patent through the foramen magnum to the basilar artery. No basilar stenosis. There is abrupt occlusion of the right PCA in the P1 segment. More distal branch vessels show reconstituted flow. Venous sinuses: Patent and normal. Anatomic variants: None significant. Review of the MIP images confirms the above findings IMPRESSION: 1. Abrupt occlusion of the right PCA in the P1 segment. More distal branch vessels show reconstituted flow. 2. Aortic atherosclerosis. 3. Atherosclerotic disease at both carotid bifurcations but without stenosis. 4. Atherosclerotic disease at both vertebral artery origins with 30-50% stenosis on the right and 50% stenosis on the left. 5. Atherosclerotic disease of the proximal left subclavian artery with stenosis estimated at 30%.  Second focal stenosis of the subclavian artery just proximal to the vertebral artery origin with stenosis estimated at 30-50%. Aortic Atherosclerosis (ICD10-I70.0). Electronically Signed   By: Paulina Fusi M.D.   On: 12/12/2022 14:58   MR BRAIN WO CONTRAST  Result Date: 12/12/2022 CLINICAL DATA:  Mental status change with unknown cause EXAM: MRI HEAD WITHOUT CONTRAST TECHNIQUE: Multiplanar, multiecho pulse sequences of the brain and surrounding structures were obtained without intravenous contrast. COMPARISON:  07/13/2022 FINDINGS: Brain: Small areas of restricted diffusion in the right occipital cortex, right hippocampus, and right thalamus. Other than the occipital cortex, diffusion restriction is fairly weak. Symmetric diffusion hyperintensity along the periphery of the bilateral cerebellum is likely artifactual. Generalized cerebral volume loss. No acute hemorrhage, hydrocephalus, mass, or collection. Mild chronic small vessel ischemia in the cerebral white matter. Vascular: Major flow voids are preserved Skull and upper cervical spine: Normal marrow signal. Sinuses/Orbits: Negative. Other: Intermittent mild motion artifact. IMPRESSION: 1. Scattered small acute to subacute infarcts in the right PCA distribution 2. Generalized brain atrophy. Electronically Signed   By: Tiburcio Pea M.D.   On: 12/12/2022 14:58   DG Chest Port 1 View  Result Date: 12/12/2022 CLINICAL DATA:  [id number] Confusion [id number] EXAM: PORTABLE CHEST 1 VIEW COMPARISON:  Chest x-ray 06/07/2022, CT chest 06/07/2022 FINDINGS: The heart and mediastinal contours are unchanged. Small to moderate volume hiatal hernia overlying the mediastinum No focal consolidation. No pulmonary edema. No pleural effusion. No pneumothorax. No acute osseous abnormality. IMPRESSION: 1. No active disease. 2. Small to moderate volume hiatal hernia. Electronically Signed   By: Tish Frederickson M.D.   On: 12/12/2022 14:02   CT HEAD WO CONTRAST  Result Date:  12/12/2022 CLINICAL DATA:  Provided history: Mental status change, unknown cause. Increased falls. Weakness. History of dementia. EXAM: CT HEAD WITHOUT CONTRAST TECHNIQUE: Contiguous axial images were obtained from the base of the skull through the vertex without intravenous contrast. RADIATION DOSE REDUCTION: This exam was performed according to the departmental dose-optimization program which includes automated exposure control, adjustment of the mA and/or kV according to patient size and/or use of iterative reconstruction technique. COMPARISON:  Brain MRI 07/13/2022.  Head CT 06/02/2022. FINDINGS: Brain: Moderate to moderately advanced generalized cerebral atrophy. Mild cerebellar atrophy. Patchy and ill-defined hypoattenuation within  the cerebral white matter, nonspecific but compatible with mild chronic small vessel ischemic disease. There is no acute intracranial hemorrhage. No demarcated cortical infarct. No extra-axial fluid collection. No evidence of an intracranial mass. No midline shift. Vascular: No hyperdense vessel.  Atherosclerotic calcifications. Skull: No calvarial fracture or aggressive osseous lesion. Sinuses/Orbits: No mass or acute finding within the imaged orbits. No significant paranasal sinus disease at the imaged levels. IMPRESSION: 1. No evidence of an acute intracranial abnormality. 2. Mild chronic small vessel ischemic changes within the cerebral white matter. 3. Moderate to moderately advanced generalized cerebral atrophy. 4. Mild cerebellar atrophy. Electronically Signed   By: Jackey Loge D.O.   On: 12/12/2022 08:23    Microbiology: Results for orders placed or performed during the hospital encounter of 12/12/22  Resp panel by RT-PCR (RSV, Flu A&B, Covid) Anterior Nasal Swab     Status: None   Collection Time: 12/12/22 10:50 AM   Specimen: Anterior Nasal Swab  Result Value Ref Range Status   SARS Coronavirus 2 by RT PCR NEGATIVE NEGATIVE Final    Comment: (NOTE) SARS-CoV-2  target nucleic acids are NOT DETECTED.  The SARS-CoV-2 RNA is generally detectable in upper respiratory specimens during the acute phase of infection. The lowest concentration of SARS-CoV-2 viral copies this assay can detect is 138 copies/mL. A negative result does not preclude SARS-Cov-2 infection and should not be used as the sole basis for treatment or other patient management decisions. A negative result may occur with  improper specimen collection/handling, submission of specimen other than nasopharyngeal swab, presence of viral mutation(s) within the areas targeted by this assay, and inadequate number of viral copies(<138 copies/mL). A negative result must be combined with clinical observations, patient history, and epidemiological information. The expected result is Negative.  Fact Sheet for Patients:  BloggerCourse.com  Fact Sheet for Healthcare Providers:  SeriousBroker.it  This test is no t yet approved or cleared by the [place] FDA and  has been authorized for detection and/or diagnosis of SARS-CoV-2 by FDA under an Emergency Use Authorization (EUA). This EUA will remain  in effect (meaning this test can be used) for the duration of the COVID-19 declaration under Section 564(b)(1) of the Act, 21 U.S.C.section 360bbb-3(b)(1), unless the authorization is terminated  or revoked sooner.       Influenza A by PCR NEGATIVE NEGATIVE Final   Influenza B by PCR NEGATIVE NEGATIVE Final    Comment: (NOTE) The Xpert Xpress SARS-CoV-2/FLU/RSV plus assay is intended as an aid in the diagnosis of influenza from Nasopharyngeal swab specimens and should not be used as a sole basis for treatment. Nasal washings and aspirates are unacceptable for Xpert Xpress SARS-CoV-2/FLU/RSV testing.  Fact Sheet for Patients: BloggerCourse.com  Fact Sheet for Healthcare  Providers: SeriousBroker.it  This test is not yet approved or cleared by the [place] FDA and has been authorized for detection and/or diagnosis of SARS-CoV-2 by FDA under an Emergency Use Authorization (EUA). This EUA will remain in effect (meaning this test can be used) for the duration of the COVID-19 declaration under Section 564(b)(1) of the Act, 21 U.S.C. section 360bbb-3(b)(1), unless the authorization is terminated or revoked.     Resp Syncytial Virus by PCR NEGATIVE NEGATIVE Final    Comment: (NOTE) Fact Sheet for Patients: BloggerCourse.com  Fact Sheet for Healthcare Providers: SeriousBroker.it  This test is not yet approved or cleared by the [place] FDA and has been authorized for detection and/or diagnosis of SARS-CoV-2 by FDA under an Emergency  Use Authorization (EUA). This EUA will remain in effect (meaning this test can be used) for the duration of the COVID-19 declaration under Section 564(b)(1) of the Act, 21 U.S.C. section 360bbb-3(b)(1), unless the authorization is terminated or revoked.  Performed at [facility], [address] [place], [place] [address]     Labs: CBC: Recent Labs  Lab 12/12/22 0749  WBC 7.0  NEUTROABS 4.8  HGB 14.4  HCT 44.9  MCV 96.6  PLT 299   Basic Metabolic Panel: Recent Labs  Lab 12/12/22 0749  NA 139  K 4.5  CL 108  CO2 21*  GLUCOSE 108*  BUN 18  CREATININE 1.19  CALCIUM 7.6*   Liver Function Tests: Recent Labs  Lab 12/12/22 0749  AST 27  ALT 20  ALKPHOS 65  BILITOT 0.9  PROT 7.5  ALBUMIN 4.1     Discharge time spent: greater than 30 minutes.  Signed: Alford Highland, MD [facility] 12/15/2022

## 2022-12-15 NOTE — Progress Notes (Signed)
Inpatient Rehabilitation Center Individual Statement of Services  Patient Name:  Clayton [name]  Date:  12/15/2022  Welcome to the Inpatient Rehabilitation Center.  Our goal is to provide you with an individualized program based on your diagnosis and situation, designed to meet your specific needs.  With this comprehensive rehabilitation program, you will be expected to participate in at least 3 hours of rehabilitation therapies Monday-Friday, with modified therapy programming on the weekends.  Your rehabilitation program will include the following services:  Physical Therapy (PT), Occupational Therapy (OT), Speech Therapy (ST), 24 hour per day rehabilitation nursing, Therapeutic Recreaction (TR), Neuropsychology, Care Coordinator, Rehabilitation Medicine, Nutrition Services, Pharmacy Services, and Other  Weekly team conferences will be held on Wednesdays to discuss your progress.  Your Inpatient Rehabilitation Care Coordinator will talk with you frequently to get your input and to update you on team discussions.  Team conferences with you and your family in attendance may also be held.  Expected length of stay: 10-12 Days  Overall anticipated outcome:  Supervision  Depending on your progress and recovery, your program may change. Your Inpatient Rehabilitation Care Coordinator will coordinate services and will keep you informed of any changes. Your Inpatient Rehabilitation Care Coordinator's name and contact numbers are listed  below.  The following services may also be recommended but are not provided by the Inpatient Rehabilitation Center:   Home Health Rehabiltiation Services Outpatient Rehabilitation Services  Arrangements will be made to provide these services after discharge if needed.  Arrangements include referral to agencies that provide these services.  Your insurance has been verified to be:   [employer] Your primary doctor is:  Elizabeth Sauer, MD  Pertinent information  will be shared with your doctor and your insurance company.  Inpatient Rehabilitation Care Coordinator:  Lavera Guise, [place] [phone] or [phone]  Information discussed with and copy given to patient by: Andria Rhein, 12/15/2022, 3:26 PM

## 2022-12-16 DIAGNOSIS — I63331 Cerebral infarction due to thrombosis of right posterior cerebral artery: Secondary | ICD-10-CM | POA: Diagnosis not present

## 2022-12-16 LAB — COMPREHENSIVE METABOLIC PANEL
ALT: 21 U/L (ref 0–44)
AST: 21 U/L (ref 15–41)
Albumin: 3.5 g/dL (ref 3.5–5.0)
Alkaline Phosphatase: 59 U/L (ref 38–126)
Anion gap: 9 (ref 5–15)
BUN: 25 mg/dL — ABNORMAL HIGH (ref 8–23)
CO2: 22 mmol/L (ref 22–32)
Calcium: 8.6 mg/dL — ABNORMAL LOW (ref 8.9–10.3)
Chloride: 106 mmol/L (ref 98–111)
Creatinine, Ser: 1.31 mg/dL — ABNORMAL HIGH (ref 0.61–1.24)
GFR, Estimated: 53 mL/min — ABNORMAL LOW (ref >60)
Glucose, Bld: 119 mg/dL — ABNORMAL HIGH (ref 70–99)
Potassium: 3.9 mmol/L (ref 3.5–5.1)
Sodium: 137 mmol/L (ref 135–145)
Total Bilirubin: 1.1 mg/dL (ref 0.3–1.2)
Total Protein: 6.8 g/dL (ref 6.5–8.1)

## 2022-12-16 LAB — CBC WITH DIFFERENTIAL/PLATELET
Abs Immature Granulocytes: 0.02 10*3/uL (ref 0.00–0.07)
Basophils Absolute: 0.1 10*3/uL (ref 0.0–0.1)
Basophils Relative: 1 %
Eosinophils Absolute: 0.1 10*3/uL (ref 0.0–0.5)
Eosinophils Relative: 1 %
HCT: 44 % (ref 39.0–52.0)
Hemoglobin: 15 g/dL (ref 13.0–17.0)
Immature Granulocytes: 0 %
Lymphocytes Relative: 22 %
Lymphs Abs: 1.8 10*3/uL (ref 0.7–4.0)
MCH: 32.1 pg (ref 26.0–34.0)
MCHC: 34.1 g/dL (ref 30.0–36.0)
MCV: 94 fL (ref 80.0–100.0)
Monocytes Absolute: 0.9 10*3/uL (ref 0.1–1.0)
Monocytes Relative: 11 %
Neutro Abs: 5.4 10*3/uL (ref 1.7–7.7)
Neutrophils Relative %: 65 %
Platelets: 281 10*3/uL (ref 150–400)
RBC: 4.68 MIL/uL (ref 4.22–5.81)
RDW: 13.9 % (ref 11.5–15.5)
WBC: 8.3 10*3/uL (ref 4.0–10.5)
nRBC: 0 % (ref 0.0–0.2)

## 2022-12-16 NOTE — Progress Notes (Signed)
Inpatient Rehabilitation  Patient information reviewed and entered into eRehab system by Melissa M. Bowie, M.A., CCC/SLP, PPS Coordinator.  Information including medical coding, functional ability and quality indicators will be reviewed and updated through discharge.    

## 2022-12-16 NOTE — Progress Notes (Signed)
Patient ID: Clayton [name], male   DOB: 08/06/1934, 88 y.o.   MRN: [id number] Met with the patient to review current situation, rehab process, team conference and plan of care. Patient reports wife will be in later. Fidgets and pulls off ZIO patch; replaced patch and explained rationale for leaving it on and phone nearby.  Currently with telesitter for safety but patient reports he slept well, does not recall restlessness. Reports he and wife cook and he managed his own meds PTA.  Reviewed medications and dietary modifications for Heart Healthy diet and assisted patient to order his meals. Continue to follow along to address educational needs to facilitate preparation for discharge. Pamelia Hoit

## 2022-12-16 NOTE — Plan of Care (Signed)
  Problem: RH Balance Goal: LTG Patient will maintain dynamic standing with ADLs (OT) Description: LTG:  Patient will maintain dynamic standing balance with assist during activities of daily living (OT)  Flowsheets (Taken 12/16/2022 0928) LTG: Pt will maintain dynamic standing balance during ADLs with: Independent with assistive device   Problem: Sit to Stand Goal: LTG:  Patient will perform sit to stand in prep for activites of daily living with assistance level (OT) Description: LTG:  Patient will perform sit to stand in prep for activites of daily living with assistance level (OT) Flowsheets (Taken 12/16/2022 0928) LTG: PT will perform sit to stand in prep for activites of daily living with assistance level: Independent with assistive device   Problem: RH Grooming Goal: LTG Patient will perform grooming w/assist,cues/equip (OT) Description: LTG: Patient will perform grooming with assist, with/without cues using equipment (OT) Flowsheets (Taken 12/16/2022 0928) LTG: Pt will perform grooming with assistance level of: Independent with assistive device    Problem: RH Bathing Goal: LTG Patient will bathe all body parts with assist levels (OT) Description: LTG: Patient will bathe all body parts with assist levels (OT) Flowsheets (Taken 12/16/2022 0928) LTG: Pt will perform bathing with assistance level/cueing: Supervision/Verbal cueing   Problem: RH Dressing Goal: LTG Patient will perform upper body dressing (OT) Description: LTG Patient will perform upper body dressing with assist, with/without cues (OT). Flowsheets (Taken 12/16/2022 0928) LTG: Pt will perform upper body dressing with assistance level of: Independent with assistive device Goal: LTG Patient will perform lower body dressing w/assist (OT) Description: LTG: Patient will perform lower body dressing with assist, with/without cues in positioning using equipment (OT) Flowsheets (Taken 12/16/2022 0928) LTG: Pt will perform lower body  dressing with assistance level of: Independent with assistive device   Problem: RH Toileting Goal: LTG Patient will perform toileting task (3/3 steps) with assistance level (OT) Description: LTG: Patient will perform toileting task (3/3 steps) with assistance level (OT)  Flowsheets (Taken 12/16/2022 0928) LTG: Pt will perform toileting task (3/3 steps) with assistance level: Independent with assistive device   Problem: RH Functional Use of Upper Extremity Goal: LTG Patient will use RT/LT upper extremity as a (OT) Description: LTG: Patient will use right/left upper extremity as a stabilizer/gross assist/diminished/nondominant/dominant level with assist, with/without cues during functional activity (OT) Flowsheets (Taken 12/16/2022 0928) LTG: Use of upper extremity in functional activities: LUE as nondominant level   Problem: RH Toilet Transfers Goal: LTG Patient will perform toilet transfers w/assist (OT) Description: LTG: Patient will perform toilet transfers with assist, with/without cues using equipment (OT) Flowsheets (Taken 12/16/2022 0928) LTG: Pt will perform toilet transfers with assistance level of: Independent with assistive device   Problem: RH Tub/Shower Transfers Goal: LTG Patient will perform tub/shower transfers w/assist (OT) Description: LTG: Patient will perform tub/shower transfers with assist, with/without cues using equipment (OT) Flowsheets (Taken 12/16/2022 0928) LTG: Pt will perform tub/shower stall transfers with assistance level of: Supervision/Verbal cueing   Problem: RH Attention Goal: LTG Patient will demonstrate this level of attention during functional activites (OT) Description: LTG:  Patient will demonstrate this level of attention during functional activites  (OT) Flowsheets (Taken 12/16/2022 0928) LTG: Patient will demonstrate this level of attention during functional activites (OT): Supervision

## 2022-12-16 NOTE — IPOC Note (Signed)
Overall Plan of Care [facility]) Patient Details Name: Clayton [name] MRN: [id number] DOB: [birth year]  Admitting Diagnosis: Stroke (cerebrum) [facility])  Hospital Problems: Principal Problem:   Stroke (cerebrum) (HCC)     Functional Problem List: Nursing Endurance, Medication Management, Safety  PT Balance, Behavior, Endurance, Motor, Perception, Safety, Sensory, Skin Integrity  OT Balance, Cognition, Endurance, Motor, Perception, Safety  SLP Cognition, Linguistic  TR         Basic ADL's: OT Grooming, Bathing, Dressing, Toileting     Advanced  ADL's: OT       Transfers: PT Bed Mobility, Bed to Chair, Car, Occupational psychologist, Research scientist (life sciences): PT Ambulation, Stairs     Additional Impairments: OT Fuctional Use of Upper Extremity  SLP Swallowing, Social Cognition   Problem Solving, Memory, Attention, Awareness  TR      Anticipated Outcomes Item Anticipated Outcome  Self Feeding    Swallowing  min A   Basic self-care  Mod I  Toileting  Mod I   Bathroom Transfers Mod i/supervision  Bowel/Bladder  n/a  Transfers  supervision  Locomotion  supervision  Communication     Cognition  mod A  Pain  n/a  Safety/Judgment  manage w cues   Therapy Plan: PT Intensity: Minimum of 1-2 x/day ,45 to 90 minutes PT Frequency: 5 out of 7 days PT Duration Estimated Length of Stay: 8-10 days OT Intensity: Minimum of 1-2 x/day, 45 to 90 minutes OT Frequency: 5 out of 7 days OT Duration/Estimated Length of Stay: 7-10 days SLP Intensity: Minumum of 1-2 x/day, 30 to 90 minutes SLP Frequency: 1 to 3 out of 7 days SLP Duration/Estimated Length of Stay: 7-10   Team Interventions: Nursing Interventions Disease Management/Prevention, Medication Management, Discharge Planning, Patient/Family Education  PT interventions Ambulation/gait training, Community reintegration, DME/adaptive equipment instruction, Neuromuscular re-education, Psychosocial support, Stair training,  UE/LE Strength taining/ROM, UE/LE Coordination activities, Therapeutic Activities, Skin care/wound management, Pain management, Discharge planning, Warden/ranger, Cognitive remediation/compensation, Disease management/prevention, Functional mobility training, Patient/family education, Visual/perceptual remediation/compensation, Therapeutic Exercise  OT Interventions Warden/ranger, Cognitive remediation/compensation, Community reintegration, Discharge planning, Disease mangement/prevention, DME/adaptive equipment instruction, Functional electrical stimulation, Functional mobility training, Neuromuscular re-education, Pain management, Patient/family education, Psychosocial support, Self Care/advanced ADL retraining, Skin care/wound managment, Splinting/orthotics, Therapeutic Activities, Therapeutic Exercise, UE/LE Strength taining/ROM, UE/LE Coordination activities, Visual/perceptual remediation/compensation, Wheelchair propulsion/positioning  SLP Interventions Cognitive remediation/compensation, Environmental controls, Cueing hierarchy, Functional tasks, Therapeutic Activities, Internal/external aids, Dysphagia/aspiration precaution training, Patient/family education  TR Interventions    SW/CM Interventions Discharge Planning, Psychosocial Support, Patient/Family Education, Disease Management/Prevention   Barriers to Discharge MD  Behavior  Nursing Decreased caregiver support, Home environment access/layout 1 level 8 ste left rail w spouse  PT Inaccessible home environment, Decreased caregiver support, Lack of/limited family support, Community education officer for SNF coverage, Behavior    OT      SLP      SW Lack of/limited family support, Community education officer for SNF coverage     Team Discharge Planning: Destination: PT-Home ,OT- Home , SLP-Home Projected Follow-up: PT-Home health PT, Outpatient PT, 24 hour supervision/assistance, OT-  Outpatient OT, SLP-Home Health SLP Projected Equipment  Needs: PT-To be determined, OT- To be determined, SLP-None recommended by SLP Equipment Details: PT- , OT-  Patient/family involved in discharge planning: PT- Patient, Family member/caregiver,  OT-Patient, SLP-Patient  MD ELOS: 7-10d Medical Rehab Prognosis:  Good Assessment: The patient has been admitted for CIR therapies with the diagnosis of Right PCA infarct. The team will be addressing functional  mobility, strength, stamina, balance, safety, adaptive techniques and equipment, self-care, bowel and bladder mgt, patient and caregiver education, anxiety and sundowning management. Goals have been set at supervision/mod I. Anticipated discharge destination is home.        See Team Conference Notes for weekly updates to the plan of care

## 2022-12-16 NOTE — Evaluation (Signed)
Physical Therapy Assessment and Plan  Patient Details  Name: Clayton [name] MRN: [id number] Date of Birth: 05-15-1934  PT Diagnosis: Difficulty walking, Hemiparesis non-dominant, Impaired cognition, Impaired sensation, and Muscle weakness Rehab Potential: Good ELOS: 8-10 days   Today's Date: 12/16/2022 PT Individual Time: 1302-1430 PT Individual Time Calculation (min): 88 min    Hospital Problem: Principal Problem:   Stroke (cerebrum) [facility])   Past Medical History:  Past Medical History:  Diagnosis Date   Acute gastrointestinal hemorrhage    Acute hypoxemic respiratory failure (HCC) 05/25/2022   Acute thrombotic stroke (HCC) 12/14/2022   Basal cell carcinoma    Blood in stool 08/30/2018   Blood transfusion without reported diagnosis 13 (age 55)   After traumatic brain injury   Broken ribs    2 BROKEN ON RIBS   CKD (chronic kidney disease)    PT DENIES   Color blindness    DVT (deep venous thrombosis) (HCC)    RIGHT LEG   Dyslexia    Fracture dislocation of wrist    RIGHT   GI bleed 09/02/2018   Hiatal hernia with GERD    HTN (hypertension)    Hypokalemia 05/26/2022   Hyponatremia 05/25/2022   Symptomatic anemia 09/03/2018   TBI (traumatic brain injury) (HCC)    w/skull Fx and crani in 1960s   Past Surgical History:  Past Surgical History:  Procedure Laterality Date   COLONOSCOPY  2014   cleared for 5 yrs   COLONOSCOPY N/A 09/01/2018   Procedure: COLONOSCOPY;  Surgeon: Pasty Spillers, MD;  Location: [facility] ENDOSCOPY;  Service: Endoscopy;  Laterality: N/A;   CRANIECTOMY FOR DEPRESSED SKULL FRACTURE  1960'S   ESOPHAGOGASTRODUODENOSCOPY N/A 09/01/2018   Procedure: ESOPHAGOGASTRODUODENOSCOPY (EGD);  Surgeon: Pasty Spillers, MD;  Location: [facility] ENDOSCOPY;  Service: Endoscopy;  Laterality: N/A;   HERNIA REPAIR     X2   IVC FILTER REMOVAL N/A 07/03/2017   Procedure: IVC FILTER REMOVAL;  Surgeon: Annice Needy, MD;  Location: [facility] INVASIVE CV LAB;   Service: Cardiovascular;  Laterality: N/A;   LUMBAR LAMINECTOMY/DECOMPRESSION MICRODISCECTOMY N/A 04/20/2020   Procedure: L2/3, L3/4 LAMINECTOMY;  Surgeon: Lucy Chris, MD;  Location: [facility] ORS;  Service: Neurosurgery;  Laterality: N/A;  2ND CASE   PERIPHERAL VASCULAR THROMBECTOMY Right 05/22/2017   Procedure: PERIPHERAL VASCULAR THROMBECTOMY;  Surgeon: Annice Needy, MD;  Location: [facility] INVASIVE CV LAB;  Service: Cardiovascular;  Laterality: Right;   SPINE SURGERY  December 2021   TONSILLECTOMY      Assessment & Plan Clinical Impression: Patient is a 88 y.o. male with history of GIB, RLE-DVT, HTN in the past?, TBI w/skull Fx/crani '61, cognitive impairment; who was admitted on 12/12/22 to [facility] with reports of weakness, tendency to bump into things and confusion X one day as well as reports of multiple falls X a week. CTA head showed abrupt occlusion R-PCA in P1 segment with atherosclerotic disease bilateral carotid bifurcations, R-VA 30-50% stenosis and 50% stenosis L-VA as well as 30% stenosis left proximal SCA. MRI brain showed small acute to subacute infarcts in R-PCA and generalized brain atrophy. 2D echo showed EF 55-60% with mild calcification of aortic valve. Dr. Amada Jupiter felt that stroke was thrombotic and recommended Plavix for secondary stroke prevention. He has had issues with agitation and Seroquel was added to help improve sleep. Progress note from 08/13 reports that Zio patch was placed by ???. Wife reports that patient has pulled that off multiple times and it was off this am. Therapy evaluations done  revealing L-HH with left inattention and tendency to bump into items on the left, decreased coordination LUE, difficulty following commands, cognitive deficits,balance deficits and poor safety awareness. CIR recommended due to functional decline.  Patient transferred to CIR on 12/15/2022 .   Patient currently requires min assist with mobility secondary to {impairments:3041632}.  Prior to  hospitalization, patient was independent  with mobility and lived with Spouse in a House home.  Home access is 7 low height steps orStairs to enter.  Patient will benefit from skilled PT intervention to maximize safe functional mobility, minimize fall risk, and decrease caregiver burden for planned discharge home with 24 hour supervision.  Anticipate patient will benefit from follow up OP at discharge.  PT - End of Session Activity Tolerance: Tolerates 30+ min activity with multiple rests Endurance Deficit: Yes Endurance Deficit Description: rest breaks throughout session for energy conservation PT Assessment Rehab Potential (ACUTE/IP ONLY): Good PT Barriers to Discharge: Inaccessible home environment;Decreased caregiver support;Lack of/limited family support;Insurance for SNF coverage;Behavior PT Patient demonstrates impairments in the following area(s): Balance;Behavior;Endurance;Motor;Perception;Safety;Sensory;Skin Integrity PT Transfers Functional Problem(s): Bed Mobility;Bed to Chair;Car;Furniture PT Locomotion Functional Problem(s): Ambulation;Stairs PT Plan PT Intensity: Minimum of 1-2 x/day ,45 to 90 minutes PT Frequency: 5 out of 7 days PT Duration Estimated Length of Stay: 8-10 days PT Treatment/Interventions: Ambulation/gait training;Community reintegration;DME/adaptive equipment instruction;Neuromuscular re-education;Psychosocial support;Stair training;UE/LE Strength taining/ROM;UE/LE Coordination activities;Therapeutic Activities;Skin care/wound management;Pain management;Discharge planning;Balance/vestibular training;Cognitive remediation/compensation;Disease management/prevention;Functional mobility training;Patient/family education;Visual/perceptual remediation/compensation;Therapeutic Exercise PT Transfers Anticipated Outcome(s): supervision PT Locomotion Anticipated Outcome(s): supervision PT Recommendation Recommendations for Other Services: Therapeutic Recreation  consult Therapeutic Recreation Interventions: Warehouse manager;Outing/community reintergration Follow Up Recommendations: Home health PT;Outpatient PT;24 hour supervision/assistance Patient destination: Home Equipment Recommended: To be determined   PT Evaluation Precautions/Restrictions Precautions Precautions: Fall Precaution Comments: mild L inattention to surroundings Restrictions Weight Bearing Restrictions: No General   Vital Signs  Pain Pain Assessment Pain Scale: 0-10 Pain Score: 0-No pain Pain Interference Pain Interference Pain Effect on Sleep: 1. Rarely or not at all Pain Interference with Therapy Activities: 1. Rarely or not at all Pain Interference with Day-to-Day Activities: 1. 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; up until two weeks ago he was going ot his art studio 5-6 x a week laocated in backyard  Lives With: Spouse Prior Function Level of Independence: Independent with homemaking with ambulation;Independent with basic ADLs;Independent with gait;Independent with transfers  Able to Take Stairs?: Reciprically Driving: No Vocation: Retired (retired Personnel officer, now is an Tree surgeon) Vision/Perception  Vision - History Ability to See in Adequate Light: 1 Impaired (plus has dyslexia since BI and per wife does not prefer written language) Perception Perception: Impaired Preception Impairment Details: Inattention/Neglect Praxis Praxis: WFL  Cognition Overall Cognitive Status: Impaired/Different from baseline Arousal/Alertness:  Awake/alert Orientation Level: Oriented to person Focused Attention: Impaired Sustained Attention: Impaired Memory: Impaired Memory Impairment: Storage deficit;Retrieval deficit;Decreased recall of new information;Decreased short term memory Awareness: Impaired Problem Solving: Impaired Executive Function: Self Correcting;Self Monitoring;Initiating Initiating: Impaired Self Monitoring: Impaired Self Correcting: Impaired Safety/Judgment: Impaired Sensation Sensation Light Touch: Impaired by gross assessment (Bil lower limb neuropathy) Coordination Gross Motor Movements are Fluid and Coordinated: No Fine Motor Movements are Fluid and  Coordinated: No Coordination and Movement Description: decreased smoothness and accuracy with L hand Heel Shin Test: [facility] but slow to perform Motor  Motor Motor: Hemiplegia Motor - Skilled Clinical Observations: decreased smoothness and accuracy LUE>LLE   Trunk/Postural Assessment  Cervical Assessment Cervical Assessment: Exceptions to [facility] (forward head) Thoracic Assessment Thoracic Assessment: Exceptions to [facility] (rounded shoulders) Lumbar Assessment Lumbar Assessment: Exceptions to [facility] (slight posterior pelvic tilt) Postural Control Postural Control: Deficits on evaluation (posterior bias in stance with limited self correction fto maintain balance)  Balance Balance Balance Assessed: Yes Standardized Balance Assessment Standardized Balance Assessment: Berg Balance Test Berg Balance Test Sit to Stand: Able to stand  independently using hands Standing Unsupported: Able to stand 30 seconds unsupported Sitting with Back Unsupported but Feet Supported on Floor or Stool: Able to sit safely and securely 2 minutes Stand to Sit: Uses backs of legs against chair to control descent Transfers: Able to transfer with verbal cueing and /or supervision Standing Unsupported with Eyes Closed: Able to stand 10 seconds with supervision Standing Ubsupported with  Feet Together: Able to place feet together independently and stand for 1 minute with supervision From Standing, Reach Forward with Outstretched Arm: Can reach forward >12 cm safely (5") From Standing Position, Pick up Object from Floor: Able to pick up shoe, needs supervision From Standing Position, Turn to Look Behind Over each Shoulder: Turn sideways only but maintains balance Turn 360 Degrees: Able to turn 360 degrees safely but slowly Standing Unsupported, Alternately Place Feet on Step/Stool: Able to complete >2 steps/needs minimal assist Standing Unsupported, One Foot in Front: Able to take small step independently and hold 30 seconds Standing on One Leg: Unable to try or needs assist to prevent fall Total Score: 32 Static Sitting Balance Static Sitting - Balance Support: Feet supported Static Sitting - Level of Assistance: 5: Stand by assistance Dynamic Sitting Balance Dynamic Sitting - Balance Support: Feet supported;During functional activity Dynamic Sitting - Level of Assistance: 5: Stand by assistance Static Standing Balance Static Standing - Balance Support: During functional activity Static Standing - Level of Assistance: 4: Min assist Dynamic Standing Balance Dynamic Standing - Balance Support: During functional activity Dynamic Standing - Level of Assistance: 4: Min assist Extremity Assessment      RLE Assessment RLE Assessment: Exceptions to [facility] General Strength Comments: grossly 4- to 4/ 5 prox to distal LLE Assessment LLE Assessment: Exceptions to [facility] General Strength Comments: grossly 3+/5 to 4-/ 5 proximal to distal  Care Tool Care Tool Bed Mobility Roll left and right activity   Roll left and right assist level: Supervision/Verbal cueing    Sit to lying activity   Sit to lying assist level: Contact Guard/Touching assist    Lying to sitting on side of bed activity   Lying to sitting on side of bed assist level: the ability to move from lying on the back to  sitting on the side of the bed with no back support.: Supervision/Verbal cueing     Care Tool Transfers Sit to stand transfer   Sit to stand assist level: Contact Guard/Touching assist    Chair/bed transfer   Chair/bed transfer assist level: Minimal Assistance - Patient > 75%     Toilet transfer   Assist Level: Minimal Assistance - Patient > 75%    Car transfer   Car transfer assist level: Contact Guard/Touching assist;Minimal Assistance - Patient > 75%      Care Tool Locomotion Ambulation   Assist level: Contact Guard/Touching assist Assistive device: Walker-rolling  Max distance: 165 ft  Walk 10 feet activity   Assist level: Contact Guard/Touching assist Assistive device: Walker-rolling   Walk 50 feet with 2 turns activity   Assist level: Contact Guard/Touching assist Assistive device: Walker-rolling  Walk 150 feet activity   Assist level: Contact Guard/Touching assist Assistive device: Walker-rolling  Walk 10 feet on uneven surfaces activity Walk 10 feet on uneven surfaces activity did not occur: Safety/medical concerns      Stairs   Assist level: Contact Guard/Touching assist Stairs assistive device: 2 hand rails Max number of stairs: 8  Walk up/down 1 step activity   Walk up/down 1 step (curb) assist level: Contact Guard/Touching assist Walk up/down 1 step or curb assistive device: 2 hand rails  Walk up/down 4 steps activity   Walk up/down 4 steps assist level: Contact Guard/Touching assist Walk up/down 4 steps assistive device: 2 hand rails  Walk up/down 12 steps activity Walk up/down 12 steps activity did not occur: Safety/medical concerns      Pick up small objects from floor   Pick up small object from the floor assist level: Contact Guard/Touching assist;Minimal Assistance - Patient > 75%    Wheelchair Is the patient using a wheelchair?: Yes Type of Wheelchair: Manual   Wheelchair assist level: Dependent - Patient 0% Max wheelchair distance: 300 ft   Wheel 50 feet with 2 turns activity   Assist Level: Dependent - Patient 0%  Wheel 150 feet activity   Assist Level: Dependent - Patient 0%    Refer to Care Plan for Long Term Goals  SHORT TERM GOAL WEEK 1 PT Short Term Goal 1 (Week 1): STG = LTG d/t ELOS  Recommendations for other services: Therapeutic Recreation  Stress management and Outing/community reintegration  Skilled Therapeutic Intervention Mobility Bed Mobility Bed Mobility: Supine to Sit;Sit to Supine Supine to Sit: Supervision/Verbal cueing Sit to Supine: Supervision/Verbal cueing Transfers Transfers: Stand to Sit;Sit to Stand;Stand Pivot Transfers Sit to Stand: Contact Guard/Touching assist Stand to Sit: Contact Guard/Touching assist Stand Pivot Transfers: Minimal Assistance - Patient > 75% Transfer (Assistive device): Rolling walker Locomotion  Gait Ambulation: Yes Gait Assistance: Contact Guard/Touching assist;Minimal Assistance - Patient > 75% Gait Distance (Feet): 165 Feet Assistive device: Rolling walker Gait Assistance Details: Tactile cues for posture;Verbal cues for precautions/safety;Verbal cues for safe use of DME/AE;Verbal cues for gait pattern;Verbal cues for technique Gait Gait: Yes Gait Pattern: Impaired Ankle - Swing Phase- Impaired Gait Pattern: Decreased foot clearance - Left Knee - Swing Phase- Impaired Gait Pattern: Decreased flexion - Left Hip - Swing Phase- Impaired Gait Pattern: Decreased flexion - Left Gait velocity: decreased Stairs / Additional Locomotion Stairs: Yes Stairs Assistance: Contact Guard/Touching assist Stair Management Technique: Two rails Number of Stairs: 8 Height of Stairs: 6 Wheelchair Mobility Wheelchair Mobility: No   Discharge Criteria: Patient will be discharged from PT if patient refuses treatment 3 consecutive times without medical reason, if treatment goals not met, if there is a change in medical status, if patient makes no progress towards goals or if  patient is discharged from hospital.  The above assessment, treatment plan, treatment alternatives and goals were discussed and mutually agreed upon: by patient and by family  Loel Dubonnet PT, DPT, CSRS 12/16/2022, 6:43 PM

## 2022-12-16 NOTE — Progress Notes (Signed)
Inpatient Rehabilitation Care Coordinator Assessment and Plan Patient Details  Name: Clayton [name] MRN: [id number] Date of Birth: Jan 17, 1935  Today's Date: 12/16/2022  Hospital Problems: Principal Problem:   Stroke (cerebrum) [facility])  Past Medical History:  Past Medical History:  Diagnosis Date   Acute gastrointestinal hemorrhage    Acute hypoxemic respiratory failure (HCC) 05/25/2022   Acute thrombotic stroke (HCC) 12/14/2022   Basal cell carcinoma    Blood in stool 08/30/2018   Blood transfusion without reported diagnosis 60 (age 15)   After traumatic brain injury   Broken ribs    2 BROKEN ON RIBS   CKD (chronic kidney disease)    PT DENIES   Color blindness    DVT (deep venous thrombosis) (HCC)    RIGHT LEG   Dyslexia    Fracture dislocation of wrist    RIGHT   GI bleed 09/02/2018   Hiatal hernia with GERD    HTN (hypertension)    Hypokalemia 05/26/2022   Hyponatremia 05/25/2022   Symptomatic anemia 09/03/2018   TBI (traumatic brain injury) (HCC)    w/skull Fx and crani in 1960s   Past Surgical History:  Past Surgical History:  Procedure Laterality Date   COLONOSCOPY  2014   cleared for 5 yrs   COLONOSCOPY N/A 09/01/2018   Procedure: COLONOSCOPY;  Surgeon: Pasty Spillers, MD;  Location: [facility] ENDOSCOPY;  Service: Endoscopy;  Laterality: N/A;   CRANIECTOMY FOR DEPRESSED SKULL FRACTURE  1960'S   ESOPHAGOGASTRODUODENOSCOPY N/A 09/01/2018   Procedure: ESOPHAGOGASTRODUODENOSCOPY (EGD);  Surgeon: Pasty Spillers, MD;  Location: [facility] ENDOSCOPY;  Service: Endoscopy;  Laterality: N/A;   HERNIA REPAIR     X2   IVC FILTER REMOVAL N/A 07/03/2017   Procedure: IVC FILTER REMOVAL;  Surgeon: Annice Needy, MD;  Location: [facility] INVASIVE CV LAB;  Service: Cardiovascular;  Laterality: N/A;   LUMBAR LAMINECTOMY/DECOMPRESSION MICRODISCECTOMY N/A 04/20/2020   Procedure: L2/3, L3/4 LAMINECTOMY;  Surgeon: Lucy Chris, MD;  Location: [facility] ORS;  Service: Neurosurgery;   Laterality: N/A;  2ND CASE   PERIPHERAL VASCULAR THROMBECTOMY Right 05/22/2017   Procedure: PERIPHERAL VASCULAR THROMBECTOMY;  Surgeon: Annice Needy, MD;  Location: [facility] INVASIVE CV LAB;  Service: Cardiovascular;  Laterality: Right;   SPINE SURGERY  December 2021   TONSILLECTOMY     Social History:  reports that he quit smoking about 64 years ago. His smoking use included cigarettes. He started smoking about 67 years ago. He has a 1.7 pack-year smoking history. He has never used smokeless tobacco. He reports current alcohol use of about 3.0 standard drinks of alcohol per week. He reports that he does not use drugs.  Family / Support Systems Marital Status: Married How Long?: n/a Patient Roles: Spouse Spouse/Significant Other: Tax adviser Children: n/a Other Supports: n/a Anticipated Caregiver: [name], spouse Ability/Limitations of Caregiver: Min A Caregiver Availability: 24/7 Family Dynamics: Support from spouse  Social History Preferred language: English Religion: Protestant Cultural Background: Independent and ennoughs time with his spouse and completing art. Education: Some Charity fundraiser - How often do you need to have someone help you when you read instructions, pamphlets, or other written material from your doctor or pharmacy?: Sometimes Writes: Yes Legal History/Current Legal Issues: n/a Guardian/Conservator: n/a   Abuse/Neglect Abuse/Neglect Assessment Can Be Completed: Yes Physical Abuse: Denies Verbal Abuse: Denies Sexual Abuse: Denies Exploitation of patient/patient's resources: Denies Self-Neglect: Denies  Patient response to: Social Isolation - How often do you feel lonely or isolated from those around you?: Never  Emotional  Status Pt's affect, behavior and adjustment status: Pleasant and fidgity inquiring are spouse. Spouse will be present after therapies. 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 Anticipated changes in roles/activities/participation: Spouse anticipates assisting pt at d/c with roles and tasks Pt/family expectations/goals: supervision  [employer] Agencies: None Premorbid Home Care/DME Agencies: Other (Comment) ([facility], [facility], Information systems manager) Transportation available at discharge: spouse Is the patient able to respond to transportation needs?: Yes In the past 12 months, has lack of transportation kept you from medical appointments or from getting medications?: No In the past 12 months, has lack of transportation kept you from meetings, work, or from getting things needed for daily living?: No Resource referrals recommended: Neuropsychology  Discharge Planning Living Arrangements: Spouse/significant other Support Systems: Spouse/significant other Type of Residence: Private residence (8 steps) Financial Resources: Tree surgeon, Family Support Financial Screen Referred: No Living Expenses: Own Money Management: Patient, Spouse Does the patient have any problems obtaining your medications?: No Home Management: Spouse managing cognitive tasks Patient/Family Preliminary Plans: Spouse plans to continue Care Coordinator Barriers to Discharge: Lack of/limited family support, Insurance for SNF coverage Care Coordinator Anticipated Follow Up Needs: HH/OP Expected length of stay: 10-12 Days  Clinical Impression SW met with patient and spouse with spouse via telephone. Sw introduced self and explained role. Patient discharging home with spouse to assist 24/7 up to a Min A level. SW has not met spouse in person, spouse will be present this evening. Spouse shared that she will be present in the evenings after  therapy session to eliminate distractions. Sw will continue to follow up with spouse for updates and questions or concerns.   Andria Rhein 12/16/2022, 2:26 PM

## 2022-12-16 NOTE — Progress Notes (Signed)
PROGRESS NOTE   Subjective/Complaints:  Asking for toothpick or floss, no other c/os  ROS- neg CP, SOB, N/V/D    Objective:   No results found. Recent Labs    12/16/22 0615  WBC 8.3  HGB 15.0  HCT 44.0  PLT 281   Recent Labs    12/16/22 0615  NA 137  K 3.9  CL 106  CO2 22  GLUCOSE 119*  BUN 25*  CREATININE 1.31*  CALCIUM 8.6*    Intake/Output Summary (Last 24 hours) at 12/16/2022 1610 Last data filed at 12/15/2022 2200 Gross per 24 hour  Intake 236 ml  Output 300 ml  Net -64 ml        Physical Exam: Vital Signs Blood pressure 133/76, pulse 69, temperature 98.3 F (36.8 C), temperature source Oral, resp. rate 18, height 5\' 7"  (1.702 m), SpO2 96%.  General: No acute distress Mood and affect are appropriate Heart: Regular rate and rhythm no rubs murmurs or extra sounds Lungs: Clear to auscultation, breathing unlabored, no rales or wheezes Abdomen: Positive bowel sounds, soft nontender to palpation, nondistended Extremities: No clubbing, cyanosis, or edema Skin: No evidence of breakdown, no evidence of rash Neurologic: Cranial nerves II through XII intact, motor strength is 5/5 in bilateral deltoid, bicep, tricep, grip, hip flexor, knee extensors, ankle dorsiflexor and plantar flexor Sensory exam normal sensation to light touch and proprioception in bilateral upper and lower extremities Cerebellar exam normal finger to nose to finger as well as heel to shin in bilateral upper and lower extremities Left homonymous hemianopsia Orient to person and hospital but in "[place]" oriented to time  Musculoskeletal: Full range of motion in all 4 extremities. No joint swelling    Assessment/Plan: 1. Functional deficits which require 3+ hours per day of interdisciplinary therapy in a comprehensive inpatient rehab setting. Physiatrist is providing close team supervision and 24 hour management of active medical  problems listed below. Physiatrist and rehab team continue to assess barriers to discharge/monitor patient progress toward functional and medical goals  Care Tool:  Bathing              Bathing assist       Upper Body Dressing/Undressing Upper body dressing        Upper body assist      Lower Body Dressing/Undressing Lower body dressing            Lower body assist       Toileting Toileting    Toileting assist       Transfers Chair/bed transfer  Transfers assist           Locomotion Ambulation   Ambulation assist              Walk 10 feet activity   Assist           Walk 50 feet activity   Assist           Walk 150 feet activity   Assist           Walk 10 feet on uneven surface  activity   Assist           Wheelchair  Assist               Wheelchair 50 feet with 2 turns activity    Assist            Wheelchair 150 feet activity     Assist          Blood pressure 133/76, pulse 69, temperature 98.3 F (36.8 C), temperature source Oral, resp. rate 18, height 5\' 7"  (1.702 m), SpO2 96%.  Medical Problem List and Plan: 1. Functional deficits secondary to R PCA thrombotic CVA             -patient may shower             -ELOS/Goals: 10-12 days, SPV PT/OT/SLP 2.  Antithrombotics: -DVT/anticoagulation:  Pharmaceutical: Lovenox             -antiplatelet therapy: Plavix 3. Pain Management:  Tylenol prn. 4. Mood/Behavior/Sleep:  LCSW to follow for evaluation and support.              -antipsychotic agents: N/A             -Seroquel for insomnia   5. Neuropsych/cognition: This patient is not capable of making decisions on his own behalf.              - Delirium precautions, telesitter and bed alarm, utilize aides for orientation (hearing aides, glasses)   6. Skin/Wound Care: Routine pressure relief measures.  7. Fluids/Electrolytes/Nutrition: Monitor I/O. Check CMET in am.  8.  HTN: Monitor BP TID--not on any meds PTA. Vitals:   12/15/22 1952 12/16/22 0616  BP: 114/76 133/76  Pulse: 85 69  Resp: 16 18  Temp: 98.3 F (36.8 C) 98.3 F (36.8 C)  SpO2: 96%     9. H/o GIB X 2: Will resume Protonix and monitor stool for occult blood.             --monitor H/H    Latest Ref Rng & Units 12/16/2022    6:15 AM 12/12/2022    7:49 AM 08/04/2022    9:08 AM  CBC  WBC 4.0 - 10.5 K/uL 8.3  7.0  5.9   Hemoglobin 13.0 - 17.0 g/dL 78.2  95.6  21.3   Hematocrit 39.0 - 52.0 % 44.0  44.9  41.2   Platelets 150 - 400 K/uL 281  299  313     10. Anxiety d/o: Continue Buspar bid    11. Cardiac monitoring: Has Zio patch/event monitor. Wife with multiple questions YQ:MVHQ,ION, care.              --No cardiology notes noted--reached out to Dr. Glennis Brink office for input.  --Wait on monitor use till delirium resolves as patient continues to pull it off?    LOS: 1 days A FACE TO FACE EVALUATION WAS PERFORMED  Erick Colace 12/16/2022, 8:23 AM

## 2022-12-16 NOTE — Progress Notes (Addendum)
Patient does not have heart monitor on at this time. Heart monitor placed on pt last evening approx 1800. Pt had heart monitor "phone" in lap and told nurse that was his personal cell phone. Pt disoriented but redirectable by staff.

## 2022-12-16 NOTE — Plan of Care (Signed)
  Problem: Consults Goal: RH STROKE PATIENT EDUCATION Description: See Patient Education module for education specifics  Outcome: Progressing   Problem: RH SAFETY Goal: RH STG ADHERE TO SAFETY PRECAUTIONS W/ASSISTANCE/DEVICE Description: STG Adhere to Safety Precautions With  cues Assistance/Device. Outcome: Progressing   

## 2022-12-16 NOTE — Plan of Care (Signed)
  Problem: RH Swallowing Goal: LTG Patient will consume least restrictive diet using compensatory strategies with assistance (SLP) Description: LTG:  Patient will consume least restrictive diet using compensatory strategies with assistance (SLP) Flowsheets (Taken 12/16/2022 1608) LTG: Pt Patient will consume least restrictive diet using compensatory strategies with assistance of (SLP): Supervision   Problem: RH Cognition - SLP Goal: RH LTG Patient will demonstrate orientation with cues Description:  LTG:  Patient will demonstrate orientation to person/place/time/situation with cues (SLP)   Flowsheets (Taken 12/16/2022 1609) LTG Patient will demonstrate orientation to:  Person  Place  Time  Situation LTG: Patient will demonstrate orientation using cueing (SLP): Minimal Assistance - Patient > 75%   Problem: RH Problem Solving Goal: LTG Patient will demonstrate problem solving for (SLP) Description: LTG:  Patient will demonstrate problem solving for basic/complex daily situations with cues  (SLP) Flowsheets (Taken 12/16/2022 1609) LTG: Patient will demonstrate problem solving for (SLP): Basic daily situations LTG Patient will demonstrate problem solving for: Minimal Assistance - Patient > 75%   Problem: RH Memory Goal: LTG Patient will demonstrate ability for day to day (SLP) Description: LTG:   Patient will demonstrate ability for day to day recall/carryover during cognitive/linguistic activities with assist  (SLP) Flowsheets (Taken 12/16/2022 1609) LTG: Patient will demonstrate ability for day to day recall: New information LTG: Patient will demonstrate ability for day to day recall/carryover during cognitive/linguistic activities with assist (SLP): Minimal Assistance - Patient > 75% Goal: LTG Patient will use memory compensatory aids to (SLP) Description: LTG:  Patient will use memory compensatory aids to recall biographical/new, daily complex information with cues (SLP) Flowsheets (Taken  12/16/2022 1609) LTG: Patient will use memory compensatory aids to (SLP): Minimal Assistance - Patient > 75%   Problem: RH Awareness Goal: LTG: Patient will demonstrate awareness during functional activites type of (SLP) Description: LTG: Patient will demonstrate awareness during functional activites type of (SLP) Flowsheets (Taken 12/16/2022 1609) Patient will demonstrate during cognitive/linguistic activities awareness type of: Intellectual LTG: Patient will demonstrate awareness during cognitive/linguistic activities with assistance of (SLP): Minimal Assistance - Patient > 75%

## 2022-12-16 NOTE — Evaluation (Signed)
Speech Language Pathology Assessment and Plan  Patient Details  Name: Clayton [name] MRN: [id number] Date of Birth: Jan 01, 1935  SLP Diagnosis: Cognitive Impairments;Dysphagia  Rehab Potential: Fair ELOS: 7-10   Today's Date: 12/16/2022 SLP Individual Time: 1101-1200 SLP Individual Time Calculation (min): 59 min  Hospital Problem: Principal Problem:   Stroke (cerebrum) [facility])  Past Medical History:  Past Medical History:  Diagnosis Date   Acute gastrointestinal hemorrhage    Acute hypoxemic respiratory failure (HCC) 05/25/2022   Acute thrombotic stroke (HCC) 12/14/2022   Basal cell carcinoma    Blood in stool 08/30/2018   Blood transfusion without reported diagnosis 24 (age 60)   After traumatic brain injury   Broken ribs    2 BROKEN ON RIBS   CKD (chronic kidney disease)    PT DENIES   Color blindness    DVT (deep venous thrombosis) (HCC)    RIGHT LEG   Dyslexia    Fracture dislocation of wrist    RIGHT   GI bleed 09/02/2018   Hiatal hernia with GERD    HTN (hypertension)    Hypokalemia 05/26/2022   Hyponatremia 05/25/2022   Symptomatic anemia 09/03/2018   TBI (traumatic brain injury) (HCC)    w/skull Fx and crani in 1960s   Past Surgical History:  Past Surgical History:  Procedure Laterality Date   COLONOSCOPY  2014   cleared for 5 yrs   COLONOSCOPY N/A 09/01/2018   Procedure: COLONOSCOPY;  Surgeon: Pasty Spillers, MD;  Location: [facility] ENDOSCOPY;  Service: Endoscopy;  Laterality: N/A;   CRANIECTOMY FOR DEPRESSED SKULL FRACTURE  1960'S   ESOPHAGOGASTRODUODENOSCOPY N/A 09/01/2018   Procedure: ESOPHAGOGASTRODUODENOSCOPY (EGD);  Surgeon: Pasty Spillers, MD;  Location: [facility] ENDOSCOPY;  Service: Endoscopy;  Laterality: N/A;   HERNIA REPAIR     X2   IVC FILTER REMOVAL N/A 07/03/2017   Procedure: IVC FILTER REMOVAL;  Surgeon: Annice Needy, MD;  Location: [facility] INVASIVE CV LAB;  Service: Cardiovascular;  Laterality: N/A;   LUMBAR  LAMINECTOMY/DECOMPRESSION MICRODISCECTOMY N/A 04/20/2020   Procedure: L2/3, L3/4 LAMINECTOMY;  Surgeon: Lucy Chris, MD;  Location: [facility] ORS;  Service: Neurosurgery;  Laterality: N/A;  2ND CASE   PERIPHERAL VASCULAR THROMBECTOMY Right 05/22/2017   Procedure: PERIPHERAL VASCULAR THROMBECTOMY;  Surgeon: Annice Needy, MD;  Location: [facility] INVASIVE CV LAB;  Service: Cardiovascular;  Laterality: Right;   SPINE SURGERY  December 2021   TONSILLECTOMY      Assessment / Plan / Recommendation Clinical Impression Clayton [name] is a 88 y.o. male with medical history significant of cognitive impairment, history of TBI, hypertension presenting with weakness, confusion.  Noted admission February 2024 with multifocal pneumonia that resulted in worsening cognitive impairment per the wife.  He was admitted to [facility] 12/12/22 for stroke work up. Pt. Presented to the ER afebrile, hemodynamically stable.  White count 7, hemoglobin 14.4, platelets 299, COVID flu RSV negative, ammonia level within normal limits, urinalysis not indicative of infection.  CT head within normal limits .  MRI of the brain showed scattered small acute to subacute infarcts in the right PCA distribution.  LDL 98, HDL 39.  A1c 6.1.  Patient is treated with atorvastatin, Plavix.  He previous had reaction to aspirin, could not tolerate.  Decision was made to treat with Plavix for long-term. Echocardiogram was also performed, showed normal ejection fraction without a source of thrombosis. Zio patch to be placed prior to d/c. Pt. Seen by PT,OT, SLP and they recommend CIR to assist  return to PLOF.   Bedside Swallow Evaluation: Patient exhibits symptoms significant for clinical concern for aspiration. SLP administered thin liquids, puree, and regular solids, noting no overt difficulty with solid textures. Patient endorses no difficulty swallowing at baseline. During thin liquids trials from straw, patient exhibited immediate  cough x3. With removal of the straw and intermittent cuing for small sips, patient demonstrated no overt s/sx of penetration/aspiration and subjectively reported it was "going down better". Given concern for aspiration with straw sips and recent history of multifocal pneumonia, recommend instrumental swallow study (MBS) to objectively view any existing oropharyngeal swallowing impairments. Recommend continuation of regular/thin liquid diet until results from this instrumental study can be rendered, with the following modifications: NO STRAWS, SINGLE SIPS, intermittent supervision to cue for these strategies. Meds administered whole in puree. MBS tentatively scheduled for 12/19/22 at 9 AM pending radiology agreement/schedule.   Cognitive-Linguistic Evaluation: Patient exhibits significant cognitive deficits in the areas of orientation, attention, registration, language comprehension (suspect cognitive impact>language-based impact), naming, constructional skills, and memory per COGNISTAT. On cognistat exam, patient demonstrated intact judgement and reasoning, however these cognitive domains were not tested functionally and suspect functional impairment>verbal impairment in these areas. Patient also exhibited significant left inattention which impacted ability to fully engage in language comprehension and naming tasks. During clock drawing task, patient exhibited significant deficits in visuospatial skills and required minA to self-monitor.   Patient would benefit from SLP services during CIR admission. Patient was left in chair with call bell in reach and chair alarm set. SLP will continue to follow during this admission to target cognitive-linguistic and swallowing deficits.    Skilled Therapeutic Interventions          SLP conducted skilled evaluation session including bedside swallow evaluation and administration of the COGNISTAT to determine any cognitive-linguistic deficits. Full results above.    SLP  Assessment  Patient will need skilled Speech Lanaguage Pathology Services during CIR admission    Recommendations  SLP Diet Recommendations: Age appropriate regular solids;Thin Liquid Administration via: Cup Medication Administration: Whole meds with puree Supervision: Intermittent supervision to cue for compensatory strategies Compensations: Minimize environmental distractions;Slow rate;Small sips/bites Postural Changes and/or Swallow Maneuvers: Seated upright 90 degrees;Upright 30-60 min after meal Oral Care Recommendations: Oral care BID Recommendations for Other Services: Neuropsych consult Patient destination: Home Follow up Recommendations: Home Health SLP Equipment Recommended: None recommended by SLP    SLP Frequency 1 to 3 out of 7 days   SLP Duration  SLP Intensity  SLP Treatment/Interventions 7-10  Minumum of 1-2 x/day, 30 to 90 minutes  Cognitive remediation/compensation;Environmental controls;Cueing hierarchy;Functional tasks;Therapeutic Activities;Internal/external aids;Dysphagia/aspiration precaution training;Patient/family education    Pain Pain Assessment Pain Scale: 0-10 Pain Score: 0-No pain  Prior Functioning Cognitive/Linguistic Baseline: Baseline deficits Baseline deficit details: Dementia: memory loss; cognitive impairment Type of Home: House  Lives With: Spouse Available Help at Discharge: Family Education: post HS Vocation: Retired  Architectural technologist Overall Cognitive Status: Impaired/Different from baseline Arousal/Alertness: Awake/alert Orientation Level: Oriented to person Year:  ("I'm not sure") Month:  (no answer) Day of Week: Incorrect Attention: Focused;Sustained Focused Attention: Impaired Focused Attention Impairment: Verbal basic;Functional basic Sustained Attention: Impaired Sustained Attention Impairment: Verbal basic;Functional basic Memory: Impaired Memory Impairment: Storage deficit;Retrieval deficit;Decreased  recall of new information;Decreased short term memory Decreased Short Term Memory: Verbal basic;Functional basic Awareness: Impaired Awareness Impairment: Intellectual impairment;Emergent impairment;Anticipatory impairment Problem Solving: Impaired Problem Solving Impairment: Verbal basic;Functional basic Executive Function: Self Correcting;Self Monitoring;Organizing Organizing: Impaired Organizing Impairment: Verbal basic;Functional basic Self Monitoring: Impaired  Self Monitoring Impairment: Verbal basic;Functional basic Self Correcting: Impaired Self Correcting Impairment: Verbal basic;Functional basic Safety/Judgment: Impaired  Comprehension Auditory Comprehension Overall Auditory Comprehension: Impaired (Suspect 2* cognitive deficits) Yes/No Questions: Within Functional Limits Commands: Impaired Two Step Basic Commands: 25-49% accurate (suspect 2* left inattention and cognitive deficits) Conversation: Simple Interfering Components: Attention;Working memory;Visual impairments EffectiveTechniques: Extra processing time;Increased volume;Repetition;Stressing words Visual Recognition/Discrimination Discrimination: Not tested Reading Comprehension Reading Status: Not tested Functional Environmental (signs, name badge): Impaired Interfering Components: Left neglect/inattention Expression Expression Primary Mode of Expression: Verbal Verbal Expression Overall Verbal Expression: Appears within functional limits for tasks assessed (grossly intact for basic skills, however suspect cognitve impact on language skills) Initiation: No impairment Level of Generative/Spontaneous Verbalization: Conversation Repetition: No impairment Naming: Impairment Responsive: Not tested Confrontation: Impaired Convergent: Not tested Divergent: Not tested Pragmatics: No impairment Interfering Components: Attention Non-Verbal Means of Communication: Not applicable Written Expression Dominant Hand:  Right Written Expression: Not tested Oral Motor Oral Motor/Sensory Function Overall Oral Motor/Sensory Function: Within functional limits Motor Speech Overall Motor Speech: Appears within functional limits for tasks assessed Respiration: Within functional limits Phonation: Low vocal intensity Resonance: Within functional limits Articulation: Within functional limitis Intelligibility: Intelligible Motor Planning: Witnin functional limits Motor Speech Errors: Not applicable Effective Techniques: Increased vocal intensity  Care Tool Care Tool Cognition Ability to hear (with hearing aid or hearing appliances if normally used Ability to hear (with hearing aid or hearing appliances if normally used): 1. Minimal difficulty - difficulty in some environments (e.g. when person speaks softly or setting is noisy)   Expression of Ideas and Wants Expression of Ideas and Wants: 3. Some difficulty - exhibits some difficulty with expressing needs and ideas (e.g, some words or finishing thoughts) or speech is not clear   Understanding Verbal and Non-Verbal Content Understanding Verbal and Non-Verbal Content: 4. Understands (complex and basic) - clear comprehension without cues or repetitions  Memory/Recall Ability Memory/Recall Ability : None of the above were recalled   Bedside Swallowing Assessment General Diet Prior to this Study: Regular;Thin liquids (Level 0) Temperature Spikes Noted: No Respiratory Status: Room air History of Recent Intubation: No Behavior/Cognition: Alert;Cooperative;Pleasant mood;Confused Oral Cavity - Dentition: Poor condition;Missing dentition Self-Feeding Abilities: Able to feed self Patient Positioning: Upright in chair/Tumbleform Baseline Vocal Quality: Low vocal intensity Volitional Cough: Weak Volitional Swallow: Able to elicit  Oral Care Assessment Oral Assessment  (WDL): Within Defined Limits Level of Consciousness: Alert Is patient on any of following O2  devices?: None of the above Nutritional status: No high risk factors Oral Assessment Risk : Low Risk Ice Chips Ice chips: Not tested Thin Liquid Thin Liquid: Impaired Presentation: Cup;Straw Pharyngeal  Phase Impairments: Cough - Immediate;Throat Clearing - Immediate Nectar Thick Nectar Thick Liquid: Not tested Honey Thick Honey Thick Liquid: Not tested Puree Puree: Within functional limits Presentation: Self Fed;Spoon Solid Solid: Within functional limits Presentation: Self Fed BSE Assessment Risk for Aspiration Impact on safety and function: Moderate aspiration risk Other Related Risk Factors: History of pneumonia;Previous CVA;Cognitive impairment  Short Term Goals: Week 1: SLP Short Term Goal 1 (Week 1): STGs=LTGs 2* ELOS  Refer to Care Plan for Long Term Goals  Recommendations for other services: Neuropsych  Discharge Criteria: Patient will be discharged from SLP if patient refuses treatment 3 consecutive times without medical reason, if treatment goals not met, if there is a change in medical status, if patient makes no progress towards goals or if patient is discharged from hospital.  The above assessment, treatment plan, treatment alternatives and goals  were discussed and mutually agreed upon: by patient  Jeannie Done, M.A., CCC-SLP   Yetta Barre 12/16/2022, 2:00 PM

## 2022-12-17 NOTE — Plan of Care (Signed)
  Problem: RH SAFETY Goal: RH STG ADHERE TO SAFETY PRECAUTIONS W/ASSISTANCE/DEVICE Description: STG Adhere to Safety Precautions With cues Assistance/Device. Outcome: Progressing   Problem: Health Behavior/Discharge Planning: Goal: Ability to manage health-related needs will improve Outcome: Progressing   Problem: Nutrition: Goal: Risk of aspiration will decrease Outcome: Progressing

## 2022-12-17 NOTE — Progress Notes (Signed)
Occupational Therapy Session Note  Patient Details  Name: Clayton [name] MRN: [id number] Date of Birth: December 10, 1934  {CHL IP REHAB OT TIME CALCULATIONS:[id number]}   Short Term Goals: Week 1:  OT Short Term Goal 1 (Week 1): LTG=STG 2/2 ELOS  Skilled Therapeutic Interventions/Progress Updates:  Pt received *** for skilled OT session with focus on ***. Pt agreeable to interventions, demonstrating overall *** mood. Pt reported ***/10 pain, stating "***" in reference to ***. OT offering intermediate rest breaks and positioning suggestions throughout session to address pain/fatigue and maximize participation/safety in session.    Pt remained *** with all immediate needs met at end of session. Pt continues to be appropriate for skilled OT intervention to promote further functional independence.   Therapy Documentation Precautions:  Precautions Precautions: Fall Precaution Comments: mild L inattention to surroundings Restrictions Weight Bearing Restrictions: No   Therapy/Group: Individual Therapy  Lou Cal, OTR/L, MSOT  12/17/2022, 3:16 PM

## 2022-12-17 NOTE — Progress Notes (Signed)
PROGRESS NOTE   Subjective/Complaints:  Family asking questions regarding Zio patch Wife [name] asking to be kept up to date regarding d/c planning  Pt also has a daughter who is OT at bedside  Per wife pt had some memory loss following hospitalization for PNA last winter but got back to normal prior to stroke Still does woodworking at home   ROS- neg CP, SOB, N/V/D    Objective:   No results found. Recent Labs    12/16/22 0615  WBC 8.3  HGB 15.0  HCT 44.0  PLT 281   Recent Labs    12/16/22 0615  NA 137  K 3.9  CL 106  CO2 22  GLUCOSE 119*  BUN 25*  CREATININE 1.31*  CALCIUM 8.6*    Intake/Output Summary (Last 24 hours) at 12/17/2022 1305 Last data filed at 12/17/2022 0730 Gross per 24 hour  Intake 480 ml  Output 300 ml  Net 180 ml        Physical Exam: Vital Signs Blood pressure (!) 141/71, pulse 76, temperature (!) 97.5 F (36.4 C), temperature source Oral, resp. rate 18, height 5\' 7"  (1.702 m), SpO2 92%.  General: No acute distress Mood and affect are appropriate Heart: Regular rate and rhythm no rubs murmurs or extra sounds Lungs: Clear to auscultation, breathing unlabored, no rales or wheezes Abdomen: Positive bowel sounds, soft nontender to palpation, nondistended Extremities: No clubbing, cyanosis, or edema Skin: No evidence of breakdown, no evidence of rash Neurologic: Cranial nerves II through XII intact, motor strength is 5/5 in bilateral deltoid, bicep, tricep, grip, hip flexor, knee extensors, ankle dorsiflexor and plantar flexor Sensory exam normal sensation to light touch and proprioception in bilateral upper and lower extremities Cerebellar exam normal finger to nose to finger as well as heel to shin in bilateral upper and lower extremities Left homonymous hemianopsia Orient to person and hospital but in "[place]" oriented to time  Musculoskeletal: Full range of motion in all 4  extremities. No joint swelling    Assessment/Plan: 1. Functional deficits which require 3+ hours per day of interdisciplinary therapy in a comprehensive inpatient rehab setting. Physiatrist is providing close team supervision and 24 hour management of active medical problems listed below. Physiatrist and rehab team continue to assess barriers to discharge/monitor patient progress toward functional and medical goals  Care Tool:  Bathing    Body parts bathed by patient: Right arm, Left arm, Abdomen, Chest, Buttocks, Front perineal area, Left upper leg, Right upper leg, Right lower leg, Left lower leg, Face         Bathing assist Assist Level: Minimal Assistance - Patient > 75%     Upper Body Dressing/Undressing Upper body dressing   What is the patient wearing?: Pull over shirt    Upper body assist Assist Level: Minimal Assistance - Patient > 75%    Lower Body Dressing/Undressing Lower body dressing      What is the patient wearing?: Underwear/pull up, Pants     Lower body assist Assist for lower body dressing: Minimal Assistance - Patient > 75%     Toileting Toileting    Toileting assist Assist for toileting: Minimal Assistance - Patient >  75%     Transfers Chair/bed transfer  Transfers assist     Chair/bed transfer assist level: Minimal Assistance - Patient > 75%     Locomotion Ambulation   Ambulation assist      Assist level: Contact Guard/Touching assist Assistive device: Walker-rolling Max distance: 165 ft   Walk 10 feet activity   Assist     Assist level: Contact Guard/Touching assist Assistive device: Walker-rolling   Walk 50 feet activity   Assist    Assist level: Contact Guard/Touching assist Assistive device: Walker-rolling    Walk 150 feet activity   Assist    Assist level: Contact Guard/Touching assist Assistive device: Walker-rolling    Walk 10 feet on uneven surface  activity   Assist Walk 10 feet on uneven  surfaces activity did not occur: Safety/medical concerns         Wheelchair     Assist Is the patient using a wheelchair?: Yes Type of Wheelchair: Manual    Wheelchair assist level: Dependent - Patient 0% Max wheelchair distance: 300 ft    Wheelchair 50 feet with 2 turns activity    Assist        Assist Level: Dependent - Patient 0%   Wheelchair 150 feet activity     Assist      Assist Level: Dependent - Patient 0%   Blood pressure (!) 141/71, pulse 76, temperature (!) 97.5 F (36.4 C), temperature source Oral, resp. rate 18, height 5\' 7"  (1.702 m), SpO2 92%.  Medical Problem List and Plan: 1. Functional deficits secondary to R PCA thrombotic CVA             -patient may shower             -ELOS/Goals: 10-12 days, SPV PT/OT/SLP 2.  Antithrombotics: -DVT/anticoagulation:  Pharmaceutical: Lovenox             -antiplatelet therapy: Plavix 3. Pain Management:  Tylenol prn. 4. Mood/Behavior/Sleep:  LCSW to follow for evaluation and support.              -antipsychotic agents: N/A             -Seroquel for insomnia   5. Neuropsych/cognition: This patient is not capable of making decisions on his own behalf.              - Delirium precautions, telesitter and bed alarm, utilize aides for orientation (hearing aides, glasses)   6. Skin/Wound Care: Routine pressure relief measures.  7. Fluids/Electrolytes/Nutrition: Monitor I/O. Check CMET in am.     Latest Ref Rng & Units 12/16/2022    6:15 AM 12/12/2022    7:49 AM 06/07/2022   10:07 AM  BMP  Glucose 70 - 99 mg/dL 161  096  045   BUN 8 - 23 mg/dL 25  18  14    Creatinine 0.61 - 1.24 mg/dL 4.09  8.11  9.14   Sodium 135 - 145 mmol/L 137  139  138   Potassium 3.5 - 5.1 mmol/L 3.9  4.5  3.7   Chloride 98 - 111 mmol/L 106  108  107   CO2 22 - 32 mmol/L 22  21  21    Calcium 8.9 - 10.3 mg/dL 8.6  7.6  8.6     8. HTN: Monitor BP TID--not on any meds PTA. Vitals:   12/16/22 1251 12/16/22 1921  BP: 131/74 (!)  141/71  Pulse: 72 76  Resp: 16 18  Temp: 97.7 F (36.5 C) (!) 97.5 F (  36.4 C)  SpO2: 100% 92%    9. H/o GIB X 2: Will resume Protonix and monitor stool for occult blood.             --monitor H/H    Latest Ref Rng & Units 12/16/2022    6:15 AM 12/12/2022    7:49 AM 08/04/2022    9:08 AM  CBC  WBC 4.0 - 10.5 K/uL 8.3  7.0  5.9   Hemoglobin 13.0 - 17.0 g/dL 40.9  81.1  91.4   Hematocrit 39.0 - 52.0 % 44.0  44.9  41.2   Platelets 150 - 400 K/uL 281  299  313     10. Anxiety d/o: Continue Buspar bid    11. Cardiac monitoring: Has Zio patch/event monitor. Wife with multiple questions NW:GNFA,OZH, care.              Dr Juliann Pares is cardiologist    LOS: 2 days A FACE TO FACE EVALUATION WAS PERFORMED  Erick Colace 12/17/2022, 1:05 PM

## 2022-12-17 NOTE — Plan of Care (Signed)
  Problem: RH Balance Goal: LTG Patient will maintain dynamic sitting balance (PT) Description: LTG:  Patient will maintain dynamic sitting balance with assistance during mobility activities (PT) Flowsheets (Taken 12/16/2022 1733) LTG: Pt will maintain dynamic sitting balance during mobility activities with:: Independent with assistive device  Goal: LTG Patient will maintain dynamic standing balance (PT) Description: LTG:  Patient will maintain dynamic standing balance with assistance during mobility activities (PT) Flowsheets (Taken 12/16/2022 1733) LTG: Pt will maintain dynamic standing balance during mobility activities with:: Supervision/Verbal cueing   Problem: Sit to Stand Goal: LTG:  Patient will perform sit to stand with assistance level (PT) Description: LTG:  Patient will perform sit to stand with assistance level (PT) Flowsheets (Taken 12/16/2022 1733) LTG: PT will perform sit to stand in preparation for functional mobility with assistance level: Independent with assistive device   Problem: RH Bed Mobility Goal: LTG Patient will perform bed mobility with assist (PT) Description: LTG: Patient will perform bed mobility with assistance, with/without cues (PT). Flowsheets (Taken 12/16/2022 1733) LTG: Pt will perform bed mobility with assistance level of: Independent with assistive device    Problem: RH Bed to Chair Transfers Goal: LTG Patient will perform bed/chair transfers w/assist (PT) Description: LTG: Patient will perform bed to chair transfers with assistance (PT). Flowsheets (Taken 12/16/2022 1733) LTG: Pt will perform Bed to Chair Transfers with assistance level: Supervision/Verbal cueing   Problem: RH Car Transfers Goal: LTG Patient will perform car transfers with assist (PT) Description: LTG: Patient will perform car transfers with assistance (PT). Flowsheets (Taken 12/16/2022 1733) LTG: Pt will perform car transfers with assist:: Supervision/Verbal cueing   Problem: RH  Furniture Transfers Goal: LTG Patient will perform furniture transfers w/assist (OT/PT) Description: LTG: Patient will perform furniture transfers  with assistance (OT/PT). Flowsheets (Taken 12/16/2022 1733) LTG: Pt will perform furniture transfers with assist:: Supervision/Verbal cueing   Problem: RH Ambulation Goal: LTG Patient will ambulate in controlled environment (PT) Description: LTG: Patient will ambulate in a controlled environment, # of feet with assistance (PT). Flowsheets (Taken 12/16/2022 1733) LTG: Pt will ambulate in controlled environ  assist needed:: Supervision/Verbal cueing LTG: Ambulation distance in controlled environment: at least 300 ft using LRAD Goal: LTG Patient will ambulate in home environment (PT) Description: LTG: Patient will ambulate in home environment, # of feet with assistance (PT). Flowsheets (Taken 12/16/2022 1733) LTG: Pt will ambulate in home environ  assist needed:: Supervision/Verbal cueing LTG: Ambulation distance in home environment: up to 50 ft using LRAD and completing all turns and maneuvering to navigate in and out of home to workspace   Problem: RH Stairs Goal: LTG Patient will ambulate up and down stairs w/assist (PT) Description: LTG: Patient will ambulate up and down # of stairs with assistance (PT) Flowsheets (Taken 12/16/2022 1733) LTG: Pt will ambulate up/down stairs assist needed:: Supervision/Verbal cueing LTG: Pt will  ambulate up and down number of stairs: at least 7 steps using HR as per home environment

## 2022-12-18 DIAGNOSIS — I639 Cerebral infarction, unspecified: Secondary | ICD-10-CM

## 2022-12-18 NOTE — Plan of Care (Signed)
  Problem: Consults Goal: RH STROKE PATIENT EDUCATION Description: See Patient Education module for education specifics  Outcome: Progressing   Problem: RH SAFETY Goal: RH STG ADHERE TO SAFETY PRECAUTIONS W/ASSISTANCE/DEVICE Description: STG Adhere to Safety Precautions With cues Assistance/Device. Outcome: Progressing   Problem: RH KNOWLEDGE DEFICIT Goal: RH STG INCREASE KNOWLEDGE OF HYPERTENSION Description: Patient and wife will be able to manage HTN with medications and dietary modification using educational resources independently Outcome: Progressing Goal: RH STG INCREASE KNOWLEGDE OF HYPERLIPIDEMIA Description: Patient and wife will be able to manage HLD with medications and dietary modification using educational resources independently Outcome: Progressing Goal: RH STG INCREASE KNOWLEDGE OF STROKE PROPHYLAXIS Description: Patient and wife will be able to manage secondary risks with medications and dietary modification using educational resources independently Outcome: Progressing   Problem: Education: Goal: Knowledge of disease or condition will improve Outcome: Progressing Goal: Knowledge of secondary prevention will improve (MUST DOCUMENT ALL) Outcome: Progressing Goal: Knowledge of patient specific risk factors will improve Loraine Leriche N/A or DELETE if not current risk factor) Outcome: Progressing   Problem: Ischemic Stroke/TIA Tissue Perfusion: Goal: Complications of ischemic stroke/TIA will be minimized Outcome: Progressing   Problem: Coping: Goal: Will verbalize positive feelings about self Outcome: Progressing Goal: Will identify appropriate support needs Outcome: Progressing   Problem: Health Behavior/Discharge Planning: Goal: Ability to manage health-related needs will improve Outcome: Progressing Goal: Goals will be collaboratively established with patient/family Outcome: Progressing   Problem: Self-Care: Goal: Ability to participate in self-care as condition  permits will improve Outcome: Progressing Goal: Verbalization of feelings and concerns over difficulty with self-care will improve Outcome: Progressing Goal: Ability to communicate needs accurately will improve Outcome: Progressing   Problem: Nutrition: Goal: Risk of aspiration will decrease Outcome: Progressing Goal: Dietary intake will improve Outcome: Progressing

## 2022-12-18 NOTE — Progress Notes (Signed)
Physical Therapy Session Note  Patient Details  Name: Clayton [name] MRN: [id number] Date of Birth: January 29, 1935  Today's Date: 12/18/2022 PT Individual Time: 0815-0915 PT Individual Time Calculation (min): 60 min   Short Term Goals: Week 1:  PT Short Term Goal 1 (Week 1): STG = LTG d/t ELOS  Skilled Therapeutic Interventions/Progress Updates:      Therapy Documentation Precautions:  Precautions Precautions: Fall Precaution Comments: mild L inattention to surroundings Restrictions Weight Bearing Restrictions: No   Pt received seated edge of bed eating breakfast and requested additional time to finish. Pt agreeable to PT on return and without reports of pain.   Pt CGA with sit to stand with RW and gait ~200 ft to main gym with noted decreased left foot clearance, shuffle gait and anterior truncal bias.   Pt with decreased left hip/knee/ankle flexibility and agreeable to PROM. Pt performed passive stretching to quads (modified thomas position), piriformis (supine figure four), hamstrings, and gastroc (standing on wedge with anterior weight shift).   Pt participated in blocked practice of gait training without RW and pt ambulated >300 ft with min A. Pt requires external feedback (tactile- 1.5# ankle weight) and verbal cues to increase foot clearance and step length.    Pt requires min A for dynamic gait while weaving around cones placed ~1 foot apart with 1.5 left ankle weight and without. Pt with decreased heel to toe gait and shuffle and provided multi-modal cues (mod).   Pt ambulated to room ~200 ft CGA with RW and left seated edge of bed per patient preference. Pt left with all needs in reach and alarm on.    Therapy/Group: Individual Therapy  Truitt Leep Truitt Leep PT, DPT  12/18/2022, 8:53 AM

## 2022-12-18 NOTE — Progress Notes (Signed)
PROGRESS NOTE   Subjective/Complaints:  Seen in PT gym , working on amb with walker and left ankle weight  No c/os  ROS- neg CP, SOB, N/V/D    Objective:   No results found. Recent Labs    12/16/22 0615  WBC 8.3  HGB 15.0  HCT 44.0  PLT 281   Recent Labs    12/16/22 0615  NA 137  K 3.9  CL 106  CO2 22  GLUCOSE 119*  BUN 25*  CREATININE 1.31*  CALCIUM 8.6*    Intake/Output Summary (Last 24 hours) at 12/18/2022 0850 Last data filed at 12/17/2022 1857 Gross per 24 hour  Intake 360 ml  Output --  Net 360 ml        Physical Exam: Vital Signs Blood pressure (!) 149/76, pulse 74, temperature 97.8 F (36.6 C), temperature source Oral, resp. rate 16, height 5\' 7"  (1.702 m), SpO2 96%.  General: No acute distress Mood and affect are appropriate Heart: Regular rate and rhythm no rubs murmurs or extra sounds Lungs: Clear to auscultation, breathing unlabored, no rales or wheezes Abdomen: Positive bowel sounds, soft nontender to palpation, nondistended Extremities: No clubbing, cyanosis, or edema Skin: No evidence of breakdown, no evidence of rash Neurologic: Cranial nerves II through XII intact, motor strength is 5/5 in bilateral deltoid, bicep, tricep, grip, hip flexor, knee extensors, ankle dorsiflexor and plantar flexor Sensory exam normal sensation to light touch and proprioception in bilateral upper and lower extremities Cerebellar exam normal finger to nose to finger as well as heel to shin in bilateral upper and lower extremities Left homonymous hemianopsia Orient to person and hospital but in "[place]" oriented to time  Musculoskeletal: Full range of motion in all 4 extremities. No joint swelling    Assessment/Plan: 1. Functional deficits which require 3+ hours per day of interdisciplinary therapy in a comprehensive inpatient rehab setting. Physiatrist is providing close team supervision and 24 hour  management of active medical problems listed below. Physiatrist and rehab team continue to assess barriers to discharge/monitor patient progress toward functional and medical goals  Care Tool:  Bathing    Body parts bathed by patient: Right arm, Left arm, Abdomen, Chest, Buttocks, Front perineal area, Left upper leg, Right upper leg, Right lower leg, Left lower leg, Face         Bathing assist Assist Level: Minimal Assistance - Patient > 75%     Upper Body Dressing/Undressing Upper body dressing   What is the patient wearing?: Pull over shirt    Upper body assist Assist Level: Minimal Assistance - Patient > 75%    Lower Body Dressing/Undressing Lower body dressing      What is the patient wearing?: Underwear/pull up, Pants     Lower body assist Assist for lower body dressing: Minimal Assistance - Patient > 75%     Toileting Toileting    Toileting assist Assist for toileting: Minimal Assistance - Patient > 75%     Transfers Chair/bed transfer  Transfers assist     Chair/bed transfer assist level: Minimal Assistance - Patient > 75%     Locomotion Ambulation   Ambulation assist      Assist level:  Contact Guard/Touching assist Assistive device: Walker-rolling Max distance: 165 ft   Walk 10 feet activity   Assist     Assist level: Contact Guard/Touching assist Assistive device: Walker-rolling   Walk 50 feet activity   Assist    Assist level: Contact Guard/Touching assist Assistive device: Walker-rolling    Walk 150 feet activity   Assist    Assist level: Contact Guard/Touching assist Assistive device: Walker-rolling    Walk 10 feet on uneven surface  activity   Assist Walk 10 feet on uneven surfaces activity did not occur: Safety/medical concerns         Wheelchair     Assist Is the patient using a wheelchair?: Yes Type of Wheelchair: Manual    Wheelchair assist level: Dependent - Patient 0% Max wheelchair distance:  300 ft    Wheelchair 50 feet with 2 turns activity    Assist        Assist Level: Dependent - Patient 0%   Wheelchair 150 feet activity     Assist      Assist Level: Dependent - Patient 0%   Blood pressure (!) 149/76, pulse 74, temperature 97.8 F (36.6 C), temperature source Oral, resp. rate 16, height 5\' 7"  (1.702 m), SpO2 96%.  Medical Problem List and Plan: 1. Functional deficits secondary to R PCA thrombotic CVA             -patient may shower             -ELOS/Goals: 10-12 days, SPV PT/OT/SLP 2.  Antithrombotics: -DVT/anticoagulation:  Pharmaceutical: Lovenox             -antiplatelet therapy: Plavix 3. Pain Management:  Tylenol prn. 4. Mood/Behavior/Sleep:  LCSW to follow for evaluation and support.              -antipsychotic agents: N/A             -Seroquel for insomnia   5. Neuropsych/cognition: This patient is not capable of making decisions on his own behalf.              - Delirium precautions, telesitter and bed alarm, utilize aides for orientation (hearing aides, glasses)   6. Skin/Wound Care: Routine pressure relief measures.  7. Fluids/Electrolytes/Nutrition: Monitor I/O. Check CMET in am.     Latest Ref Rng & Units 12/16/2022    6:15 AM 12/12/2022    7:49 AM 06/07/2022   10:07 AM  BMP  Glucose 70 - 99 mg/dL 865  784  696   BUN 8 - 23 mg/dL 25  18  14    Creatinine 0.61 - 1.24 mg/dL 2.95  2.84  1.32   Sodium 135 - 145 mmol/L 137  139  138   Potassium 3.5 - 5.1 mmol/L 3.9  4.5  3.7   Chloride 98 - 111 mmol/L 106  108  107   CO2 22 - 32 mmol/L 22  21  21    Calcium 8.9 - 10.3 mg/dL 8.6  7.6  8.6     8. HTN: Monitor BP TID--not on any meds PTA. Vitals:   12/17/22 1918 12/18/22 0533  BP: (!) 141/77 (!) 149/76  Pulse: 79 74  Resp: 18 16  Temp: 99 F (37.2 C) 97.8 F (36.6 C)  SpO2: 97% 96%    9. H/o GIB X 2: Will resume Protonix and monitor stool for occult blood.             --monitor H/H    Latest Ref Rng &  Units 12/16/2022    6:15  AM 12/12/2022    7:49 AM 08/04/2022    9:08 AM  CBC  WBC 4.0 - 10.5 K/uL 8.3  7.0  5.9   Hemoglobin 13.0 - 17.0 g/dL 16.1  09.6  04.5   Hematocrit 39.0 - 52.0 % 44.0  44.9  41.2   Platelets 150 - 400 K/uL 281  299  313     10. Anxiety d/o: Continue Buspar bid    11. Cardiac monitoring: Has Zio patch/event monitor. Wife with multiple questions WU:JWJX,BJY, care.              Dr Juliann Pares is cardiologist    LOS: 3 days A FACE TO FACE EVALUATION WAS PERFORMED  Erick Colace 12/18/2022, 8:50 AM

## 2022-12-18 NOTE — Progress Notes (Signed)
Speech Language Pathology Daily Session Note  Patient Details  Name: Clayton [name] MRN: [id number] Date of Birth: [birth year]  Today's Date: 12/18/2022 SLP Individual Time: 1300-1405 SLP Individual Time Calculation (min): 65 min  Short Term Goals: Week 1: SLP Short Term Goal 1 (Week 1): STGs=LTGs 2* ELOS  Skilled Therapeutic Interventions:  Pt was seen in PM to address cognitive re- training. Pt was alert nad seated upright in WC upon SLP arrival. Assigned NT also present and assisting pt back to bed per pt request. Family present including pt's wife, dtr and son in law. Pt's dtr and son in law left shortly after this SLP arrived however wife remained and participated throughout session. Pt's wife providing thorough and relevant recent medical hx to this SLP. Per wife, pt was functioning WNL prior to Feb of 2024 when he got PNA. She reports that his cognition has declined significantly since then and that pt currently has a dx of a moderate cognitive impairment. She reports pt is not typically oriented to month, day of week, or year since February and has difficulty with names, and higher level tasks. She also reports that pt has hearing aids however rarely wears them. Pt alert throughout session and pleasantly confused. He was oriented to being in the hospital but unsure of city. SLP challenging pt in recall utilized spaced retrieval techniques and basic problem solving. Pt oriented to call button with SLP challenging pt to identify examples of use. Pt with consistent limited awareness of deficits and requiring re training in all attempts.Given scenarios presented verbally, he identified uses of call button in 2/3 opportunities. Spaced retrieval techniques used to train pt in not standing by himself. Pt recalled not standing by him self for up to 30 seconds before forgetting. Education and discussion completed in final minutes of session with pt's wife regarding initiating a memory book for pt due  to recent steady decline of memory. Pt's wife receptive and open to idea. Pt left at bedside with call button within reach, telesitter present, bed alarm on and wife present, SLP to continue POC.   Pain Pain Assessment Pain Scale: 0-10 Pain Score: 0-No pain  Therapy/Group: Individual Therapy  Renaee Munda 12/18/2022, 3:45 PM

## 2022-12-19 ENCOUNTER — Inpatient Hospital Stay (HOSPITAL_COMMUNITY): Payer: Medicare PPO

## 2022-12-19 LAB — CBC
HCT: 42.8 % (ref 39.0–52.0)
Hemoglobin: 14.3 g/dL (ref 13.0–17.0)
MCH: 31.9 pg (ref 26.0–34.0)
MCHC: 33.4 g/dL (ref 30.0–36.0)
MCV: 95.5 fL (ref 80.0–100.0)
Platelets: 304 10*3/uL (ref 150–400)
RBC: 4.48 MIL/uL (ref 4.22–5.81)
RDW: 13.8 % (ref 11.5–15.5)
WBC: 9.6 10*3/uL (ref 4.0–10.5)
nRBC: 0 % (ref 0.0–0.2)

## 2022-12-19 LAB — BASIC METABOLIC PANEL
Anion gap: 9 (ref 5–15)
BUN: 21 mg/dL (ref 8–23)
CO2: 23 mmol/L (ref 22–32)
Calcium: 8.5 mg/dL — ABNORMAL LOW (ref 8.9–10.3)
Chloride: 106 mmol/L (ref 98–111)
Creatinine, Ser: 1.4 mg/dL — ABNORMAL HIGH (ref 0.61–1.24)
GFR, Estimated: 49 mL/min — ABNORMAL LOW (ref >60)
Glucose, Bld: 100 mg/dL — ABNORMAL HIGH (ref 70–99)
Potassium: 3.5 mmol/L (ref 3.5–5.1)
Sodium: 138 mmol/L (ref 135–145)

## 2022-12-19 MED ORDER — LORAZEPAM 0.5 MG PO TABS
1.0000 mg | ORAL_TABLET | Freq: Once | ORAL | Status: AC
  Administered 2022-12-19: 1 mg via ORAL
  Filled 2022-12-19: qty 2

## 2022-12-19 MED ORDER — TRAZODONE HCL 50 MG PO TABS
50.0000 mg | ORAL_TABLET | Freq: Every evening | ORAL | Status: DC | PRN
  Administered 2022-12-19 – 2022-12-28 (×8): 50 mg via ORAL
  Filled 2022-12-19 (×8): qty 1

## 2022-12-19 MED ORDER — QUETIAPINE FUMARATE 50 MG PO TABS
50.0000 mg | ORAL_TABLET | Freq: Every day | ORAL | Status: DC
Start: 2022-12-19 — End: 2022-12-19

## 2022-12-19 MED ORDER — QUETIAPINE FUMARATE 50 MG PO TABS
75.0000 mg | ORAL_TABLET | Freq: Every day | ORAL | Status: DC
  Administered 2022-12-19 – 2022-12-27 (×9): 75 mg via ORAL
  Filled 2022-12-19 (×9): qty 1

## 2022-12-19 NOTE — Progress Notes (Signed)
PROGRESS NOTE   Subjective/Complaints:  Restless overnight, rno response to scheduled or PRN antipsychotics; increase regimen today Labs stable, virtals stable, no incontinence Wife at bedside, patient down in MBS at time of rounding. No questions at this time. No new concerns per nursing.   ROS- unable to obtain d/t patient off of unit   Objective:   No results found. Recent Labs    12/19/22 0556  WBC 9.6  HGB 14.3  HCT 42.8  PLT 304   Recent Labs    12/19/22 0556  NA 138  K 3.5  CL 106  CO2 23  GLUCOSE 100*  BUN 21  CREATININE 1.40*  CALCIUM 8.5*    Intake/Output Summary (Last 24 hours) at 12/19/2022 0852 Last data filed at 12/18/2022 1255 Gross per 24 hour  Intake 240 ml  Output --  Net 240 ml        Physical Exam: Vital Signs Blood pressure (!) 145/73, pulse 79, temperature 98.6 F (37 C), temperature source Oral, resp. rate 18, height 5\' 7"  (1.702 m), SpO2 97%.  Prior exams: General: No acute distress Mood and affect are appropriate Heart: Regular rate and rhythm no rubs murmurs or extra sounds Lungs: Clear to auscultation, breathing unlabored, no rales or wheezes Abdomen: Positive bowel sounds, soft nontender to palpation, nondistended Extremities: No clubbing, cyanosis, or edema Skin: No evidence of breakdown, no evidence of rash Neurologic: Cranial nerves II through XII intact, motor strength is 5/5 in bilateral deltoid, bicep, tricep, grip, hip flexor, knee extensors, ankle dorsiflexor and plantar flexor Sensory exam normal sensation to light touch and proprioception in bilateral upper and lower extremities Cerebellar exam normal finger to nose to finger as well as heel to shin in bilateral upper and lower extremities Left homonymous hemianopsia Orient to person and hospital but in "[place]" oriented to time  Musculoskeletal: Full range of motion in all 4 extremities. No joint  swelling    Assessment/Plan: 1. Functional deficits which require 3+ hours per day of interdisciplinary therapy in a comprehensive inpatient rehab setting. Physiatrist is providing close team supervision and 24 hour management of active medical problems listed below. Physiatrist and rehab team continue to assess barriers to discharge/monitor patient progress toward functional and medical goals  Care Tool:  Bathing    Body parts bathed by patient: Right arm, Left arm, Abdomen, Chest, Buttocks, Front perineal area, Left upper leg, Right upper leg, Right lower leg, Left lower leg, Face         Bathing assist Assist Level: Minimal Assistance - Patient > 75%     Upper Body Dressing/Undressing Upper body dressing   What is the patient wearing?: Pull over shirt    Upper body assist Assist Level: Minimal Assistance - Patient > 75%    Lower Body Dressing/Undressing Lower body dressing      What is the patient wearing?: Underwear/pull up, Pants     Lower body assist Assist for lower body dressing: Minimal Assistance - Patient > 75%     Toileting Toileting    Toileting assist Assist for toileting: Minimal Assistance - Patient > 75%     Transfers Chair/bed transfer  Transfers assist  Chair/bed transfer assist level: Minimal Assistance - Patient > 75%     Locomotion Ambulation   Ambulation assist      Assist level: Contact Guard/Touching assist Assistive device: Walker-rolling Max distance: 165 ft   Walk 10 feet activity   Assist     Assist level: Contact Guard/Touching assist Assistive device: Walker-rolling   Walk 50 feet activity   Assist    Assist level: Contact Guard/Touching assist Assistive device: Walker-rolling    Walk 150 feet activity   Assist    Assist level: Contact Guard/Touching assist Assistive device: Walker-rolling    Walk 10 feet on uneven surface  activity   Assist Walk 10 feet on uneven surfaces activity did not  occur: Safety/medical concerns         Wheelchair     Assist Is the patient using a wheelchair?: Yes Type of Wheelchair: Manual    Wheelchair assist level: Dependent - Patient 0% Max wheelchair distance: 300 ft    Wheelchair 50 feet with 2 turns activity    Assist        Assist Level: Dependent - Patient 0%   Wheelchair 150 feet activity     Assist      Assist Level: Dependent - Patient 0%   Blood pressure (!) 145/73, pulse 79, temperature 98.6 F (37 C), temperature source Oral, resp. rate 18, height 5\' 7"  (1.702 m), SpO2 97%.  Medical Problem List and Plan: 1. Functional deficits secondary to R PCA thrombotic CVA             -patient may shower             -ELOS/Goals: 10-12 days, SPV PT/OT/SLP  2.  Antithrombotics: -DVT/anticoagulation:  Pharmaceutical: Lovenox             -antiplatelet therapy: Plavix  3. Pain Management:  Tylenol prn.   - 0 pain reported on nursing assessments; monitor  4. Mood/Behavior/Sleep/at bedtime aggitation:  LCSW to follow for evaluation and support.              -antipsychotic agents: N/A             -Seroquel for insomnia   - 8/19: Increase at bedtime seroquel to 75 mg, add PRN trazodone 50 mg for sleep   5. Neuropsych/cognition: This patient is not capable of making decisions on his own behalf.              - Delirium precautions, telesitter and bed alarm, utilize aides for orientation (hearing aides, glasses)  6. Skin/Wound Care: Routine pressure relief measures.  7. Fluids/Electrolytes/Nutrition: Monitor I/O. Check CMET in am. Baseline OP Cr appears 1.1-1.2.   - 8/19: BUN down, Cr up slightly; Encourage Po fluids, monitor I/Os, recheck in AM     Latest Ref Rng & Units 12/19/2022    5:56 AM 12/16/2022    6:15 AM 12/12/2022    7:49 AM  BMP  Glucose 70 - 99 mg/dL 161  096  045   BUN 8 - 23 mg/dL 21  25  18    Creatinine 0.61 - 1.24 mg/dL 4.09  8.11  9.14   Sodium 135 - 145 mmol/L 138  137  139   Potassium 3.5 -  5.1 mmol/L 3.5  3.9  4.5   Chloride 98 - 111 mmol/L 106  106  108   CO2 22 - 32 mmol/L 23  22  21    Calcium 8.9 - 10.3 mg/dL 8.5  8.6  7.6  8. HTN: Monitor BP TID--not on any meds PTA.  - well controlled, monitor Vitals:   12/18/22 1252 12/18/22 1955  BP: 119/77 (!) 145/73  Pulse: 92 79  Resp: 18 18  Temp: 98.1 F (36.7 C) 98.6 F (37 C)  SpO2: 98% 97%    9. H/o GIB X 2: Will resume Protonix and monitor stool for occult blood.             --monitor H/H   - HgB stable, LBM 8/18 large nonbloody; monitor     Latest Ref Rng & Units 12/19/2022    5:56 AM 12/16/2022    6:15 AM 12/12/2022    7:49 AM  CBC  WBC 4.0 - 10.5 K/uL 9.6  8.3  7.0   Hemoglobin 13.0 - 17.0 g/dL 16.1  09.6  04.5   Hematocrit 39.0 - 52.0 % 42.8  44.0  44.9   Platelets 150 - 400 K/uL 304  281  299     10. Anxiety d/o: Continue Buspar bid    11. Cardiac monitoring: Has Zio patch/event monitor. Wife with multiple questions WU:JWJX,BJY, care.              Dr Juliann Pares is cardiologist    LOS: 4 days A FACE TO FACE EVALUATION WAS PERFORMED  Angelina Sheriff 12/19/2022, 8:52 AM

## 2022-12-19 NOTE — Progress Notes (Signed)
Physical Therapy Session Note  Patient Details  Name: Clayton [name] MRN: [id number] Date of Birth: 06-May-1934  Today's Date: 12/19/2022 PT Individual Time: 8469-6295 PT Individual Time Calculation (min): 70 min   Short Term Goals: Week 1:  PT Short Term Goal 1 (Week 1): STG = LTG d/t ELOS  Skilled Therapeutic Interventions/Progress Updates:    Chart reviewed and pt agreeable to therapy. Pt received semi-reclined in bed with no c/o pain. Session focused on amb quality and endurance, activity tolerance, and balance to promote safe home access. Pt initiated session with amb of 226ft to therapy gym using minA + RW. Pt noted to have slight toe catching of LLE and required minA for RW navigation. Pt then completed amb of 291ft with VC on RW safety and faded to CGA + RW. Pt also completed 12 mins on NuStep for interval training at workloads 2-8 Pt then completed blocked practice of high knee amb and backwards walking with minA/CGA + RW. Pt also completed series of balance exercises including neutral stance, narrow stance, and eyes closed all with CGA and periodic minA. Pt then amb to room with CGA + RW. In room, pt and wife educated on need for continued balance training..  At end of session, pt was left seated in recliner with alarm engaged, nurse call bell and all needs in reach.     Therapy Documentation Precautions:  Precautions Precautions: Fall Precaution Comments: mild L inattention to surroundings Restrictions Weight Bearing Restrictions: No General:      Therapy/Group: Individual Therapy  Dionne Milo, PT, DPT 12/19/2022, 4:07 PM

## 2022-12-19 NOTE — Procedures (Signed)
Modified Barium Swallow Study  Patient Details  Name: Clayton [name] MRN: [id number] Date of Birth: 06/04/1934  Today's Date: 12/19/2022  Modified Barium Swallow completed.  Full report located under Chart Review in the Imaging Section.  History of Present Illness Clayton [name] is a 88 y.o. male with medical history significant of cognitive impairment, history of TBI, hypertension presenting with weakness, confusion.  Noted admission February 2024 with multifocal pneumonia that resulted in worsening cognitive impairment per the wife.  He was admitted to [facility] 12/12/22 for stroke work up. Pt. Presented to the ER afebrile, hemodynamically stable.  White count 7, hemoglobin 14.4, platelets 299, COVID flu RSV negative, ammonia level within normal limits, urinalysis not indicative of infection.  CT head within normal limits .  MRI of the brain showed scattered small acute to subacute infarcts in the right PCA distribution.  LDL 98, HDL 39.  A1c 6.1.  Patient is treated with atorvastatin, Plavix.  He previous had reaction to aspirin, could not tolerate.  Decision was made to treat with Plavix for long-term. Echocardiogram was also performed, showed normal ejection fraction without a source of thrombosis. Zio patch to be placed prior to d/c. Pt. Seen by PT,OT, SLP and they recommend CIR to assist return to PLOF.  Clinical Impression Patient presents with mild oropharyngeal post-stroke dysphagia primary characterized by slowed oral preparation of solids and reduced pharyngeal efficiency resulting in trace-mild diffuse pharyngeal residue across trials with residue increasing in amount conversely with decrease of bolus viscosity. Patient also exhibited 1x of swallow mistiming with thin liquids on first swallow of the study resulting in trace audible aspiration which was not cleared from the airway despite attempts. During this attempt to clear the airway, patient exhibited a  weakened cough response which contributes to potential risk for aspiration related sequelae. During further comprehensive challenging, no aspiration observed with thin liquids beyond the aforementioned. With thin liquids from straw, SLP observed trace transient penetration above the level of the vocal folds which cleared from the vestibule with completion of the swallow. Patient exhibits mildly reduced BOT retraction and pharyngeal stripping wave resulting in trace-mild diffuse residue of thin liquids coating pharyngeal structures. Residue did not penetrate/aspirate. Recommend continuation of regular/thin liquid diet with the following modifications: NO STRAWS, single sips only, medications administered whole in applesauce. Patient would benefit from intermittent supervision to ensure utilization of above mentioned swallow safety strategies. As patient presents with mild risk for aspiration, SLP will monitor closely for clinical symptoms of aspiration with thin liquids. Patient would benefit from dysphagia exercise program to improve pharyngeal efficiency and strength of cough response to reduce risk of aspiration and related sequelae. SLP will continue to follow per POC.   Factors that may increase risk of adverse event in presence of aspiration Clayton [name] & Clearance Clayton [name] 2021): Weak cough  Swallow Evaluation Recommendations Recommendations: PO diet PO Diet Recommendation: Regular;Thin liquids (Level 0) Liquid Administration via: Spoon;Cup Medication Administration: Whole meds with puree Supervision: Intermittent supervision/cueing for swallowing strategies;Patient able to self-feed Swallowing strategies  : Slow rate;Small bites/sips Postural changes: Position pt fully upright for meals;Stay upright 30-60 min after meals Oral care recommendations: Oral care BID (2x/day)  Jeannie Done, M.A., CCC-SLP   Morrie Sheldon A Drago Hammonds 12/19/2022,10:16 AM

## 2022-12-19 NOTE — Progress Notes (Signed)
Patient continually trying to get out of bed to go to the kitchen. Nursing has tried reorienting him unsuccessfully. Charge nurse moved patient to nurses station, but patient continued trying to get out of his chair and became agitated with charge nurse. Patient does not remember where he is or that he had a stroke. Nurse gave scheduled Seroquel and PRN Zyprexa for agitation without relief of symptoms. Nursing is having to sit with patient one on one to prevent patient injuring himself.

## 2022-12-19 NOTE — Progress Notes (Signed)
Occupational Therapy Session Note  Patient Details  Name: Clayton [name] MRN: [id number] Date of Birth: October 09, 1934  Today's Date: 12/19/2022 OT Individual Time: 1003-1059 OT Individual Time Calculation (min): 56 min  OT Individual Time: 1303-1330 OT Individual Time Calculation (min): 27 min    Short Term Goals: Week 1:  OT Short Term Goal 1 (Week 1): LTG=STG 2/2 ELOS  Skilled Therapeutic Interventions/Progress Updates:     AM Session:  Pt received semi-reclined in bed with wife present in room and tele-sitter on. Pt presenting to be in good spirits, however mildly confused. Pt able to recall that he is in the hospital, however unaware of reason stating "I think I just got sick and need to get better". OT provided gentle re-orientation and education on recent CVA with Pt verbalizing understanding. Pt receptive to skilled OT session reporting 0/10 pain- OT offering intermittent rest breaks, repositioning, and therapeutic support to optimize participation in therapy session. Pt receptive to taking shower this session and completing morning BADL routine. Facilitated L visual scanning and increased L side attention throughout session by setting up environment to facilitate pt turning to L, positioning items on L side, and OT being positioned on Pt's L during session. Pt able to complete sit<>stands and functional transfers to elevated toilet seat and TTB placed in walk-in shower with CGA-min A for RW management and balance. Pt completed 3/3 toileting tasks with CGA (+B&B documented in flowsheets). When completing functional mobility during session, Pt required mod verbal cues to turn head to locate doorways or obstacles on his L side and for RW management. Pt doffed clothing while seated on elevated toilet seat supervision with min verbal cues for L side attention. Pt completed UB bathing seated on TTB with min tactile cues required to attend to washing L hemi-body and LB bathing with CGA when  standing to wash his bottom. Pt completed U/LB dressing while seated EOB with education provided on hemi-dressing techniques and Pt verbalizing understanding. Pt donned shirt wit min verbal cues and pants with light min A provided for balance. Engaged Pt in completing grooming/hygiene tasks standing at sink for increased balance challenge. Facilitated L visual scanning by placing 4 toiletry items on L side of sink with Pt able to locate 4/4 items with increased time and max verbal cues. Pt maintained standing balance with CGA using RW with mild posterior lean noted. Pt returned to bed EOB>supine supervision. Pt's wife inquiring about Pt's CVA with education provided on etiology and recovery process with Pt's wife verbalizing understanding. Pt was left resting in bed with call bell in reach, bed alarm on, telesitter on, and all needs met.    PM Session:  Pt received sitting up in wc presenting to be in good spirits receptive to skilled OT session reporting 0/10 pain- OT offering intermittent rest breaks, repositioning, and therapeutic support to optimize participation in therapy session. Pt's wife present in room upon OT arrival. Pt requesting to use restroom at beginning of therapy session. Sit>stand functional mobility to bathroom using RW CGA with mod verbal cues for visual scanning, L side attention, and safety awareness using RW. Pt able to maintain standing balance while having continent void in toilet and complete clothing management with CGA. Engaged Pt in completing functional mobility to therapy room ~150 ft using RW with mod obstacles present in hallway during ambulation. Pt required mod-max VB cueing to navigate around obstacles on L side, for RW management, and to stay within middle of hallway as Pt tends to  push RW to R side d/t inattention of L visual field and L side of hallway. Engaged Pt in dynamic standing balance functional reaching and visual scanning task using cards placed on vertical  mirror. Pt instructed to visually scan to locate each card's match and place the matching card on top. Pt required mod-max verbal cues to turn head to L to locate cards on L side of board. Education provided on compensatory head turning to accommodate for decreased attention to L side and lighthouse scanning technique to support improved visual scanning with Pt verbalizing understanding. Pt completed functional mobility back to room using RW with CGA and mod verbal cues for RW management, attention, and to avoid obstacles. Pt was left resting in bed with call bell in reach, bed alarm on, telesitter on, and all needs met.    Therapy Documentation Precautions:  Precautions Precautions: Fall Precaution Comments: mild L inattention to surroundings Restrictions Weight Bearing Restrictions: No   Therapy/Group: Individual Therapy  Clide Deutscher 12/19/2022, 7:59 AM

## 2022-12-20 LAB — BASIC METABOLIC PANEL
Anion gap: 10 (ref 5–15)
BUN: 26 mg/dL — ABNORMAL HIGH (ref 8–23)
CO2: 24 mmol/L (ref 22–32)
Calcium: 8.7 mg/dL — ABNORMAL LOW (ref 8.9–10.3)
Chloride: 103 mmol/L (ref 98–111)
Creatinine, Ser: 1.4 mg/dL — ABNORMAL HIGH (ref 0.61–1.24)
GFR, Estimated: 49 mL/min — ABNORMAL LOW (ref >60)
Glucose, Bld: 98 mg/dL (ref 70–99)
Potassium: 3.9 mmol/L (ref 3.5–5.1)
Sodium: 137 mmol/L (ref 135–145)

## 2022-12-20 NOTE — Progress Notes (Signed)
Occupational Therapy Session Note  Patient Details  Name: Clayton [name] MRN: [id number] Date of Birth: [birth year]  Today's Date: 12/20/2022 OT Individual Time: 1118-1203 OT Individual Time Calculation (min): 45 min    Short Term Goals: Week 1:  OT Short Term Goal 1 (Week 1): LTG=STG 2/2 ELOS  Skilled Therapeutic Interventions/Progress Updates:     Pt received sitting up in recliner with wife present in room upon OT arrival. Pt presenting with flat affect, however to be in good spirits receptive to skilled OT session reporting 0/10 pain- OT offering intermittent rest breaks, repositioning, and therapeutic support to optimize participation in therapy session. Focus this session on d/c planning, Pt/family education, and increasing Pt's L side attention. Engaged Pt's in in discussion related to home set-up and DME needs to support improved d/c plan. Pt's wife reporting they live in single level house with stairs to enter the house and that they have a walk-in shower. Educated on options of installing grab bars and using shower seat, however Pt's wife reporting there is no space for seat in their shower. Pt's wife receptive to brining in pictures of home set-up. Pt's wife reporting increased anxiety in brining Pt's home d/t his current functional/cognitive deficits and stating Pt's does not listen to her at home and is more inclined to follow directions/education from medical professionals. Provided therapeutic support, listening, and encouragement informing Pt's wife that sessions would be focused heavily on Pt education and family training with noted improvement in moral. Encouraged Pt's wife to participate in today's session, however she politely declined d/t need for a "mental break". Engaged Pt in completing functional mobility through busy hallways while visually scanning from R to L and navigating around obstacles using RW. Pt able to complete with CGA-light min A for RW management with mod  verbal cues required for attention to avoid obstacles. Pt also noted to have intermittent L foot drop d/t decreased attention, however able to correct with mod verbal cues. In therapy gym, educated on using bring colored tape as visual anchor to support improved visual scanning to L and to utilize as a visual cues to know when he has scanned entirely to L side. Pt verbalizing understanding receptive to education. Engaged Pt in dynamic standing balance visual scanning task with functional cross midline and anterior reaching incorporated into task with Pt instructed to place/remove squigz onto vertical mirror using L hand using visual anchor to ensure all items where removed. Pt able to complete with mod question cues. Pt completed functional mobility back to room similar to previous trial. Set-up Pt's tray table with brightly colored visual anchor with education provided to both Pt and Pt's nurse tech on purpose. Pt was left resting in recliner with call bell in reach, seat belt alarm on, telesitter on, and all needs met.    Therapy Documentation Precautions:  Precautions Precautions: Fall Precaution Comments: mild L inattention to surroundings Restrictions Weight Bearing Restrictions: No   Therapy/Group: Individual Therapy  Clide Deutscher 12/20/2022, 7:56 AM

## 2022-12-20 NOTE — Progress Notes (Signed)
Physical Therapy Session Note  Patient Details  Name: Clayton [name] MRN: [id number] Date of Birth: [birth year]  Today's Date: 12/20/2022 PT Individual Time: 0920-1003 and 1335-1420 PT Individual Time Calculation (min): 43 min and 45 min  Short Term Goals: Week 1:  PT Short Term Goal 1 (Week 1): STG = LTG d/t ELOS  Skilled Therapeutic Interventions/Progress Updates:    Session 1: Pt received resting supine in bed, but easily awakens and is agreeable to therapy session. Supine>sitting L EOB, HOB slightly elevated but not using bedrail, with supervision/SBA for safety. Sitting EOB donned shoes set-up assist. Sit>stand EOB>RW with CGA - therapist adjusted height of RW for improved fit. Gait training ~270ft to main therapy gym using RW with CGA for steadying - continues to have shuffled gait with decreased L LE foot clearance compared to R, forward trunk flexed posture, and decreased gait speed. Continues to have L inattention with decreased visual scanning in that direction as well as difficulty finding RW and handrail to grab with L hand. Gait training ~74ft x2 to/from stairs, no UE support, with min A for balance (heavier assist on way back due to fatigue with pt having some minor knees giving way) - has increased postural sway with increased incoordination in L LE when not using AD. Stair navigation training ascending/descending 12 steps (6" height) using B HRs to target L hand placement on rail with light min assist for balance - reciprocal stepping pattern in both directions - cuing for turning L and L hand placement on HR.  During seated rest break pt reports feeling "lightheaded."   Assessed vitals - Sitting: BP 127/97 (MAP 108), HR 91bpm, SpO2 99% - Standing: BP 116/73 (MAP 85), HR 100bpm, SpO2 100% reports feeling "unsteady" on his feet when standing with possible slight lightheadedness, but not much and it did not worsen while standing Provided pt with water.   Dynamic gait training  with visual scanning challenge of locating numbered disks in hallway -  towards end with fatigue, pt has increased L anterior LOB with shuffled steps and unable to recover without heavy min A - pt with difficulty visually locating items, especially with busy environment and some difficulty with figure-ground discrimination.  Gait training back to room using RW with min assist at this time due to fatigue with worsening anterior trunk lean and worsening shuffled gait. Stopped for continent void of bladder on way back to room with pt requiring CGA for balance during standing void.  At end of session, pt left seated upright in recliner in the care of SLP.  Session 2: Pt received sitting in recliner with his wife, [name], present and pt eager to participate in therapy session. Pt reports need to use bathroom. Sit>stand recliner>RW with CGA for steadying. Gait training to/from bathroom using RW with CGA/light min A for balance - pt with severe L inattention/visual field cut with pt unable to locate bathroom door nor the toilet on his L without verbal/visual cuing. Requires cuing for safe AD management when opening/closing door, navigating to toilet, and managing it at a counter. Standing with CGA/min A due to tendency for anterior LOB while pt performed LB clothing management without assist and continent of bladder. Standing at sink requires cuing to locate soap on L side.   Pt's wife with extensive questions throughout session with therapist providing education as able within scope of practice on the following:  - when the therapy schedules are printed & given to patients, how to call nurse secretary to  get pt's therapy schedule for next day if needed after she leaves - plan for team conf tomorrow to discuss pt's CLOF, D/C recommendations, and follow-up recommendations - recommendation for her to participate in 1-2 days of family education/training prior to D/C  - recommendation for her to discuss with MD  regarding pt's expected recovery from medical perspective  - she reports she will be the sole caregiver for pt at D/C - they have 1 story home with STE and only L HR, she is planning to take a picture  - discussed options of follow-up HHPT vs OPPT pending pt's progress  - she confirms pt has RW at home - she inquires about pt's heart monitor - pt is not currently wearing it - notified team and Nursing Coordinator present at end of session to re-don the device - wife reports pt with baseline hx of significant BLE peripheral neuropathy with decline in his balance and gait correlating with this - she discusses pt's frequent hospitalizations this year since February 2024  Pt reports need to have BM. Sit<>stands without AD at this time for increased balance challenge. Gait in/out bathroom, no AD, with min A for balance and continued poor L attention/visual scanning to locate bathroom door and toilet despite having just performed this task <25 minutes prior. Continent of bowels and performed pericare without assist.   At end of session, pt left seated in recliner with needs in reach, seat belt alarm on, wife present, and nursing coordinator present.     Therapy Documentation Precautions:  Precautions Precautions: Fall Precaution Comments: mild L inattention to surroundings Restrictions Weight Bearing Restrictions: No   Pain:  Session 1: No reports of pain throughout session.  Session 2: No reports of pain throughout session.    Therapy/Group: Individual Therapy  Ginny Forth , PT, DPT, NCS, CSRS 12/20/2022, 7:51 AM

## 2022-12-20 NOTE — Progress Notes (Signed)
Patient ID: Clayton [name], male   DOB: [birth year], 88 y.o.   MRN: [id number]  SW met with patient spouse in the room to address questions or concerns. Spouse has some medical concerns that are currently being addressed with the PA, Pam. Sw has provided contact information.   Sw informed spouse that pt will have a covering SW the remainder of the week. Spouse will be present tomorrow during lunch.   No additional questions or concerns.

## 2022-12-20 NOTE — Progress Notes (Signed)
PROGRESS NOTE   Subjective/Complaints:  No issues overnite   ROS- unable to obtain d/t patient off of unit   Objective:   DG Swallowing Func-Speech Pathology  Result Date: 12/19/2022 Table formatting from the original result was not included. Modified Barium Swallow Study Patient Details Name: Clayton [name] MRN: [id number] Date of Birth: 1934/12/14 Today's Date: 12/19/2022 HPI/PMH: HPI: Clayton [name] is a 88 y.o. male with medical history significant of cognitive impairment, history of TBI, hypertension presenting with weakness, confusion.  Noted admission February 2024 with multifocal pneumonia that resulted in worsening cognitive impairment per the wife.  He was admitted to [facility] 12/12/22 for stroke work up. Pt. Presented to the ER afebrile, hemodynamically stable.  White count 7, hemoglobin 14.4, platelets 299, COVID flu RSV negative, ammonia level within normal limits, urinalysis not indicative of infection.  CT head within normal limits .  MRI of the brain showed scattered small acute to subacute infarcts in the right PCA distribution.  LDL 98, HDL 39.  A1c 6.1.  Patient is treated with atorvastatin, Plavix.  He previous had reaction to aspirin, could not tolerate.  Decision was made to treat with Plavix for long-term. Echocardiogram was also performed, showed normal ejection fraction without a source of thrombosis. Zio patch to be placed prior to d/c. Pt. Seen by PT,OT, SLP and they recommend CIR to assist return to PLOF. Clinical Impression: Clinical Impression: Patient presents with mild oropharyngeal post-stroke dysphagia primary characterized by slowed oral preparation of solids and reduced pharyngeal efficiency resulting in trace-mild diffuse pharyngeal residue across trials with residue increasing in amount conversely with decrease of bolus viscosity. Patient also exhibited 1x of swallow mistiming  with thin liquids on first swallow of the study resulting in trace audible aspiration which was not cleared from the airway despite attempts. During this attempt to clear the airway, patient exhibited a weakened cough response which contributes to potential risk for aspiration related sequelae. During further comprehensive challenging, no aspiration observed with thin liquids beyond the aforementioned. With thin liquids from straw, SLP observed trace transient penetration above the level of the vocal folds which cleared from the vestibule with completion of the swallow. Patient exhibits mildly reduced BOT retraction and pharyngeal stripping wave resulting in trace-mild diffuse residue of thin liquids coating pharyngeal structures. Residue did not penetrate/aspirate. Recommend continuation of regular/thin liquid diet with the following modifications: NO STRAWS, single sips only, medications administered whole in applesauce. Patient would benefit from intermittent supervision to ensure utilization of above mentioned swallow safety strategies. As patient presents with mild risk for aspiration, SLP will monitor closely for clinical symptoms of aspiration with thin liquids. Patient would benefit from dysphagia exercise program to improve pharyngeal efficiency and strength of cough response to reduce risk of aspiration and related sequelae. SLP will continue to follow per POC. Factors that may increase risk of adverse event in presence of aspiration Rubye Oaks & Clearance Coots 2021): Factors that may increase risk of adverse event in presence of aspiration Rubye Oaks & Clearance Coots 2021): Weak cough Recommendations/Plan: Swallowing Evaluation Recommendations Swallowing Evaluation Recommendations Recommendations: PO diet PO Diet Recommendation: Regular; Thin liquids (Level 0) Liquid Administration via: Spoon; Cup Medication  Administration: Whole meds with puree Supervision: Intermittent supervision/cueing for swallowing strategies; Patient  able to self-feed Swallowing strategies  : Slow rate; Small bites/sips Postural changes: Position pt fully upright for meals; Stay upright 30-60 min after meals Oral care recommendations: Oral care BID (2x/day) Treatment Plan Treatment Plan Treatment recommendations: Therapy as outlined in treatment plan below Follow-up recommendations: Home health SLP Functional status assessment: Patient has had a recent decline in their functional status and demonstrates the ability to make significant improvements in function in a reasonable and predictable amount of time. Treatment frequency: Min 3x/week Treatment duration: 1 week Interventions: Aspiration precaution training; Oropharyngeal exercises; Patient/family education; Diet toleration management by SLP; Respiratory muscle strength training Recommendations Recommendations for follow up therapy are one component of a multi-disciplinary discharge planning process, led by the attending physician.  Recommendations may be updated based on patient status, additional functional criteria and insurance authorization. Assessment: Orofacial Exam: Orofacial Exam Oral Cavity: Oral Hygiene: WFL Oral Cavity - Dentition: Poor condition; Missing dentition Orofacial Anatomy: WFL Oral Motor/Sensory Function: WFL Anatomy: Anatomy: WFL Boluses Administered: Boluses Administered Boluses Administered: Thin liquids (Level 0); Mildly thick liquids (Level 2, nectar thick); Moderately thick liquids (Level 3, honey thick); Puree; Solid  Oral Impairment Domain: Oral Impairment Domain Lip Closure: No labial escape Tongue control during bolus hold: Cohesive bolus between tongue to palatal seal Bolus preparation/mastication: Slow prolonged chewing/mashing with complete recollection Bolus transport/lingual motion: Brisk tongue motion Oral residue: Trace residue lining oral structures Location of oral residue : Tongue; Palate Initiation of pharyngeal swallow : Valleculae  Pharyngeal Impairment Domain:  Pharyngeal Impairment Domain Soft palate elevation: No bolus between soft palate (SP)/pharyngeal wall (PW) Laryngeal elevation: Complete superior movement of thyroid cartilage with complete approximation of arytenoids to epiglottic petiole Anterior hyoid excursion: Complete anterior movement Epiglottic movement: Complete inversion Laryngeal vestibule closure: Complete, no air/contrast in laryngeal vestibule Pharyngeal stripping wave : Present - diminished Pharyngeal contraction (A/P view only): N/A Pharyngoesophageal segment opening: Partial distention/partial duration, partial obstruction of flow (With large boluses of thin only) Tongue base retraction: Trace column of contrast or air between tongue base and PPW Pharyngeal residue: Collection of residue within or on pharyngeal structures Location of pharyngeal residue: Valleculae; Pyriform sinuses; Pharyngeal wall  Esophageal Impairment Domain: Esophageal Impairment Domain Esophageal clearance upright position: Complete clearance, esophageal coating Pill: Pill Consistency administered: -- (not tested) Penetration/Aspiration Scale Score: Penetration/Aspiration Scale Score 1.  Material does not enter airway: Mildly thick liquids (Level 2, nectar thick); Moderately thick liquids (Level 3, honey thick); Puree; Solid 2.  Material enters airway, remains ABOVE vocal cords then ejected out: Thin liquids (Level 0) 7.  Material enters airway, passes BELOW cords and not ejected out despite cough attempt by patient: Thin liquids (Level 0) Compensatory Strategies: Compensatory Strategies Compensatory strategies: No   General Information: Caregiver present: No  Diet Prior to this Study: Regular; Thin liquids (Level 0)   Temperature : Normal   Respiratory Status: WFL   Supplemental O2: None (Room air)   History of Recent Intubation: No  Behavior/Cognition: Alert; Cooperative; Confused Self-Feeding Abilities: Able to self-feed Baseline vocal quality/speech: Normal Volitional  Cough: Able to elicit Volitional Swallow: Able to elicit Exam Limitations: No limitations Goal Planning: Prognosis for improved oropharyngeal function: Fair Barriers to Reach Goals: Cognitive deficits No data recorded No data recorded Consulted and agree with results and recommendations: Pt unable/family or caregiver not available Pain: Pain Assessment Pain Assessment: No/denies pain End of Session: Start Time:No data recorded Stop Time: No data recorded  Time Calculation:No data recorded Charges: No data recorded SLP visit diagnosis: SLP Visit Diagnosis: Dysphagia, oropharyngeal phase (R13.12) Past Medical History: Past Medical History: Diagnosis Date  Acute gastrointestinal hemorrhage   Acute hypoxemic respiratory failure (HCC) 05/25/2022  Acute thrombotic stroke (HCC) 12/14/2022  Basal cell carcinoma   Blood in stool 08/30/2018  Blood transfusion without reported diagnosis 20 (age 25)  After traumatic brain injury  Broken ribs   2 BROKEN ON RIBS  CKD (chronic kidney disease)   PT DENIES  Color blindness   DVT (deep venous thrombosis) (HCC)   RIGHT LEG  Dyslexia   Fracture dislocation of wrist   RIGHT  GI bleed 09/02/2018  Hiatal hernia with GERD   HTN (hypertension)   Hypokalemia 05/26/2022  Hyponatremia 05/25/2022  Symptomatic anemia 09/03/2018  TBI (traumatic brain injury) (HCC)   w/skull Fx and crani in 1960s Past Surgical History: Past Surgical History: Procedure Laterality Date  COLONOSCOPY  2014  cleared for 5 yrs  COLONOSCOPY N/A 09/01/2018  Procedure: COLONOSCOPY;  Surgeon: Pasty Spillers, MD;  Location: [facility] ENDOSCOPY;  Service: Endoscopy;  Laterality: N/A;  CRANIECTOMY FOR DEPRESSED SKULL FRACTURE  1960'S  ESOPHAGOGASTRODUODENOSCOPY N/A 09/01/2018  Procedure: ESOPHAGOGASTRODUODENOSCOPY (EGD);  Surgeon: Pasty Spillers, MD;  Location: [facility] ENDOSCOPY;  Service: Endoscopy;  Laterality: N/A;  HERNIA REPAIR    X2  IVC FILTER REMOVAL N/A 07/03/2017  Procedure: IVC FILTER REMOVAL;  Surgeon: Annice Needy, MD;  Location: [facility] INVASIVE CV LAB;  Service: Cardiovascular;  Laterality: N/A;  LUMBAR LAMINECTOMY/DECOMPRESSION MICRODISCECTOMY N/A 04/20/2020  Procedure: L2/3, L3/4 LAMINECTOMY;  Surgeon: Lucy Chris, MD;  Location: [facility] ORS;  Service: Neurosurgery;  Laterality: N/A;  2ND CASE  PERIPHERAL VASCULAR THROMBECTOMY Right 05/22/2017  Procedure: PERIPHERAL VASCULAR THROMBECTOMY;  Surgeon: Annice Needy, MD;  Location: [facility] INVASIVE CV LAB;  Service: Cardiovascular;  Laterality: Right;  SPINE SURGERY  December 2021  TONSILLECTOMY   Jeannie Done, M.A., CCC-SLP Yetta Barre 12/19/2022, 10:28 AM  Recent Labs    12/19/22 0556  WBC 9.6  HGB 14.3  HCT 42.8  PLT 304   Recent Labs    12/19/22 0556 12/20/22 0604  NA 138 137  K 3.5 3.9  CL 106 103  CO2 23 24  GLUCOSE 100* 98  BUN 21 26*  CREATININE 1.40* 1.40*  CALCIUM 8.5* 8.7*    Intake/Output Summary (Last 24 hours) at 12/20/2022 0803 Last data filed at 12/20/2022 0414 Gross per 24 hour  Intake 1192 ml  Output --  Net 1192 ml        Physical Exam: Vital Signs Blood pressure 119/72, pulse 92, temperature 97.6 F (36.4 C), resp. rate 18, height 5\' 7"  (1.702 m), SpO2 98%.  Prior exams: General: No acute distress Mood and affect are appropriate Heart: Regular rate and rhythm no rubs murmurs or extra sounds Lungs: Clear to auscultation, breathing unlabored, no rales or wheezes Abdomen: Positive bowel sounds, soft nontender to palpation, nondistended Extremities: No clubbing, cyanosis, or edema Skin: No evidence of breakdown, no evidence of rash Neurologic: Cranial nerves II through XII intact, motor strength is 5/5 in right 4/5 left deltoid, bicep, tricep, grip, hip flexor, knee extensors, ankle dorsiflexor and plantar flexor  Left homonymous hemianopsia Orient to person and hospital but in "[place]" oriented to day of week but not month Musculoskeletal: Full range of motion in all 4 extremities. No joint  swelling    Assessment/Plan: 1. Functional deficits which require 3+ hours per day of interdisciplinary therapy in a comprehensive  inpatient rehab setting. Physiatrist is providing close team supervision and 24 hour management of active medical problems listed below. Physiatrist and rehab team continue to assess barriers to discharge/monitor patient progress toward functional and medical goals  Care Tool:  Bathing    Body parts bathed by patient: Right arm, Left arm, Abdomen, Chest, Buttocks, Front perineal area, Left upper leg, Right upper leg, Right lower leg, Left lower leg, Face         Bathing assist Assist Level: Minimal Assistance - Patient > 75%     Upper Body Dressing/Undressing Upper body dressing   What is the patient wearing?: Pull over shirt    Upper body assist Assist Level: Minimal Assistance - Patient > 75%    Lower Body Dressing/Undressing Lower body dressing      What is the patient wearing?: Underwear/pull up, Pants     Lower body assist Assist for lower body dressing: Minimal Assistance - Patient > 75%     Toileting Toileting    Toileting assist Assist for toileting: Minimal Assistance - Patient > 75%     Transfers Chair/bed transfer  Transfers assist     Chair/bed transfer assist level: Minimal Assistance - Patient > 75%     Locomotion Ambulation   Ambulation assist      Assist level: Contact Guard/Touching assist Assistive device: Walker-rolling Max distance: 165 ft   Walk 10 feet activity   Assist     Assist level: Contact Guard/Touching assist Assistive device: Walker-rolling   Walk 50 feet activity   Assist    Assist level: Contact Guard/Touching assist Assistive device: Walker-rolling    Walk 150 feet activity   Assist    Assist level: Contact Guard/Touching assist Assistive device: Walker-rolling    Walk 10 feet on uneven surface  activity   Assist Walk 10 feet on uneven surfaces activity did not  occur: Safety/medical concerns         Wheelchair     Assist Is the patient using a wheelchair?: Yes Type of Wheelchair: Manual    Wheelchair assist level: Dependent - Patient 0% Max wheelchair distance: 300 ft    Wheelchair 50 feet with 2 turns activity    Assist        Assist Level: Dependent - Patient 0%   Wheelchair 150 feet activity     Assist      Assist Level: Dependent - Patient 0%   Blood pressure 119/72, pulse 92, temperature 97.6 F (36.4 C), resp. rate 18, height 5\' 7"  (1.702 m), SpO2 98%.  Medical Problem List and Plan: 1. Functional deficits secondary to R PCA thrombotic CVA             -patient may shower             -ELOS/Goals: 10-12 days, SPV PT/OT/SLP  2.  Antithrombotics: -DVT/anticoagulation:  Pharmaceutical: Lovenox             -antiplatelet therapy: Plavix  3. Pain Management:  Tylenol prn.   - 0 pain reported on nursing assessments; monitor  4. Mood/Behavior/Sleep/at bedtime aggitation:  LCSW to follow for evaluation and support.              -antipsychotic agents: N/A             -Seroquel for insomnia   - 8/19: Increase at bedtime seroquel to 75 mg, add PRN trazodone 50 mg for sleep   5. Neuropsych/cognition: This patient is not capable of making decisions on his own behalf.              -  Delirium precautions, telesitter and bed alarm, utilize aides for orientation (hearing aides, glasses)  6. Skin/Wound Care: Routine pressure relief measures.  7. Fluids/Electrolytes/Nutrition: Monitor I/O. Check CMET in am. Baseline OP Cr appears 1.1-1.2.   - 8/19: BUN down, Cr up slightly; Encourage Po fluids, monitor I/Os, recheck in AM     Latest Ref Rng & Units 12/20/2022    6:04 AM 12/19/2022    5:56 AM 12/16/2022    6:15 AM  BMP  Glucose 70 - 99 mg/dL 98  253  664   BUN 8 - 23 mg/dL 26  21  25    Creatinine 0.61 - 1.24 mg/dL 4.03  4.74  2.59   Sodium 135 - 145 mmol/L 137  138  137   Potassium 3.5 - 5.1 mmol/L 3.9  3.5  3.9    Chloride 98 - 111 mmol/L 103  106  106   CO2 22 - 32 mmol/L 24  23  22    Calcium 8.9 - 10.3 mg/dL 8.7  8.5  8.6     8. HTN: Monitor BP TID--not on any meds PTA.  - well controlled, monitor Vitals:   12/19/22 1949 12/20/22 0400  BP: 126/61 119/72  Pulse: 83 92  Resp: 15 18  Temp: 98 F (36.7 C) 97.6 F (36.4 C)  SpO2: 97% 98%    9. H/o GIB X 2: Will resume Protonix and monitor stool for occult blood.             --monitor H/H   - HgB stable, LBM 8/18 large nonbloody; monitor     Latest Ref Rng & Units 12/19/2022    5:56 AM 12/16/2022    6:15 AM 12/12/2022    7:49 AM  CBC  WBC 4.0 - 10.5 K/uL 9.6  8.3  7.0   Hemoglobin 13.0 - 17.0 g/dL 56.3  87.5  64.3   Hematocrit 39.0 - 52.0 % 42.8  44.0  44.9   Platelets 150 - 400 K/uL 304  281  299     10. Anxiety d/o: Continue Buspar bid    11. Cardiac monitoring: Has Zio patch/event monitor. Wife with multiple questions PI:RJJO,ACZ, care.              Dr Juliann Pares is cardiologist    LOS: 5 days A FACE TO FACE EVALUATION WAS PERFORMED  Erick Colace 12/20/2022, 8:03 AM

## 2022-12-20 NOTE — Progress Notes (Signed)
Speech Language Pathology Daily Session Note  Patient Details  Name: Clayton [name] MRN: [id number] Date of Birth: [birth year]  Today's Date: 12/20/2022 SLP Individual Time: 1003-1100 SLP Individual Time Calculation (min): 57 min  Short Term Goals: Week 1: SLP Short Term Goal 1 (Week 1): STGs=LTGs 2* ELOS  Skilled Therapeutic Interventions: Skilled therapy session focused on cognitive and communication goals. SLP facilitated session by providing education to wife and patient regarding memory notebook and calendar for orientation. With utilization of calendar, patient able to recall date/day/month/year with supervision A and days events with modI A. SLP reviewed speech intelligibility strategies and conversed with patient regarding PLOF and hobbies. Patient/wife dicussed patients passion for wood working. Patient ~80% intelligible at the conversational level this session. Wife reported concerns about poor respiratory support. SLP provided minA during diaphragmatic breathing and education regarding RMST device. Plan to initiate. Patient left in chair with alarm set and call bell in reach. Continue POC.   Pain Pain Assessment Pain Scale: 0-10 Pain Score: 0-No pain  Therapy/Group: Individual Therapy  Clayton [name] M.A., CF-SLP 12/20/2022, 12:19 PM

## 2022-12-20 NOTE — Plan of Care (Signed)
  Problem: RH Expression Communication Goal: LTG Patient will increase speech intelligibility (SLP) Description: LTG: Patient will increase speech intelligibility at word/phrase/conversation level with cues, % of the time (SLP) Flowsheets (Taken 12/20/2022 1226) LTG: Patient will increase speech intelligibility (SLP): Modified Independent Level: Conversation level Percent of time patient will use intelligible speech: 80

## 2022-12-21 NOTE — Progress Notes (Signed)
Speech Language Pathology Daily Session Note  Patient Details  Name: Clayton [name] MRN: [id number] Date of Birth: 09/24/1934  Today's Date: 12/21/2022 SLP Individual Time: 0800-0900 SLP Individual Time Calculation (min): 60 min  Short Term Goals: Week 1: SLP Short Term Goal 1 (Week 1): STGs=LTGs 2* ELOS  Skilled Therapeutic Interventions: Skilled therapy session focused on respiratory support, dysphagia and cognitive goals. SLP provided education regarding respiratory support exercises and initiated use of Expiratory Muscle Strength training device. Patient requiring supervision A to utilize device and complete diaphragmatic breathing exercises. SLP addressed dysphagia through supervision A to utilize swallowing strategies including no straws and small bites/sips during consumption of breakfast tray. Patient reported preference of medications administered whole in water and did so with no s/sx of aspiration. Continue current diet. SLP addressed cognitive goals through use of calendar to aid in orientation to time/date and modA to attend to L side of room and breakfast tray. Patient left in bed with call bell in hand and alarm set. Continue POC.   Pain Denies Pain  Therapy/Group: Individual Therapy  Jalan Fariss M.A., CF-SLP 12/21/2022, 10:42 AM

## 2022-12-21 NOTE — Progress Notes (Signed)
Physical Therapy Session Note  Patient Details  Name: Clayton [name] MRN: [id number] Date of Birth: [birth year]  Today's Date: 12/21/2022 PT Individual Time: 4098-1191 and 4782-9562 PT Individual Time Calculation (min): 27 min and 62 min  Short Term Goals: Week 1:  PT Short Term Goal 1 (Week 1): STG = LTG d/t ELOS  Skilled Therapeutic Interventions/Progress Updates:    Session 1: Pt received sitting in w/c with his wife, Clayton [name], present and pt agreeable to therapy session, but reporting some fatigue. Sit<>stands, no AD, with CGA/light min A for steadying during session. Gait training ~150-22ft x3reps, starting with L HHA on 1st walk progressed to no UE support, with focus on dynamic gait training and visual scanning to L - towards end of final walk pt demos minor L anterior LOB due to onset of fatigue. Overall demos improving reciprocal stepping pattern, increased step lengths and foot clearances bilaterally with no significant shuffle noted. Therapist educating pt on stopping and visually scanning left and right at EVERY doorway/intersection to ensure he "looks both ways" prior to navigating through them to ensure his safety due to L inattention. Stair navigation training ascending/descending 12 steps (6" height) using L HR only to simulate home set-up with min assist for balance - pt self selects reciprocal stepping pattern with some weakness noted when powering up through L LE on ascent but no more assist provided - demos improving ability to find L handrail with L hand during ascent but still requires verbal cuing. Pt reports he feels muscle fatigue with this activity. Therapist educates pt's and wife on recommendation to start with HHPT at D/C with goal of progressing to OPPT as pt has generalized weakness and deconditioning from recurrent hospitalizations this past year. Therapist also educated pt on recommendation for him to use AD at D/C but that practicing without it during therapy for gait  challenge. At end of session, pt left seated in w/c with needs in reach, seat belt alarm on, and his wife present.   Session 2: Pt received supine in bed resting with his wife, Clayton [name], present and pt agreeable to therapy session. Supine>sitting L EOB, HOB flat and not using bedrail, supervision. Sitting EOB donned shoes set-up assist. Sit<>stands, no AD, with CGA for steadying throughout session, except on initial stand when pt has anterior LOB requiring min A to recover. Gait training in/out bathroom, no AD, with min A for balance and pt continuing to have significant L inattention with impaired cognition impacting his ability to recall and carryover education/experiences on locating the bathroom on his L. Standing with CGA for safety, performed LB clothing management without assist and continent of bladder.   Pt's wife present throughout session for education and asks excellent questions to gain insight into pt's deficits. Pt's wife provided pictures of stairs for home entry with pt having 6 brick STE with only L HR that are ~6.5 inches tall.  Gait training ~275ft to main therapy gym, no AD, with light min A for balance - achieves reciprocal stepping pattern with adequate gait speed although mild imbalance - continues to have significant L inattention with education/training for recall of "looking both ways" with focus on L when going through doorways/intersections.  Dynamic gait training in gym, no AD, with dual-task challenge of locating 10 orange cones - requires min A for balance throughout and pt requires significantly increased time to visually scan environment with mod/max cuing to scan towards L. This included navigating up/down 4 stairs to pick-up a cone with pt  requiring cuing/education for safety with this due to pt trying to step backwards down steps and turn around in the middle of the stairs.  Throughout session, therapist provided repeated education on his L inattention and goal of him  self-cuing to visually scan in that direction with decreased reliance on therapy/staff to cue him, but pt with severely impaired short term recall of this education.  Stair navigation training ascending/descending 11 steps (6" height) in stairwell using L HR only to simulate home environment with light min assist for balance - pt continues to self-select reciprocal stepping pattern and appears safe and is a more automatic motor plan so therapist not cuing to change at this time.   Dynamic balance and L attention dual- task of picking up colored clothespins on command, stepping up onto 4" step and placing clothespin on a line to the far L, then stepping backwards down off the step - requires frequent L HHA and min A for balance primarily when stepping backwards off the step.  Gait training back to his room, no AD, with light min A and focus on reinforcement of L visual scanning to locate his room with mod cuing.   Sit>supine supervision. Pt left supine in bed with needs in reach, bed alarm on, and his wife present.    Therapy Documentation Precautions:  Precautions Precautions: Fall Precaution Comments: mild L inattention to surroundings Restrictions Weight Bearing Restrictions: No   Pain: Session 1: No reports of pain throughout session.  Session 2: No reports of pain throughout session.    Therapy/Group: Individual Therapy  Ginny Forth , PT, DPT, NCS, CSRS 12/21/2022, 11:37 AM

## 2022-12-21 NOTE — Patient Care Conference (Signed)
Inpatient RehabilitationTeam Conference and Plan of Care Update Date: 12/21/2022   Time: 10:47 AM    Patient Name: Clayton [name]      Medical Record Number: [id number]  Date of Birth: December 15, 1934 Sex: Male         Room/Bed: 4W25C/4W25C-01 Payor Info: Payor: [employer] MEDICARE / Plan: [employer] MEDICARE CHOICE PPO / Product Type: *No Product type* /    Admit Date/Time:  12/15/2022 12:55 PM  Primary Diagnosis:  Stroke (cerebrum) [facility])  Hospital Problems: Principal Problem:   Stroke (cerebrum) [facility])    Expected Discharge Date: Expected Discharge Date: 12/29/22  Team Members Present: Physician leading conference: Dr. Claudette Laws Social Worker Present: Cecile Sheerer, LCSWA Nurse Present: Chana Bode, RN PT Present: Casimiro Needle, PT OT Present: Bonnell Public, OT SLP Present: Everardo Pacific, SLP PPS Coordinator present : Fae Pippin, SLP     Current Status/Progress Goal Weekly Team Focus  Bowel/Bladder   Continent of B/B   Remain continent   Assist with toileting as needed    Swallow/Nutrition/ Hydration   reg/thin no straws   supervisionA  continue to educate regarding swallowing strategies    ADL's   Overall supervision UB BADLs, LB CGA; CGA-min A functional transfer to shower or toilet using RW with max verbal cues for attention to L side and safety awareness (cues for attending to L foot placement and postural alignment d/t L lean)   mod I-superivsion   Barriers- wife is anxious and overwhelmed, need for increased family education as wife will most likely need more than 1 family education session; safety awareness, L side attention, functional cognition, RW management during transfers, dynamic balance, and activity tolerance    Mobility   supervision bed mobility, CGA sit<>stands and stand pivots using RW vs light min A without AD, gait up to 254ft using RW with min A for balance due to progressively worsening L anterior lean and shuffled gait pattern, min A 12  stairs using HRs   mod-I/supervision overall at ambulatory level  dynamic balance, dynamic gait training, L attention, stair navigation, safety awareness, endurance; barriers: need to progress balance, endurance, and gait with LRAD    Communication   minA to utilize strategies and diaphragmatic breathing exercises   modI   continue education and RMST device to increase lung support    Safety/Cognition/ Behavioral Observations  requires calendar/external aids for orientation, wife reports difficulties in memory increased since admission, poor awareness   minA   memory notebook, memory strategies, further family/patient education    Pain   No c/o pain   Pain <3/10   Assess Qshift and prn    Skin   Skin intact   Maintain skin integrity  assess Qshift and prn      Discharge Planning:  Dicharging home with spouse to assist 24/7 at [facility] A level.   Team Discussion: Patient post right PCA CVA with left neglect and field cut with cognitive deficits and poor endurance. Limited by postural alignment deficits,left lean and shuffled gait.  Patient on target to meet rehab goals: yes, currently needs supervison for upper body ADLs and CGA- min assist for lower body care. Needs min assist to ambulate with a RW and CGA for transfers.    *See Care Plan and progress notes for long and short-term goals.   Revisions to Treatment Plan:  RMS training Memory Book   Teaching Needs: Safety, medications, dietary modifications, transfers, toileting, etc. ZIO patch through 01/13/23   Current Barriers to Discharge: Decreased caregiver support  and Home enviroment access/layout  Possible Resolutions to Barriers: Family education Left handrail at home     Medical Summary Current Status: remains confused , pt pulls off Zio patch, Left  neglect and inattention  Barriers to Discharge: Other (comments)  Barriers to Discharge Comments: field cut, home accessibility concerns, diabetic neuropathy  causing balance issues Possible Resolutions to [employer] Focus: Wife to take pictures of entry way at home   Continued Need for Acute Rehabilitation Level of Care: The patient requires daily medical management by a physician with specialized training in physical medicine and rehabilitation for the following reasons: Direction of a multidisciplinary physical rehabilitation program to maximize functional independence : Yes Medical management of patient stability for increased activity during participation in an intensive rehabilitation regime.: Yes Analysis of laboratory values and/or radiology reports with any subsequent need for medication adjustment and/or medical intervention. : Yes   I attest that I was present, lead the team conference, and concur with the assessment and plan of the team.   Chana Bode B 12/21/2022, 2:15 PM

## 2022-12-21 NOTE — Progress Notes (Signed)
Occupational Therapy Session Note  Patient Details  Name: Clayton [name] MRN: [id number] Date of Birth: Sep 02, 1934  Today's Date: 12/21/2022 OT Individual Time: 0901-1000 OT Individual Time Calculation (min): 59 min    Short Term Goals: Week 1:  OT Short Term Goal 1 (Week 1): LTG=STG 2/2 ELOS  Skilled Therapeutic Interventions/Progress Updates:     Pt received lightly sleeping in bed with wife present in room. Pt presenting with flat affect, however to be in good spirits receptive to skilled OT session reporting 0/10 pain- OT offering intermittent rest breaks, repositioning, and therapeutic support to optimize participation in therapy session. MD in/out for morning rounding. Pt noted to be A&O x1 with Pt disoriented to current location and situation with gentle education and reorientation provided. Pt's wife providing pictures of home set-up this session with measurements and DME provided in the pictures. Engaged Pt's wife in discussion related to home set-up and option of installing grab bars vs using suction cup grab bars to increase Pt's safety, DME options, and simple home modifications. Provided gentle education on CVA etiology/recovery, energy conservation, and fall prevention. Focused remainder of session on BADL retraining with emphasis on safety and L side attention. Pt's wife participated throughout session through observation and light hands physical training to allow opportunity to obtain insight into Pt's deficits and opportunity to ask questions throughout session. Pt transitioned from supine>EOB with supervision. Sit>stand using RW CGA. Pt completed functional mobility to bathroom with mod verbal cues required to turn head to L side to locate bathroom. Pt transferred to toilet using RW and grab bars with light min A for RW management and mod verbal cues for technique. Pt able to doff pants CGA. Increased time provided on toilet- +continent void documented in flowsheets. Doffed  clothing seated on toilet with CGA and education provided on completing task in seated position to avoid falls with both Pt and caregiver receptive to education. Pt compelted ambulatory transfer into shower with light min A for RW management and mod verbal cues for attention, technique, and safety. During shower, OT and toiletry items positioned on L side to increase Pt's attention to L with Pt able to locate items and compensate with mod verbal cues. Pt completed functional mobility back to room with RW CGA. Pt completed U/LB dressing seated EOB UB supervision and LB CGA when standing using his RW for balance. At end of session set up environment to support increase attention to L side with Pt's wife sitting in L visual field. Discussed follow-up therapy recommendations with Pt's wife and she was receptive to beginning with HHOT and progressing to OPOT services as Pt progresses. Pt was left resting in wc with call bell in reach, seat belt alarm on, telesitter on, and all needs met.    Therapy Documentation Precautions:  Precautions Precautions: Fall Precaution Comments: mild L inattention to surroundings Restrictions Weight Bearing Restrictions: No  Therapy/Group: Individual Therapy  Clide Deutscher 12/21/2022, 9:22 AM

## 2022-12-21 NOTE — Progress Notes (Signed)
PROGRESS NOTE   Subjective/Complaints:  No issues overnite , wife at bedside , would like some questions answered  Discussed prognosis , deficits , compensation, f/u therapy, no driving , need for visual field testing in future   ROS- no CP, SOB, N/V/D   Objective:   DG Swallowing Func-Speech Pathology  Result Date: 12/19/2022 Table formatting from the original result [name] not included. Modified Barium Swallow Study Patient Details Name: Clayton [name] MRN: [id number] Date of Birth: December 03, 1934 Today's Date: 12/19/2022 HPI/PMH: HPI: Clayton [name] is a 88 y.o. male with medical history significant of cognitive impairment, history of TBI, hypertension presenting with weakness, confusion.  Noted admission February 2024 with multifocal pneumonia that resulted in worsening cognitive impairment per the wife.  He [name] admitted to [facility] 12/12/22 for stroke work up. Pt. Presented to the ER afebrile, hemodynamically stable.  White count 7, hemoglobin 14.4, platelets 299, COVID flu RSV negative, ammonia level within normal limits, urinalysis not indicative of infection.  CT head within normal limits .  MRI of the brain showed scattered small acute to subacute infarcts in the right PCA distribution.  LDL 98, HDL 39.  A1c 6.1.  Patient is treated with atorvastatin, Plavix.  He previous had reaction to aspirin, could not tolerate.  Decision [name] made to treat with Plavix for long-term. Echocardiogram [name] also performed, showed normal ejection fraction without a source of thrombosis. Zio patch to be placed prior to d/c. Pt. Seen by PT,OT, SLP and they recommend CIR to assist return to PLOF. Clinical Impression: Clinical Impression: Patient presents with mild oropharyngeal post-stroke dysphagia primary characterized by slowed oral preparation of solids and reduced pharyngeal efficiency resulting in trace-mild diffuse pharyngeal  residue across trials with residue increasing in amount conversely with decrease of bolus viscosity. Patient also exhibited 1x of swallow mistiming with thin liquids on first swallow of the study resulting in trace audible aspiration which [name] not cleared from the airway despite attempts. During this attempt to clear the airway, patient exhibited a weakened cough response which contributes to potential risk for aspiration related sequelae. During further comprehensive challenging, no aspiration observed with thin liquids beyond the aforementioned. With thin liquids from straw, SLP observed trace transient penetration above the level of the vocal folds which cleared from the vestibule with completion of the swallow. Patient exhibits mildly reduced BOT retraction and pharyngeal stripping wave resulting in trace-mild diffuse residue of thin liquids coating pharyngeal structures. Residue did not penetrate/aspirate. Recommend continuation of regular/thin liquid diet with the following modifications: NO STRAWS, single sips only, medications administered whole in applesauce. Patient would benefit from intermittent supervision to ensure utilization of above mentioned swallow safety strategies. As patient presents with mild risk for aspiration, SLP will monitor closely for clinical symptoms of aspiration with thin liquids. Patient would benefit from dysphagia exercise program to improve pharyngeal efficiency and strength of cough response to reduce risk of aspiration and related sequelae. SLP will continue to follow per POC. Factors that may increase risk of adverse event in presence of aspiration Rubye Oaks & Clearance Coots 2021): Factors that may increase risk of adverse event in presence of aspiration Rubye Oaks & Clearance Coots 2021): Weak  cough Recommendations/Plan: Swallowing Evaluation Recommendations Swallowing Evaluation Recommendations Recommendations: PO diet PO Diet Recommendation: Regular; Thin liquids (Level 0) Liquid  Administration via: Spoon; Cup Medication Administration: Whole meds with puree Supervision: Intermittent supervision/cueing for swallowing strategies; Patient able to self-feed Swallowing strategies  : Slow rate; Small bites/sips Postural changes: Position pt fully upright for meals; Stay upright 30-60 min after meals Oral care recommendations: Oral care BID (2x/day) Treatment Plan Treatment Plan Treatment recommendations: Therapy as outlined in treatment plan below Follow-up recommendations: Home health SLP Functional status assessment: Patient has had a recent decline in their functional status and demonstrates the ability to make significant improvements in function in a reasonable and predictable amount of time. Treatment frequency: Min 3x/week Treatment duration: 1 week Interventions: Aspiration precaution training; Oropharyngeal exercises; Patient/family education; Diet toleration management by SLP; Respiratory muscle strength training Recommendations Recommendations for follow up therapy are one component of a multi-disciplinary discharge planning process, led by the attending physician.  Recommendations may be updated based on patient status, additional functional criteria and insurance authorization. Assessment: Orofacial Exam: Orofacial Exam Oral Cavity: Oral Hygiene: WFL Oral Cavity - Dentition: Poor condition; Missing dentition Orofacial Anatomy: WFL Oral Motor/Sensory Function: WFL Anatomy: Anatomy: WFL Boluses Administered: Boluses Administered Boluses Administered: Thin liquids (Level 0); Mildly thick liquids (Level 2, nectar thick); Moderately thick liquids (Level 3, honey thick); Puree; Solid  Oral Impairment Domain: Oral Impairment Domain Lip Closure: No labial escape Tongue control during bolus hold: Cohesive bolus between tongue to palatal seal Bolus preparation/mastication: Slow prolonged chewing/mashing with complete recollection Bolus transport/lingual motion: Brisk tongue motion Oral  residue: Trace residue lining oral structures Location of oral residue : Tongue; Palate Initiation of pharyngeal swallow : Valleculae  Pharyngeal Impairment Domain: Pharyngeal Impairment Domain Soft palate elevation: No bolus between soft palate (SP)/pharyngeal wall (PW) Laryngeal elevation: Complete superior movement of thyroid cartilage with complete approximation of arytenoids to epiglottic petiole Anterior hyoid excursion: Complete anterior movement Epiglottic movement: Complete inversion Laryngeal vestibule closure: Complete, no air/contrast in laryngeal vestibule Pharyngeal stripping wave : Present - diminished Pharyngeal contraction (A/P view only): N/A Pharyngoesophageal segment opening: Partial distention/partial duration, partial obstruction of flow (With large boluses of thin only) Tongue base retraction: Trace column of contrast or air between tongue base and PPW Pharyngeal residue: Collection of residue within or on pharyngeal structures Location of pharyngeal residue: Valleculae; Pyriform sinuses; Pharyngeal wall  Esophageal Impairment Domain: Esophageal Impairment Domain Esophageal clearance upright position: Complete clearance, esophageal coating Pill: Pill Consistency administered: -- (not tested) Penetration/Aspiration Scale Score: Penetration/Aspiration Scale Score 1.  Material does not enter airway: Mildly thick liquids (Level 2, nectar thick); Moderately thick liquids (Level 3, honey thick); Puree; Solid 2.  Material enters airway, remains ABOVE vocal cords then ejected out: Thin liquids (Level 0) 7.  Material enters airway, passes BELOW cords and not ejected out despite cough attempt by patient: Thin liquids (Level 0) Compensatory Strategies: Compensatory Strategies Compensatory strategies: No   General Information: Caregiver present: No  Diet Prior to this Study: Regular; Thin liquids (Level 0)   Temperature : Normal   Respiratory Status: WFL   Supplemental O2: None (Room air)   History of  Recent Intubation: No  Behavior/Cognition: Alert; Cooperative; Confused Self-Feeding Abilities: Able to self-feed Baseline vocal quality/speech: Normal Volitional Cough: Able to elicit Volitional Swallow: Able to elicit Exam Limitations: No limitations Goal Planning: Prognosis for improved oropharyngeal function: Fair Barriers to Reach Goals: Cognitive deficits No data recorded No data recorded Consulted and agree with results and recommendations:  Pt unable/family or caregiver not available Pain: Pain Assessment Pain Assessment: No/denies pain End of Session: Start Time:No data recorded Stop Time: No data recorded Time Calculation:No data recorded Charges: No data recorded SLP visit diagnosis: SLP Visit Diagnosis: Dysphagia, oropharyngeal phase (R13.12) Past Medical History: Past Medical History: Diagnosis Date  Acute gastrointestinal hemorrhage   Acute hypoxemic respiratory failure (HCC) 05/25/2022  Acute thrombotic stroke (HCC) 12/14/2022  Basal cell carcinoma   Blood in stool 08/30/2018  Blood transfusion without reported diagnosis 87 (age 62)  After traumatic brain injury  Broken ribs   2 BROKEN ON RIBS  CKD (chronic kidney disease)   PT DENIES  Color blindness   DVT (deep venous thrombosis) (HCC)   RIGHT LEG  Dyslexia   Fracture dislocation of wrist   RIGHT  GI bleed 09/02/2018  Hiatal hernia with GERD   HTN (hypertension)   Hypokalemia 05/26/2022  Hyponatremia 05/25/2022  Symptomatic anemia 09/03/2018  TBI (traumatic brain injury) (HCC)   w/skull Fx and crani in 1960s Past Surgical History: Past Surgical History: Procedure Laterality Date  COLONOSCOPY  2014  cleared for 5 yrs  COLONOSCOPY N/A 09/01/2018  Procedure: COLONOSCOPY;  Surgeon: Pasty Spillers, MD;  Location: [facility] ENDOSCOPY;  Service: Endoscopy;  Laterality: N/A;  CRANIECTOMY FOR DEPRESSED SKULL FRACTURE  1960'S  ESOPHAGOGASTRODUODENOSCOPY N/A 09/01/2018  Procedure: ESOPHAGOGASTRODUODENOSCOPY (EGD);  Surgeon: Pasty Spillers, MD;   Location: [facility] ENDOSCOPY;  Service: Endoscopy;  Laterality: N/A;  HERNIA REPAIR    X2  IVC FILTER REMOVAL N/A 07/03/2017  Procedure: IVC FILTER REMOVAL;  Surgeon: Annice Needy, MD;  Location: [facility] INVASIVE CV LAB;  Service: Cardiovascular;  Laterality: N/A;  LUMBAR LAMINECTOMY/DECOMPRESSION MICRODISCECTOMY N/A 04/20/2020  Procedure: L2/3, L3/4 LAMINECTOMY;  Surgeon: Lucy Chris, MD;  Location: [facility] ORS;  Service: Neurosurgery;  Laterality: N/A;  2ND CASE  PERIPHERAL VASCULAR THROMBECTOMY Right 05/22/2017  Procedure: PERIPHERAL VASCULAR THROMBECTOMY;  Surgeon: Annice Needy, MD;  Location: [facility] INVASIVE CV LAB;  Service: Cardiovascular;  Laterality: Right;  SPINE SURGERY  December 2021  TONSILLECTOMY   Jeannie Done, M.A., CCC-SLP Yetta Barre 12/19/2022, 10:28 AM  Recent Labs    12/19/22 0556  WBC 9.6  HGB 14.3  HCT 42.8  PLT 304   Recent Labs    12/19/22 0556 12/20/22 0604  NA 138 137  K 3.5 3.9  CL 106 103  CO2 23 24  GLUCOSE 100* 98  BUN 21 26*  CREATININE 1.40* 1.40*  CALCIUM 8.5* 8.7*    Intake/Output Summary (Last 24 hours) at 12/21/2022 1914 Last data filed at 12/20/2022 1910 Gross per 24 hour  Intake 587 ml  Output --  Net 587 ml        Physical Exam: Vital Signs Blood pressure (!) 105/56, pulse 74, temperature 98.8 F (37.1 C), temperature source Oral, resp. rate 16, height 5\' 7"  (1.702 m), SpO2 97%.  Prior exams: General: No acute distress Mood and affect are appropriate Heart: Regular rate and rhythm no rubs murmurs or extra sounds Lungs: Clear to auscultation, breathing unlabored, no rales or wheezes Abdomen: Positive bowel sounds, soft nontender to palpation, nondistended Extremities: No clubbing, cyanosis, or edema Skin: No evidence of breakdown, no evidence of rash Neurologic: Cranial nerves II through XII intact, motor strength is 5/5 in right 4/5 left deltoid, bicep, tricep, grip, hip flexor, knee extensors, ankle dorsiflexor and plantar flexor  Left  homonymous hemianopsia Orient to person and hospital but in "[place]" oriented to day of week but not month Musculoskeletal: Full  range of motion in all 4 extremities. No joint swelling    Assessment/Plan: 1. Functional deficits which require 3+ hours per day of interdisciplinary therapy in a comprehensive inpatient rehab setting. Physiatrist is providing close team supervision and 24 hour management of active medical problems listed below. Physiatrist and rehab team continue to assess barriers to discharge/monitor patient progress toward functional and medical goals  Care Tool:  Bathing    Body parts bathed by patient: Right arm, Left arm, Abdomen, Chest, Buttocks, Front perineal area, Left upper leg, Right upper leg, Right lower leg, Left lower leg, Face         Bathing assist Assist Level: Minimal Assistance - Patient > 75%     Upper Body Dressing/Undressing Upper body dressing   What is the patient wearing?: Pull over shirt    Upper body assist Assist Level: Minimal Assistance - Patient > 75%    Lower Body Dressing/Undressing Lower body dressing      What is the patient wearing?: Underwear/pull up, Pants     Lower body assist Assist for lower body dressing: Minimal Assistance - Patient > 75%     Toileting Toileting    Toileting assist Assist for toileting: Minimal Assistance - Patient > 75%     Transfers Chair/bed transfer  Transfers assist     Chair/bed transfer assist level: Contact Guard/Touching assist Chair/bed transfer assistive device: Armrests, Geologist, engineering   Ambulation assist      Assist level: Minimal Assistance - Patient > 75% Assistive device: No Device Max distance: 179ft   Walk 10 feet activity   Assist     Assist level: Contact Guard/Touching assist Assistive device: Walker-rolling   Walk 50 feet activity   Assist    Assist level: Contact Guard/Touching assist Assistive device: Walker-rolling     Walk 150 feet activity   Assist    Assist level: Contact Guard/Touching assist Assistive device: Walker-rolling    Walk 10 feet on uneven surface  activity   Assist Walk 10 feet on uneven surfaces activity did not occur: Safety/medical concerns         Wheelchair     Assist Is the patient using a wheelchair?: Yes Type of Wheelchair: Manual    Wheelchair assist level: Dependent - Patient 0% Max wheelchair distance: 300 ft    Wheelchair 50 feet with 2 turns activity    Assist        Assist Level: Dependent - Patient 0%   Wheelchair 150 feet activity     Assist      Assist Level: Dependent - Patient 0%   Blood pressure (!) 105/56, pulse 74, temperature 98.8 F (37.1 C), temperature source Oral, resp. rate 16, height 5\' 7"  (1.702 m), SpO2 97%.  Medical Problem List and Plan: 1. Functional deficits secondary to R PCA thrombotic CVA             -patient may shower             -ELOS/Goals: 10-12 days, SPV PT/OT/SLP Team conference today please see physician documentation under team conference tab, met with team  to discuss problems,progress, and goals. Formulized individual treatment plan based on medical history, underlying problem and comorbidities.  2.  Antithrombotics: -DVT/anticoagulation:  Pharmaceutical: Lovenox             -antiplatelet therapy: Plavix  3. Pain Management:  Tylenol prn.   - 0 pain reported on nursing assessments; monitor  4. Mood/Behavior/Sleep/at bedtime aggitation:  LCSW to follow  for evaluation and support.              -antipsychotic agents: N/A             -Seroquel for insomnia   - 8/19: Increase at bedtime seroquel to 75 mg, add PRN trazodone 50 mg for sleep   5. Neuropsych/cognition: This patient is not capable of making decisions on his own behalf.              - Delirium precautions, telesitter and bed alarm, utilize aides for orientation (hearing aides, glasses)  6. Skin/Wound Care: Routine pressure  relief measures.  7. Fluids/Electrolytes/Nutrition: Monitor I/O. Check CMET in am. Baseline OP Cr appears 1.1-1.2.   - 8/19: BUN down, Cr up slightly; Encourage Po fluids, monitor I/Os, recheck in AM     Latest Ref Rng & Units 12/20/2022    6:04 AM 12/19/2022    5:56 AM 12/16/2022    6:15 AM  BMP  Glucose 70 - 99 mg/dL 98  621  308   BUN 8 - 23 mg/dL 26  21  25    Creatinine 0.61 - 1.24 mg/dL 6.57  8.46  9.62   Sodium 135 - 145 mmol/L 137  138  137   Potassium 3.5 - 5.1 mmol/L 3.9  3.5  3.9   Chloride 98 - 111 mmol/L 103  106  106   CO2 22 - 32 mmol/L 24  23  22    Calcium 8.9 - 10.3 mg/dL 8.7  8.5  8.6     8. HTN: Monitor BP TID--not on any meds PTA.  - well controlled, monitor Vitals:   12/20/22 1944 12/21/22 0651  BP: 120/74 (!) 105/56  Pulse: 80 74  Resp: 16 16  Temp: 98.4 F (36.9 C) 98.8 F (37.1 C)  SpO2: 99% 97%    9. H/o GIB X 2: Will resume Protonix and monitor stool for occult blood.             --monitor H/H   - HgB stable, LBM 8/18 large nonbloody; monitor     Latest Ref Rng & Units 12/19/2022    5:56 AM 12/16/2022    6:15 AM 12/12/2022    7:49 AM  CBC  WBC 4.0 - 10.5 K/uL 9.6  8.3  7.0   Hemoglobin 13.0 - 17.0 g/dL 95.2  84.1  32.4   Hematocrit 39.0 - 52.0 % 42.8  44.0  44.9   Platelets 150 - 400 K/uL 304  281  299     10. Anxiety d/o: Continue Buspar bid    11. Cardiac monitoring: Has Zio patch/event monitor. Wife with multiple questions MW:NUUV,OZD, care.              Dr Juliann Pares is cardiologist    LOS: 6 days A FACE TO FACE EVALUATION [name] PERFORMED  Erick Colace 12/21/2022, 9:18 AM

## 2022-12-21 NOTE — Progress Notes (Signed)
Patient ID: Clayton [name], male   DOB: 03/10/1935, 88 y.o.   MRN: [id number]  This SW covering for primary SW, Lavera Guise.   SW met with pt and pt wife in room to provide updates from team conference, and d/c date 8/29. SW informed on therapies HHPT/OT/SLP. Wife will follow-up with SW about preferred HHA. Fam edu scheduled for Tuesday (8/27) 1pm-4pm. SW will order shower with [employer].   Cecile Sheerer, MSW, LCSWA Office: [phone] Cell: [phone] Fax: [phone]

## 2022-12-22 NOTE — Progress Notes (Signed)
PROGRESS NOTE   Subjective/Complaints:  Pt sleeping but awakens to voice , discussed d/c date,  Pt denies pains or problems last noc   ROS- no CP, SOB, N/V/D   Objective:   No results found. No results for input(s): "WBC", "HGB", "HCT", "PLT" in the last 72 hours.  Recent Labs    12/20/22 0604  NA 137  K 3.9  CL 103  CO2 24  GLUCOSE 98  BUN 26*  CREATININE 1.40*  CALCIUM 8.7*    Intake/Output Summary (Last 24 hours) at 12/22/2022 0735 Last data filed at 12/21/2022 1749 Gross per 24 hour  Intake 714 ml  Output --  Net 714 ml        Physical Exam: Vital Signs Blood pressure 122/65, pulse 67, temperature 98.7 F (37.1 C), temperature source Oral, resp. rate 16, height 5\' 7"  (1.702 m), weight 67.4 kg, SpO2 100%.  Prior exams: General: No acute distress Mood and affect are appropriate Heart: Regular rate and rhythm no rubs murmurs or extra sounds Lungs: Clear to auscultation, breathing unlabored, no rales or wheezes Abdomen: Positive bowel sounds, soft nontender to palpation, nondistended Extremities: No clubbing, cyanosis, or edema Skin: No evidence of breakdown, no evidence of rash Neurologic: Cranial nerves II through XII intact, motor strength is 5/5 in right 4/5 left deltoid, bicep, tricep, grip, hip flexor, knee extensors, ankle dorsiflexor and plantar flexor  Left homonymous hemianopsia but turns head to attend to Left side  Orient to person and hospital but in "[place]" Musculoskeletal: Full range of motion in all 4 extremities. No joint swelling    Assessment/Plan: 1. Functional deficits which require 3+ hours per day of interdisciplinary therapy in a comprehensive inpatient rehab setting. Physiatrist is providing close team supervision and 24 hour management of active medical problems listed below. Physiatrist and rehab team continue to assess barriers to discharge/monitor patient progress toward  functional and medical goals  Care Tool:  Bathing    Body parts bathed by patient: Right arm, Left arm, Abdomen, Chest, Buttocks, Front perineal area, Left upper leg, Right upper leg, Right lower leg, Left lower leg, Face         Bathing assist Assist Level: Minimal Assistance - Patient > 75%     Upper Body Dressing/Undressing Upper body dressing   What is the patient wearing?: Pull over shirt    Upper body assist Assist Level: Minimal Assistance - Patient > 75%    Lower Body Dressing/Undressing Lower body dressing      What is the patient wearing?: Underwear/pull up, Pants     Lower body assist Assist for lower body dressing: Minimal Assistance - Patient > 75%     Toileting Toileting    Toileting assist Assist for toileting: Minimal Assistance - Patient > 75%     Transfers Chair/bed transfer  Transfers assist     Chair/bed transfer assist level: Contact Guard/Touching assist Chair/bed transfer assistive device: Armrests, Geologist, engineering   Ambulation assist      Assist level: Minimal Assistance - Patient > 75% Assistive device: No Device Max distance: 110ft   Walk 10 feet activity   Assist     Assist level:  Contact Guard/Touching assist Assistive device: Walker-rolling   Walk 50 feet activity   Assist    Assist level: Contact Guard/Touching assist Assistive device: Walker-rolling    Walk 150 feet activity   Assist    Assist level: Contact Guard/Touching assist Assistive device: Walker-rolling    Walk 10 feet on uneven surface  activity   Assist Walk 10 feet on uneven surfaces activity did not occur: Safety/medical concerns         Wheelchair     Assist Is the patient using a wheelchair?: Yes Type of Wheelchair: Manual    Wheelchair assist level: Dependent - Patient 0% Max wheelchair distance: 300 ft    Wheelchair 50 feet with 2 turns activity    Assist        Assist Level: Dependent -  Patient 0%   Wheelchair 150 feet activity     Assist      Assist Level: Dependent - Patient 0%   Blood pressure 122/65, pulse 67, temperature 98.7 F (37.1 C), temperature source Oral, resp. rate 16, height 5\' 7"  (1.702 m), weight 67.4 kg, SpO2 100%.  Medical Problem List and Plan: 1. Functional deficits secondary to R PCA thrombotic CVA, cognitive deficits, poor balance and Left homonymous hemianopsia              -patient may shower             -ELOS/Goals: 8/29, SPV PT/OT/SLP- HH therapy post discharge    2.  Antithrombotics: -DVT/anticoagulation:  Pharmaceutical: Lovenox             -antiplatelet therapy: Plavix  3. Pain Management:  Tylenol prn.   - 0 pain reported on nursing assessments; monitor  4. Mood/Behavior/Sleep/at bedtime aggitation:  LCSW to follow for evaluation and support.              -antipsychotic agents: N/A             -Zyprexa for agitation , has mostly had confusion rather than agitation    5. Neuropsych/cognition: This patient is not capable of making decisions on his own behalf.              - Delirium precautions, telesitter and bed alarm, utilize aides for orientation (hearing aides, glasses)  6. Skin/Wound Care: Routine pressure relief measures.  7. Fluids/Electrolytes/Nutrition: Monitor I/O. Check CMET in am. Baseline OP Cr appears 1.1-1.2.   - 8/19: BUN down, Cr up slightly; Encourage Po fluids, monitor I/Os, recheck in AM Intake 700-841mL per day for last 2 days     Latest Ref Rng & Units 12/20/2022    6:04 AM 12/19/2022    5:56 AM 12/16/2022    6:15 AM  BMP  Glucose 70 - 99 mg/dL 98  829  562   BUN 8 - 23 mg/dL 26  21  25    Creatinine 0.61 - 1.24 mg/dL 1.30  8.65  7.84   Sodium 135 - 145 mmol/L 137  138  137   Potassium 3.5 - 5.1 mmol/L 3.9  3.5  3.9   Chloride 98 - 111 mmol/L 103  106  106   CO2 22 - 32 mmol/L 24  23  22    Calcium 8.9 - 10.3 mg/dL 8.7  8.5  8.6     8. HTN: Monitor BP TID--not on any meds PTA.  - well controlled,  monitor Vitals:   12/21/22 2010 12/22/22 0659  BP: 112/60 122/65  Pulse: 87 67  Resp: 16 16  Temp: 97.9  F (36.6 C) 98.7 F (37.1 C)  SpO2: 96% 100%    9. H/o GIB X 2: Will resume Protonix and monitor stool for occult blood.             --monitor H/H   - HgB stable, LBM 8/18 large nonbloody; monitor     Latest Ref Rng & Units 12/19/2022    5:56 AM 12/16/2022    6:15 AM 12/12/2022    7:49 AM  CBC  WBC 4.0 - 10.5 K/uL 9.6  8.3  7.0   Hemoglobin 13.0 - 17.0 g/dL 40.9  81.1  91.4   Hematocrit 39.0 - 52.0 % 42.8  44.0  44.9   Platelets 150 - 400 K/uL 304  281  299     10. Anxiety d/o: Continue Buspar bid    11. Cardiac monitoring: Has Zio patch/event monitor. Wife with multiple questions NW:GNFA,OZH, care.              Dr Juliann Pares is cardiologist    LOS: 7 days A FACE TO FACE EVALUATION WAS PERFORMED  Erick Colace 12/22/2022, 7:35 AM

## 2022-12-22 NOTE — Progress Notes (Signed)
Occupational Therapy Session Note  Patient Details  Name: Clayton [name] MRN: [id number] Date of Birth: 1934-12-07  Today's Date: 12/22/2022 OT Individual Time: 1016-1059 OT Individual Time Calculation (min): 43 min    Short Term Goals: Week 1:  OT Short Term Goal 1 (Week 1): LTG=STG 2/2 ELOS  Skilled Therapeutic Interventions/Progress Updates:     Pt received sitting up in bed dressed and ready for the day upon OT arrival with wife present in room. Pt presenting with flat affect, however to be in good spirits receptive to skilled OT session reporting 0/10 pain- OT offering intermittent rest breaks, repositioning, and therapeutic support to optimize participation in therapy session. Focus this session family education with emphasis on shower transfers and functional mobility. Pt transitioned to EOB with supervision with HOB mildly elevated. Pt able to don clean socks and shoes with supervision. Pt completed functional mobility >150 ft to ADL apartment using RW with CGA and mod verbal cues for L foot positioning, RW management, and attention to L side. Pt's wife present and participating throughout session for hands-on education. Set-up environment to simulate home bathroom set-up. Provided education on walk-in shower transfer with demonstration provided on how to enter shower backwards using RW. Set-up shower seat without back in "shower" as Pt will be using shower seat at d/c. Pt completed transfer initially with therapist assist with mod verbal cues provided for technique and light min A for balance. Pt and Pt's wife verbalizing understanding of technique and safety considerations. Pt then requesting to use BR. Pt ambulated into bathroom and stood with CGA for continent void using RW. Pt stood at sink to wash hands CGA and was able to locate soap/paper towels with min questioning cues. Following BR visit, provided opportunity for Pt to complete transfer with his wife's assist as she will be the  primary caregiver for him. Pt reporting he did not recall how to complete the transfer despite having practiced transfer <10 minutes prior. Re-educated Pt and wife on transfer technique with demonstration provided. Pt then able to safely complete walk-in shower transfer with wife's assist min A for balance and RW management. Education provided on fall prevention, bathroom safety, and [facility] use with both pt and wife verbalizing understanding. Pt completed functional mobility back to his room with wife's assist with simple education provided on gait belt usage, fall prevention, and simple cues to provide Pt with Pt's wife demonstrating teach back as evidence of learning. Pt was left resting in wc with call bell in reach, seat belt alarm on, telesitter on, and all needs met.    Therapy Documentation Precautions:  Precautions Precautions: Fall Precaution Comments: mild L inattention to surroundings Restrictions Weight Bearing Restrictions: No  Therapy/Group: Individual Therapy  Clide Deutscher 12/22/2022, 8:00 AM

## 2022-12-22 NOTE — Progress Notes (Signed)
Speech Language Pathology Daily Session Note  Patient Details  Name: Clayton [name] MRN: [id number] Date of Birth: June 30, 1934  Today's Date: 12/22/2022 SLP Individual Time: 0800-0900 SLP Individual Time Calculation (min): 60 min  Short Term Goals: Week 1: SLP Short Term Goal 1 (Week 1): STGs=LTGs 2* ELOS  Skilled Therapeutic Interventions: Skilled therapy session focused on dysphagia, speech intelligibility and cognitive goals. SLP observed patient consumption of breakfast tray with occasional throat clears before voicing, however no other overt s/sx of aspiration. SLP provided supervision A during PO trials for swallowing strategies including small bites/sips and no straw. Recommend continuation of current diet and supervision level. SLP addressed L awareness throughout the session by providing mod verbal/visual cues to attend to L during breakfast, orientation tasks, and review of daily schedule. Patient with continued deficits in orientation requiring modA to utilize calendar and correctly recall situation and city. Patient able to identify he is in a hospital, however continues to state he is in [place], [place] (hometown). SLP created a poster for room to orient patient to location. SLP addressed speech intelligibility goals by providing mod verbal A to aid patient in completion of respiratory strength activities. Patient unable to recall activities from the prior day, however able to complete once instructions were repeated x1. Patient left in bed with call bell in hand and alarm set. Wife present. Continue POC.    Pain No Pain Reported  Therapy/Group: Individual Therapy  Grettel Rames M.A., CF-SLP 12/22/2022, 9:47 AM

## 2022-12-22 NOTE — Progress Notes (Signed)
Physical Therapy Weekly Progress Note  Patient Details  Name: Clayton [name] MRN: [id number] Date of Birth: [birth year]  Beginning of progress report period: December 16, 2022 End of progress report period: December 22, 2022  Today's Date: 12/22/2022 PT Individual Time: 1125-1208 and 1700-1749 PT Individual Time Calculation (min): 43 min and 49 min  Patient has met 0 of 1 short term goals due to none being set at initial eval based on ELOS at that time. Clayton [name] is progressing well with therapy demonstrating increasing independence with functional mobility; however, continues to have greatest impairments of L inattention in combination with likely field cut, impaired balance due to impaired sensation in B LEs, and impaired strength and endurance. He is currently performing supine<>sit with supervision, sit<>stands and stand pivots no AD with CGA/intermittent light min A, gait training >265ft no AD with min A, and stair navigation using L HR with min A. Therapist is recommending pt use RW at home for balance stability due to above deficits. Pt will benefit from continued CIR level skilled physical therapy to further increase his independence with mobility and decrease his fall risk prior to D/Cing home with 24hr support from his wife who has formal eduction scheduled on Tuesday 8/27.   Patient continues to demonstrate the following deficits muscle weakness, decreased cardiorespiratoy endurance, impaired timing and sequencing and unbalanced muscle activation, decreased visual perceptual skills and field cut, decreased attention to left and decreased motor planning, decreased attention, decreased awareness, decreased problem solving, decreased safety awareness, decreased memory, and delayed processing, and decreased standing balance, decreased postural control, and decreased balance strategies and therefore will continue to benefit from skilled PT intervention to increase functional independence with  mobility.  Patient progressing toward long term goals..  Continue plan of care.  PT Short Term Goals Week 1:  PT Short Term Goal 1 (Week 1): STG = LTG d/t ELOS Week 2:  PT Short Term Goal 1 (Week 2): = to LTGs based on ELOS  Skilled Therapeutic Interventions/Progress Updates:  Ambulation/gait training;Community reintegration;DME/adaptive equipment instruction;Neuromuscular re-education;Psychosocial support;Stair training;UE/LE Strength taining/ROM;UE/LE Coordination activities;Therapeutic Activities;Skin care/wound management;Pain management;Discharge planning;Balance/vestibular training;Cognitive remediation/compensation;Disease management/prevention;Functional mobility training;Patient/family education;Visual/perceptual remediation/compensation;Therapeutic Exercise   Session 1: Pt received sitting in w/c with his wife, [name], present and pt agreeable to therapy session. Pt's wife present throughout session for observation. Sit<>stands, no AD, with CGA for steadying throughout session. Therapist reinforcing education on recommendation for pt to use RW at home for balance safety despite performing gait training today without AD. Therapist also reinforcing prior education regarding L inattention and implications on mobility.  Gait training ~135ft to main therapy gym, no AD, with CGA/light min A for balance - continues to achieve reciprocal stepping pattern and adequate gait speed - has difficulty controlling forward momentum for sudden start/stops and continues to have L inattention with impaired visual scanning of L field requiring total cuing to lcoate items on that side (such as the door pad to open the doors).  Dynamic gait training using agility ladder including the following:  - forward reciprocal pattern x2 reps with min A for balance - side stepping x3 reps each direction - has strong anterior, L lean bias throughout requiring frequent min A for balance - demos improving ability to gauge  step lengths towards L with repetition and ability for short term carry over of this improvement on 3rd trial - step-to backwards pattern down/back x2 with min A for balance leading with each LE, having more difficulty leading with L -  progressed to reciprocal stepping pattern x1 rep  with pt requiring continuous heavy min A to maintain balance during reciprocal pattern  Gait training back to his room as described above with continued focus on L visual scanning to turn L, locate door pad, and locate his room. At end of session, pt left seated in w/c with needs in reach, seat belt alarm on, and his wife present.   Session 2: Pt received supine in bed resting and with min encouragement is agreeable to therapy session. Supine>sitting L EOB, HOB flat and not using bedrails, with supervision. Sitting EOB, donned shoes that were placed on his L side without assist.   Sit<>stands, starting with L HHA progressed to no UE support, with CGA for steadying and pt intermittently using backs of legs against seat for stability.   Gait training ~323ft using L HHA as pt reporting feeling "unbalanced" with light min assist for steadying - continues to have reciprocal stepping pattern with adequate gait speed - continues to have balance instability with poor management of forward momentum.  Gait training ~42ft x2 to/from stairs, no UE support, with light min/CGA for steadying - starts with excessive anterior trunk lean but able to identify and correct.  Stair navigation training ascending/descending 24 steps (6" height) using B UE support on B HRs progressed to only L UE support on each HR for increased challenge and L attention focus, with light min assist/CGA for steadying - pt able to perform reciprocal pattern in both directions with good control.  Pt reports it "feels good" to walk and navigate stairs.  Pt demonstrating significant improvement in endurance and B LE strength.  Continues to have L inattention,  but improving with pt able to start to recall need to increase attention in that direction.   Dynamic stepping balance challenge of 4 square stepping over sticks including:  - clockwise, counter-clockwise, and then stepping a specific direction on verbal cuing to target the directions most challenging to the pt including: stepping backwards (leading with L more difficult than leading with R) and side stepping towards L - requires up to heavy min A for balance with this task with primarily anterior LOB  Pt continues to have significant balance deficits when tasked with stepping over items.  Dynamic stepping R/L lateral over 6" hurdle with dual-task challenge of picking up clothespins with L hand from low height of a step and then stepping over hurdle to place clothespin on high target of basketball goal netting on his L to challenge his visual scanning - to start he steps on/kicks hurdle every time but progresses to clearing it successfully consistently and to start has repeated posterior>anterior LOB when stepping to place clothespin on basketball goal net - to start requires heavy min A for balance progressed to CGA/light min A.  Gait training back to his room, no UE support, with light min A/CGA - demos increased width in BOS and increased external rotation in B LEs - likely pt seeking stability due to fatigue.   Pt continent of bladder in standing with CGA for balance stability. At end of session, pt left supported upright in bed with bed alarm on and in care of NT for meal.  Therapy Documentation Precautions:  Precautions Precautions: Fall Precaution Comments: mild L inattention to surroundings Restrictions Weight Bearing Restrictions: No   Pain:  Session 1: No reports of pain throughout session.  Session 2: Denies pain during session.   Therapy/Group: Individual Therapy  Yomar Mejorado Verdell Face , PT, DPT,  NCS, CSRS 12/22/2022, 8:21 AM

## 2022-12-22 NOTE — Progress Notes (Addendum)
Patient ID: Clayton [name], male   DOB: Aug 04, 1934, 88 y.o.   MRN: [id number]  This SW covering for primary SW, Lavera Guise.   SW received message from pt wife reporting preferred [facility]HA is [facility]. SW sent HHPT/OT/SLP referral to [facility] and waiting on follow-up.  *Referral accepted.   SW ordered shower chair with [employer] via parachute.  SW updated pt and pt wife on above about HHA.   Cecile Sheerer, MSW, LCSWA Office: [phone] Cell: [phone] Fax: [phone]

## 2022-12-23 NOTE — Progress Notes (Signed)
Physical Therapy Session Note  Patient Details  Name: Clayton [name] MRN: [id number] Date of Birth: Jan 28, 1935  Today's Date: 12/23/2022 PT Individual Time: 1300-1415 PT Individual Time Calculation (min): 75 min   Short Term Goals: Week 2:  PT Short Term Goal 1 (Week 2): = to LTGs based on ELOS  Skilled Therapeutic Interventions/Progress Updates: Pt in BR w/ nursing upon arrival and took over care from RN.  Pt manages pericare w/ CGA as well as managing clothing.  Pt continent of bowel and bladder in toilet, noted in Flowsheets.  Pt amb to sink for hand washing and then to w/c.  Pt amb multiple trials during session w/ min/CGA up to 200+', w/o AD.  Pt requires verbal cues for upright posture and visual scanning, especially left.  Pt performs quick stops/starts w/o LOB.  Pt performs cone obstacle course w/ increased cueing for the first cone to left and then remembers to compensate for inattention.  Pt performs standing cone stacking and unstacking using B UES simultaneously standing on [employer] cushion.  Pt performed toe taps to 5" platform w/ verbal and occasional tactile cues for weight shift.  Pt performing toe taps to cone during obstacle course negotiation when requests need for BR, continent of bladder w/ same A needed.  Pt amb to main gym for stair negotiation w/ 1 rail and CGA, reciprocal gait pattern self-selected.  Pt returned to room and remained sitting in w/c w/ chair alarm on and all needs in reach.     Therapy Documentation Precautions:  Precautions Precautions: Fall Precaution Comments: mild L inattention to surroundings Restrictions Weight Bearing Restrictions: No General:   Vital Signs:   Pain:0/10      Therapy/Group: Individual Therapy  Lucio Edward 12/23/2022, 2:17 PM

## 2022-12-23 NOTE — Progress Notes (Signed)
PROGRESS NOTE   Subjective/Complaints:  Wife feels pt ignores left lower objects more so than left upper   ROS- no CP, SOB, N/V/D   Objective:   No results found. No results for input(s): "WBC", "HGB", "HCT", "PLT" in the last 72 hours.  No results for input(s): "NA", "K", "CL", "CO2", "GLUCOSE", "BUN", "CREATININE", "CALCIUM" in the last 72 hours.   Intake/Output Summary (Last 24 hours) at 12/23/2022 0833 Last data filed at 12/23/2022 0736 Gross per 24 hour  Intake 716 ml  Output 1100 ml  Net -384 ml        Physical Exam: Vital Signs Blood pressure 126/77, pulse 73, temperature 98.1 F (36.7 C), temperature source Oral, resp. rate 16, height 5\' 7"  (1.702 m), weight 67.4 kg, SpO2 97%.  Prior exams: General: No acute distress Mood and affect are appropriate Heart: Regular rate and rhythm no rubs murmurs or extra sounds Lungs: Clear to auscultation, breathing unlabored, no rales or wheezes Abdomen: Positive bowel sounds, soft nontender to palpation, nondistended Extremities: No clubbing, cyanosis, or edema Skin: No evidence of breakdown, no evidence of rash Neurologic: Cranial nerves II through XII intact, motor strength is 5/5 in right 4/5 left deltoid, bicep, tricep, grip, hip flexor, knee extensors, ankle dorsiflexor and plantar flexor  Left homonymous hemianopsia but turns head to attend to Left side  Orient to person and hospital in [place] (improved 8/23) Musculoskeletal: Full range of motion in all 4 extremities. No joint swelling    Assessment/Plan: 1. Functional deficits which require 3+ hours per day of interdisciplinary therapy in a comprehensive inpatient rehab setting. Physiatrist is providing close team supervision and 24 hour management of active medical problems listed below. Physiatrist and rehab team continue to assess barriers to discharge/monitor patient progress toward functional and  medical goals  Care Tool:  Bathing    Body parts bathed by patient: Right arm, Left arm, Abdomen, Chest, Buttocks, Front perineal area, Left upper leg, Right upper leg, Right lower leg, Left lower leg, Face         Bathing assist Assist Level: Minimal Assistance - Patient > 75%     Upper Body Dressing/Undressing Upper body dressing   What is the patient wearing?: Pull over shirt    Upper body assist Assist Level: Minimal Assistance - Patient > 75%    Lower Body Dressing/Undressing Lower body dressing      What is the patient wearing?: Underwear/pull up, Pants     Lower body assist Assist for lower body dressing: Minimal Assistance - Patient > 75%     Toileting Toileting    Toileting assist Assist for toileting: Minimal Assistance - Patient > 75%     Transfers Chair/bed transfer  Transfers assist     Chair/bed transfer assist level: Minimal Assistance - Patient > 75% Chair/bed transfer assistive device:  (none)   Locomotion Ambulation   Ambulation assist      Assist level: Minimal Assistance - Patient > 75% Assistive device: No Device Max distance: 269ft   Walk 10 feet activity   Assist     Assist level: Minimal Assistance - Patient > 75% Assistive device: No Device   Walk 50 feet activity  Assist    Assist level: Minimal Assistance - Patient > 75% Assistive device: No Device    Walk 150 feet activity   Assist    Assist level: Minimal Assistance - Patient > 75% Assistive device: No Device    Walk 10 feet on uneven surface  activity   Assist Walk 10 feet on uneven surfaces activity did not occur: Safety/medical concerns         Wheelchair     Assist Is the patient using a wheelchair?: No Type of Wheelchair: Manual    Wheelchair assist level: Dependent - Patient 0% Max wheelchair distance: 300 ft    Wheelchair 50 feet with 2 turns activity    Assist        Assist Level: Dependent - Patient 0%    Wheelchair 150 feet activity     Assist      Assist Level: Dependent - Patient 0%   Blood pressure 126/77, pulse 73, temperature 98.1 F (36.7 C), temperature source Oral, resp. rate 16, height 5\' 7"  (1.702 m), weight 67.4 kg, SpO2 97%.  Medical Problem List and Plan: 1. Functional deficits secondary to R PCA thrombotic CVA, cognitive deficits, poor balance and Left homonymous hemianopsia - may have greater involvement of left lower quadrant             -patient may shower             -ELOS/Goals: 8/29, SPV PT/OT/SLP- HH therapy post discharge    2.  Antithrombotics: -DVT/anticoagulation:  Pharmaceutical: Lovenox             -antiplatelet therapy: Plavix  3. Pain Management:  Tylenol prn.   - 0 pain reported on nursing assessments; monitor  4. Mood/Behavior/Sleep/at bedtime aggitation:  LCSW to follow for evaluation and support.              -antipsychotic agents: N/A             -Zyprexa for agitation , has mostly had confusion rather than agitation    5. Neuropsych/cognition: This patient is not capable of making decisions on his own behalf.              - Delirium precautions, telesitter and bed alarm, utilize aides for orientation (hearing aides, glasses)  6. Skin/Wound Care: Routine pressure relief measures.  7. Fluids/Electrolytes/Nutrition: Monitor I/O. Check CMET in am. Baseline OP Cr appears 1.1-1.2.   - 8/19: BUN down, Cr up slightly; Encourage Po fluids, monitor I/Os, recheck in AM Intake 700-883mL per day for last 2 days     Latest Ref Rng & Units 12/20/2022    6:04 AM 12/19/2022    5:56 AM 12/16/2022    6:15 AM  BMP  Glucose 70 - 99 mg/dL 98  161  096   BUN 8 - 23 mg/dL 26  21  25    Creatinine 0.61 - 1.24 mg/dL 0.45  4.09  8.11   Sodium 135 - 145 mmol/L 137  138  137   Potassium 3.5 - 5.1 mmol/L 3.9  3.5  3.9   Chloride 98 - 111 mmol/L 103  106  106   CO2 22 - 32 mmol/L 24  23  22    Calcium 8.9 - 10.3 mg/dL 8.7  8.5  8.6     8. HTN: Monitor BP  TID--not on any meds PTA.  - well controlled, monitor Vitals:   12/23/22 0413 12/23/22 0507  BP:  126/77  Pulse:  73  Resp:  16  Temp: 98.5 F (36.9 C) 98.1 F (36.7 C)  SpO2:  97%    9. H/o GIB X 2: Will resume Protonix and monitor stool for occult blood.             --monitor H/H   - HgB stable, LBM 8/18 large nonbloody; monitor     Latest Ref Rng & Units 12/19/2022    5:56 AM 12/16/2022    6:15 AM 12/12/2022    7:49 AM  CBC  WBC 4.0 - 10.5 K/uL 9.6  8.3  7.0   Hemoglobin 13.0 - 17.0 g/dL 65.7  84.6  96.2   Hematocrit 39.0 - 52.0 % 42.8  44.0  44.9   Platelets 150 - 400 K/uL 304  281  299     10. Anxiety d/o: Continue Buspar bid    11. Cardiac monitoring: Has Zio patch/event monitor. Wife with multiple questions XB:MWUX,LKG, care.              Dr Juliann Pares is cardiologist    LOS: 8 days A FACE TO FACE EVALUATION WAS PERFORMED  Erick Colace 12/23/2022, 8:33 AM

## 2022-12-23 NOTE — Progress Notes (Signed)
Speech Language Pathology Weekly Progress and Session Note  Patient Details  Name: Clayton [name] MRN: [id number] Date of Birth: 23-Feb-1935  Beginning of progress report period: December 16, 2022 End of progress report period: December 23, 2022  Today's Date: 12/23/2022 SLP Individual Time: 0900-0959 SLP Individual Time Calculation (min): 59 min  Short Term Goals: Week 1: SLP Short Term Goal 1 (Week 1): STGs=LTGs 2* ELOS  New Short Term Goals: Week 2: SLP Short Term Goal 2 (Week 2): LTG = STG due to ELOS  Weekly Progress Updates: Patient met 0/6 STGs due to STG's not written per patients current ELOS. Patients ELOS extended since evaluation due to poor cognition and need for further PT/OT/SLP services. Currently, patient is a minA for utilization of swallowing strategies and completion of EMST/respiratory strength exercises to increase speech intelligibility. Patient continues to require at least modA to complete cognitive tasks due to poor memory and L neglect vs field cut. Pt/family education ongoing. Pt would benefit from continued ST services to address goals and maximize functional independence prior to d/c.   Intensity: Minumum of 1-2 x/day, 30 to 90 minutes Frequency: 1 to 3 out of 7 days Duration/Length of Stay: 8/29 Treatment/Interventions: Cognitive remediation/compensation;Environmental controls;Cueing hierarchy;Functional tasks;Therapeutic Activities;Internal/external aids;Dysphagia/aspiration precaution training;Patient/family education   Daily Session Skilled Therapeutic Interventions:  Skilled therapy session focused on cognitive goals and respiratory strength exercises. SLP facilitated session by providing external and verbal cues during orientation tasks. This date patient independently able to utilize poster with current location, however continues to require mod cues to scan calendar from L to R to identify date. Patient required re-explanation of all respiratory  exercises completed prior date and completed with minA. To target attention, SLP and patient collaborated on a list of items to locate in hospital. Patient with utilization of L to R scanning strategy with modA during task. At beginning of session, SLP reminded patient of location of unit and personal room number, however patient unable to recall upon return to unit. Patient left in [facility] with alarm set and call bell in reach. Continue POC.    Pain No pain reported during session  Therapy/Group: Individual Therapy  Isobel Eisenhuth M.A., CF-SLP 12/23/2022, 10:58 AM

## 2022-12-23 NOTE — Progress Notes (Signed)
Occupational Therapy Weekly Progress Note  Patient Details  Name: Clayton [name] MRN: [id number] Date of Birth: 01-Jan-1935  Beginning of progress report period: December 16, 2022 End of progress report period: December 23, 2022  Today's Date: 12/23/2022 OT Individual Time: 1105-1200 OT Individual Time Calculation (min): 55 min    Patient has met 0 of 1 short term goals d/t STGs not written based on Pt's ELOS. Pt's LOS extended to allow for increased time for family education to increase safety at d/c and allow for Pt to reach optimal level of independence prior to d/c. Clayton [name] is making consistent progress towards reaching his LTGs. He is supported by his wife who often participates in therapy session, inquires about his care, and is extremely supportive of Pt. Pt is able to complete UB bathing/dressing supervision and LB bathing/dressing CGA. Pt is completing grooming/hygiene tasks supervision to CGA when standing at sink using RW. Pt requires CGA-min A for toileting to ensure cleanliness. Pt is demonstrating improved awareness to his L side with improved use of visual scanning techniques with min-mod verbal cues. Pt is able to complete functional transfers using his RW with CGA for safety and min verbal cues for L foot placement.   Patient continues to demonstrate the following deficits: muscle weakness, decreased cardiorespiratoy endurance, decreased motor planning, decreased visual motor skills and field cut, decreased attention to left, decreased attention, decreased memory, and delayed processing, central origin, and decreased standing balance, decreased postural control, hemiplegia, and decreased balance strategies and therefore will continue to benefit from skilled OT intervention to enhance overall performance with BADL and Reduce care partner burden.  Patient progressing toward long term goals..  Continue plan of care.  OT Short Term Goals Week 1:  OT Short Term Goal 1 (Week 1):  LTG=STG 2/2 ELOS OT Short Term Goal 1 - Progress (Week 1): Progressing toward goal Week 2:  OT Short Term Goal 1 (Week 2): STG=LTG d/t ELOS  Skilled Therapeutic Interventions/Progress Updates:     Pt received siting up in wc presenting to be in good spirits receptive to skilled OT session reporting 0/10 pain- OT offering intermittent rest breaks, repositioning, and therapeutic support to optimize participation in therapy session. Pt receptive to taking shower this session. Pt's wife present in room upon OT arrival and participatory in observing therapy session. Pt completed functional mobility to bathroom using RW with CGA and was able to locate bathroom this session without verbal cues required!! Pt transferred to toilet CGA using grab bars/RW and doffed clothing in sitting with supervision. Pt completed ambulatory transfer into shower with CGA using RW. Pt able to complete UB bathing seated with supervision and LB bathing CGA only when standing to wash his bottom. Pt required mod verbal cues to sequence through washing each body part and to recall need to rinse hair. Pt completed UB dressing sitting EOB with supervision and LB dressing CGA when standing to bring pants to waist with Pt able to management buttons and zippers without difficulty this session. Engaged Pt in completing functional mobility >150 ft to therapy gym using RW with CGA and min verbal cues provided for foot placement and pacing. Pt completed visual scanning bell cancellation task while standing at BITs using RW for balance with close supervision. Education provided on light house visual scanning technique with pt able to utilize with mod verbal cues. Pt required increased time to complete task 5:13 minutes with 3 misses present (2 on L side) and 0 distractors hit. Pt completed  functional mobility back to his room using RW while engaged in hospital navigation task with Pt instructed to use hospital signs to navigate back to his room. Pt  required max verbal cues to recall location of room and mod verbal cues for problem solving. Pt was left resting in wc with call bell in reach, seat belt alarm on, telesitter on, wife present in room, and all needs met.    Therapy Documentation Precautions:  Precautions Precautions: Fall Precaution Comments: mild L inattention to surroundings Restrictions Weight Bearing Restrictions: No   Therapy/Group: Individual Therapy  Clide Deutscher 12/23/2022, 7:57 AM

## 2022-12-24 DIAGNOSIS — F05 Delirium due to known physiological condition: Secondary | ICD-10-CM

## 2022-12-24 DIAGNOSIS — R7989 Other specified abnormal findings of blood chemistry: Secondary | ICD-10-CM

## 2022-12-24 NOTE — Plan of Care (Signed)
  Problem: RH SAFETY Goal: RH STG ADHERE TO SAFETY PRECAUTIONS W/ASSISTANCE/DEVICE Description: STG Adhere to Safety Precautions With cues Assistance/Device. Outcome: Progressing   Problem: RH KNOWLEDGE DEFICIT Goal: RH STG INCREASE KNOWLEDGE OF HYPERTENSION Description: Patient and wife will be able to manage HTN with medications and dietary modification using educational resources independently Outcome: Progressing Goal: RH STG INCREASE KNOWLEGDE OF HYPERLIPIDEMIA Description: Patient and wife will be able to manage HLD with medications and dietary modification using educational resources independently Outcome: Progressing

## 2022-12-24 NOTE — Progress Notes (Signed)
Speech Language Pathology Daily Session Note  Patient Details  Name: Clayton [name] MRN: [id number] Date of Birth: 02-17-1935  Today's Date: 12/24/2022 SLP Individual Time: 0205-0250 SLP Individual Time Calculation (min): 45 min  Short Term Goals: Week 2: SLP Short Term Goal 2 (Week 2): LTG = STG due to ELOS  Skilled Therapeutic Interventions:  Pt seen for skilled ST targeting speech and cognition goals. Pt's wife present for session. Pt denied pain. SLP facilitated using of respiratory muscle strength training (EMST) to target respiratory support for speech. Pt stated he did not recall how to use it so SLP re-oriented him to it. Pt stated it was 1/10 difficulty at initial resistance level so SLP adjusted it to 15 and pt stated it fell within goal of 6-7/10 difficulty. Pt completed 4 sets of 10 exercises with breaks between sets. Denied SOB or dizziness. SLP also facilitated visual scanning activity during medication management task to target identifying errors and L neglect. At baseline, wife organizes and manages pt's meds due to pt's dyslexia but discussed importance of pt being able to check for errors as well. Pt able to see full page without use of scanning strategy but stated that peripheral vision on L side was impaired. SLP read prescription instruction aloud and pt identified errors with 60% acc when provided mod verbal and visual cues.   Pt left in chair with alarm activated, call bell in reach, and wife present. Continue ST POC.  Pain Pain Assessment Pain Scale: 0-10 Pain Score: 0-No pain  Therapy/Group: Individual Therapy  Alphonsus Sias 12/24/2022, 5:04 PM

## 2022-12-24 NOTE — Progress Notes (Signed)
Physical Therapy Session Note  Patient Details  Name: Clayton [name] MRN: [id number] Date of Birth: [birth year]  Today's Date: 12/24/2022 PT Individual Time: 0910-1006 PT Individual Time Calculation (min): 56 min   Short Term Goals: Week 2:  PT Short Term Goal 1 (Week 2): = to LTGs based on ELOS  Skilled Therapeutic Interventions/Progress Updates:    Pt received supported upright in bed with his wife, Clayton [name], present and pt agreeable to therapy session. Pt/wife report today is their anniversary. Pt's wife present throughout session. Supine>sitting L EOB, HOB partially elevated, with supervision progressing to mod-I. Sitting EOB threaded on pants and donned shoes set-up assist. Sit<>stands, no AD, throughout session with CGA for steadying and pt having to stand and stabilize for a minute prior to initiating forward mobility due to instability - continuing to challenge him without AD but pt may still require use of RW at D/C. Notified nursing staff of plan to go outside during session.   Gait training ~454ft to/from outside, no AD, including on/off elevator with CGA for safety. Continued to challenge pt throughout session with focus on visually scanning to locate items in environment including elevator button, walkway, stair handrail, doorways, etc. With increased time pt and min/mod verbal cuing to recall need for L scanning he is able to find all items.   Gait training ~591ft x2 outside on unlevel paved surfaces with CGA and a few instances of light min A but no overt LOB or instability occurs. Pt does have decreased L LE foot clearance compared to R causing slight shuffle gait pattern - cuing and education on heel strike at initial contact to improve. Outside stair navigation up/down 8 steps using only R then L UE support on HR with CGA and pt self-selecting reciprocal stepping pattern - no significant instability noted.  Educated pt's wife on therapist providing verbal cuing in anticipation  of potential safety risk or need for L attention throughout mobility in order to allow pt time to hear, process, and motor plan execution of the recommendation. Example: when approaching stairs reminding pt to use the HR support  Educated on pt's cognitive deficits impacting his safety awareness and overall awareness/memory of his CLOF and potential limitations and balance deficits. Pt continues to demo poor short term recall of information during a session and decreased recall of information day-to-day.   At end of session, pt left seated EOB in the care of his wife with needs in reach and bed alarm on.    Therapy Documentation Precautions:  Precautions Precautions: Fall Precaution Comments: mild L inattention to surroundings Restrictions Weight Bearing Restrictions: No   Pain:  No reports of pain throughout session.    Therapy/Group: Individual Therapy  Ginny Forth , PT, DPT, NCS, CSRS 12/24/2022, 7:47 AM

## 2022-12-24 NOTE — Progress Notes (Signed)
PROGRESS NOTE   Subjective/Complaints:  Pt resting when I came in. No new issues this morning. Denies pain.   ROS: Patient denies fever, rash, sore throat, blurred vision, dizziness, nausea, vomiting, diarrhea, cough, shortness of breath or chest pain, joint or back/neck pain, headache, or mood change.    Objective:   No results found. No results for input(s): "WBC", "HGB", "HCT", "PLT" in the last 72 hours.  No results for input(s): "NA", "K", "CL", "CO2", "GLUCOSE", "BUN", "CREATININE", "CALCIUM" in the last 72 hours.   Intake/Output Summary (Last 24 hours) at 12/24/2022 1058 Last data filed at 12/24/2022 0858 Gross per 24 hour  Intake 240 ml  Output 300 ml  Net -60 ml        Physical Exam: Vital Signs Blood pressure 134/69, pulse 82, temperature 97.7 F (36.5 C), temperature source Oral, resp. rate 17, height 5\' 7"  (1.702 m), weight 67.4 kg, SpO2 97%.  Constitutional: No distress . Vital signs reviewed. HEENT: NCAT, EOMI, oral membranes moist Neck: supple Cardiovascular: RRR without murmur. No JVD    Respiratory/Chest: CTA Bilaterally without wheezes or rales. Normal effort    GI/Abdomen: BS +, non-tender, non-distended Ext: no clubbing, cyanosis, or edema Psych: pleasant and cooperative  Skin: No evidence of breakdown, no evidence of rash Neurologic: Cranial nerves II through XII intact, motor strength is 5/5 in right 4/5 left deltoid, bicep, tricep, grip, hip flexor, knee extensors, ankle dorsiflexor and plantar flexor  Left homonymous hemianopsia but turns head to attend to Left side--persistent  Orient to person and hospital, reason he's here Musculoskeletal: Full range of motion in all 4 extremities. No joint swelling    Assessment/Plan: 1. Functional deficits which require 3+ hours per day of interdisciplinary therapy in a comprehensive inpatient rehab setting. Physiatrist is providing close team  supervision and 24 hour management of active medical problems listed below. Physiatrist and rehab team continue to assess barriers to discharge/monitor patient progress toward functional and medical goals  Care Tool:  Bathing    Body parts bathed by patient: Right arm, Left arm, Abdomen, Chest, Buttocks, Front perineal area, Left upper leg, Right upper leg, Right lower leg, Left lower leg, Face         Bathing assist Assist Level: Minimal Assistance - Patient > 75%     Upper Body Dressing/Undressing Upper body dressing   What is the patient wearing?: Pull over shirt    Upper body assist Assist Level: Minimal Assistance - Patient > 75%    Lower Body Dressing/Undressing Lower body dressing      What is the patient wearing?: Underwear/pull up, Pants     Lower body assist Assist for lower body dressing: Minimal Assistance - Patient > 75%     Toileting Toileting    Toileting assist Assist for toileting: Contact Guard/Touching assist     Transfers Chair/bed transfer  Transfers assist     Chair/bed transfer assist level: Minimal Assistance - Patient > 75% Chair/bed transfer assistive device:  (none)   Locomotion Ambulation   Ambulation assist      Assist level: Minimal Assistance - Patient > 75% Assistive device: No Device Max distance: 200+   Walk 10 feet  activity   Assist     Assist level: Minimal Assistance - Patient > 75% Assistive device: No Device   Walk 50 feet activity   Assist    Assist level: Minimal Assistance - Patient > 75% Assistive device: No Device    Walk 150 feet activity   Assist    Assist level: Minimal Assistance - Patient > 75% Assistive device: No Device    Walk 10 feet on uneven surface  activity   Assist Walk 10 feet on uneven surfaces activity did not occur: Safety/medical concerns         Wheelchair     Assist Is the patient using a wheelchair?: No Type of Wheelchair: Manual    Wheelchair  assist level: Dependent - Patient 0% Max wheelchair distance: 300 ft    Wheelchair 50 feet with 2 turns activity    Assist        Assist Level: Dependent - Patient 0%   Wheelchair 150 feet activity     Assist      Assist Level: Dependent - Patient 0%   Blood pressure 134/69, pulse 82, temperature 97.7 F (36.5 C), temperature source Oral, resp. rate 17, height 5\' 7"  (1.702 m), weight 67.4 kg, SpO2 97%.  Medical Problem List and Plan: 1. Functional deficits secondary to R PCA thrombotic CVA, cognitive deficits, poor balance and Left homonymous hemianopsia - may have greater involvement of left lower quadrant             -patient may shower             -ELOS/Goals: 8/29, SPV PT/OT/SLP- HH therapy post discharge   -Continue CIR therapies including PT, OT, and SLP   2.  Antithrombotics: -DVT/anticoagulation:  Pharmaceutical: Lovenox             -antiplatelet therapy: Plavix  3. Pain Management:  Tylenol prn.   - 0 pain reported on nursing assessments; monitor  4. Mood/Behavior/Sleep/at bedtime aggitation:  LCSW to follow for evaluation and support.              -antipsychotic agents: N/A             -Zyprexa for agitation , has mostly had confusion rather than agitation    5. Neuropsych/cognition: This patient is not capable of making decisions on his own behalf.              - Delirium precautions, telesitter and bed alarm, utilize aides for orientation (hearing aides, glasses)  6. Skin/Wound Care: Routine pressure relief measures.  7. Fluids/Electrolytes/Nutrition: Monitor I/O. Check CMET in am. Baseline OP Cr appears 1.1-1.2.   - 8/19: BUN down, Cr up slightly; Encourage Po fluids, monitor I/Os, recheck in AM Intake 700-853mL per day for last 2 days     Latest Ref Rng & Units 12/20/2022    6:04 AM 12/19/2022    5:56 AM 12/16/2022    6:15 AM  BMP  Glucose 70 - 99 mg/dL 98  409  811   BUN 8 - 23 mg/dL 26  21  25    Creatinine 0.61 - 1.24 mg/dL 9.14  7.82  9.56    Sodium 135 - 145 mmol/L 137  138  137   Potassium 3.5 - 5.1 mmol/L 3.9  3.5  3.9   Chloride 98 - 111 mmol/L 103  106  106   CO2 22 - 32 mmol/L 24  23  22    Calcium 8.9 - 10.3 mg/dL 8.7  8.5  8.6  8. HTN: Monitor BP TID--not on any meds PTA.  - 8/24 controlled Vitals:   12/23/22 1941 12/24/22 0602  BP: 128/68 134/69  Pulse: 78 82  Resp: 17 17  Temp: 98.1 F (36.7 C) 97.7 F (36.5 C)  SpO2: 98% 97%    9. H/o GIB X 2: Will resume Protonix and monitor stool for occult blood.             --monitor H/H   - HgB stable, LBM 8/18 large nonbloody; monitor     Latest Ref Rng & Units 12/19/2022    5:56 AM 12/16/2022    6:15 AM 12/12/2022    7:49 AM  CBC  WBC 4.0 - 10.5 K/uL 9.6  8.3  7.0   Hemoglobin 13.0 - 17.0 g/dL 56.3  87.5  64.3   Hematocrit 39.0 - 52.0 % 42.8  44.0  44.9   Platelets 150 - 400 K/uL 304  281  299     10. Anxiety d/o: Continue Buspar bid--mood calm at present    11. Cardiac monitoring: Has Zio patch/event monitor. Wife with multiple questions PI:RJJO,ACZ, care.              Dr Juliann Pares is cardiologist    LOS: 9 days A FACE TO FACE EVALUATION WAS PERFORMED  Ranelle Oyster 12/24/2022, 10:58 AM

## 2022-12-25 NOTE — Progress Notes (Signed)
PROGRESS NOTE   Subjective/Complaints:  Pt without new issues today. Denies pain. Sleeping fairly well.   ROS: Patient denies fever, rash, sore throat, blurred vision, dizziness, nausea, vomiting, diarrhea, cough, shortness of breath or chest pain, joint or back/neck pain, headache, or mood change.    Objective:   No results found. No results for input(s): "WBC", "HGB", "HCT", "PLT" in the last 72 hours.  No results for input(s): "NA", "K", "CL", "CO2", "GLUCOSE", "BUN", "CREATININE", "CALCIUM" in the last 72 hours.   Intake/Output Summary (Last 24 hours) at 12/25/2022 0943 Last data filed at 12/25/2022 0715 Gross per 24 hour  Intake 477 ml  Output --  Net 477 ml        Physical Exam: Vital Signs Blood pressure 117/76, pulse 76, temperature 98.9 F (37.2 C), resp. rate 18, height 5\' 7"  (1.702 m), weight 67.4 kg, SpO2 97%.  Constitutional: No distress . Vital signs reviewed. HEENT: NCAT, EOMI, oral membranes moist Neck: supple Cardiovascular: RRR without murmur. No JVD    Respiratory/Chest: CTA Bilaterally without wheezes or rales. Normal effort    GI/Abdomen: BS +, non-tender, non-distended Ext: no clubbing, cyanosis, or edema Psych: pleasant and cooperative, a little flat  Skin: No evidence of breakdown, no evidence of rash Neurologic: Cranial nerves II through XII intact, motor strength is 5/5 in right 4/5 left deltoid, bicep, tricep, grip, hip flexor, knee extensors, ankle dorsiflexor and plantar flexor  Left homonymous hemianopsia but turns head to left to compensate Orient to person and hospital, reason he's here Musculoskeletal: Full range of motion in all 4 extremities. No joint swelling    Assessment/Plan: 1. Functional deficits which require 3+ hours per day of interdisciplinary therapy in a comprehensive inpatient rehab setting. Physiatrist is providing close team supervision and 24 hour management of  active medical problems listed below. Physiatrist and rehab team continue to assess barriers to discharge/monitor patient progress toward functional and medical goals  Care Tool:  Bathing    Body parts bathed by patient: Right arm, Left arm, Abdomen, Chest, Buttocks, Front perineal area, Left upper leg, Right upper leg, Right lower leg, Left lower leg, Face         Bathing assist Assist Level: Minimal Assistance - Patient > 75%     Upper Body Dressing/Undressing Upper body dressing   What is the patient wearing?: Pull over shirt    Upper body assist Assist Level: Minimal Assistance - Patient > 75%    Lower Body Dressing/Undressing Lower body dressing      What is the patient wearing?: Underwear/pull up, Pants     Lower body assist Assist for lower body dressing: Minimal Assistance - Patient > 75%     Toileting Toileting    Toileting assist Assist for toileting: Contact Guard/Touching assist     Transfers Chair/bed transfer  Transfers assist     Chair/bed transfer assist level: Minimal Assistance - Patient > 75% Chair/bed transfer assistive device:  (none)   Locomotion Ambulation   Ambulation assist      Assist level: Minimal Assistance - Patient > 75% Assistive device: No Device Max distance: 200+   Walk 10 feet activity   Assist  Assist level: Minimal Assistance - Patient > 75% Assistive device: No Device   Walk 50 feet activity   Assist    Assist level: Minimal Assistance - Patient > 75% Assistive device: No Device    Walk 150 feet activity   Assist    Assist level: Minimal Assistance - Patient > 75% Assistive device: No Device    Walk 10 feet on uneven surface  activity   Assist Walk 10 feet on uneven surfaces activity did not occur: Safety/medical concerns         Wheelchair     Assist Is the patient using a wheelchair?: No Type of Wheelchair: Manual    Wheelchair assist level: Dependent - Patient 0% Max  wheelchair distance: 300 ft    Wheelchair 50 feet with 2 turns activity    Assist        Assist Level: Dependent - Patient 0%   Wheelchair 150 feet activity     Assist      Assist Level: Dependent - Patient 0%   Blood pressure 117/76, pulse 76, temperature 98.9 F (37.2 C), resp. rate 18, height 5\' 7"  (1.702 m), weight 67.4 kg, SpO2 97%.  Medical Problem List and Plan: 1. Functional deficits secondary to R PCA thrombotic CVA, cognitive deficits, poor balance and Left homonymous hemianopsia - may have greater involvement of left lower quadrant             -patient may shower             -ELOS/Goals: 8/29, SPV PT/OT/SLP- HH therapy post discharge   -Continue CIR therapies including PT, OT, and SLP    2.  Antithrombotics: -DVT/anticoagulation:  Pharmaceutical: Lovenox             -antiplatelet therapy: Plavix  3. Pain Management:  Tylenol prn.   - 0 pain reported on nursing assessments; monitor  4. Mood/Behavior/Sleep/at bedtime aggitation:  LCSW to follow for evaluation and support.              -antipsychotic agents: N/A             -Zyprexa for agitation , has mostly had confusion rather than agitation    5. Neuropsych/cognition: This patient is not capable of making decisions on his own behalf.              - Delirium precautions, telesitter and bed alarm, utilize aides for orientation (hearing aides, glasses)  6. Skin/Wound Care: Routine pressure relief measures.  7. Fluids/Electrolytes/Nutrition: Monitor I/O. Check CMET in am. Baseline OP Cr appears 1.1-1.2.   - 8/19: BUN down, Cr up slightly; Encourage Po fluids, monitor I/Os, recheck in AM Intake 700-869mL per day for last 2 days     Latest Ref Rng & Units 12/20/2022    6:04 AM 12/19/2022    5:56 AM 12/16/2022    6:15 AM  BMP  Glucose 70 - 99 mg/dL 98  536  644   BUN 8 - 23 mg/dL 26  21  25    Creatinine 0.61 - 1.24 mg/dL 0.34  7.42  5.95   Sodium 135 - 145 mmol/L 137  138  137   Potassium 3.5 - 5.1  mmol/L 3.9  3.5  3.9   Chloride 98 - 111 mmol/L 103  106  106   CO2 22 - 32 mmol/L 24  23  22    Calcium 8.9 - 10.3 mg/dL 8.7  8.5  8.6    6/38- liquid intake not great (only 480cc yesterday)--check  labs tomorrow   -encourage PO  8. HTN: Monitor BP TID--not on any meds PTA.  - 8/25 controlled Vitals:   12/24/22 2011 12/25/22 0536  BP: 116/69 117/76  Pulse: 74 76  Resp: 18 18  Temp: 98 F (36.7 C) 98.9 F (37.2 C)  SpO2: 98% 97%    9. H/o GIB X 2: Will resume Protonix and monitor stool for occult blood.             --monitor H/H   - HgB stable, LBM 8/18 large nonbloody; monitor  -recheck 8/26    Latest Ref Rng & Units 12/19/2022    5:56 AM 12/16/2022    6:15 AM 12/12/2022    7:49 AM  CBC  WBC 4.0 - 10.5 K/uL 9.6  8.3  7.0   Hemoglobin 13.0 - 17.0 g/dL 16.1  09.6  04.5   Hematocrit 39.0 - 52.0 % 42.8  44.0  44.9   Platelets 150 - 400 K/uL 304  281  299     10. Anxiety d/o: Continue Buspar bid--mood calm at present    11. Cardiac monitoring: Has Zio patch/event monitor. Wife with multiple questions WU:JWJX,BJY, care.              Dr Juliann Pares is cardiologist    LOS: 10 days A FACE TO FACE EVALUATION WAS PERFORMED  Clayton [name] 12/25/2022, 9:43 AM

## 2022-12-26 DIAGNOSIS — I10 Essential (primary) hypertension: Secondary | ICD-10-CM

## 2022-12-26 DIAGNOSIS — K922 Gastrointestinal hemorrhage, unspecified: Secondary | ICD-10-CM

## 2022-12-26 DIAGNOSIS — N179 Acute kidney failure, unspecified: Secondary | ICD-10-CM

## 2022-12-26 LAB — BASIC METABOLIC PANEL
Anion gap: 8 (ref 5–15)
BUN: 21 mg/dL (ref 8–23)
CO2: 21 mmol/L — ABNORMAL LOW (ref 22–32)
Calcium: 8.4 mg/dL — ABNORMAL LOW (ref 8.9–10.3)
Chloride: 106 mmol/L (ref 98–111)
Creatinine, Ser: 1.46 mg/dL — ABNORMAL HIGH (ref 0.61–1.24)
GFR, Estimated: 47 mL/min — ABNORMAL LOW (ref >60)
Glucose, Bld: 89 mg/dL (ref 70–99)
Potassium: 3.5 mmol/L (ref 3.5–5.1)
Sodium: 135 mmol/L (ref 135–145)

## 2022-12-26 LAB — CBC
HCT: 41.5 % (ref 39.0–52.0)
Hemoglobin: 13.9 g/dL (ref 13.0–17.0)
MCH: 32.3 pg (ref 26.0–34.0)
MCHC: 33.5 g/dL (ref 30.0–36.0)
MCV: 96.5 fL (ref 80.0–100.0)
Platelets: 280 10*3/uL (ref 150–400)
RBC: 4.3 MIL/uL (ref 4.22–5.81)
RDW: 13.5 % (ref 11.5–15.5)
WBC: 8.4 10*3/uL (ref 4.0–10.5)
nRBC: 0 % (ref 0.0–0.2)

## 2022-12-26 LAB — OCCULT BLOOD X 1 CARD TO LAB, STOOL: Fecal Occult Bld: NEGATIVE

## 2022-12-26 MED ORDER — SODIUM CHLORIDE 0.9 % IV SOLN
INTRAVENOUS | Status: DC
  Filled 2022-12-26 (×4): qty 1000

## 2022-12-26 MED ORDER — SODIUM CHLORIDE 0.9 % IV SOLN: INTRAVENOUS | Status: DC

## 2022-12-26 NOTE — Progress Notes (Signed)
Speech Language Pathology Daily Session Note  Patient Details  Name: Clayton [name] MRN: [id number] Date of Birth: June 16, 1934  Today's Date: 12/26/2022 SLP Individual Time: 1300-1345 SLP Individual Time Calculation (min): 45 min  Short Term Goals: Week 2: SLP Short Term Goal 2 (Week 2): LTG = STG due to ELOS  Skilled Therapeutic Interventions: Skilled therapy session focused on cognitive and communicative goals. SLP addressed communication through use of respiratory training and diaphragmatic breathing exercises. Patient required re-education regarding all breathing exercises, however was then able to execute them well. Wife noted decreased vocal intensity over the last week and SLP explained importance of continuing respiratory training outside of sessions (5 repetitions, 5 times a day). SLP addressed cognition through L attention and orientation tasks. Patient was not able to orient to time/location using external aids this date and required max verbal A. During attention tasks, patient required mod visual and verbal A to find items in a picture. Patient benefited from picture split into four quadrants. Patient left in bed with call bell and alarm set. Wife present. Continue POC.   Pain No pain reported  Therapy/Group: Individual Therapy  Lasheka Kempner M.A., CF-SLP 12/26/2022, 3:32 PM

## 2022-12-26 NOTE — Progress Notes (Signed)
PROGRESS NOTE   Subjective/Complaints:  Pt with no new concerns this AM. Reports he feels OK. Wife reports he continues to have intermittent confusion.   ROS: Patient denies fever, rash, sore throat, blurred vision, dizziness, nausea, vomiting, diarrhea, cough, shortness of breath or chest pain, joint or back/neck pain, headache, or mood change.    Objective:   No results found. Recent Labs    12/26/22 0528  WBC 8.4  HGB 13.9  HCT 41.5  PLT 280    Recent Labs    12/26/22 0528  NA 135  K 3.5  CL 106  CO2 21*  GLUCOSE 89  BUN 21  CREATININE 1.46*  CALCIUM 8.4*     Intake/Output Summary (Last 24 hours) at 12/26/2022 9604 Last data filed at 12/25/2022 1755 Gross per 24 hour  Intake 240 ml  Output --  Net 240 ml        Physical Exam: Vital Signs Blood pressure 127/73, pulse 82, temperature (!) 97.5 F (36.4 C), temperature source Oral, resp. rate 18, height 5\' 7"  (1.702 m), weight 67.4 kg, SpO2 97%.  Constitutional: No distress . Vital signs reviewed. HEENT: NCAT, EOMI, oral membranes a little dry Neck: supple Cardiovascular: RRR without murmur. No JVD    Respiratory/Chest: CTA Bilaterally without wheezes or rales. Normal effort    GI/Abdomen: BS +, non-tender, non-distended Ext: no clubbing, cyanosis, or edema Psych: pleasant and cooperative, a little flat  Skin: No evidence of breakdown, no evidence of rash Neurologic: Cranial nerves II through XII intact, motor strength is 5/5 in right 4/5 left deltoid, bicep, tricep, grip, hip flexor, knee extensors, ankle dorsiflexor and plantar flexor  Left homonymous hemianopsia but turns head to left to compensate Orient to person and hospital, reason he's here Musculoskeletal: Full range of motion in all 4 extremities. No joint swelling    Assessment/Plan: 1. Functional deficits which require 3+ hours per day of interdisciplinary therapy in a comprehensive  inpatient rehab setting. Physiatrist is providing close team supervision and 24 hour management of active medical problems listed below. Physiatrist and rehab team continue to assess barriers to discharge/monitor patient progress toward functional and medical goals  Care Tool:  Bathing    Body parts bathed by patient: Right arm, Left arm, Abdomen, Chest, Buttocks, Front perineal area, Left upper leg, Right upper leg, Right lower leg, Left lower leg, Face         Bathing assist Assist Level: Minimal Assistance - Patient > 75%     Upper Body Dressing/Undressing Upper body dressing   What is the patient wearing?: Pull over shirt    Upper body assist Assist Level: Minimal Assistance - Patient > 75%    Lower Body Dressing/Undressing Lower body dressing      What is the patient wearing?: Underwear/pull up, Pants     Lower body assist Assist for lower body dressing: Minimal Assistance - Patient > 75%     Toileting Toileting    Toileting assist Assist for toileting: Contact Guard/Touching assist     Transfers Chair/bed transfer  Transfers assist     Chair/bed transfer assist level: Minimal Assistance - Patient > 75% Chair/bed transfer assistive device:  (none)  Locomotion Ambulation   Ambulation assist      Assist level: Minimal Assistance - Patient > 75% Assistive device: No Device Max distance: 200+   Walk 10 feet activity   Assist     Assist level: Minimal Assistance - Patient > 75% Assistive device: No Device   Walk 50 feet activity   Assist    Assist level: Minimal Assistance - Patient > 75% Assistive device: No Device    Walk 150 feet activity   Assist    Assist level: Minimal Assistance - Patient > 75% Assistive device: No Device    Walk 10 feet on uneven surface  activity   Assist Walk 10 feet on uneven surfaces activity did not occur: Safety/medical concerns         Wheelchair     Assist Is the patient using a  wheelchair?: No Type of Wheelchair: Manual    Wheelchair assist level: Dependent - Patient 0% Max wheelchair distance: 300 ft    Wheelchair 50 feet with 2 turns activity    Assist        Assist Level: Dependent - Patient 0%   Wheelchair 150 feet activity     Assist      Assist Level: Dependent - Patient 0%   Blood pressure 127/73, pulse 82, temperature (!) 97.5 F (36.4 C), temperature source Oral, resp. rate 18, height 5\' 7"  (1.702 m), weight 67.4 kg, SpO2 97%.  Medical Problem List and Plan: 1. Functional deficits secondary to R PCA thrombotic CVA, cognitive deficits, poor balance and Left homonymous hemianopsia - may have greater involvement of left lower quadrant             -patient may shower             -ELOS/Goals: 8/29, [facility] PT/OT/SLP- HH therapy post discharge   -Continue CIR therapies including PT, OT, and SLP    -Est discharge 8/29  2.  Antithrombotics: -DVT/anticoagulation:  Pharmaceutical: Lovenox             -antiplatelet therapy: Plavix  3. Pain Management:  Tylenol prn.   - 0 pain reported on nursing assessments; monitor  4. Mood/Behavior/Sleep/at bedtime aggitation:  LCSW to follow for evaluation and support.              -antipsychotic agents: N/A             -Zyprexa for agitation , has mostly had confusion rather than agitation      5. Neuropsych/cognition: This patient is not capable of making decisions on his own behalf.              - Delirium precautions, telesitter and bed alarm, utilize aides for orientation (hearing aides, glasses)  -8/26 Discussed deliirium with wife, she plans to talk more about behavioral strategies with SLP today  6. Skin/Wound Care: Routine pressure relief measures.  7. Fluids/Electrolytes/Nutrition: Monitor I/O. Check CMET in am. Baseline OP Cr appears 1.1-1.2.   - 8/19: BUN down, Cr up slightly; Encourage Po fluids, monitor I/Os, recheck in AM Intake 700-864mL per day for last 2 days     Latest Ref Rng &  Units 12/26/2022    5:28 AM 12/20/2022    6:04 AM 12/19/2022    5:56 AM  BMP  Glucose 70 - 99 mg/dL 89  98  308   BUN 8 - 23 mg/dL 21  26  21    Creatinine 0.61 - 1.24 mg/dL 6.57  8.46  9.62   Sodium 135 -  145 mmol/L 135  137  138   Potassium 3.5 - 5.1 mmol/L 3.5  3.9  3.5   Chloride 98 - 111 mmol/L 106  103  106   CO2 22 - 32 mmol/L 21  24  23    Calcium 8.9 - 10.3 mg/dL 8.4  8.7  8.5    6/29- liquid intake not great (only 480cc yesterday)--check labs tomorrow   -encourage PO  8/26 Will start IVF, CR up to 1.48  8. HTN: Monitor BP TID--not on any meds PTA.  - 8/26 controlled, continue to monitor Vitals:   12/25/22 1912 12/26/22 0406  BP: 125/69 127/73  Pulse: 76 82  Resp: 18 18  Temp: 98.4 F (36.9 C) (!) 97.5 F (36.4 C)  SpO2: 96% 97%    9. H/o GIB X 2: Will resume Protonix and monitor stool for occult blood.             --monitor H/H   - HgB stable, LBM 8/18 large nonbloody; monitor  -8/26 HGB stable at 13.9    Latest Ref Rng & Units 12/26/2022    5:28 AM 12/19/2022    5:56 AM 12/16/2022    6:15 AM  CBC  WBC 4.0 - 10.5 K/uL 8.4  9.6  8.3   Hemoglobin 13.0 - 17.0 g/dL 52.8  41.3  24.4   Hematocrit 39.0 - 52.0 % 41.5  42.8  44.0   Platelets 150 - 400 K/uL 280  304  281     10. Anxiety d/o: Continue Buspar bid--mood calm at present    11. Cardiac monitoring: Has Zio patch/event monitor. Wife with multiple questions WN:UUVO,ZDG, care.              Dr Juliann Pares is cardiologist  -Denies Chest pain    LOS: 11 days A FACE TO FACE EVALUATION WAS PERFORMED  Fanny Dance 12/26/2022, 8:12 AM

## 2022-12-26 NOTE — Progress Notes (Signed)
Occupational Therapy Session Note  Patient Details  Name: Clayton [name] MRN: [id number] Date of Birth: 05-15-1934  Today's Date: 12/26/2022 OT Individual Time: 5643-3295 OT Individual Time Calculation (min): 60 min  and Today's Date: 12/26/2022 OT Missed Time: 15 Minutes Missed Time Reason: Patient fatigue (Pt reporting fatigue and requesting to end session early)   Short Term Goals: Week 2:  OT Short Term Goal 1 (Week 2): STG=LTG d/t ELOS  Skilled Therapeutic Interventions/Progress Updates:     Pt received semi-reclined in bed presenting to be in good spirits receptive to skilled OT session reporting 0/10 pain- OT offering intermittent rest breaks, repositioning, and therapeutic support to optimize participation in therapy session. Pt's wife in room upon OT arrival and receptive to participating in therapy session. Pt dressed and ready for the day, polity declining need for shower this session. Focus this session L side attention, BADL retraining, functional mobility training, and activity tolerance.   Pt transitioned to EOB using bed rails with supervision. Donned shoes EOB supervision with increased time. Engaged Pt in light grooming/hygiene tasks standing at sink to work on dynamic standing balance, attention, and L visual scanning with toiletry items placed to Pt's L side. Pt required mod verbal cues to utilize compensatory visual scanning technique to locate items with pt then able to brush teeth with CGA provided for balance to increase safety.   Pt requesting BR. Pt completed functional mobility to bathroom using RW and transferred to toilet CGA. Provided education to Pt's wife on assisting Pt with toilet transfers and assisting Pt with toileting tasks with emphasis on safety considerations, simple verbal cues, and strategies to ensure cleanliness. Pt's wife verbalizing understanding and able to observe OT on strategies during therapy session. Checked off Pt's wife on assisting Pt to  bathroom between therapy sessions. Pt ambulated to sink and stood with CGA while washing hands.   Pt completed ~200 ft functional mobility to therapy gym using RW with CGA. Facilitated Pt's wife assisting Pt during functional mobility to allow increased opportunities to practice providing verbal/tactile cues with OT providing feedback to improve carryover for d/c.   Engaged Pt in series of visual scanning activities while standing with RW at [facility] to improve Pt's attention to L side and to work on integrating L visual scanning techniques into all tasks. Pt completed visual scanning single target time paced task for 5 minute with 120 hits total 97.77% accuracy and reaction time of 2.41 sec. Pt noted to require increased time to notice stimuli on L compared to the R, however with increased practice Pt became more attentive to L side. Pt very motivated by task. Pt then completed trail making part A in 5:48 min with 12 errors with 10 interruptions. Pt able to maintain attention to task with no verbal cues required and locate stimuli on L side with min-mod questioning verbal cues.   Pt reporting fatigue following with rest break provided. Pt requesting to end session early and return to bed. Pt completed functional mobility back to room using RW with CGA while visually scanning form L to R to avoid obstacles with mod verbal cues required for safety. Pt returned to bed with CGA. Pt was left resting in bed with call bell in reach, bed alarm on, and all needs met.    Therapy Documentation Precautions:  Precautions Precautions: Fall Precaution Comments: mild L inattention to surroundings Restrictions Weight Bearing Restrictions: No General: General OT Amount of Missed Time: 15 Minutes   Therapy/Group: Individual Therapy  Clayton [name] 12/26/2022, 3:23 PM

## 2022-12-26 NOTE — Progress Notes (Signed)
Pt pulled IV out X2.  IV site clean/dry/intact. Pt appears to be confused after reality orientation. Needs to reinforce education with IV fluids administration. IV consult initiated for new IV site. Charge nurse notified.   Dominica Severin   RN

## 2022-12-26 NOTE — Progress Notes (Signed)
Physical Therapy Session Note  Patient Details  Name: Clayton [name] MRN: [id number] Date of Birth: October 10, 1934  Today's Date: 12/26/2022 PT Individual Time: 1000-1110 PT Individual Time Calculation (min): 70 min   Short Term Goals: Week 2:  PT Short Term Goal 1 (Week 2): = to LTGs based on ELOS  Skilled Therapeutic Interventions/Progress Updates:    Chart reviewed and pt agreeable to therapy. Pt received semi-reclined in bed with no c/o pain. Also of note, wife present t/o session for training on supervision support. Session focused on amb endurance and safety with wife present for education on safe supervision to promote safe home access. During session, pt completed multiple rounds of amb ranging 212ft-400ft using close S + RW. T/o amb, PT discussed safety points including observing fatigue in LLE and RW management around turns to prevent falls, with wife and pt verbalizing understanding. PT, pt, and wife also discussed continued balance training across lifespan. In room, wife displayed good ability to supervise and support safe transfer between WC<>bed, so wife cleared for transfers in room. At end of session, pt was left seated in [facility] with wife present, nurse call bell and all needs in reach.     Therapy Documentation Precautions:  Precautions Precautions: Fall Precaution Comments: mild L inattention to surroundings Restrictions Weight Bearing Restrictions: No General:      Therapy/Group: Individual Therapy  Dionne Milo, PT, DPT 12/26/2022, 12:09 PM

## 2022-12-26 NOTE — Progress Notes (Signed)
Speech Language Pathology Daily Session Note  Patient Details  Name: Clayton [name] MRN: [id number] Date of Birth: April 19, 1935  Today's Date: 12/26/2022 SLP Individual Time: 1115-1200 SLP Individual Time Calculation (min): 45 min  Short Term Goals: Week 1: SLP Short Term Goal 1 (Week 1): STGs=LTGs 2* ELOS  Skilled Therapeutic Interventions:   Pt and his wife greeted at bedside. He was awake/alert upon SLP arrival and agreeable to tx tasks targeting cognition. He benefited from [facility] visual/verbal cues for visual scanning to the L during calendar and orientation review at the beginning of the tx session. He was able to recall city/place INDly. SLP then facilitated card sort task (fo 4) and pt benefited from minA visual/verbal cues for visual scanning. He INDly maintained attention to task for its duration; ~10 mins. He benefited from [facility] verbal cues to recall current date after 10 min delay x2. Additionally, he participated in verbal organization and working memory task ID and adding 1 item to the category. He benefited from [employer] cues for working memory throughout task. He remains motivated overall, even asking his wife "did I do good for her?" As SLP was leaving the room. He was left in his chair with the alarm set and call light within reach. Wife present upon SLP departure as well. Recommend cont ST per POC.   Pain  No pain reported.   Therapy/Group: Individual Therapy  Pati Gallo 12/26/2022, 4:54 PM

## 2022-12-27 DIAGNOSIS — G4700 Insomnia, unspecified: Secondary | ICD-10-CM

## 2022-12-27 MED ORDER — MELATONIN 5 MG PO TABS
5.0000 mg | ORAL_TABLET | Freq: Every day | ORAL | Status: DC
  Administered 2022-12-27 – 2022-12-28 (×2): 5 mg via ORAL
  Filled 2022-12-27 (×2): qty 1

## 2022-12-27 NOTE — Progress Notes (Signed)
Occupational Therapy Session Note  Patient Details  Name: Clayton [name] MRN: [id number] Date of Birth: Aug 23, 1934  Today's Date: 12/27/2022 OT Individual Time: 1300-1359 OT Individual Time Calculation (min): 59 min    Short Term Goals: Week 2:  OT Short Term Goal 1 (Week 2): STG=LTG d/t ELOS  Skilled Therapeutic Interventions/Progress Updates:     Pt received sitting up in wc with wife present in room. Pt presenting with flat affect and to be more fatigued this session, however receptive to skilled OT session reporting 0/10 pain- OT offering intermittent rest breaks, repositioning, and therapeutic support to optimize participation in therapy session. Pt's wife present during portions of therapy session, however OT recommended Pt's wife step out of the room for lunch and take a short rest break to allow mental break. 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 Pt receptive to taking shower this session. Pt completed functional mobility using RW to bathroom with min verbal cues required to locate bathroom. Pt sat on toilet to doff clothing with close supervision for safety. Pt completed ambulatory transfer into shower using RW with supervision with mod verbal cues provided for technique. Pt able to complete U/LB bathing while seated TTB with supervision with mod verbal cues required to sequence through bathing each body part and notice need to rinse soap. Pt completed stand step using RW and ambulated to room close supervision. Pt able to locate 3/3 clothing items placed on Pt's left side with min questioning cues. Pt donned shirt mod I and pants close supervision for safety using RW for balance in standing. Pt donned socks and shoes EOB with increased time and completed stand step EOB>wc using RW CGA for balance. Pt's wife reporting concerns with taking pt home d/t fear of  Pt falling or not being able to assist pt in/out of the house. Provided therapeutic support and gentle education on fall prevention and fall recovery verbalizing steps and providing demonstration. Did not practice fall recovery this session d/t time constraints and plans for PT to practice in their upcoming session. Provided education on safety features such as alarms and child lock for cars to increase safety with Pt's wife receptive to all education provided. Pt was left resting in wc with call bell in reach, wife present in room, and all needs met.    Therapy Documentation Precautions:  Precautions Precautions: Fall Precaution Comments: mild L inattention to surroundings Restrictions Weight Bearing Restrictions: No  Therapy/Group: Individual Therapy  Clide Deutscher 12/27/2022, 8:01 AM

## 2022-12-27 NOTE — Progress Notes (Signed)
PROGRESS NOTE   Subjective/Complaints: Appears he pulled out his IVs last night.  His wife reports he had trouble sleeping last night.  His wife feels like his responses to my questions yesterday were inaccurate because he needed to go to the bathroom at the time  ROS: Denies fever, chills, shortness of breath, chest pain, N/V/D, new motor or sensory changes + Insomnia   Objective:   No results found. Recent Labs    12/26/22 0528  WBC 8.4  HGB 13.9  HCT 41.5  PLT 280    Recent Labs    12/26/22 0528  NA 135  K 3.5  CL 106  CO2 21*  GLUCOSE 89  BUN 21  CREATININE 1.46*  CALCIUM 8.4*     Intake/Output Summary (Last 24 hours) at 12/27/2022 1236 Last data filed at 12/26/2022 1759 Gross per 24 hour  Intake 240 ml  Output --  Net 240 ml        Physical Exam: Vital Signs Blood pressure 137/68, pulse 93, temperature 99.2 F (37.3 C), temperature source Oral, resp. rate 16, height 5\' 7"  (1.702 m), weight 67.4 kg, SpO2 95%.  Constitutional: No distress . Vital signs reviewed.  Lying in bed HEENT: NCAT, EOMI, oral membranes moist Neck: supple Cardiovascular: RRR without murmur. No JVD    Respiratory/Chest: CTA Bilaterally without wheezes or rales. Normal effort    GI/Abdomen: BS +, non-tender, non-distended Ext: no clubbing, cyanosis, or edema Psych: pleasant and cooperative, a little flat  Skin: No evidence of breakdown, no evidence of rash Neurologic: Cranial nerves II through XII intact, motor strength is 5/5 in right 4/5 left deltoid, bicep, tricep, grip, hip flexor, knee extensors, ankle dorsiflexor and plantar flexor  Left homonymous hemianopsia but turns head to left to compensate Oriented to hospital but says in [place], not oriented to month or year-he says I do not know Musculoskeletal: Full range of motion in all 4 extremities. No joint swelling    Assessment/Plan: 1. Functional deficits which  require 3+ hours per day of interdisciplinary therapy in a comprehensive inpatient rehab setting. Physiatrist is providing close team supervision and 24 hour management of active medical problems listed below. Physiatrist and rehab team continue to assess barriers to discharge/monitor patient progress toward functional and medical goals  Care Tool:  Bathing    Body parts bathed by patient: Right arm, Left arm, Abdomen, Chest, Buttocks, Front perineal area, Left upper leg, Right upper leg, Right lower leg, Left lower leg, Face         Bathing assist Assist Level: Minimal Assistance - Patient > 75%     Upper Body Dressing/Undressing Upper body dressing   What is the patient wearing?: Pull over shirt    Upper body assist Assist Level: Minimal Assistance - Patient > 75%    Lower Body Dressing/Undressing Lower body dressing      What is the patient wearing?: Underwear/pull up, Pants     Lower body assist Assist for lower body dressing: Minimal Assistance - Patient > 75%     Toileting Toileting    Toileting assist Assist for toileting: Contact Guard/Touching assist     Transfers Chair/bed transfer  Transfers assist  Chair/bed transfer assist level: Minimal Assistance - Patient > 75% Chair/bed transfer assistive device:  (none)   Locomotion Ambulation   Ambulation assist      Assist level: Minimal Assistance - Patient > 75% Assistive device: No Device Max distance: 200+   Walk 10 feet activity   Assist     Assist level: Minimal Assistance - Patient > 75% Assistive device: No Device   Walk 50 feet activity   Assist    Assist level: Minimal Assistance - Patient > 75% Assistive device: No Device    Walk 150 feet activity   Assist    Assist level: Minimal Assistance - Patient > 75% Assistive device: No Device    Walk 10 feet on uneven surface  activity   Assist Walk 10 feet on uneven surfaces activity did not occur: Safety/medical  concerns         Wheelchair     Assist Is the patient using a wheelchair?: No Type of Wheelchair: Manual    Wheelchair assist level: Dependent - Patient 0% Max wheelchair distance: 300 ft    Wheelchair 50 feet with 2 turns activity    Assist        Assist Level: Dependent - Patient 0%   Wheelchair 150 feet activity     Assist      Assist Level: Dependent - Patient 0%   Blood pressure 137/68, pulse 93, temperature 99.2 F (37.3 C), temperature source Oral, resp. rate 16, height 5\' 7"  (1.702 m), weight 67.4 kg, SpO2 95%.  Medical Problem List and Plan: 1. Functional deficits secondary to R PCA thrombotic CVA, cognitive deficits, poor balance and Left homonymous hemianopsia - may have greater involvement of left lower quadrant             -patient may shower             -ELOS/Goals: 8/29, SPV PT/OT/SLP- HH therapy post discharge   -Continue CIR therapies including PT, OT, and SLP    -Est discharge 8/29  2.  Antithrombotics: -DVT/anticoagulation:  Pharmaceutical: Lovenox             -antiplatelet therapy: Plavix  3. Pain Management:  Tylenol prn.   - 0 pain reported on nursing assessments; monitor  4. Mood/Behavior/Sleep/at bedtime aggitation:  LCSW to follow for evaluation and support.              -antipsychotic agents: N/A             -Zyprexa for agitation , has mostly had confusion rather than agitation      5. Neuropsych/cognition: This patient is not capable of making decisions on his own behalf.              - Delirium precautions, telesitter and bed alarm, utilize aides for orientation (hearing aides, glasses)  -8/26 Discussed deliirium with wife, she plans to talk more about behavioral strategies with SLP today  6. Skin/Wound Care: Routine pressure relief measures.  7. Fluids/Electrolytes/Nutrition: Monitor I/O. Check CMET in am. Baseline OP Cr appears 1.1-1.2.   - 8/19: BUN down, Cr up slightly; Encourage Po fluids, monitor I/Os, recheck in  AM Intake 700-810mL per day for last 2 days     Latest Ref Rng & Units 12/26/2022    5:28 AM 12/20/2022    6:04 AM 12/19/2022    5:56 AM  BMP  Glucose 70 - 99 mg/dL 89  98  098   BUN 8 - 23 mg/dL 21  26  21  Creatinine 0.61 - 1.24 mg/dL 1.61  0.96  0.45   Sodium 135 - 145 mmol/L 135  137  138   Potassium 3.5 - 5.1 mmol/L 3.5  3.9  3.5   Chloride 98 - 111 mmol/L 106  103  106   CO2 22 - 32 mmol/L 21  24  23    Calcium 8.9 - 10.3 mg/dL 8.4  8.7  8.5    4/09- liquid intake not great (only 480cc yesterday)--check labs tomorrow   -encourage PO  8/26 Will start IVF, CR up to 1.48  8/27 Recheck bmp tomorrow  8. HTN: Monitor BP TID--not on any meds PTA.  - 8/27 well-controlled, continue to monitor Vitals:   12/26/22 1922 12/27/22 0415  BP: 129/68 137/68  Pulse: 86 93  Resp:  16  Temp: 98.6 F (37 C) 99.2 F (37.3 C)  SpO2: 97% 95%    9. H/o GIB X 2: Will resume Protonix and monitor stool for occult blood.             --monitor H/H   - HgB stable, LBM 8/18 large nonbloody; monitor  -8/26 HGB stable at 13.9    Latest Ref Rng & Units 12/26/2022    5:28 AM 12/19/2022    5:56 AM 12/16/2022    6:15 AM  CBC  WBC 4.0 - 10.5 K/uL 8.4  9.6  8.3   Hemoglobin 13.0 - 17.0 g/dL 81.1  91.4  78.2   Hematocrit 39.0 - 52.0 % 41.5  42.8  44.0   Platelets 150 - 400 K/uL 280  304  281     10. Anxiety d/o: Continue Buspar bid--mood calm at present    11. Cardiac monitoring: Has Zio patch/event monitor. Wife with multiple questions NF:AOZH,YQM, care.              Dr Juliann Pares is cardiologist  -Denies Chest pain  12.  Insomnia  -Currently on 75 mg Seroquel nightly, has trazodone as needed  -8/27 Will start melatonin, his wife reports he has used this in the past and tolerated    LOS: 12 days A FACE TO FACE EVALUATION WAS PERFORMED  Fanny Dance 12/27/2022, 12:36 PM

## 2022-12-27 NOTE — Progress Notes (Signed)
Speech Language Pathology Daily Session Note  Patient Details  Name: Clayton [name] MRN: [id number] Date of Birth: [birth year]  Today's Date: 12/27/2022 SLP Individual Time: 4696-2952 SLP Individual Time Calculation (min): 25 min  Short Term Goals: Week 2: SLP Short Term Goal 2 (Week 2): LTG = STG due to ELOS  Skilled Therapeutic Interventions: Skilled therapy session focused on family education and respiratory strength exercises for speech intelligibility. SLP provided education regarding patient current level of cognitive functioning and use of external aids at home. SLP recommended use of calendars and orientation posters (location, situation) upon d/c and continuation of activities to bring attention to L side (puzzles, Ispy, placing motivating items on L). SLP educated wife and patient on importance of continuing respiratory strength/diaphragmatic breathing exercises post d/c multiple times a day to aid in vocal intensity. Pt/family verbalized understanding. SLP addressed speech intelligibility through repeating instructions for patient to complete respiratory exercises. Patient able to complete exercises independently after re-explanation. Patient left in [facility] with alarm set and call bell in reach. Continue POC.  Pain No pain reported  Therapy/Group: Individual Therapy  Maiko Salais M.A., CF-SLP 12/27/2022, 4:26 PM

## 2022-12-27 NOTE — Progress Notes (Signed)
Physical Therapy Session Note  Patient Details  Name: Clayton [name] MRN: [id number] Date of Birth: 09-Apr-1935  Today's Date: 12/27/2022 PT Individual Time: 0920-1017 and 4098-1191 PT Individual Time Calculation (min): 57 min and 60 min  Short Term Goals: Week 2:  PT Short Term Goal 1 (Week 2): = to LTGs based on ELOS  Skilled Therapeutic Interventions/Progress Updates:    Session 1: Pt received supine in bed asleep with his wife, [name], present and upon awakening pt agreeable to therapy session.   Pt's wife expressing many concerns this morning including:  - pt not resting well at night  causing him to be drowsy this morning and him having difficulty even identifying her this morning - her feeling she is having to advocate for patient and not always knowing if it is being received well and/or if she is advocating to the correct person  - concerns regarding pt's impaired memory and her difficulty with knowing how to answer him to tell him the truth but also not upsetting him repeatedly (Ex: him asking if his mom called when she passed away 20+ years ago)  recommended she speak more with SLP and MD regarding this - discussed that recommendation is still for pt to use RW at home and pt appearing agreeable to this due to reporting his LEs feel "unstable" in standing and gait; however, he will not have recall of this education so recommended she always have RW accessible when he comes to stand to see if he will naturally use it since he doesn't feel stable  Continued throughout session focusing on pt's L visual scanning.  Supine>sitting L EOB with supervision. Sitting EOB donned sneakers with min A at this time due to fatigue. Sit<>stands using RW with CGA for steadying throughout session.  Gait training ~157ft to nurses station using RW with CGA and pt having more forward trunk flexed posture and shuffled gait pattern at this time, pt also still waking up fully requiring more time to  problem solve how to navigate to the L around obstacles. Goal of having pt recall that he wants to ask nurse for yogurt since he didn't not eat breakfast and visually find the nurse on his L side to engage in this conversation - pt requires either continuous cuing to keep the information in his working memory otherwise he is unable to hold onto this simple information for more than 30 seconds.  Sitting in SLP office pt consumed yogurt without assist while focusing on L visual scanning and reorientation to location and situation.  Gait training additional ~23ft using RW with CGA and focus on L visual scanning and short term memory recall of his room number and navigating back to his room - continues to demo above gait deviations and requires repeated total cuing to keep the information in his working memory otherwise completely unable to recall this information.  At end of session, sitting in w/c, pt unable to recall what he had just completed with this therapist. Pt left seated in w/c with needs in reach and his wife present to assume care of him with her being previously cleared to assist him in the room.   Session 2: Pt received sitting in w/c with his wife, [name], present for formal family education/training; however, education has been provided daily with her as she is present daily. Pt agreeable to therapy session.   Pt overall does not appear to have much energy nor feel very good today, but when questioned does not provide  further insight to his symptoms.   Pt reports urgent need to use bathroom immediately after stating "no" when questioned about toileting needs. Sit>stands w/c>RW with CGA progressing to supervision for safety. Gait training into bathroom using RW with CGA and max/total cuing to orient to bathroom door and toilet on L side. Provided dependent LB clothing management due to urgency and pt starting to be incontinent of bowels;  however, further continent on toilet. Seated  peri-care with set-up assist. Performed LB clothing management on toilet with mod A for time management.   Pt's wife assumed care of pt for hands-on training. Sit>stand toilet>RW with CGA - pt requires min cuing for LB clothing management due to pulling up boxers but not pants. Requires max cuing for safe AD management at counter - completed hand hygiene.  Pt ambulated ~122ft x2 to/from main therapy gym using RW with CGA from his wife on the L side correctly using the gait belt for his security. Pt demos significantly decreased gait speed with short, shuffled steps today and more flexed trunk posturing, all likely associated with pt overall appearing much more fatigued today.  Wife reports she has a Optician, dispensing and therefore pt should be able to turn and sit down in car seat then place LEs into vehicle and she will manage placing RW in/out of vehicle. Discussed utilizing child lock on vehicle door as pt had tendency to get out of the car before she fully parked the car prior to hospitalization.  Discussed with wife safe home entry for pt including: having the dogs put away so they will not jump on pt while he is walking in, preparing the environment for pt ahead of time, and the need for her to manage carrying RW up/down stairs for patient to enter/exit the home.   Pt/wife performed stair navigation as was discussed with wife and therapist - educated wife that pt will not recall this education so she will have to talk him through the sequence, step-by-step, every time they enter/exit the home. Pt performed with CGA/close SBA from pt's wife as she is not able to provide hands-on assist during descent to ensure her safety.  Therapist educated them on need to call 911 if she finds him down on the floor due to pt's impaired cognition since he will not be able to recall and accurately tell her what caused him to fall. Therapist educates her on if she witnesses pt trip and fall how to assess if it is safe to  have him try to crawl and come to sitting on low couch/bed, but otherwise to call 911. Pt performed floor transfer with min A to safely get into the floor and then only CGA for safety coming back to sit on mat with total step-by-step cuing for sequencing.   Therapist educated pt's wife on importance of providing explicit and short cues in anticipation of next step to allow him time to process them.  Discussed options of alarm systems in the home to notify her if/when pt starts to get up without her assist.  At end of session, pt left in care of SLP while walking back to his room with his wife's assistance.   Therapy Documentation Precautions:  Precautions Precautions: Fall Precaution Comments: mild L inattention to surroundings Restrictions Weight Bearing Restrictions: No   Pain:  Session 1: No reports of pain throughout session; however, pt appears fatigued especially at beginning of session.  Session 2: No reports of pain throughout session; however, pt appearing very  fatigued and to not be feeling well.    Therapy/Group: Individual Therapy  Ginny Forth , PT, DPT, NCS, CSRS 12/27/2022, 7:49 AM

## 2022-12-28 LAB — BASIC METABOLIC PANEL
Anion gap: 8 (ref 5–15)
BUN: 18 mg/dL (ref 8–23)
CO2: 21 mmol/L — ABNORMAL LOW (ref 22–32)
Calcium: 8.2 mg/dL — ABNORMAL LOW (ref 8.9–10.3)
Chloride: 106 mmol/L (ref 98–111)
Creatinine, Ser: 1.6 mg/dL — ABNORMAL HIGH (ref 0.61–1.24)
GFR, Estimated: 42 mL/min — ABNORMAL LOW (ref >60)
Glucose, Bld: 86 mg/dL (ref 70–99)
Potassium: 3.8 mmol/L (ref 3.5–5.1)
Sodium: 135 mmol/L (ref 135–145)

## 2022-12-28 MED ORDER — SODIUM CHLORIDE 0.45 % IV SOLN: INTRAVENOUS | Status: DC

## 2022-12-28 MED ORDER — QUETIAPINE FUMARATE 50 MG PO TABS
50.0000 mg | ORAL_TABLET | Freq: Two times a day (BID) | ORAL | Status: DC | PRN
  Administered 2022-12-28: 50 mg via ORAL
  Filled 2022-12-28: qty 1

## 2022-12-28 NOTE — Patient Care Conference (Signed)
2

## 2022-12-28 NOTE — Progress Notes (Signed)
PROGRESS NOTE   Subjective/Complaints: Drowsy this am but reportedly slept well  No issues overnite , discussed elevated creat and need for fluids with wife   ROS: Denies fever, chills, shortness of breath, chest pain, N/V/D, new motor or sensory changes + Insomnia   Objective:   No results found. Recent Labs    12/26/22 0528  WBC 8.4  HGB 13.9  HCT 41.5  PLT 280    Recent Labs    12/26/22 0528 12/28/22 0736  NA 135 135  K 3.5 3.8  CL 106 106  CO2 21* 21*  GLUCOSE 89 86  BUN 21 18  CREATININE 1.46* 1.60*  CALCIUM 8.4* 8.2*     Intake/Output Summary (Last 24 hours) at 12/28/2022 0911 Last data filed at 12/28/2022 0834 Gross per 24 hour  Intake 360 ml  Output 1150 ml  Net -790 ml        Physical Exam: Vital Signs Blood pressure 129/89, pulse 77, temperature 98.9 F (37.2 C), temperature source Oral, resp. rate 18, height 5\' 7"  (1.702 m), weight 67.4 kg, SpO2 96%.   General: No acute distress Mood and affect are appropriate Heart: Regular rate and rhythm no rubs murmurs or extra sounds Lungs: Clear to auscultation, breathing unlabored, no rales or wheezes Abdomen: Positive bowel sounds, soft nontender to palpation, nondistended Extremities: No clubbing, cyanosis, or edema Skin: No evidence of breakdown, no evidence of rash   Skin: No evidence of breakdown, no evidence of rash Neurologic: Cranial nerves II through XII intact, motor strength is 5/5 in right 4/5 left deltoid, bicep, tricep, grip, hip flexor, knee extensors, ankle dorsiflexor and plantar flexor  Left homonymous hemianopsia but turns head to left to compensate Oriented to hospital but says in [place], not oriented to month or year-he says I do not know Musculoskeletal: Full range of motion in all 4 extremities. No joint swelling    Assessment/Plan: 1. Functional deficits which require 3+ hours per day of interdisciplinary therapy in  a comprehensive inpatient rehab setting. Physiatrist is providing close team supervision and 24 hour management of active medical problems listed below. Physiatrist and rehab team continue to assess barriers to discharge/monitor patient progress toward functional and medical goals  Care Tool:  Bathing    Body parts bathed by patient: Right arm, Left arm, Abdomen, Chest, Buttocks, Front perineal area, Left upper leg, Right upper leg, Right lower leg, Left lower leg, Face         Bathing assist Assist Level: Minimal Assistance - Patient > 75%     Upper Body Dressing/Undressing Upper body dressing   What is the patient wearing?: Pull over shirt    Upper body assist Assist Level: Minimal Assistance - Patient > 75%    Lower Body Dressing/Undressing Lower body dressing      What is the patient wearing?: Underwear/pull up, Pants     Lower body assist Assist for lower body dressing: Minimal Assistance - Patient > 75%     Toileting Toileting    Toileting assist Assist for toileting: Contact Guard/Touching assist     Transfers Chair/bed transfer  Transfers assist     Chair/bed transfer assist level: Minimal Assistance -  Patient > 75% Chair/bed transfer assistive device:  (none)   Locomotion Ambulation   Ambulation assist      Assist level: Minimal Assistance - Patient > 75% Assistive device: No Device Max distance: 200+   Walk 10 feet activity   Assist     Assist level: Minimal Assistance - Patient > 75% Assistive device: No Device   Walk 50 feet activity   Assist    Assist level: Minimal Assistance - Patient > 75% Assistive device: No Device    Walk 150 feet activity   Assist    Assist level: Minimal Assistance - Patient > 75% Assistive device: No Device    Walk 10 feet on uneven surface  activity   Assist Walk 10 feet on uneven surfaces activity did not occur: Safety/medical concerns         Wheelchair     Assist Is the  patient using a wheelchair?: No Type of Wheelchair: Manual    Wheelchair assist level: Dependent - Patient 0% Max wheelchair distance: 300 ft    Wheelchair 50 feet with 2 turns activity    Assist        Assist Level: Dependent - Patient 0%   Wheelchair 150 feet activity     Assist      Assist Level: Dependent - Patient 0%   Blood pressure 129/89, pulse 77, temperature 98.9 F (37.2 C), temperature source Oral, resp. rate 18, height 5\' 7"  (1.702 m), weight 67.4 kg, SpO2 96%.  Medical Problem List and Plan: 1. Functional deficits secondary to R PCA thrombotic CVA, cognitive deficits, poor balance and Left homonymous hemianopsia - may have greater involvement of left lower quadrant             -patient may shower             -ELOS/Goals: 8/29, SPV PT/OT/SLP- HH therapy post discharge   -Continue CIR therapies including PT, OT, and SLP    -Est discharge 8/29  2.  Antithrombotics: -DVT/anticoagulation:  Pharmaceutical: Lovenox             -antiplatelet therapy: Plavix  3. Pain Management:  Tylenol prn.   - 0 pain reported on nursing assessments; monitor  4. Mood/Behavior/Sleep/at bedtime aggitation:  LCSW to follow for evaluation and support.              -antipsychotic agents: N/A             -Zyprexa for agitation , has mostly had confusion rather than agitation      5. Neuropsych/cognition: This patient is not capable of making decisions on his own behalf.              - Delirium precautions, telesitter and bed alarm, utilize aides for orientation (hearing aides, glasses)  -8/26 Discussed deliirium with wife, she plans to talk more about behavioral strategies with SLP today  6. Skin/Wound Care: Routine pressure relief measures.  7. Fluids/Electrolytes/Nutrition: Monitor I/O. Check CMET in am. Baseline OP Cr appears 1.1-1.2.   - 8/19: BUN down, Cr up slightly; Encourage Po fluids, monitor I/Os, recheck in AM Intake 700-834mL per day for last 2 days     Latest  Ref Rng & Units 12/28/2022    7:36 AM 12/26/2022    5:28 AM 12/20/2022    6:04 AM  BMP  Glucose 70 - 99 mg/dL 86  89  98   BUN 8 - 23 mg/dL 18  21  26    Creatinine 0.61 - 1.24 mg/dL  1.60  1.46  1.40   Sodium 135 - 145 mmol/L 135  135  137   Potassium 3.5 - 5.1 mmol/L 3.8  3.5  3.9   Chloride 98 - 111 mmol/L 106  106  103   CO2 22 - 32 mmol/L 21  21  24    Calcium 8.9 - 10.3 mg/dL 8.2  8.4  8.7    0/86- liquid intake not great (only 480cc yesterday)--check labs tomorrow   -encourage PO  8/26 Will start IVF, CR up to 1.48  8/28  Creat up to 1.6 IVF today , recheck in am , ECHO with nl EJ fx , increase IVF to 75mL per hr  8. HTN: Monitor BP TID--not on any meds PTA.  - 8/27 well-controlled, continue to monitor Vitals:   12/27/22 1931 12/28/22 0607  BP: (!) 140/90 129/89  Pulse: 76 77  Resp: 16 18  Temp: 98.8 F (37.1 C) 98.9 F (37.2 C)  SpO2: 98% 96%    9. H/o GIB X 2: Will resume Protonix and monitor stool for occult blood.             --monitor H/H   - HgB stable, LBM 8/18 large nonbloody; monitor  -8/26 HGB stable at 13.9    Latest Ref Rng & Units 12/26/2022    5:28 AM 12/19/2022    5:56 AM 12/16/2022    6:15 AM  CBC  WBC 4.0 - 10.5 K/uL 8.4  9.6  8.3   Hemoglobin 13.0 - 17.0 g/dL 57.8  46.9  62.9   Hematocrit 39.0 - 52.0 % 41.5  42.8  44.0   Platelets 150 - 400 K/uL 280  304  281     10. Anxiety d/o: Continue Buspar bid--mood calm at present    11. Cardiac monitoring: Has Zio patch/event monitor. Wife with multiple questions BM:WUXL,KGM, care.              Dr Juliann Pares is cardiologist  -Denies Chest pain  12.  Insomnia  -Currently on 75 mg Seroquel nightly, has trazodone as needed  -8/27 Will start melatonin, his wife reports he has used this in the past and tolerated  Drowsy this am , will reduce quetiapine and change to prn , also d/c zyprexa (not using ) , d/c Trazodone (not using)   LOS: 13 days A FACE TO FACE EVALUATION WAS PERFORMED  Erick Colace 12/28/2022, 9:11 AM

## 2022-12-28 NOTE — Progress Notes (Signed)
Patient ID: Clayton [name], male   DOB: 1934/06/18, 88 y.o.   MRN: [id number]  This SW covering for primary SW, Lavera Guise.   SW met with pt, pt wife, and pt dtr in room to provide updates from team conference, and d/c date remains tomorrow pending medical clearance. SW discussed discharge tomorrow. She confirms she took shower chair home yesterday. Wife requests an aide to assist with person care, and sitter list for future assistance. She also inquired about applying for SSDI for pt. SW will provide [employer].   SW submitted order and request for aide to [facility], SSA kit, and private sitter list.   Cecile Sheerer, MSW, LCSWA Office: [phone] Cell: [phone] Fax: [phone]

## 2022-12-28 NOTE — Progress Notes (Signed)
Occupational Therapy Discharge Summary  Patient Details  Name: Clayton [name] MRN: [id number] Date of Birth: Mar 03, 1935  Date of Discharge from OT service:December 28, 2022  Today's Date: 12/28/2022 OT Individual Time: 1101-1210 OT Individual Time Calculation (min): 69 min    Patient has met 7 of 11 long term goals due to improved activity tolerance, improved balance, postural control, ability to compensate for deficits, functional use of  LEFT upper and LEFT lower extremity, improved attention, improved awareness, and improved coordination.  Patient to discharge at overall Supervision-CGA level.  Patient's care partner is independent to provide the necessary physical and cognitive assistance at discharge.    Reasons goals not met: Pt continues to require supervision for toilet transfers, toileting, LB dressing, and dynamic standing balance. See POC note for details.   Recommendation:  Patient will benefit from ongoing skilled OT services in home health setting to continue to advance functional skills in the area of BADL and Reduce care partner burden.  Equipment: Shower Chair  Reasons for discharge: treatment goals met and discharge from hospital  Patient/family agrees with progress made and goals achieved: Yes  OT Discharge Skilled Therapeutic Interventions/Progress Updates:  Pt received using restroom with nursing staff present in room. Pt presenting to be in wit flat affect, receptive to skilled OT session reporting 0/10 pain- OT offering intermittent rest breaks, repositioning, and therapeutic support to optimize participation in therapy session. Provided increased time for Pt on toilet d/t need for BM with pt able to perform 3/3 toileting tasks with supervision following with verbal cues required for attention to task and to ensure cleanliness. Pt able to donn clean underwear, pants, and weave belt with supervision. Pt's wife presenting to be upset upon OT arrival d/t recent  events with nursing staff with therapeutic support, encouragement, listening, and gentle education provided with improvement in moral noted. Focussed session on family education with wife with opportunity to provided hands-on assistance with transfers provided. Pt completed functional mobility to/from therapy gym this session close supervision for safety with mod verbal cues provided for attention to L foot placement, head positioning, and awareness of obstacles on L side. Re-educated Pt and Pt's wife on walk-in shower transfer and set-up environment to simulate how environment. Provided demonstration and opportunity to practice following. Pt able to safety complete transfer using RW with supervision from his wife with Pt's wife providing mod verbal cues for safety and technique x2 trials. Pt's wife reporting all questions were met at end of therapy session. Pt was left resting in wc with call bell in reach, seat belt alarm on, tele-sitter on, and all needs met.   Precautions/Restrictions  Precautions Precautions: Fall Precaution Comments: mild L inattention to surroundings   Pain Pain Assessment Pain Scale: 0-10 Pain Score: 0-No pain ADL ADL Eating: Supervision/safety Grooming: Supervision/safety, Modified independent Where Assessed-Grooming: Sitting at sink Upper Body Bathing: Supervision/safety Where Assessed-Upper Body Bathing: Shower Lower Body Bathing: Supervision/safety Where Assessed-Lower Body Bathing: Shower Upper Body Dressing: Modified independent (Device) Where Assessed-Upper Body Dressing: Edge of bed Lower Body Dressing: Supervision/safety Where Assessed-Lower Body Dressing: Edge of bed Toileting: Supervision/safety Where Assessed-Toileting: Teacher, adult education: Close supervision Toilet Transfer Method: Proofreader: Engineer, technical sales: Not assessed Film/video editor: Close supervision Film/video editor Method:  Designer, industrial/product: Information systems manager without back Vision Baseline Vision/History: 1 Wears glasses Patient Visual Report: No change from baseline Vision Assessment?: Yes Eye Alignment: Within Functional Limits Ocular Range of Motion: Within Functional Limits Alignment/Gaze Preference:  Within Defined Limits Tracking/Visual Pursuits: Decreased smoothness of horizontal tracking;Requires cues, head turns, or add eye shifts to track;Decreased smoothness of eye movement to RIGHT superior field (Pt unable to attend to stimulus in L visual field) Saccades: Decreased speed of saccadic movement;Additional eye shifts occurred during testing Convergence: Within functional limits Visual Fields: Left visual field deficit (potential L visual field deficits vs L side neglect) Perception  Perception: Impaired Praxis Praxis: WFL Cognition Cognition Overall Cognitive Status: Impaired/Different from baseline Arousal/Alertness: Awake/alert Orientation Level: Person;Place;Situation Person: Oriented Place: Disoriented Situation: Disoriented Memory: Impaired Memory Impairment: Storage deficit;Retrieval deficit;Decreased recall of new information;Decreased short term memory Decreased Short Term Memory: Verbal basic;Functional basic Attention: Focused;Sustained Focused Attention: Impaired Focused Attention Impairment: Verbal basic;Functional basic Sustained Attention: Impaired Sustained Attention Impairment: Verbal basic;Functional basic Awareness: Impaired Awareness Impairment: Intellectual impairment Problem Solving: Impaired Problem Solving Impairment: Verbal basic;Functional basic Executive Function: Self Correcting;Self Monitoring;Initiating Reasoning: Impaired Reasoning Impairment: Verbal basic;Functional basic Sequencing: Impaired Sequencing Impairment: Verbal basic;Functional basic Organizing: Impaired Organizing Impairment: Verbal basic;Functional basic Initiating: Appears  intact Self Monitoring: Impaired Self Monitoring Impairment: Verbal basic;Functional basic Self Correcting: Impaired Self Correcting Impairment: Verbal basic;Functional basic Behaviors: Impulsive;Restless Safety/Judgment: Impaired Brief Interview for Mental Status (BIMS) Repetition of Three Words (First Attempt): 3 Temporal Orientation: Year: Missed by more than 5 years Temporal Orientation: Month: Missed by more than 1 month Temporal Orientation: Day: Incorrect Recall: "Sock": Yes, after cueing ("something to wear") Recall: "Blue": Yes, after cueing ("a color") Recall: "Bed": Yes, after cueing ("a piece of furniture") BIMS Summary Score: 6 Sensation Sensation Light Touch: Impaired Detail Peripheral sensation comments: hx of B LE neuropathy below knees Light Touch Impaired Details: Impaired LLE (L UE sensory impairement vs innatention) Hot/Cold: Appears Intact Proprioception: Impaired by gross assessment Stereognosis: Not tested Coordination Gross Motor Movements are Fluid and Coordinated: No Fine Motor Movements are Fluid and Coordinated: No Coordination and Movement Description: decreased cooridnation in L U/LE, improvement from inital evaluation Finger Nose Finger Test: decreased smoothness and accuracy with L hand with dysmetria and undershooting Motor  Motor Motor: Hemiplegia Motor - Discharge Observations: decreased smoothness and accuracy in L UE, improvement from baseline Mobility  Bed Mobility Bed Mobility: Supine to Sit;Sit to Supine Supine to Sit: Independent with assistive device Sit to Supine: Independent with assistive device Transfers Sit to Stand: Independent with assistive device Stand to Sit: Independent with assistive device  Trunk/Postural Assessment  Cervical Assessment Cervical Assessment: Exceptions to [facility] (forward head) Thoracic Assessment Thoracic Assessment: Exceptions to [facility] (rounded shoulders) Lumbar Assessment Lumbar Assessment: Exceptions  to WFL (slight posterior pelvic tilt) Postural Control Postural Control: Deficits on evaluation Protective Responses: delayed  Balance Balance Balance Assessed: Yes Static Sitting Balance Static Sitting - Balance Support: Feet supported Static Sitting - Level of Assistance: 6: Modified independent (Device/Increase time) Dynamic Sitting Balance Dynamic Sitting - Balance Support: Feet supported;During functional activity Dynamic Sitting - Level of Assistance: 6: Modified independent (Device/Increase time) Dynamic Sitting - Balance Activities: Reaching for objects Static Standing Balance Static Standing - Balance Support: During functional activity Static Standing - Level of Assistance: 5: Stand by assistance Dynamic Standing Balance Dynamic Standing - Level of Assistance: 5: Stand by assistance Dynamic Standing - Balance Activities:  (ADLs) Extremity/Trunk Assessment RUE Assessment RUE Assessment: Within Functional Limits LUE Assessment LUE Assessment: Exceptions to [facility] Strength Comments: 4/5 overall, deficits more in coordination   [employer] 12/28/2022, 12:54 PM

## 2022-12-28 NOTE — Discharge Instructions (Addendum)
Inpatient Rehab Discharge Instructions  Clayton [name] Discharge date and time:    Activities/Precautions/ Functional Status: Activity: no lifting, driving, or strenuous exercise till cleared by Md Diet: Low fat/low cholesterol. Limit sweets and starches. Soft foods--no straws Wound Care: none needed   Functional status:  ___ No restrictions     ___ Walk up steps independently _X__ 24/7 supervision/assistance   ___ Walk up steps with assistance ___ Intermittent supervision/assistance  ___ Bathe/dress independently ___ Walk with walker     _X__ Bathe/dress with assistance ___ Walk Independently    ___ Shower independently ___ Walk with assistance    ___ Shower with assistance __X_ No alcohol     ___ Return to work/school ________   Special Instructions:     COMMUNITY REFERRALS UPON DISCHARGE:    Home Health:   PT      OT      ST        SNA                Agency:[facility]   [phone] *Please expect follow-up within 2-3 days to schedule your home visit. If you have not received follow-up, be sure to contact the site directly.*   Medical Equipment/Items Ordered:shower seat with back                                                 Agency/Supplier:[facility] [phone]     STROKE/TIA DISCHARGE INSTRUCTIONS SMOKING Cigarette smoking nearly doubles your risk of having a stroke & is the single most alterable risk factor  If you smoke or have smoked in the last 12 months, you are advised to quit smoking for your health. Most of the excess cardiovascular risk related to smoking disappears within a year of stopping. Ask you doctor about anti-smoking medications [place] Quit Line: 1-800-QUIT NOW Free Smoking Cessation Classes [phone]  CHOLESTEROL Know your levels; limit fat & cholesterol in your diet  Lipid Panel     Component Value Date/Time   CHOL 153 12/13/2022 0432   CHOL 189 11/30/2021 0934   TRIG 81 12/13/2022 0432   HDL 39 (L) 12/13/2022 0432    HDL 43 11/30/2021 0934   CHOLHDL 3.9 12/13/2022 0432   VLDL 16 12/13/2022 0432   LDLCALC 98 12/13/2022 0432   LDLCALC 120 (H) 11/30/2021 0934     Many patients benefit from treatment even if their cholesterol is at goal. Goal: Total Cholesterol (CHOL) less than 160 Goal:  Triglycerides (TRIG) less than 150 Goal:  HDL greater than 40 Goal:  LDL (LDLCALC) less than 100   BLOOD PRESSURE American Stroke Association blood pressure target is less that 120/80 mm/Hg  Your discharge blood pressure is:  BP: 129/89 Monitor your blood pressure Limit your salt and alcohol intake Many individuals will require more than one medication for high blood pressure  DIABETES (A1c is a blood sugar average for last 3 months) Goal HGBA1c is under 7% (HBGA1c is blood sugar average for last 3 months)  Diabetes: No known diagnosis of diabetes    Lab Results  Component Value Date   HGBA1C 6.1 (H) 12/12/2022    Your HGBA1c can be lowered with medications, healthy diet, and exercise. Check your blood sugar as directed by your physician Call your physician if you experience unexplained or low blood sugars.  PHYSICAL ACTIVITY/REHABILITATION Goal  is 30 minutes at least 4 days per week  Activity: No driving, Therapies: see above Return to work: N/A Activity decreases your risk of heart attack and stroke and makes your heart stronger.  It helps control your weight and blood pressure; helps you relax and can improve your mood. Participate in a regular exercise program. Talk with your doctor about the best form of exercise for you (dancing, walking, swimming, cycling).  DIET/WEIGHT Goal is to maintain a healthy weight  Your discharge diet is:  Diet Order             Diet Heart Room service appropriate? Yes with Assist; Fluid consistency: Thin  Diet effective now                   liquids Your height is:  Height: 5\' 7"  (170.2 cm) Your current weight is: Weight: 67.4 kg Your Body Mass Index (BMI) is:   BMI (Calculated): 23.27 Following the type of diet specifically designed for you will help prevent another stroke. You are at goal weight  Your goal Body Mass Index (BMI) is 19-24. Healthy food habits can help reduce 3 risk factors for stroke:  High cholesterol, hypertension, and excess weight.  RESOURCES Stroke/Support Group:  Call [phone]   STROKE EDUCATION PROVIDED/REVIEWED AND GIVEN TO PATIENT Stroke warning signs and symptoms How to activate emergency medical system (call 911). Medications prescribed at discharge. Need for follow-up after discharge. Personal risk factors for stroke. Pneumonia vaccine given:  Flu vaccine given:  My questions have been answered, the writing is legible, and I understand these instructions.  I will adhere to these goals & educational materials that have been provided to me after my discharge from the hospital.     My questions have been answered and I understand these instructions. I will adhere to these goals and the provided educational materials after my discharge from the hospital.  Patient/Caregiver Signature _______________________________ Date __________  Clinician Signature _______________________________________ Date __________  Please bring this form and your medication list with you to all your follow-up doctor's appointments.

## 2022-12-28 NOTE — Plan of Care (Signed)
  Problem: RH Balance Goal: LTG Patient will maintain dynamic standing with ADLs (OT) Description: LTG:  Patient will maintain dynamic standing balance with assist during activities of daily living (OT)  Outcome: Not Met (add Reason) Note: Pt requires supervision for safety for dynamic standing balance.    Problem: RH Dressing Goal: LTG Patient will perform lower body dressing w/assist (OT) Description: LTG: Patient will perform lower body dressing with assist, with/without cues in positioning using equipment (OT) Outcome: Not Met (add Reason) Note: Pt requires supervision for LB dressing to increase safety d/t cognitive deficits.    Problem: RH Toileting Goal: LTG Patient will perform toileting task (3/3 steps) with assistance level (OT) Description: LTG: Patient will perform toileting task (3/3 steps) with assistance level (OT)  Outcome: Not Met (add Reason) Note: Pt requires supervision with toileting tasks to increase safety and ensure cleanliness.   Problem: RH Toilet Transfers Goal: LTG Patient will perform toilet transfers w/assist (OT) Description: LTG: Patient will perform toilet transfers with assist, with/without cues using equipment (OT) Outcome: Not Met (add Reason) Note: Pt requires supervision for toilet transfers to increase safety.    Problem: Sit to Stand Goal: LTG:  Patient will perform sit to stand in prep for activites of daily living with assistance level (OT) Description: LTG:  Patient will perform sit to stand in prep for activites of daily living with assistance level (OT) Outcome: Completed/Met   Problem: RH Grooming Goal: LTG Patient will perform grooming w/assist,cues/equip (OT) Description: LTG: Patient will perform grooming with assist, with/without cues using equipment (OT) Outcome: Completed/Met   Problem: RH Bathing Goal: LTG Patient will bathe all body parts with assist levels (OT) Description: LTG: Patient will bathe all body parts with assist  levels (OT) Outcome: Completed/Met   Problem: RH Dressing Goal: LTG Patient will perform upper body dressing (OT) Description: LTG Patient will perform upper body dressing with assist, with/without cues (OT). Outcome: Completed/Met   Problem: RH Functional Use of Upper Extremity Goal: LTG Patient will use RT/LT upper extremity as a (OT) Description: LTG: Patient will use right/left upper extremity as a stabilizer/gross assist/diminished/nondominant/dominant level with assist, with/without cues during functional activity (OT) Outcome: Completed/Met   Problem: RH Tub/Shower Transfers Goal: LTG Patient will perform tub/shower transfers w/assist (OT) Description: LTG: Patient will perform tub/shower transfers with assist, with/without cues using equipment (OT) Outcome: Completed/Met   Problem: RH Attention Goal: LTG Patient will demonstrate this level of attention during functional activites (OT) Description: LTG:  Patient will demonstrate this level of attention during functional activites  (OT) Outcome: Completed/Met

## 2022-12-28 NOTE — Progress Notes (Signed)
Physical Therapy Session Note  Patient Details  Name: Clayton [name] MRN: [id number] Date of Birth: 02-15-1935  Today's Date: 12/28/2022 PT Individual Time: 1405-1507 PT Individual Time Calculation (min): 62 min   Short Term Goals: Week 2:  PT Short Term Goal 1 (Week 2): = to LTGs based on ELOS  Skilled Therapeutic Interventions/Progress Updates:    Pt received sitting in w/c with his wife, [name], and daughter, Elnita Maxwell, present and pt agreeable to therapy session despite appearing fatigue. Pt's wife denies any questions/concerns from education/training yesterday. She is present throughout session for continued observational and hands-on training as needed. Sit<>stands using RW mod-I throughout session. Gait training ~167ft to main therapy gym using RW with close supervision on his L side for safety - pt demos slow gait speed with decreased foot clearance bilaterally (L worse than R), mild forward trunk lean, and slight shuffled gait pattern - overall pt walking with decreased gait speed and appearing to not feel well yesterday and today, MD made aware of this during team conf this morning. Pt's wife reports no questions regarding the floor transfer training performed yesterday. Pt continues to have severe memory deficits and does not recall this therapist nor any interventions/education performed with this therapist therefore will require continued step-by-step cuing for sequencing mobility tasks at D/C from his wife. Stair navigation training ascending/descending 8 steps using L HR only to simulate home set-up with CGA/close supervision performing reciprocal stepping pattern and relying on HR for balance support. Gait training to ortho gym as described above. Performed simulated ambulatory car transfer (small SUV height) using RW with therapist providing visual demonstration of proper technique/sequencing and pt replicating with supervision. Gait training up/down ramp using RW with close  supervision/CGA for safety. Pt ambulated ~32ft back to his room using RW with close SBA/CGA from his wife for safety. Pt ambulated in/out bathroom using RW with his wife providing proper SBA/CGA for safety while pt performed standing LB clothing management and continent of bladder. Therapist reinforcing education on proper AD management at sink. Pt's wife reports he has eaten hardly any of his meals today - provided him with yogurt and he ate ~1/3 of it. Therapist educated pt's wife on company called "smart caregiver alert systems" to help her keep him safe at home. At end of session, pt left supine in bed with needs in reach, bed alarm on, and his wife exiting soon.  Therapy Documentation Precautions:  Precautions Precautions: Fall Precaution Comments: mild L inattention to surroundings Restrictions Weight Bearing Restrictions: No   Pain: Denies pain during session.   Therapy/Group: Individual Therapy  Ginny Forth , PT, DPT, NCS, CSRS 12/28/2022, 12:42 PM

## 2022-12-28 NOTE — Discharge Summary (Signed)
Physical Therapy Discharge Summary  Patient Details  Name: Clayton [name] MRN: [id number] Date of Birth: 1935-04-10  Date of Discharge from PT service:December 28, 2022  Today's Date: 12/28/2022 PT Individual Time: 1696-7893 PT Individual Time Calculation (min): 39 min    Patient has met 10 of 10 long term goals due to improved activity tolerance, improved balance, improved postural control, increased strength, ability to compensate for deficits, functional use of  left upper extremity and left lower extremity, improved attention, and improved awareness.  Patient to discharge at an ambulatory level Supervision/CGA using RW. Patient's care partner, wife [name], attended hands-on education/training and is independent to provide the necessary physical and cognitive assistance at discharge.  All goals met.  Recommendation:  Patient will benefit from ongoing skilled PT services in home health setting to continue to advance safe functional mobility, address ongoing impairments in L attention/visual scanning, dynamic standing balance, dynamic gait training using LRAD, stair navigation for home entry/exit, and minimize fall risk.  Equipment: No equipment provided, pt has RW  Reasons for discharge: treatment goals met and discharge from hospital  Patient/family agrees with progress made and goals achieved: Yes  Skilled Therapeutic Interventions/Progress Updates:  Pt received supine in bed and agreeable to therapy session. Pt reports he doesn't fully recall who this therapist is and doesn't recall our session from earlier today but "vaguely" recalls the car simulator. Supine>sitting L EOB, HOB flat and not using bedrail, mod-I. Sitting EOB donned shoes set-up assist. Sit<>stands using RW mod-I during session. Notified nursing staff and planned to go outside for this therapy session for change of scenery to increase pt moral due to pt being more fatigued and appearing to have a down mood yesterday  and today. Gait training ~362ft x2 to/from outside using RW with close supervision on his L side for balance including navigating on/off elevator - focused on L visual scanning/attention to locate elevators, elevator buttons, doorways, etc to safely navigate environment with mod/max cuing. While outside focus on L attention and reorientation of pt to location and situation - ~5-74minutes later pt able to recall he had a CVA after incorrectly guessing he had had a heart attack. At end of session, pt left supported upright in bed to prepare for meal arrival with needs in reach on his R side and bed alarm on.  PT Discharge Precautions/Restrictions Precautions Precautions: Fall;Other (comment) Precaution Comments: mild L inattention to surroundings Restrictions Weight Bearing Restrictions: No Pain Pain Assessment Pain Scale: 0-10 Pain Score: 0-No pain Pain Interference Pain Interference Pain Effect on Sleep: 8. Unable to answer (pt unable to recall due to memory) Pain Interference with Therapy Activities: 8. Unable to answer Pain Interference with Day-to-Day Activities: 8. Unable to answer Vision/Perception  Vision - History Ability to See in Adequate Light: 1 Impaired Vision - Assessment Eye Alignment: Within Functional Limits Ocular Range of Motion: Within Functional Limits Tracking/Visual Pursuits: Pt unable to attend to stimulus in L visual field Perception Perception: Impaired Preception Impairment Details: Inattention/Neglect (L neglect with some improvement from initial eval) Praxis Praxis: Impaired Praxis Impairment Details: Motor planning (impacted by L neglect/inattention)  Cognition Overall Cognitive Status: Impaired/Different from baseline Arousal/Alertness: Awake/alert Orientation Level: Oriented to person;Oriented to place;Disoriented to time;Disoriented to situation (oriented to being "in a hospital") Day of Week: Incorrect Attention: Focused;Sustained Focused  Attention: Impaired Sustained Attention: Impaired Memory: Impaired Memory Impairment: Storage deficit;Retrieval deficit;Decreased recall of new information;Decreased short term memory Decreased Short Term Memory: Verbal basic;Functional basic Awareness: Impaired Problem Solving: Impaired Behaviors:  Impulsive;Restless Safety/Judgment: Impaired Sensation Sensation Light Touch: Impaired Detail Peripheral sensation comments: hx of B LE neuropathy below knees but able to feel touch on screen bilaterally Hot/Cold: Not tested Proprioception: Impaired by gross assessment Stereognosis: Not tested Coordination Gross Motor Movements are Fluid and Coordinated: No Fine Motor Movements are Fluid and Coordinated: No Coordination and Movement Description: decreased coordination in L U/LE; however, improved significantly from inital evaluation Motor  Motor Motor: Hemiplegia Motor - Discharge Observations: decreased smoothness and accuracy in L UE, improvement from initial eval; improvement in L LE strength since initial eval  Mobility Bed Mobility Bed Mobility: Supine to Sit;Sit to Supine Supine to Sit: Independent with assistive device Sit to Supine: Independent with assistive device Transfers Transfers: Stand to Sit;Sit to Stand;Stand Pivot Transfers Sit to Stand: Independent with assistive device Stand to Sit: Independent with assistive device Stand Pivot Transfers: Supervision/Verbal cueing Stand Pivot Transfer Details: Verbal cues for safe use of DME/AE;Verbal cues for technique;Verbal cues for precautions/safety;Verbal cues for sequencing Transfer (Assistive device): Rolling walker Locomotion  Gait Ambulation: Yes Gait Assistance: Supervision/Verbal cueing Gait Distance (Feet): 300 Feet Assistive device: Rolling walker Gait Assistance Details: Verbal cues for safe use of DME/AE;Verbal cues for technique;Verbal cues for sequencing;Verbal cues for gait pattern Gait Gait: Yes Gait  Pattern: Impaired Gait Pattern: Poor foot clearance - left;Poor foot clearance - right;Shuffle;Decreased stride length;Decreased hip/knee flexion - left (decreased L LE compared to R LE that worsens with fatigue) Gait velocity: decreased Stairs / Additional Locomotion Stairs: Yes Stairs Assistance: Supervision/Verbal cueing;Contact Guard/Touching assist Stair Management Technique: One rail Left;Alternating pattern;Forwards Number of Stairs: 8 Height of Stairs: 6 Ramp: Supervision/Verbal cueing (using RW) Curb: Contact Guard/Touching assist Pick up small object from the floor assist level: Supervision/Verbal cueing Pick up small object from the floor assistive device: RW Wheelchair Mobility Wheelchair Mobility: No  Trunk/Postural Assessment  Cervical Assessment Cervical Assessment: Exceptions to [facility] (mild forward head) Thoracic Assessment Thoracic Assessment: Exceptions to [facility] (mild rounded shoulders) Lumbar Assessment Lumbar Assessment: Exceptions to WFL (slight posterior pelvic tilt) Postural Control Postural Control: Deficits on evaluation Protective Responses: delayed  Balance Balance Balance Assessed: Yes Static Sitting Balance Static Sitting - Balance Support: Feet supported Static Sitting - Level of Assistance: 6: Modified independent (Device/Increase time) Dynamic Sitting Balance Dynamic Sitting - Balance Support: Feet supported;During functional activity Dynamic Sitting - Level of Assistance: 6: Modified independent (Device/Increase time) Static Standing Balance Static Standing - Balance Support: During functional activity;Bilateral upper extremity supported Static Standing - Level of Assistance: 5: Stand by assistance Dynamic Standing Balance Dynamic Standing - Balance Support: During functional activity;Bilateral upper extremity supported Dynamic Standing - Level of Assistance: 5: Stand by assistance Extremity Assessment  RLE Assessment RLE Assessment: Exceptions  to [facility] Active Range of Motion (AROM) Comments: WFL/WNL General Strength Comments: assessed in sitting RLE Strength Right Hip Flexion: 4+/5 Right Knee Flexion: 4+/5 Right Knee Extension: 4+/5 Right Ankle Dorsiflexion: 4+/5 Right Ankle Plantar Flexion: 4+/5 LLE Assessment LLE Assessment: Exceptions to [facility] Active Range of Motion (AROM) Comments: WFL/WNL General Strength Comments: assessed in sitting LLE Strength Left Hip Flexion: 4+/5 Left Knee Flexion: 4+/5 Left Knee Extension: 4+/5 Left Ankle Dorsiflexion: 4+/5 Left Ankle Plantar Flexion: 4+/5   Ginny Forth , PT, DPT, NCS, CSRS 12/28/2022, 2:38 PM

## 2022-12-28 NOTE — Progress Notes (Signed)
Speech Language Pathology Discharge Summary  Patient Details  Name: Clayton [name] MRN: [id number] Date of Birth: [birth year]  Date of Discharge from SLP service:December 28, 2022  Today's Date: 12/28/2022 SLP Individual Time: 0800-0900 SLP Individual Time Calculation (min): 60 min   Skilled Therapeutic Interventions:   Skilled therapy session focused on cognitive and communicative goals. SLP addressed communication through use of respiratory training and diaphragmatic breathing exercises. Patient required re-education regarding all breathing exercises, however was then able to execute them well. SLP addressed cognition through education regarding L attention and completion of orientation tasks. Patient was not able to orient to time/location or situation this date using external aids. SLP provided handout with information about L inattention/neglect and ways to alter home environment to aid in reducing neglect. Problem solving was addressed through identification of safety risk in picture task. Patient was able to identify problem/solution with minA, however required emphasis on importance of using walker post d/c. Patient was left in bed with call bell in reach, alarm on and wife present.   Patient has met 2 of 6 long term goals.  Patient to discharge at overall Supervision;Mod level.  Reasons goals not met: severe memory deficits resulting in poor carry over of ST intervention   Clinical Impression/Discharge Summary:  Patient has made fair gains and has met 2/6 LTG's this admission due to improved speech intelligibility and swallowing safety. Pt is currently an overall mod-maxA during cognitive tasks due to severe memory deficits and subsequent dementia diagnosis. Patient benefits from supervision A to utilize swallowing strategies to minimize overt s/sx of aspiration with regular/thin diet (no straws). Patient able to utilize speech intelligibility strategies with modI A, however requires  increased support to complete respiratory strength exercises due to poor cognition. Pt/family education complete and pt will d/c home with wife and 24 hour supervision. Recommend f/u [facility] SLP services to continue to address goals.   Care Partner:  Caregiver Able to Provide Assistance: Yes  Type of Caregiver Assistance: Cognitive;Physical  Recommendation:  Home Health SLP  Rationale for SLP Follow Up: Maximize cognitive function and independence   Equipment: n/a   Reasons for discharge: Discharged from hospital   Patient/Family Agrees with Progress Made and Goals Achieved: Yes   [name] M.A., CF-SLP 12/28/2022, 12:22 PM

## 2022-12-28 NOTE — Patient Care Conference (Signed)
Inpatient RehabilitationTeam Conference and Plan of Care Update Date: 12/28/2022   Time: 4:04 PM    Patient Name: Clayton [name]      Medical Record Number: [id number]  Date of Birth: 1935/02/16 Sex: Male         Room/Bed: 4W25C/4W25C-01 Payor Info: Payor: [employer] MEDICARE / Plan: [employer] MEDICARE CHOICE PPO / Product Type: *No Product type* /    Admit Date/Time:  12/15/2022 12:55 PM  Primary Diagnosis:  Stroke (cerebrum) [facility])  Hospital Problems: Principal Problem:   Stroke (cerebrum) [facility])    Expected Discharge Date: Expected Discharge Date: 12/29/22  Team Members Present: Physician leading conference: Dr. Claudette Laws Social Worker Present: Cecile Sheerer, LCSWA Nurse Present: Chana Bode, RN PT Present: Casimiro Needle, PT OT Present: Bonnell Public, OT SLP Present: Everardo Pacific, SLP     Current Status/Progress Goal Weekly Team Focus  Bowel/Bladder   Patient uses urinal when it is easily avaiable   encourage patient to recognize the urinal and use it indepenently.   Pateint usually tries to get up when he has to urinate setting off bed and chair alarms.  Maybe work on recognizing the urinal and reminding him to use it.    Swallow/Nutrition/ Hydration   reg/thin no straws   supervisionA  continue to educate    ADL's   mod I UB dressing, supervision LB dressing, supervision bathing/toileitng, supervision to CGA for toilet and shower transfers   mod I-superivsion   Focus: fmaily education, safety awareness, l side attention, visual scanning, functional cognition, RW managment during transfers, dyanmic standing balance, activity tolerance; Barriers- fucntional cognition, pt's wife continues to dmeonstratate anxiety with taking Pt home, however improved from last week    Mobility   mod-I bed mobility, supervision sit<>stands and stand pivots using RW, supervision/CGA gait up to 212ft using RW, supervision stair navigation using HR per home set-up -- continues  with significant L inattention and severe memory deficits   mod-I/supervision overall at ambulatory level  dynamic standing balance, dynamic gait training, L attention, stair navigation, safety awareness - performed family education with wife, [name], on 8/27    Communication   minA to utilize strategies and complete respiratory strength exercises, wife reports reduced vocal quality   modI   education concerning use of EMST    Safety/Cognition/ Behavioral Observations  requires calendar/external aids for orientation, significant difficulties in carry over of strategies and recall of prior therapy actvities   minA   continue to utilize memory notebook and reinforce strategie specifically L attention    Pain   Patient uses 1-10 pain scale but accuracy is questionable.  Use in combination with FACES or PAINAD to ensure pain control.   Patient to remain pain free.   Practice rating pain.    Skin   In general patient's skin is fairly healthy.  He has a skin tear on his elbow   Remain free of skin breakdown  Increase nutriton and promote heygeine and movement.      Discharge Planning:  Pt will d/c to home with wife who will provide 24/7 care. [facility]- [facility] for HHPT/OT/SLP. DME- shower chair. SW will confirm there are no barriers to discharge.   Team Discussion: Patient  with poor po intake, decreased left side awareness, fatigue. MD weaning Seroquel. Limited by poor awareness, safety and judgement.  Patient on target to meet rehab goals: yes, currently needs supervision for lower body care and mod I for upper body bathing and dressing, Needs CGA for ambulating using  RW and supervision for transfers.   *See Care Plan and progress notes for long and short-term goals.   Revisions to Treatment Plan:  RMS/EMST Use of external aides/calendars/posters, and awareness exercises  Teaching Needs: Safety, medications, transfers, toileting, etc.   Current Barriers to Discharge: Decreased  caregiver support  Possible Resolutions to Barriers: Family education HH follow up services DME: shower seat     Medical Summary Current Status: confused Left HH, Zio patch to finish in am , reduced po intake, some am drowsiness  Barriers to Discharge: Medical stability   Possible Resolutions to [employer] Focus: Cont d/c planning   Continued Need for Acute Rehabilitation Level of Care: The patient requires daily medical management by a physician with specialized training in physical medicine and rehabilitation for the following reasons: Direction of a multidisciplinary physical rehabilitation program to maximize functional independence : Yes Medical management of patient stability for increased activity during participation in an intensive rehabilitation regime.: Yes Analysis of laboratory values and/or radiology reports with any subsequent need for medication adjustment and/or medical intervention. : Yes   I attest that I was present, lead the team conference, and concur with the assessment and plan of the team.   Chana Bode B 12/28/2022, 4:04 PM

## 2022-12-28 NOTE — Discharge Summary (Signed)
Physician Discharge Summary  Patient ID: Clayton [name] MRN: [id number] DOB/AGE: Sep 25, 1934 88 y.o.  Admit date: 12/15/2022 Discharge date: 12/29/2022  Discharge Diagnoses:  Principal Problem:   Stroke (cerebrum) [facility]) Active Problems:   Cognitive impairment   Insomnia due to medical condition   Elevated low density lipoprotein (LDL) cholesterol level   Generalized anxiety disorder   GERD (gastroesophageal reflux disease)   ARF (acute renal failure) (HCC)   Discharged Condition: stable  Significant Diagnostic Studies: DG Swallowing Func-Speech Pathology  Result Date: 12/19/2022 Table formatting from the original result was not included. Modified Barium Swallow Study Patient Details Name: Clayton [name] MRN: [id number] Date of Birth: 19-Apr-1935 Today's Date: 12/19/2022 HPI/PMH: HPI: Clayton [name] is a 88 y.o. male with medical history significant of cognitive impairment, history of TBI, hypertension presenting with weakness, confusion.  Noted admission February 2024 with multifocal pneumonia that resulted in worsening cognitive impairment per the wife.  He was admitted to [facility] 12/12/22 for stroke work up. Pt. Presented to the ER afebrile, hemodynamically stable.  White count 7, hemoglobin 14.4, platelets 299, COVID flu RSV negative, ammonia level within normal limits, urinalysis not indicative of infection.  CT head within normal limits .  MRI of the brain showed scattered small acute to subacute infarcts in the right PCA distribution.  LDL 98, HDL 39.  A1c 6.1.  Patient is treated with atorvastatin, Plavix.  He previous had reaction to aspirin, could not tolerate.  Decision was made to treat with Plavix for long-term. Echocardiogram was also performed, showed normal ejection fraction without a source of thrombosis. Zio patch to be placed prior to d/c. Pt. Seen by PT,OT, SLP and they recommend CIR to assist return to PLOF. Clinical Impression: Clinical  Impression: Patient presents with mild oropharyngeal post-stroke dysphagia primary characterized by slowed oral preparation of solids and reduced pharyngeal efficiency resulting in trace-mild diffuse pharyngeal residue across trials with residue increasing in amount conversely with decrease of bolus viscosity. Patient also exhibited 1x of swallow mistiming with thin liquids on first swallow of the study resulting in trace audible aspiration which was not cleared from the airway despite attempts. During this attempt to clear the airway, patient exhibited a weakened cough response which contributes to potential risk for aspiration related sequelae. During further comprehensive challenging, no aspiration observed with thin liquids beyond the aforementioned. With thin liquids from straw, SLP observed trace transient penetration above the level of the vocal folds which cleared from the vestibule with completion of the swallow. Patient exhibits mildly reduced BOT retraction and pharyngeal stripping wave resulting in trace-mild diffuse residue of thin liquids coating pharyngeal structures. Residue did not penetrate/aspirate. Recommend continuation of regular/thin liquid diet with the following modifications: NO STRAWS, single sips only, medications administered whole in applesauce. Patient would benefit from intermittent supervision to ensure utilization of above mentioned swallow safety strategies. As patient presents with mild risk for aspiration, SLP will monitor closely for clinical symptoms of aspiration with thin liquids. Patient would benefit from dysphagia exercise program to improve pharyngeal efficiency and strength of cough response to reduce risk of aspiration and related sequelae. SLP will continue to follow per POC. Factors that may increase risk of adverse event in presence of aspiration Clayton [name]): Factors that may increase risk of adverse event in presence of aspiration [employer]  [name]): Weak cough Recommendations/Plan: Swallowing Evaluation Recommendations Swallowing Evaluation Recommendations Recommendations: PO diet PO Diet Recommendation: Regular; Thin liquids (Level 0) Liquid Administration via:  Spoon; Cup Medication Administration: Whole meds with puree Supervision: Intermittent supervision/cueing for swallowing strategies; Patient able to self-feed Swallowing strategies  : Slow rate; Small bites/sips Postural changes: Position pt fully upright for meals; Stay upright 30-60 min after meals Oral care recommendations: Oral care BID (2x/day) Treatment Plan Treatment Plan Treatment recommendations: Therapy as outlined in treatment plan below Follow-up recommendations: Home health SLP Functional status assessment: Patient has had a recent decline in their functional status and demonstrates the ability to make significant improvements in function in a reasonable and predictable amount of time. Treatment frequency: Min 3x/week Treatment duration: 1 week Interventions: Aspiration precaution training; Oropharyngeal exercises; Patient/family education; Diet toleration management by SLP; Respiratory muscle strength training Recommendations Recommendations for follow up therapy are one component of a multi-disciplinary discharge planning process, led by the attending physician.  Recommendations may be updated based on patient status, additional functional criteria and insurance authorization. Assessment: Orofacial Exam: Orofacial Exam Oral Cavity: Oral Hygiene: WFL Oral Cavity - Dentition: Poor condition; Missing dentition Orofacial Anatomy: WFL Oral Motor/Sensory Function: WFL Anatomy: Anatomy: WFL Boluses Administered: Boluses Administered Boluses Administered: Thin liquids (Level 0); Mildly thick liquids (Level 2, nectar thick); Moderately thick liquids (Level 3, honey thick); Puree; Solid  Oral Impairment Domain: Oral Impairment Domain Lip Closure: No labial escape Tongue control during bolus  hold: Cohesive bolus between tongue to palatal seal Bolus preparation/mastication: Slow prolonged chewing/mashing with complete recollection Bolus transport/lingual motion: Brisk tongue motion Oral residue: Trace residue lining oral structures Location of oral residue : Tongue; Palate Initiation of pharyngeal swallow : Valleculae  Pharyngeal Impairment Domain: Pharyngeal Impairment Domain Soft palate elevation: No bolus between soft palate (SP)/pharyngeal wall (PW) Laryngeal elevation: Complete superior movement of thyroid cartilage with complete approximation of arytenoids to epiglottic petiole Anterior hyoid excursion: Complete anterior movement Epiglottic movement: Complete inversion Laryngeal vestibule closure: Complete, no air/contrast in laryngeal vestibule Pharyngeal stripping wave : Present - diminished Pharyngeal contraction (A/P view only): N/A Pharyngoesophageal segment opening: Partial distention/partial duration, partial obstruction of flow (With large boluses of thin only) Tongue base retraction: Trace column of contrast or air between tongue base and PPW Pharyngeal residue: Collection of residue within or on pharyngeal structures Location of pharyngeal residue: Valleculae; Pyriform sinuses; Pharyngeal wall  Esophageal Impairment Domain: Esophageal Impairment Domain Esophageal clearance upright position: Complete clearance, esophageal coating Pill: Pill Consistency administered: -- (not tested) Penetration/Aspiration Scale Score: Penetration/Aspiration Scale Score 1.  Material does not enter airway: Mildly thick liquids (Level 2, nectar thick); Moderately thick liquids (Level 3, honey thick); Puree; Solid 2.  Material enters airway, remains ABOVE vocal cords then ejected out: Thin liquids (Level 0) 7.  Material enters airway, passes BELOW cords and not ejected out despite cough attempt by patient: Thin liquids (Level 0) Compensatory Strategies: Compensatory Strategies Compensatory strategies: No    General Information: Caregiver present: No  Diet Prior to this Study: Regular; Thin liquids (Level 0)   Temperature : Normal   Respiratory Status: WFL   Supplemental O2: None (Room air)   History of Recent Intubation: No  Behavior/Cognition: Alert; Cooperative; Confused Self-Feeding Abilities: Able to self-feed Baseline vocal quality/speech: Normal Volitional Cough: Able to elicit Volitional Swallow: Able to elicit Exam Limitations: No limitations Goal Planning: Prognosis for improved oropharyngeal function: Fair Barriers to Reach Goals: Cognitive deficits No data recorded No data recorded Consulted and agree with results and recommendations: Pt unable/family or caregiver not available Pain: Pain Assessment Pain Assessment: No/denies pain End of Session: Start Time:No data recorded Stop Time: No  data recorded Time Calculation:No data recorded Charges: No data recorded SLP visit diagnosis: SLP Visit Diagnosis: Dysphagia, oropharyngeal phase (R13.12) Past Medical History: Past Medical History: Diagnosis Date  Acute gastrointestinal hemorrhage   Acute hypoxemic respiratory failure (HCC) 05/25/2022  Acute thrombotic stroke (HCC) 12/14/2022  Basal cell carcinoma   Blood in stool 08/30/2018  Blood transfusion without reported diagnosis 1 (age 89)  After traumatic brain injury  Broken ribs   2 BROKEN ON RIBS  CKD (chronic kidney disease)   PT DENIES  Color blindness   DVT (deep venous thrombosis) (HCC)   RIGHT LEG  Dyslexia   Fracture dislocation of wrist   RIGHT  GI bleed 09/02/2018  Hiatal hernia with GERD   HTN (hypertension)   Hypokalemia 05/26/2022  Hyponatremia 05/25/2022  Symptomatic anemia 09/03/2018  TBI (traumatic brain injury) (HCC)   w/skull Fx and crani in 1960s Past Surgical History: Past Surgical History: Procedure Laterality Date  COLONOSCOPY  2014  cleared for 5 yrs  COLONOSCOPY N/A 09/01/2018  Procedure: COLONOSCOPY;  Surgeon: Pasty Spillers, MD;  Location: [facility] ENDOSCOPY;  Service: Endoscopy;   Laterality: N/A;  CRANIECTOMY FOR DEPRESSED SKULL FRACTURE  1960'S  ESOPHAGOGASTRODUODENOSCOPY N/A 09/01/2018  Procedure: ESOPHAGOGASTRODUODENOSCOPY (EGD);  Surgeon: Pasty Spillers, MD;  Location[facility] ENDOSCOPY;  Service: Endoscopy;  Laterality: N/A;  HERNIA REPAIR    X2  IVC FILTER REMOVAL N/A 07/03/2017  Procedure: IVC FILTER REMOVAL;  Surgeon: Annice Needy, MD;  Location: [facility] INVASIVE CV LAB;  Service: Cardiovascular;  Laterality: N/A;  LUMBAR LAMINECTOMY/DECOMPRESSION MICRODISCECTOMY N/A 12/20/[name]  Procedure: L2/3, L3/4 LAMINECTOMY;  Surgeon: Lucy Chris, MD;  Location: [facility] ORS;  Service: Neurosurgery;  Laterality: N/A;  2ND CASE  PERIPHERAL VASCULAR THROMBECTOMY Right 05/22/2017  Procedure: PERIPHERAL VASCULAR THROMBECTOMY;  Surgeon: Annice Needy, MD;  Location: [facility] INVASIVE CV LAB;  Service: Cardiovascular;  Laterality: Right;  SPINE SURGERY  December [name]  TONSILLECTOMY   Jeannie Done, M.A., CCC-SLP Yetta Barre 12/19/2022, 10:28 AM  ECHOCARDIOGRAM COMPLETE  Result Date: 12/13/2022    ECHOCARDIOGRAM REPORT   Patient Name:   Clayton [name] Date of Exam: 12/13/2022 Medical Rec #:  [id number]           Height:       67.0 in Accession #:    [id number]          Weight:       180.0 lb Date of Birth:  11/06/1934           BSA:          1.934 m Patient Age:    87 years            BP:           139/73 mmHg Patient Gender: M                   HR:           76 bpm. Exam Location:  [facility] Procedure: 2D Echo, Cardiac Doppler and Color Doppler Indications:     TIA G45.9  History:         Patient has prior history of Echocardiogram examinations, most                  recent 06/03/2022. Risk Factors:Hypertension.  Sonographer:     Cristela Blue Referring Phys:  [id number] Francoise Schaumann [place] Diagnosing Phys: Yvonne Kendall MD IMPRESSIONS  1. Left ventricular ejection fraction, by estimation, is 55 to 60%. The left ventricle has normal  function. The left ventricle has no regional wall motion abnormalities. Left  ventricular diastolic parameters are consistent with Grade I diastolic dysfunction (impaired relaxation).  2. Right ventricular systolic function is normal. The right ventricular size is normal.  3. The mitral valve is degenerative. Mild mitral valve regurgitation. No evidence of mitral stenosis. There is mild late systolic prolapse of both leaflets of the mitral valve.  4. The aortic valve is tricuspid. There is mild calcification of the aortic valve. There is mild thickening of the aortic valve. Aortic valve regurgitation is mild. FINDINGS  Left Ventricle: Left ventricular ejection fraction, by estimation, is 55 to 60%. The left ventricle has normal function. The left ventricle has no regional wall motion abnormalities. The left ventricular internal cavity size was normal in size. There is  borderline left ventricular hypertrophy. Left ventricular diastolic parameters are consistent with Grade I diastolic dysfunction (impaired relaxation). Right Ventricle: The right ventricular size is normal. No increase in right ventricular wall thickness. Right ventricular systolic function is normal. Left Atrium: Left atrial size was normal in size. Right Atrium: Right atrial size was normal in size. Pericardium: The pericardium was not well visualized. Mitral Valve: The mitral valve is degenerative in appearance. There is mild late systolic prolapse of both leaflets of the mitral valve. There is mild thickening of the mitral valve leaflet(s). There is mild calcification of the mitral valve leaflet(s). Mild mitral valve regurgitation. No evidence of mitral valve stenosis. Tricuspid Valve: The tricuspid valve is normal in structure. Tricuspid valve regurgitation is trivial. Aortic Valve: The aortic valve is tricuspid. There is mild calcification of the aortic valve. There is mild thickening of the aortic valve. Aortic valve regurgitation is mild. Aortic regurgitation PHT measures 559 msec. Aortic valve mean gradient measures  3.0 mmHg. Aortic valve peak gradient measures 4.6 mmHg. Aortic valve area, by VTI measures 4.17 cm. Pulmonic Valve: The pulmonic valve was normal in structure. Pulmonic valve regurgitation is trivial. No evidence of pulmonic stenosis. Aorta: The aortic root and ascending aorta are structurally normal, with no evidence of dilitation. Pulmonary Artery: The pulmonary artery is of normal size. Venous: The inferior vena cava was not well visualized. IAS/Shunts: The interatrial septum was not well visualized.  LEFT VENTRICLE PLAX 2D LVIDd:         3.80 cm   Diastology LVIDs:         2.60 cm   LV e' medial:    6.42 cm/s LV PW:         1.10 cm   LV E/e' medial:  10.1 LV IVS:        1.00 cm   LV e' lateral:   8.59 cm/s LVOT diam:     2.20 cm   LV E/e' lateral: 7.6 LV SV:         82 LV SV Index:   42 LVOT Area:     3.80 cm  RIGHT VENTRICLE RV Basal diam:  2.60 cm RV Mid diam:    2.60 cm RV S prime:     12.30 cm/s TAPSE (M-mode): 1.9 cm LEFT ATRIUM             Index        RIGHT ATRIUM           Index LA diam:        2.30 cm 1.19 cm/m   RA Area:     11.80 cm LA Vol (A2C):   26.2 ml 13.55 ml/m  RA Volume:  23.70 ml  12.26 ml/m LA Vol (A4C):   45.3 ml 23.43 ml/m LA Biplane Vol: 35.4 ml 18.31 ml/m  AORTIC VALVE AV Area (Vmax):    3.55 cm AV Area (Vmean):   3.45 cm AV Area (VTI):     4.17 cm AV Vmax:           107.00 cm/s AV Vmean:          76.900 cm/s AV VTI:            0.197 m AV Peak Grad:      4.6 mmHg AV Mean Grad:      3.0 mmHg LVOT Vmax:         99.80 cm/s LVOT Vmean:        69.800 cm/s LVOT VTI:          0.216 m LVOT/AV VTI ratio: 1.10 AI PHT:            559 msec  AORTA Ao Root diam: 3.13 cm MITRAL VALVE               TRICUSPID VALVE MV Area (PHT): 3.79 cm    TR Peak grad:   19.0 mmHg MV Decel Time: 200 msec    TR Vmax:        218.00 cm/s MV E velocity: 65.00 cm/s MV A velocity: 81.20 cm/s  SHUNTS MV E/A ratio:  0.80        Systemic VTI:  0.22 m                            Systemic Diam: 2.20 cm Yvonne Kendall  MD Electronically signed by Yvonne Kendall MD Signature Date/Time: 12/13/2022/9:12:30 AM    Final    DG Hand 2 View Left  Result Date: 12/12/2022 CLINICAL DATA:  Trauma, fall, pain in left ring finger EXAM: LEFT HAND - 2 VIEW COMPARISON:  None Available. FINDINGS: No recent fracture or dislocation is seen. Degenerative changes are noted with bony spurs in first carpometacarpal joint and interphalangeal joint of thumb. There are possible tiny bony spurs in other interphalangeal and metacarpophalangeal joints. There is soft tissue swelling over the dorsum of the left wrist. IMPRESSION: No recent fracture or dislocation is seen. Degenerative changes are noted in multiple joints, more so in the first carpometacarpal joint. Electronically Signed   By: Ernie Avena M.D.   On: 12/12/2022 18:36   CT ANGIO HEAD NECK W WO CM  Result Date: 12/12/2022 CLINICAL DATA:  Stroke/TIA.  Determine embolic source. EXAM: CT ANGIOGRAPHY HEAD AND NECK WITH AND WITHOUT CONTRAST TECHNIQUE: Multidetector CT imaging of the head and neck was performed using the standard protocol during bolus administration of intravenous contrast. Multiplanar CT image reconstructions and MIPs were obtained to evaluate the vascular anatomy. Carotid stenosis measurements (when applicable) are obtained utilizing NASCET criteria, using the distal internal carotid diameter as the denominator. RADIATION DOSE REDUCTION: This exam was performed according to the departmental dose-optimization program which includes automated exposure control, adjustment of the mA and/or kV according to patient size and/or use of iterative reconstruction technique. CONTRAST:  75mL OMNIPAQUE IOHEXOL 350 MG/ML SOLN COMPARISON:  Head CT same day.  MRI same day. FINDINGS: CTA NECK FINDINGS Aortic arch: Aortic atherosclerosis. Branching pattern is normal. 30% estimated stenosis of the proximal left subclavian artery. Right carotid system: Common carotid artery widely patent to  the bifurcation. Soft and calcified plaque at the carotid bifurcation and ICA bulb but no  stenosis. Cervical ICA widely patent beyond that. Left carotid system: Common carotid artery widely patent to the bifurcation. Carotid bifurcation shows soft plaque but no stenosis. Cervical ICA widely patent beyond that. Vertebral arteries: Focal plaque at the right vertebral artery origin with 30-50% stenosis. As noted above, there is atherosclerotic change of the proximal left subclavian artery with stenosis estimated at 30%. Second focal stenosis of the subclavian artery just proximal to the vertebral artery origin with stenosis estimated at 30-50%. Focal calcified plaque at the proximal left vertebral artery with stenosis of 50%. Beyond that, the vessel is widely patent through the cervical region to the foramen magnum. Skeleton: Mild cervical spondylosis and facet arthritis. Other neck: No mass or lymphadenopathy. Upper chest: Mild scarring at the lung apices. Review of the MIP images confirms the above findings CTA HEAD FINDINGS Anterior circulation: Both internal carotid arteries are patent through the skull base and siphon regions. There is siphon atherosclerotic calcification but no stenosis. The anterior and middle cerebral vessels are patent. No large vessel occlusion or proximal stenosis. No aneurysm or vascular malformation. Posterior circulation: Both vertebral arteries are patent through the foramen magnum to the basilar artery. No basilar stenosis. There is abrupt occlusion of the right PCA in the P1 segment. More distal branch vessels show reconstituted flow. Venous sinuses: Patent and normal. Anatomic variants: None significant. Review of the MIP images confirms the above findings IMPRESSION: 1. Abrupt occlusion of the right PCA in the P1 segment. More distal branch vessels show reconstituted flow. 2. Aortic atherosclerosis. 3. Atherosclerotic disease at both carotid bifurcations but without stenosis. 4.  Atherosclerotic disease at both vertebral artery origins with 30-50% stenosis on the right and 50% stenosis on the left. 5. Atherosclerotic disease of the proximal left subclavian artery with stenosis estimated at 30%. Second focal stenosis of the subclavian artery just proximal to the vertebral artery origin with stenosis estimated at 30-50%. Aortic Atherosclerosis (ICD10-I70.0). Electronically Signed   By: Paulina Fusi M.D.   On: 12/12/2022 14:58   MR BRAIN WO CONTRAST  Result Date: 12/12/2022 CLINICAL DATA:  Mental status change with unknown cause EXAM: MRI HEAD WITHOUT CONTRAST TECHNIQUE: Multiplanar, multiecho pulse sequences of the brain and surrounding structures were obtained without intravenous contrast. COMPARISON:  07/13/2022 FINDINGS: Brain: Small areas of restricted diffusion in the right occipital cortex, right hippocampus, and right thalamus. Other than the occipital cortex, diffusion restriction is fairly weak. Symmetric diffusion hyperintensity along the periphery of the bilateral cerebellum is likely artifactual. Generalized cerebral volume loss. No acute hemorrhage, hydrocephalus, mass, or collection. Mild chronic small vessel ischemia in the cerebral white matter. Vascular: Major flow voids are preserved Skull and upper cervical spine: Normal marrow signal. Sinuses/Orbits: Negative. Other: Intermittent mild motion artifact. IMPRESSION: 1. Scattered small acute to subacute infarcts in the right PCA distribution 2. Generalized brain atrophy. Electronically Signed   By: Tiburcio Pea M.D.   On: 12/12/2022 14:58   DG Chest Port 1 View  Result Date: 12/12/2022 CLINICAL DATA:  [id number] Confusion [id number] EXAM: PORTABLE CHEST 1 VIEW COMPARISON:  Chest x-ray 06/07/2022, CT chest 06/07/2022 FINDINGS: The heart and mediastinal contours are unchanged. Small to moderate volume hiatal hernia overlying the mediastinum No focal consolidation. No pulmonary edema. No pleural effusion. No pneumothorax. No  acute osseous abnormality. IMPRESSION: 1. No active disease. 2. Small to moderate volume hiatal hernia. Electronically Signed   By: Tish Frederickson M.D.   On: 12/12/2022 14:02   CT HEAD WO CONTRAST  Result Date: 12/12/2022 CLINICAL  DATA:  Provided history: Mental status change, unknown cause. Increased falls. Weakness. History of dementia. EXAM: CT HEAD WITHOUT CONTRAST TECHNIQUE: Contiguous axial images were obtained from the base of the skull through the vertex without intravenous contrast. RADIATION DOSE REDUCTION: This exam was performed according to the departmental dose-optimization program which includes automated exposure control, adjustment of the mA and/or kV according to patient size and/or use of iterative reconstruction technique. COMPARISON:  Brain MRI 07/13/2022.  Head CT 06/02/2022. FINDINGS: Brain: Moderate to moderately advanced generalized cerebral atrophy. Mild cerebellar atrophy. Patchy and ill-defined hypoattenuation within the cerebral white matter, nonspecific but compatible with mild chronic small vessel ischemic disease. There is no acute intracranial hemorrhage. No demarcated cortical infarct. No extra-axial fluid collection. No evidence of an intracranial mass. No midline shift. Vascular: No hyperdense vessel.  Atherosclerotic calcifications. Skull: No calvarial fracture or aggressive osseous lesion. Sinuses/Orbits: No mass or acute finding within the imaged orbits. No significant paranasal sinus disease at the imaged levels. IMPRESSION: 1. No evidence of an acute intracranial abnormality. 2. Mild chronic small vessel ischemic changes within the cerebral white matter. 3. Moderate to moderately advanced generalized cerebral atrophy. 4. Mild cerebellar atrophy. Electronically Signed   By: Jackey Loge D.O.   On: 12/12/2022 08:23    Labs:  Basic Metabolic Panel: Recent Labs  Lab 12/26/22 0528 12/28/22 0736 12/29/22 0418  NA 135 135 135  K 3.5 3.8 3.7  CL 106 106 102  CO2 21*  21* 23  GLUCOSE 89 86 88  BUN 21 18 19   CREATININE 1.46* 1.60* 1.32*  CALCIUM 8.4* 8.2* 8.2*    CBC: Recent Labs  Lab 12/26/22 0528  WBC 8.4  HGB 13.9  HCT 41.5  MCV 96.5  PLT 280    CBG: No results for input(s): "GLUCAP" in the last 168 hours.  Brief HPI:   Clayton [name] is a 88 y.o. male with history of GI bleed, DVT, HTN in the past, TBI, cognitive impairment who was admitted to [facility] on 12/12/2022 with reports of weakness, tendency to bump into things as well as confusion x 1 day.  Wife also reported multiple falls for a week.  He was found to have subacute infarct right PCA and CTA showed abrupt occlusion right PCA.  And P1 segment with atherosclerotic disease bilateral carotid bifurcations.  Neurology felt the stroke was thrombotic and recommended Plavix for secondary stroke prevention due to history of aspirin allergy.  A Zio patch was also placed to rule out arrhythmias as cause of stroke.  He has had issues with agitation and Seroquel was added to help improve sleep hygiene.  Clayton [name] did report that patient had pulled off Zio patch multiple times due to confusion and agitation.  Therapy was working with patient was limited by left HH with left inattention, decreased coordination left upper extremity, cognitive deficit with difficulty following commands as well as poor safety awareness.  CIR was recommended due to functional decline.   Hospital Course: Clayton [name] was admitted to rehab 12/15/2022 for inpatient therapies to consist of PT, ST and OT at least three hours five days a week. Past admission physiatrist, therapy team and rehab RN have worked together to provide customized collaborative inpatient rehab.  He was maintained on Plavix throughout his stay and is tolerating this without any side effects.  Protonix was resumed due to history of GI bleed in the past.  Follow-up CBC showed H&H at platelets to be stable.  His blood pressures were monitored  on TID basis and has  been stable.  He continued to have some issues with sleep with disruption and Seroquel was titrated up to 75 mg with trazodone 50 mg as needed used additionally.  ZIO patch was resumed and as mentation improved he was able to keep this in place.  Follow-up checkup electrolytes showed some acute on chronic renal failure.  He was encouraged to increase fluids with improvement initially.  However he had recurrent worsening of renal status therefore was treated with IV fluids for hydration prior to discharge.  Wife has been advised to continue to offer fluids frequently to maintain adequate hydration.  His p.o. intake has improved and he is continent of bowel and bladder.  He continues to have left home anonymous hemianopsia but is able to turn to the left to compensate with cues.  Seroquel was decreased to 50 mg at bedtime with recommendation to use trazodone as needed.  He has been in good gains and is currently at supervision to contact-guard assist for safety.  He will continue to receive follow-up home health PT, OT, aide and ST by [facility] after discharge.    Rehab course: During patient's tay in rehab weekly team conferences were held to monitor patient's progress, set sgoals and discuss barriers to discharge. At admission, patient required min assist with basic ADL tasks and with mobility. He exhibited symptoms concerning for aspiration and immediate cough noted with use of straw.  He was kept on regular textures with thin liquids and straws were discontinued.  He also exhibited significant cognitive deficits as well as significant deficits in visuospatial skills. He  has had improvement in activity tolerance, balance, postural control as well as ability to compensate for deficits. He has had improvement in functional use LUE  and LLE as well as improvement in awareness and coordination. He requires supervision to [facility] for ADL tasks. He continues to be limited by left neglect as well as neuropathy  BLE. He requires supervision for transfers and to ambulate 200' with cues and RW. Family education has been completed with wife.  Patient and wife have been educated on speech intelligibility techniques, respiratory exercises   Disposition: Home  Diet:  Regular.thin liquids no straws   Discharge Instructions     Ambulatory referral to Neurology   Complete by: As directed    An appointment is requested in approximately: stroke follow up   Ambulatory referral to Physical Medicine Rehab   Complete by: As directed    Hospital follow up      Allergies as of 12/29/2022       Reactions   Aspirin Rash   Sulfa Antibiotics Rash        Medication List     STOP taking these medications    gabapentin 100 MG capsule Commonly known as: NEURONTIN       TAKE these medications    acetaminophen 325 MG tablet Commonly known as: TYLENOL Take 1-2 tablets (325-650 mg total) by mouth every 4 (four) hours as needed for mild pain. What changed:  medication strength how much to take when to take this reasons to take this   atorvastatin 40 MG tablet Commonly known as: LIPITOR Take 1 tablet (40 mg total) by mouth daily.   busPIRone 7.5 MG tablet Commonly known as: BUSPAR Take by mouth.   clopidogrel 75 MG tablet Commonly known as: PLAVIX Take 1 tablet (75 mg total) by mouth daily.   melatonin 5 MG Tabs Take 1 tablet (5  mg total) by mouth at bedtime.   MULTI VIT/FL PO Take by mouth.   pantoprazole 40 MG tablet Commonly known as: PROTONIX Take 1 tablet (40 mg total) by mouth daily.   QUEtiapine 50 MG tablet Commonly known as: SEROQUEL Take 1 tablet (50 mg total) by mouth at bedtime as needed (agitation, insomnia). What changed:  medication strength how much to take   traZODone 50 MG tablet Commonly known as: DESYREL Take 1 tablet (50 mg total) by mouth at bedtime as needed for sleep.   vitamin B-12 500 MCG tablet Commonly known as: CYANOCOBALAMIN Take 1 tablet  (500 mcg total) by mouth daily.   ZYRTEC PO Take by mouth.        Follow-up Information     Kirsteins, Victorino Sparrow, MD Follow up.   Specialty: Physical Medicine and Rehabilitation Why: office will call you with follow up appointment Contact information: [address] [place] [address] [phone]         [facility] Follow up.   Why: office will call you with follow up appointment- or you can follow up with neurology in [place]. Contact information: [address]     [address] [place] [address] [phone]        Alwyn Pea, MD Follow up.   Specialties: Cardiology, Internal Medicine Why: Call in 1-2 days for post hospital follow up Contact information: [address] [place] [address] [phone]         [facility] Follow up.   Why: Appointment with Dr. Yetta Flock in Jan Contact information: [address] [place] [address] [phone]                 Signed: Jacquelynn Cree 12/29/2022, 11:14 AM

## 2022-12-29 DIAGNOSIS — F411 Generalized anxiety disorder: Secondary | ICD-10-CM | POA: Insufficient documentation

## 2022-12-29 DIAGNOSIS — K219 Gastro-esophageal reflux disease without esophagitis: Secondary | ICD-10-CM | POA: Insufficient documentation

## 2022-12-29 DIAGNOSIS — N179 Acute kidney failure, unspecified: Secondary | ICD-10-CM | POA: Insufficient documentation

## 2022-12-29 DIAGNOSIS — I63411 Cerebral infarction due to embolism of right middle cerebral artery: Secondary | ICD-10-CM

## 2022-12-29 DIAGNOSIS — E7800 Pure hypercholesterolemia, unspecified: Secondary | ICD-10-CM | POA: Insufficient documentation

## 2022-12-29 DIAGNOSIS — G4701 Insomnia due to medical condition: Secondary | ICD-10-CM | POA: Insufficient documentation

## 2022-12-29 LAB — BASIC METABOLIC PANEL
Anion gap: 10 (ref 5–15)
BUN: 19 mg/dL (ref 8–23)
CO2: 23 mmol/L (ref 22–32)
Calcium: 8.2 mg/dL — ABNORMAL LOW (ref 8.9–10.3)
Chloride: 102 mmol/L (ref 98–111)
Creatinine, Ser: 1.32 mg/dL — ABNORMAL HIGH (ref 0.61–1.24)
GFR, Estimated: 53 mL/min — ABNORMAL LOW (ref >60)
Glucose, Bld: 88 mg/dL (ref 70–99)
Potassium: 3.7 mmol/L (ref 3.5–5.1)
Sodium: 135 mmol/L (ref 135–145)

## 2022-12-29 MED ORDER — ACETAMINOPHEN 325 MG PO TABS: 325.0000 mg | ORAL_TABLET | ORAL | Status: AC | PRN

## 2022-12-29 MED ORDER — MELATONIN 5 MG PO TABS: 5.0000 mg | ORAL_TABLET | Freq: Every day | ORAL | 0 refills | Status: AC

## 2022-12-29 MED ORDER — CLOPIDOGREL BISULFATE 75 MG PO TABS: 75.0000 mg | ORAL_TABLET | Freq: Every day | ORAL | 0 refills | Status: DC

## 2022-12-29 MED ORDER — QUETIAPINE FUMARATE 50 MG PO TABS: 50.0000 mg | ORAL_TABLET | Freq: Every evening | ORAL | 0 refills | Status: DC | PRN

## 2022-12-29 MED ORDER — ATORVASTATIN CALCIUM 40 MG PO TABS: 40.0000 mg | ORAL_TABLET | Freq: Every day | ORAL | 0 refills | Status: AC

## 2022-12-29 MED ORDER — TRAZODONE HCL 50 MG PO TABS: 50.0000 mg | ORAL_TABLET | Freq: Every evening | ORAL | 0 refills | Status: DC | PRN

## 2022-12-29 NOTE — Progress Notes (Signed)
Inpatient Rehabilitation Care Coordinator Discharge Note   Patient Details  Name: Clayton [name] MRN: [id number] Date of Birth: July 07, 1934   Discharge location: D/c to home with wife who will be primary caregiver  Length of Stay: 13 days  Discharge activity level: ambulatory level Supervision/CGA using RW  Home/community participation: Limted  Patient response XB:JYNWGN Literacy - How often do you need to have someone help you when you read instructions, pamphlets, or other written material from your doctor or pharmacy?: Often  Patient response FA:OZHYQM Isolation - How often do you feel lonely or isolated from those around you?: Never  Services provided included: MD, RD, PT, OT, RN, SLP, CM, TR, Pharmacy, Neuropsych, SW  Financial Services:  Field seismologist Utilized: Private Insurance [employer]  Choices offered to/list presented to: pt wife  Follow-up services arranged:  Home Health, DME Home Health Agency: [facility] for PT/OT/SLP/aide    DME : [facility] for TTB    Patient response to transportation need: Is the patient able to respond to transportation needs?: Yes In the past 12 months, has lack of transportation kept you from medical appointments or from getting medications?: No In the past 12 months, has lack of transportation kept you from meetings, work, or from getting things needed for daily living?: No   Patient/Family verbalized understanding of follow-up arrangements:  Yes  Individual responsible for coordination of the follow-up plan: contact pt wife [name] [phone]  Confirmed correct DME delivered: Gretchen Short 12/29/2022    Comments (or additional information):fam edu completed. Sitter listed provided.   Summary of Stay    Date/Time Discharge Planning CSW  12/27/22 1519 Pt will d/c to home with wife who will provide 24/7 care. HHA- [facility] for HHPT/OT/SLP. DME- shower chair. SW will confirm there are no barriers to  discharge. AAC  12/20/22 1315 Dicharging home with spouse to assist 24/7 at [facility] A level. CJB       Daman Steffenhagen A Lula Olszewski

## 2022-12-29 NOTE — Progress Notes (Signed)
INPATIENT REHABILITATION DISCHARGE NOTE   Discharge instructions by: Elita Quick PA  Verbalized understanding:yes  Skin care/Wound care healing? done Pain:done  IV's: done Tubes/Drains: done O2:done  Safety instructions: done Patient belongings: done Discharged WU:JWJX  Discharged via: car Notes:

## 2022-12-29 NOTE — Progress Notes (Addendum)
Inpatient Rehabilitation Discharge Medication Review by a Pharmacist  A complete drug regimen review was completed for this patient to identify any potential clinically significant medication issues.  High Risk Drug Classes Is patient taking? Indication by Medication  Antipsychotic Yes Quetiapine - agitation  Anticoagulant No   Antibiotic No   Opioid No   Antiplatelet Yes Clopidogrel - CVA  Hypoglycemics/insulin No   Vasoactive Medication No   Chemotherapy No   Other Yes Atorvastatin - HLD Buspirone - Anxiety Pantoprazole - Reflux  Cetirizine - allergy Melatonin/trazodone - sleep B12 - anemia     Type of Medication Issue Identified Description of Issue Recommendation(s)  Drug Interaction(s) (clinically significant)     Duplicate Therapy     Allergy     No Medication Administration End Date     Incorrect Dose     Additional Drug Therapy Needed     Significant med changes from prior encounter (inform family/care partners about these prior to discharge).    Other       Clinically significant medication issues were identified that warrant physician communication and completion of prescribed/recommended actions by midnight of the next day:  No  Name of provider notified for urgent issues identified:   Provider Method of Notification:     Pharmacist comments: None  Ulyses Southward, PharmD, BCIDP, AAHIVP, CPP Infectious Disease Pharmacist 12/29/2022 6:58 AM

## 2022-12-29 NOTE — Progress Notes (Signed)
Patient ID: Clayton [name], male   DOB: 1934/09/12, 88 y.o.   MRN: [id number] ZIO patch removed per protocol and returned via mail for interrogation. Pamelia Hoit

## 2022-12-29 NOTE — Progress Notes (Signed)
PROGRESS NOTE   Subjective/Complaints:  Reviewed labs, improved this am , IV d/ced Reviewed pt's discharge diagnosis , discussed no driving, power tools or sharp tools   ROS: Denies fever, chills, shortness of breath, chest pain, N/V/D, new motor or sensory changes + Insomnia   Objective:   No results found. No results for input(s): "WBC", "HGB", "HCT", "PLT" in the last 72 hours.   Recent Labs    12/28/22 0736 12/29/22 0418  NA 135 135  K 3.8 3.7  CL 106 102  CO2 21* 23  GLUCOSE 86 88  BUN 18 19  CREATININE 1.60* 1.32*  CALCIUM 8.2* 8.2*     Intake/Output Summary (Last 24 hours) at 12/29/2022 0836 Last data filed at 12/29/2022 0213 Gross per 24 hour  Intake 1050.15 ml  Output 550 ml  Net 500.15 ml        Physical Exam: Vital Signs Blood pressure 123/67, pulse 75, temperature 99 F (37.2 C), temperature source Oral, resp. rate 18, height 5\' 7"  (1.702 m), weight 67.4 kg, SpO2 97%.   General: No acute distress Mood and affect are appropriate Heart: Regular rate and rhythm no rubs murmurs or extra sounds Lungs: Clear to auscultation, breathing unlabored,dry crackles at hases no rales or wheezes Abdomen: Positive bowel sounds, soft nontender to palpation, nondistended Extremities: No clubbing, cyanosis, or edema Skin: No evidence of breakdown, no evidence of rash  Neurologic: Cranial nerves II through XII intact, motor strength is 5/5 in right 4/5 left deltoid, bicep, tricep, grip, hip flexor, knee extensors, ankle dorsiflexor and plantar flexor  Left homonymous hemianopsia but turns head to left to compensate  Musculoskeletal: Full range of motion in all 4 extremities. No joint swelling    Assessment/Plan: 1. Functional deficits due to R MCA infarct Stable for D/C today F/u PCP in 3-4 weeks F/u PM&R 2 weeks F/u neuro 1-2 mo  See D/C summary See D/C instructions   Care Tool:  Bathing     Body parts bathed by patient: Right arm, Left arm, Abdomen, Chest, Buttocks, Front perineal area, Left upper leg, Right upper leg, Right lower leg, Left lower leg, Face         Bathing assist Assist Level: Supervision/Verbal cueing     Upper Body Dressing/Undressing Upper body dressing   What is the patient wearing?: Pull over shirt    Upper body assist Assist Level: Independent with assistive device    Lower Body Dressing/Undressing Lower body dressing      What is the patient wearing?: Underwear/pull up, Pants     Lower body assist Assist for lower body dressing: Supervision/Verbal cueing     Toileting Toileting    Toileting assist Assist for toileting: Supervision/Verbal cueing     Transfers Chair/bed transfer  Transfers assist     Chair/bed transfer assist level: Supervision/Verbal cueing Chair/bed transfer assistive device: Geologist, engineering   Ambulation assist      Assist level: Supervision/Verbal cueing Assistive device: Walker-rolling Max distance: 310ft   Walk 10 feet activity   Assist     Assist level: Supervision/Verbal cueing Assistive device: Walker-rolling   Walk 50 feet activity   Assist  Assist level: Supervision/Verbal cueing Assistive device: Walker-rolling    Walk 150 feet activity   Assist    Assist level: Supervision/Verbal cueing Assistive device: Walker-rolling    Walk 10 feet on uneven surface  activity   Assist Walk 10 feet on uneven surfaces activity did not occur: Safety/medical concerns   Assist level: Supervision/Verbal cueing Assistive device: Walker-rolling   Wheelchair     Assist Is the patient using a wheelchair?: No Type of Wheelchair: Manual    Wheelchair assist level: Dependent - Patient 0% Max wheelchair distance: 300 ft    Wheelchair 50 feet with 2 turns activity    Assist        Assist Level: Dependent - Patient 0%   Wheelchair 150 feet activity      Assist      Assist Level: Dependent - Patient 0%   Blood pressure 123/67, pulse 75, temperature 99 F (37.2 C), temperature source Oral, resp. rate 18, height 5\' 7"  (1.702 m), weight 67.4 kg, SpO2 97%.  Medical Problem List and Plan: 1. Functional deficits secondary to R PCA thrombotic CVA, cognitive deficits, poor balance and Left homonymous hemianopsia - may have greater involvement of left lower quadrant             -patient may shower             D/c 8/29, [facility] PT/OT/SLP- HH therapy post discharge     2.  Antithrombotics: -DVT/anticoagulation:  Pharmaceutical: Lovenox             -antiplatelet therapy: Plavix  3. Pain Management:  Tylenol prn.   - 0 pain reported on nursing assessments; monitor  4. Mood/Behavior/Sleep/at bedtime aggitation:  LCSW to follow for evaluation and support.              -antipsychotic agents: N/A             -Zyprexa for agitation , has mostly had confusion rather than agitation      5. Neuropsych/cognition: This patient is not capable of making decisions on his own behalf.              - Delirium precautions, telesitter and bed alarm, utilize aides for orientation (hearing aides, glasses)  -8/26 Discussed deliirium with wife, she plans to talk more about behavioral strategies with SLP today  6. Skin/Wound Care: Routine pressure relief measures.  7. Fluids/Electrolytes/Nutrition: Monitor I/O. Check CMET in am. Baseline OP Cr appears 1.1-1.2.   - 8/19: BUN down, Cr up slightly; Encourage Po fluids, monitor I/Os, recheck in AM Intake 700-89mL per day for last 2 days     Latest Ref Rng & Units 12/29/2022    4:18 AM 12/28/2022    7:36 AM 12/26/2022    5:28 AM  BMP  Glucose 70 - 99 mg/dL 88  86  89   BUN 8 - 23 mg/dL 19  18  21    Creatinine 0.61 - 1.24 mg/dL 6.29  5.28  4.13   Sodium 135 - 145 mmol/L 135  135  135   Potassium 3.5 - 5.1 mmol/L 3.7  3.8  3.5   Chloride 98 - 111 mmol/L 102  106  106   CO2 22 - 32 mmol/L 23  21  21    Calcium  8.9 - 10.3 mg/dL 8.2  8.2  8.4    Creat improved on IVF may d/c  8. HTN: Monitor BP TID--not on any meds PTA.  - 8/27 well-controlled, continue to monitor Vitals:  12/28/22 1942 12/29/22 0543  BP: 128/73 123/67  Pulse: 76 75  Resp: 16 18  Temp: 98.4 F (36.9 C) 99 F (37.2 C)  SpO2: 97% 97%    9. H/o GIB X 2: Will resume Protonix and monitor stool for occult blood.             --monitor H/H   - HgB stable, LBM 8/18 large nonbloody; monitor  -8/26 HGB stable at 13.9    Latest Ref Rng & Units 12/26/2022    5:28 AM 12/19/2022    5:56 AM 12/16/2022    6:15 AM  CBC  WBC 4.0 - 10.5 K/uL 8.4  9.6  8.3   Hemoglobin 13.0 - 17.0 g/dL 16.1  09.6  04.5   Hematocrit 39.0 - 52.0 % 41.5  42.8  44.0   Platelets 150 - 400 K/uL 280  304  281     10. Anxiety d/o: Continue Buspar bid--mood calm at present    11. Cardiac monitoring: Has Zio patch/event monitor. Wife with multiple questions WU:JWJX,BJY, care.              Dr Juliann Pares is cardiologist  -Denies Chest pain  12.  Insomnia  -Currently on 75 mg Seroquel nightly, has trazodone as needed  -8/27 Will start melatonin, his wife reports he has used this in the past and tolerated  Drowsy this am , will reduce quetiapine and change to prn , also d/c zyprexa (not using ) , d/c Trazodone (not using)   LOS: 14 days A FACE TO FACE EVALUATION WAS PERFORMED  Erick Colace 12/29/2022, 8:36 AM

## 2023-01-03 DIAGNOSIS — I639 Cerebral infarction, unspecified: Secondary | ICD-10-CM | POA: Diagnosis not present

## 2023-01-04 ENCOUNTER — Telehealth: Payer: Self-pay | Admitting: *Deleted

## 2023-01-04 DIAGNOSIS — N179 Acute kidney failure, unspecified: Secondary | ICD-10-CM | POA: Diagnosis not present

## 2023-01-04 DIAGNOSIS — H53462 Homonymous bilateral field defects, left side: Secondary | ICD-10-CM | POA: Diagnosis not present

## 2023-01-04 DIAGNOSIS — I69391 Dysphagia following cerebral infarction: Secondary | ICD-10-CM | POA: Diagnosis not present

## 2023-01-04 DIAGNOSIS — R1312 Dysphagia, oropharyngeal phase: Secondary | ICD-10-CM | POA: Diagnosis not present

## 2023-01-04 DIAGNOSIS — I129 Hypertensive chronic kidney disease with stage 1 through stage 4 chronic kidney disease, or unspecified chronic kidney disease: Secondary | ICD-10-CM | POA: Diagnosis not present

## 2023-01-04 DIAGNOSIS — I69398 Other sequelae of cerebral infarction: Secondary | ICD-10-CM | POA: Diagnosis not present

## 2023-01-04 DIAGNOSIS — N189 Chronic kidney disease, unspecified: Secondary | ICD-10-CM | POA: Diagnosis not present

## 2023-01-04 DIAGNOSIS — R419 Unspecified symptoms and signs involving cognitive functions and awareness: Secondary | ICD-10-CM | POA: Diagnosis not present

## 2023-01-04 DIAGNOSIS — R2681 Unsteadiness on feet: Secondary | ICD-10-CM | POA: Diagnosis not present

## 2023-01-04 NOTE — Telephone Encounter (Signed)
Transition Care Management Unsuccessful Follow-up Telephone Call  Date of discharge and from where:  12/29/22 Inpt Rehab @ [facility]   Attempts:  1st Attempt  (Office closed 01/02/23 ) so call by date 01/04/23  Reason for unsuccessful TCM follow-up call:  Left voice message

## 2023-01-04 NOTE — Telephone Encounter (Signed)
Transitional Care call--who you talked with    Are you/is patient experiencing any problems since coming home? Are there any questions regarding any aspect of care? Are there any questions regarding medications administration/dosing? Are meds being taken as prescribed? Patient should review meds with caller to confirm Have there been any falls? Has Home Health been to the house and/or have they contacted you? If not, have you tried to contact them? Can we help you contact them? Are bowels and bladder emptying properly? Are there any unexpected incontinence issues? If applicable, is patient following bowel/bladder programs? Any fevers, problems with breathing, unexpected pain? Are there any skin problems or new areas of breakdown? Has the patient/family member arranged specialty MD follow up (ie cardiology/neurology/renal/surgical/etc)?  Can we help arrange? Does the patient need any other services or support that we can help arrange? Are caregivers following through as expected in assisting the patient? Has the patient quit smoking, drinking alcohol, or using drugs as recommended?  Appointment time, arrive time and who it is with here [address] [phone]

## 2023-01-05 DIAGNOSIS — R4189 Other symptoms and signs involving cognitive functions and awareness: Secondary | ICD-10-CM | POA: Diagnosis not present

## 2023-01-05 DIAGNOSIS — Z1330 Encounter for screening examination for mental health and behavioral disorders, unspecified: Secondary | ICD-10-CM | POA: Diagnosis not present

## 2023-01-05 DIAGNOSIS — Z8673 Personal history of transient ischemic attack (TIA), and cerebral infarction without residual deficits: Secondary | ICD-10-CM | POA: Diagnosis not present

## 2023-01-05 DIAGNOSIS — E538 Deficiency of other specified B group vitamins: Secondary | ICD-10-CM | POA: Diagnosis not present

## 2023-01-05 DIAGNOSIS — Z1331 Encounter for screening for depression: Secondary | ICD-10-CM | POA: Diagnosis not present

## 2023-01-05 DIAGNOSIS — Z2821 Immunization not carried out because of patient refusal: Secondary | ICD-10-CM | POA: Diagnosis not present

## 2023-01-05 DIAGNOSIS — I639 Cerebral infarction, unspecified: Secondary | ICD-10-CM | POA: Diagnosis not present

## 2023-01-05 DIAGNOSIS — R451 Restlessness and agitation: Secondary | ICD-10-CM | POA: Diagnosis not present

## 2023-01-06 ENCOUNTER — Telehealth: Payer: Self-pay | Admitting: Family Medicine

## 2023-01-06 DIAGNOSIS — R1312 Dysphagia, oropharyngeal phase: Secondary | ICD-10-CM | POA: Diagnosis not present

## 2023-01-06 DIAGNOSIS — N189 Chronic kidney disease, unspecified: Secondary | ICD-10-CM | POA: Diagnosis not present

## 2023-01-06 DIAGNOSIS — I69391 Dysphagia following cerebral infarction: Secondary | ICD-10-CM | POA: Diagnosis not present

## 2023-01-06 DIAGNOSIS — N179 Acute kidney failure, unspecified: Secondary | ICD-10-CM | POA: Diagnosis not present

## 2023-01-06 DIAGNOSIS — I69398 Other sequelae of cerebral infarction: Secondary | ICD-10-CM | POA: Diagnosis not present

## 2023-01-06 DIAGNOSIS — H53462 Homonymous bilateral field defects, left side: Secondary | ICD-10-CM | POA: Diagnosis not present

## 2023-01-06 DIAGNOSIS — R2681 Unsteadiness on feet: Secondary | ICD-10-CM | POA: Diagnosis not present

## 2023-01-06 DIAGNOSIS — R419 Unspecified symptoms and signs involving cognitive functions and awareness: Secondary | ICD-10-CM | POA: Diagnosis not present

## 2023-01-06 DIAGNOSIS — I129 Hypertensive chronic kidney disease with stage 1 through stage 4 chronic kidney disease, or unspecified chronic kidney disease: Secondary | ICD-10-CM | POA: Diagnosis not present

## 2023-01-06 NOTE — Telephone Encounter (Signed)
Home Health Verbal Orders - Caller/Agency: Cesar with [facility] Nurses Callback Number: [phone] Requesting PT Frequency: 2 x's a wk for 3 wks, and 1 x wk for 3 wks  This will start next week on Monday 9/09. Please assist further

## 2023-01-06 NOTE — Telephone Encounter (Signed)
Verbal orders given over the phone.  - Clayton [name]

## 2023-01-10 ENCOUNTER — Encounter: Payer: Medicare PPO | Attending: Registered Nurse | Admitting: Registered Nurse

## 2023-01-10 ENCOUNTER — Encounter: Payer: Self-pay | Admitting: Registered Nurse

## 2023-01-10 VITALS — BP 103/64 | HR 69 | Ht 67.0 in | Wt 153.4 lb

## 2023-01-10 DIAGNOSIS — G4700 Insomnia, unspecified: Secondary | ICD-10-CM

## 2023-01-10 DIAGNOSIS — I63531 Cerebral infarction due to unspecified occlusion or stenosis of right posterior cerebral artery: Secondary | ICD-10-CM | POA: Diagnosis not present

## 2023-01-10 MED ORDER — TRAZODONE HCL 50 MG PO TABS: 50.0000 mg | ORAL_TABLET | Freq: Every evening | ORAL | 0 refills | Status: AC | PRN

## 2023-01-10 MED ORDER — QUETIAPINE FUMARATE 50 MG PO TABS: 50.0000 mg | ORAL_TABLET | Freq: Every evening | ORAL | 0 refills | Status: AC | PRN

## 2023-01-10 MED ORDER — CLOPIDOGREL BISULFATE 75 MG PO TABS: 75.0000 mg | ORAL_TABLET | Freq: Every day | ORAL | 0 refills | Status: DC

## 2023-01-10 NOTE — Progress Notes (Signed)
Subjective:    Patient ID: Clayton [name], male    DOB: 01-Mar-1935, 88 y.o.   MRN: [id number]  HPI: Clayton [name] is a 88 y.o. male who is here for Transitional Care visit for F/U of his Cerebrum Stroke, Insomnia and Agitation. Marland Kitchen He presented to [facility] on 12/12/2022, he presented with weakness and confusion.   Dr. Alvester Morin : 12/12/2022 H&P HPI: Clayton [name] is a 88 y.o. male with medical history significant of cognitive impairment, history of TBI, hypertension presenting with weakness, confusion.  History primarily from patient's wife in the setting of confusion.  Per report, patient with worsening weakness, falls over the past week or so.  Symptoms been more predominant over the past day or so.  Baseline history of cognitive impairment.  Wife states that he is fairly ambulatory and is able to do his woodworking on a fairly regular basis.  Noted admission February 2024 with multifocal pneumonia that resulted in worsening cognitive impairment per the wife.  Has seen outpatient neurology for this.  Was placed on BuSpar as well as Neurontin per report.  No recent infections.  No reports of chest pain or shortness of breath.  No nausea or vomiting.  Weakness seems to predominate on the left side.  Positive difficulty with ambulation.  Non-smoker.  No reported ETOH use.  Presented to the ER afebrile, hemodynamically stable.  White count 7, hemoglobin 14.4, platelets 299, COVID flu RSV negative, ammonia level within normal limits, urinalysis not indicative of infection.  CT head within normal notes.  Chest x-ray pending.  Alcohol level within normal is.  CT Head: WO Contrast IMPRESSION: 1. No evidence of an acute intracranial abnormality. 2. Mild chronic small vessel ischemic changes within the cerebral white matter. 3. Moderate to moderately advanced generalized cerebral atrophy. 4. Mild cerebellar atrophy.  CTA: IMPRESSION: 1. Abrupt occlusion of the right PCA in the P1 segment. More  distal branch vessels show reconstituted flow. 2. Aortic atherosclerosis. 3. Atherosclerotic disease at both carotid bifurcations but without stenosis. 4. Atherosclerotic disease at both vertebral artery origins with 30-50% stenosis on the right and 50% stenosis on the left. 5. Atherosclerotic disease of the proximal left subclavian artery with stenosis estimated at 30%. Second focal stenosis of the subclavian artery just proximal to the vertebral artery origin with stenosis estimated at 30-50%.   Aortic Atherosclerosis (ICD10-I70.0).  Neurology was consulted and recommended Plavix for secondary stoke prevention.   He was admitted to inpatient rehabilitation on 12/15/2022 and discharged home on 12/29/2022. He is receiving [facility] with [facility]. He denies pain. He rates his pain 0. Also reports good appetite.   Mrs. Yetta Barre in room , all questions answered.       Pain Inventory Average Pain 0 Pain Right Now 0 My pain is  no pain  BOWEL Number of stools per week: 6-7  BLADDER Normal  Mobility walk with assistance use a walker how many minutes can you walk? 5 ability to climb steps?  yes do you drive?  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  yes  has seen new MD  for PCP @ [facility]  Physicians involved in your care Leonor Liv, MD      Family History  Problem Relation Age of Onset  . Heart disease Father    Social History   Socioeconomic History  . Marital status: Married    Spouse name: Not on file  . Number of children: 3  . Years of  education: Not on file  . Highest education level: Associate degree: occupational, Scientist, product/process development, or vocational program  Occupational History  . Not on file  Tobacco Use  . Smoking status: Former    Current packs/day: 0.00    Average packs/day: 0.5 packs/day for 3.5 years (1.7 ttl pk-yrs)    Types: Cigarettes    Start date: 07/03/1955    Quit date: 01/01/1959    Years since quitting: 64.0  . Smokeless tobacco: Never  . Tobacco comments:    I smoked from about age 51 to 29.  Vaping Use  . Vaping status: Never Used  Substance and Sexual Activity  . Alcohol use: Yes    Alcohol/week: 3.0 standard drinks of alcohol    Types: 3 Glasses of wine per week    Comment: once a week  . Drug use: No  . Sexual activity: Not Currently    Birth control/protection: None  Other Topics Concern  . 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 Difficulty of Paying Living Expenses: Not very hard  Food Insecurity: No Food Insecurity (01/03/2023)   Received from [facility]   Hunger Vital Sign   . Worried About Programme researcher, broadcasting/film/video in the Last Year: Never true   . Ran Out of Food in the Last Year: Never true  Transportation Needs: No Transportation Needs (01/03/2023)   Received from [facility]   [facility] - Transportation   . In the past 12 months, has lack of transportation kept you from medical appointments or from getting medications?: No   . Lack of Transportation (Non-Medical): No  Physical Activity: Inactive (11/10/2022)   Exercise Vital Sign   . Days of Exercise per Week: 0 days   . Minutes of Exercise per Session: 0 min  Stress: No Stress Concern Present (11/10/2022)   [employer] of Occupational Health - Occupational Stress Questionnaire    . Feeling of Stress : Not at all  Social Connections: Moderately Isolated (11/10/2022)   Social Connection and Isolation Panel [NHANES]   . Frequency of Communication with Friends and Family: More than three times a week   . Frequency of Social Gatherings with Friends and Family: Three times a week   . Attends Religious Services: Never   . Active Member of Clubs or Organizations: No   . Attends Banker Meetings: Never   . Marital Status: Married   Past Surgical History:  Procedure Laterality Date  . COLONOSCOPY  2014   cleared for 5 yrs  . COLONOSCOPY N/A 09/01/2018   Procedure: COLONOSCOPY;  Surgeon: Pasty Spillers, MD;  Location: [facility] ENDOSCOPY;  Service: Endoscopy;  Laterality: N/A;  . CRANIECTOMY FOR DEPRESSED SKULL FRACTURE  1960'S  . ESOPHAGOGASTRODUODENOSCOPY N/A 09/01/2018   Procedure: ESOPHAGOGASTRODUODENOSCOPY (EGD);  Surgeon: Pasty Spillers, MD;  Location: [facility] ENDOSCOPY;  Service: Endoscopy;  Laterality:  N/A;  . HERNIA REPAIR     X2  . IVC FILTER REMOVAL N/A 07/03/2017   Procedure: IVC FILTER REMOVAL;  Surgeon: Annice Needy, MD;  Location: [facility] INVASIVE CV LAB;  Service: Cardiovascular;  Laterality: N/A;  . LUMBAR LAMINECTOMY/DECOMPRESSION MICRODISCECTOMY N/A 04/20/2020   Procedure: L2/3, L3/4 LAMINECTOMY;  Surgeon: Lucy Chris, MD;  Location: [facility] ORS;  Service: Neurosurgery;  Laterality: N/A;  2ND CASE  . PERIPHERAL VASCULAR THROMBECTOMY Right 05/22/2017   Procedure: PERIPHERAL VASCULAR THROMBECTOMY;  Surgeon: Annice Needy, MD;  Location: [facility] INVASIVE CV LAB;  Service: Cardiovascular;  Laterality: Right;  . SPINE SURGERY  December 2021  . TONSILLECTOMY     Past Medical History:  Diagnosis Date  . Acute gastrointestinal hemorrhage   . Acute hypoxemic respiratory failure (HCC) 05/25/2022  . Acute thrombotic stroke (HCC) 12/14/2022  . Basal cell carcinoma   . Blood in stool 08/30/2018  . Blood transfusion without reported diagnosis 74 (age  56)   After traumatic brain injury  . Broken ribs    2 BROKEN ON RIBS  . CKD (chronic kidney disease)    PT DENIES  . Color blindness   . DVT (deep venous thrombosis) (HCC)    RIGHT LEG  . Dyslexia   . Fracture dislocation of wrist    RIGHT  . GI bleed 09/02/2018  . Hiatal hernia with GERD   . HTN (hypertension)   . Hypokalemia 05/26/2022  . Hyponatremia 05/25/2022  . Symptomatic anemia 09/03/2018  . TBI (traumatic brain injury) (HCC)    w/skull Fx and crani in 1960s   BP 103/64   Pulse 69   Ht 5\' 7"  (1.702 m)   Wt 153 lb 6.4 oz (69.6 kg)   SpO2 96%   BMI 24.03 kg/m   Opioid Risk Score:   Fall Risk Score:  `1  Depression screen PHQ 2/9     01/10/2023    1:05 PM 11/10/2022    8:48 AM 08/04/2022    8:37 AM 07/01/2022   10:34 AM 06/10/2022    1:35 PM 06/02/2022    3:10 PM 11/30/2021    8:36 AM  Depression screen PHQ 2/9  Decreased Interest 0 0 0 0 0 0 0  Down, Depressed, Hopeless 0 0 0 0 0 0 0  PHQ - 2 Score 0 0 0 0 0 0 0  Altered sleeping 0 0 0 0 0 0 1  Tired, decreased energy 0 0 1 1 0 0 1  Change in appetite 0 0 0 0 0 0 0  Feeling bad or failure about yourself  0 0 0 0 0 0 0  Trouble concentrating 0 0 0 0 0 0 0  Moving slowly or fidgety/restless 0 0 0 0 0 0 0  Suicidal thoughts 0 0 0 0 0 0 0  PHQ-9 Score 0 0 1 1 0 0 2  Difficult doing work/chores  Not difficult at all Not difficult at all Not difficult at all Not difficult at all Not difficult at all Not difficult at all     Review of Systems  Constitutional: Negative.   HENT: Negative.    Eyes: Negative.   Respiratory: Negative.    Cardiovascular: Negative.   Gastrointestinal: Negative.   Endocrine: Negative.   Genitourinary: Negative.   Musculoskeletal:  Positive for gait problem.  Skin: Negative.   Allergic/Immunologic: Negative.   Neurological:  Positive for weakness.  Hematological:  Bruises/bleeds easily.       Plavix  Psychiatric/Behavioral:  Positive for confusion.   All other systems reviewed and  are negative.      Objective:   Physical Exam Vitals and nursing note reviewed.  Constitutional:      Appearance: Normal appearance.  Cardiovascular:     Rate and Rhythm: Normal rate and regular rhythm.     Pulses: Normal pulses.     Heart sounds: Normal heart sounds.  Pulmonary:     Effort: Pulmonary effort is normal.     Breath sounds: Normal breath sounds.  Musculoskeletal:     Cervical back: Normal range of motion and neck supple.     Comments: Normal Muscle Bulk and Muscle Testing Reveals:  Upper Extremities: Full ROM and Muscle Strength on Right  5/5 and Left 4/5  Lower Extremities: Full ROM and Muscle Strength 5/5 Arises from Table  slowly using walker for support Narrow Based  Gait     Skin:    General: Skin is warm and dry.  Neurological:     Mental Status: He is alert and oriented to person, place, and time.  Psychiatric:        Mood and Affect: Mood normal.        Behavior: Behavior normal.         Assessment & Plan:  Cerebrum Stroke: Continue current medication regimen. Continue Home Health Therapy with [facility]. ,He has a scheduled appointment with [facility] Neurology, Mrs. Haverland states he has a Insurance account manager at [place] [facility] and she is in the process of obtaining a Neurology appointment with them. Mrs. Rybarczyk was instructed to keep scheduled Neurology appointment with [facility], and call their office if she obtains an appointment with [facility] Neurology, she verbalizes understanding.   Insomnia and Agitation: Continue Trazodone and Seroquel as needed. She verbalizes understanding.   F/U with Dr Wynn Banker in 4-6 weeks

## 2023-01-11 DIAGNOSIS — R2681 Unsteadiness on feet: Secondary | ICD-10-CM | POA: Diagnosis not present

## 2023-01-11 DIAGNOSIS — N179 Acute kidney failure, unspecified: Secondary | ICD-10-CM | POA: Diagnosis not present

## 2023-01-11 DIAGNOSIS — I69391 Dysphagia following cerebral infarction: Secondary | ICD-10-CM | POA: Diagnosis not present

## 2023-01-11 DIAGNOSIS — H53462 Homonymous bilateral field defects, left side: Secondary | ICD-10-CM | POA: Diagnosis not present

## 2023-01-11 DIAGNOSIS — I129 Hypertensive chronic kidney disease with stage 1 through stage 4 chronic kidney disease, or unspecified chronic kidney disease: Secondary | ICD-10-CM | POA: Diagnosis not present

## 2023-01-11 DIAGNOSIS — R1312 Dysphagia, oropharyngeal phase: Secondary | ICD-10-CM | POA: Diagnosis not present

## 2023-01-11 DIAGNOSIS — N189 Chronic kidney disease, unspecified: Secondary | ICD-10-CM | POA: Diagnosis not present

## 2023-01-11 DIAGNOSIS — I69398 Other sequelae of cerebral infarction: Secondary | ICD-10-CM | POA: Diagnosis not present

## 2023-01-11 DIAGNOSIS — R419 Unspecified symptoms and signs involving cognitive functions and awareness: Secondary | ICD-10-CM | POA: Diagnosis not present

## 2023-01-13 ENCOUNTER — Telehealth: Payer: Self-pay | Admitting: Family Medicine

## 2023-01-13 DIAGNOSIS — N179 Acute kidney failure, unspecified: Secondary | ICD-10-CM | POA: Diagnosis not present

## 2023-01-13 DIAGNOSIS — R419 Unspecified symptoms and signs involving cognitive functions and awareness: Secondary | ICD-10-CM | POA: Diagnosis not present

## 2023-01-13 DIAGNOSIS — I69391 Dysphagia following cerebral infarction: Secondary | ICD-10-CM | POA: Diagnosis not present

## 2023-01-13 DIAGNOSIS — H53462 Homonymous bilateral field defects, left side: Secondary | ICD-10-CM | POA: Diagnosis not present

## 2023-01-13 DIAGNOSIS — R1312 Dysphagia, oropharyngeal phase: Secondary | ICD-10-CM | POA: Diagnosis not present

## 2023-01-13 DIAGNOSIS — N189 Chronic kidney disease, unspecified: Secondary | ICD-10-CM | POA: Diagnosis not present

## 2023-01-13 DIAGNOSIS — I129 Hypertensive chronic kidney disease with stage 1 through stage 4 chronic kidney disease, or unspecified chronic kidney disease: Secondary | ICD-10-CM | POA: Diagnosis not present

## 2023-01-13 DIAGNOSIS — I69398 Other sequelae of cerebral infarction: Secondary | ICD-10-CM | POA: Diagnosis not present

## 2023-01-13 DIAGNOSIS — R2681 Unsteadiness on feet: Secondary | ICD-10-CM | POA: Diagnosis not present

## 2023-01-13 NOTE — Telephone Encounter (Signed)
FYI  KP

## 2023-01-13 NOTE — Telephone Encounter (Signed)
Copied from CRM [id number]. Topic: General - Inquiry >> Jan 13, 2023 10:39 AM Marlow Baars wrote: Reason for CRM: Maureen Ralphs with [facility] called in to report a missed visit on 9/11 to his provider.

## 2023-01-16 ENCOUNTER — Encounter: Payer: Self-pay | Admitting: Physical Medicine & Rehabilitation

## 2023-01-16 ENCOUNTER — Telehealth: Payer: Self-pay

## 2023-01-16 DIAGNOSIS — I63531 Cerebral infarction due to unspecified occlusion or stenosis of right posterior cerebral artery: Secondary | ICD-10-CM

## 2023-01-16 NOTE — Telephone Encounter (Signed)
Called [facility] and spoke to [place]- gave new pcp info to her. [phone]

## 2023-01-17 DIAGNOSIS — R2689 Other abnormalities of gait and mobility: Secondary | ICD-10-CM | POA: Diagnosis not present

## 2023-01-17 DIAGNOSIS — I129 Hypertensive chronic kidney disease with stage 1 through stage 4 chronic kidney disease, or unspecified chronic kidney disease: Secondary | ICD-10-CM | POA: Diagnosis not present

## 2023-01-17 DIAGNOSIS — R2681 Unsteadiness on feet: Secondary | ICD-10-CM | POA: Diagnosis not present

## 2023-01-17 DIAGNOSIS — N189 Chronic kidney disease, unspecified: Secondary | ICD-10-CM | POA: Diagnosis not present

## 2023-01-17 DIAGNOSIS — I639 Cerebral infarction, unspecified: Secondary | ICD-10-CM | POA: Diagnosis not present

## 2023-01-17 DIAGNOSIS — R413 Other amnesia: Secondary | ICD-10-CM | POA: Diagnosis not present

## 2023-01-17 DIAGNOSIS — I69398 Other sequelae of cerebral infarction: Secondary | ICD-10-CM | POA: Diagnosis not present

## 2023-01-17 DIAGNOSIS — I69391 Dysphagia following cerebral infarction: Secondary | ICD-10-CM | POA: Diagnosis not present

## 2023-01-17 DIAGNOSIS — R451 Restlessness and agitation: Secondary | ICD-10-CM | POA: Diagnosis not present

## 2023-01-17 DIAGNOSIS — R419 Unspecified symptoms and signs involving cognitive functions and awareness: Secondary | ICD-10-CM | POA: Diagnosis not present

## 2023-01-17 DIAGNOSIS — H53462 Homonymous bilateral field defects, left side: Secondary | ICD-10-CM | POA: Diagnosis not present

## 2023-01-17 DIAGNOSIS — N179 Acute kidney failure, unspecified: Secondary | ICD-10-CM | POA: Diagnosis not present

## 2023-01-17 DIAGNOSIS — G479 Sleep disorder, unspecified: Secondary | ICD-10-CM | POA: Diagnosis not present

## 2023-01-17 DIAGNOSIS — R1312 Dysphagia, oropharyngeal phase: Secondary | ICD-10-CM | POA: Diagnosis not present

## 2023-01-17 DIAGNOSIS — F39 Unspecified mood [affective] disorder: Secondary | ICD-10-CM | POA: Diagnosis not present

## 2023-01-18 DIAGNOSIS — N179 Acute kidney failure, unspecified: Secondary | ICD-10-CM | POA: Diagnosis not present

## 2023-01-18 DIAGNOSIS — H53462 Homonymous bilateral field defects, left side: Secondary | ICD-10-CM | POA: Diagnosis not present

## 2023-01-18 DIAGNOSIS — R1312 Dysphagia, oropharyngeal phase: Secondary | ICD-10-CM | POA: Diagnosis not present

## 2023-01-18 DIAGNOSIS — I69391 Dysphagia following cerebral infarction: Secondary | ICD-10-CM | POA: Diagnosis not present

## 2023-01-18 DIAGNOSIS — I129 Hypertensive chronic kidney disease with stage 1 through stage 4 chronic kidney disease, or unspecified chronic kidney disease: Secondary | ICD-10-CM | POA: Diagnosis not present

## 2023-01-18 DIAGNOSIS — I69398 Other sequelae of cerebral infarction: Secondary | ICD-10-CM | POA: Diagnosis not present

## 2023-01-18 DIAGNOSIS — N189 Chronic kidney disease, unspecified: Secondary | ICD-10-CM | POA: Diagnosis not present

## 2023-01-18 DIAGNOSIS — R209 Unspecified disturbances of skin sensation: Secondary | ICD-10-CM | POA: Diagnosis not present

## 2023-01-18 DIAGNOSIS — R2681 Unsteadiness on feet: Secondary | ICD-10-CM | POA: Diagnosis not present

## 2023-01-18 DIAGNOSIS — R419 Unspecified symptoms and signs involving cognitive functions and awareness: Secondary | ICD-10-CM | POA: Diagnosis not present

## 2023-01-19 DIAGNOSIS — R419 Unspecified symptoms and signs involving cognitive functions and awareness: Secondary | ICD-10-CM | POA: Diagnosis not present

## 2023-01-19 DIAGNOSIS — N189 Chronic kidney disease, unspecified: Secondary | ICD-10-CM | POA: Diagnosis not present

## 2023-01-19 DIAGNOSIS — N179 Acute kidney failure, unspecified: Secondary | ICD-10-CM | POA: Diagnosis not present

## 2023-01-19 DIAGNOSIS — I69391 Dysphagia following cerebral infarction: Secondary | ICD-10-CM | POA: Diagnosis not present

## 2023-01-19 DIAGNOSIS — R1312 Dysphagia, oropharyngeal phase: Secondary | ICD-10-CM | POA: Diagnosis not present

## 2023-01-19 DIAGNOSIS — I69398 Other sequelae of cerebral infarction: Secondary | ICD-10-CM | POA: Diagnosis not present

## 2023-01-19 DIAGNOSIS — H53462 Homonymous bilateral field defects, left side: Secondary | ICD-10-CM | POA: Diagnosis not present

## 2023-01-19 DIAGNOSIS — I129 Hypertensive chronic kidney disease with stage 1 through stage 4 chronic kidney disease, or unspecified chronic kidney disease: Secondary | ICD-10-CM | POA: Diagnosis not present

## 2023-01-19 DIAGNOSIS — R2681 Unsteadiness on feet: Secondary | ICD-10-CM | POA: Diagnosis not present

## 2023-01-20 DIAGNOSIS — R1312 Dysphagia, oropharyngeal phase: Secondary | ICD-10-CM | POA: Diagnosis not present

## 2023-01-20 DIAGNOSIS — I69391 Dysphagia following cerebral infarction: Secondary | ICD-10-CM | POA: Diagnosis not present

## 2023-01-20 DIAGNOSIS — R2681 Unsteadiness on feet: Secondary | ICD-10-CM | POA: Diagnosis not present

## 2023-01-20 DIAGNOSIS — I69398 Other sequelae of cerebral infarction: Secondary | ICD-10-CM | POA: Diagnosis not present

## 2023-01-20 DIAGNOSIS — I129 Hypertensive chronic kidney disease with stage 1 through stage 4 chronic kidney disease, or unspecified chronic kidney disease: Secondary | ICD-10-CM | POA: Diagnosis not present

## 2023-01-20 DIAGNOSIS — R419 Unspecified symptoms and signs involving cognitive functions and awareness: Secondary | ICD-10-CM | POA: Diagnosis not present

## 2023-01-20 DIAGNOSIS — H53462 Homonymous bilateral field defects, left side: Secondary | ICD-10-CM | POA: Diagnosis not present

## 2023-01-20 DIAGNOSIS — N189 Chronic kidney disease, unspecified: Secondary | ICD-10-CM | POA: Diagnosis not present

## 2023-01-20 DIAGNOSIS — N179 Acute kidney failure, unspecified: Secondary | ICD-10-CM | POA: Diagnosis not present

## 2023-01-24 DIAGNOSIS — I69391 Dysphagia following cerebral infarction: Secondary | ICD-10-CM | POA: Diagnosis not present

## 2023-01-24 DIAGNOSIS — I69398 Other sequelae of cerebral infarction: Secondary | ICD-10-CM | POA: Diagnosis not present

## 2023-01-24 DIAGNOSIS — I129 Hypertensive chronic kidney disease with stage 1 through stage 4 chronic kidney disease, or unspecified chronic kidney disease: Secondary | ICD-10-CM | POA: Diagnosis not present

## 2023-01-24 DIAGNOSIS — R2681 Unsteadiness on feet: Secondary | ICD-10-CM | POA: Diagnosis not present

## 2023-01-24 DIAGNOSIS — R1312 Dysphagia, oropharyngeal phase: Secondary | ICD-10-CM | POA: Diagnosis not present

## 2023-01-24 DIAGNOSIS — R419 Unspecified symptoms and signs involving cognitive functions and awareness: Secondary | ICD-10-CM | POA: Diagnosis not present

## 2023-01-24 DIAGNOSIS — N189 Chronic kidney disease, unspecified: Secondary | ICD-10-CM | POA: Diagnosis not present

## 2023-01-24 DIAGNOSIS — H53462 Homonymous bilateral field defects, left side: Secondary | ICD-10-CM | POA: Diagnosis not present

## 2023-01-24 DIAGNOSIS — N179 Acute kidney failure, unspecified: Secondary | ICD-10-CM | POA: Diagnosis not present

## 2023-01-26 DIAGNOSIS — R2681 Unsteadiness on feet: Secondary | ICD-10-CM | POA: Diagnosis not present

## 2023-01-26 DIAGNOSIS — I129 Hypertensive chronic kidney disease with stage 1 through stage 4 chronic kidney disease, or unspecified chronic kidney disease: Secondary | ICD-10-CM | POA: Diagnosis not present

## 2023-01-26 DIAGNOSIS — N189 Chronic kidney disease, unspecified: Secondary | ICD-10-CM | POA: Diagnosis not present

## 2023-01-26 DIAGNOSIS — H53462 Homonymous bilateral field defects, left side: Secondary | ICD-10-CM | POA: Diagnosis not present

## 2023-01-26 DIAGNOSIS — I69391 Dysphagia following cerebral infarction: Secondary | ICD-10-CM | POA: Diagnosis not present

## 2023-01-26 DIAGNOSIS — R1312 Dysphagia, oropharyngeal phase: Secondary | ICD-10-CM | POA: Diagnosis not present

## 2023-01-26 DIAGNOSIS — N179 Acute kidney failure, unspecified: Secondary | ICD-10-CM | POA: Diagnosis not present

## 2023-01-26 DIAGNOSIS — R419 Unspecified symptoms and signs involving cognitive functions and awareness: Secondary | ICD-10-CM | POA: Diagnosis not present

## 2023-01-26 DIAGNOSIS — I69398 Other sequelae of cerebral infarction: Secondary | ICD-10-CM | POA: Diagnosis not present

## 2023-01-27 ENCOUNTER — Ambulatory Visit: Payer: Medicare PPO | Admitting: Family Medicine

## 2023-01-27 NOTE — Addendum Note (Signed)
Addended by: Doreene Eland on: 01/27/2023 11:40 AM   Modules accepted: Orders

## 2023-01-27 NOTE — Addendum Note (Signed)
Addended by: Doreene Eland on: 01/27/2023 08:51 AM   Modules accepted: Orders

## 2023-01-30 ENCOUNTER — Ambulatory Visit: Payer: Medicare PPO | Attending: Physical Medicine & Rehabilitation | Admitting: Speech Pathology

## 2023-01-30 ENCOUNTER — Encounter: Payer: Self-pay | Admitting: Occupational Therapy

## 2023-01-30 ENCOUNTER — Ambulatory Visit: Payer: Medicare PPO | Admitting: Occupational Therapy

## 2023-01-30 ENCOUNTER — Ambulatory Visit: Payer: Medicare PPO

## 2023-01-30 DIAGNOSIS — R296 Repeated falls: Secondary | ICD-10-CM | POA: Diagnosis not present

## 2023-01-30 DIAGNOSIS — I63531 Cerebral infarction due to unspecified occlusion or stenosis of right posterior cerebral artery: Secondary | ICD-10-CM | POA: Diagnosis not present

## 2023-01-30 DIAGNOSIS — R2681 Unsteadiness on feet: Secondary | ICD-10-CM

## 2023-01-30 DIAGNOSIS — R490 Dysphonia: Secondary | ICD-10-CM | POA: Diagnosis not present

## 2023-01-30 DIAGNOSIS — I6389 Other cerebral infarction: Secondary | ICD-10-CM | POA: Diagnosis not present

## 2023-01-30 DIAGNOSIS — R41841 Cognitive communication deficit: Secondary | ICD-10-CM | POA: Diagnosis not present

## 2023-01-30 DIAGNOSIS — R414 Neurologic neglect syndrome: Secondary | ICD-10-CM | POA: Diagnosis not present

## 2023-01-30 DIAGNOSIS — R262 Difficulty in walking, not elsewhere classified: Secondary | ICD-10-CM

## 2023-01-30 DIAGNOSIS — R278 Other lack of coordination: Secondary | ICD-10-CM

## 2023-01-30 DIAGNOSIS — R471 Dysarthria and anarthria: Secondary | ICD-10-CM | POA: Diagnosis not present

## 2023-01-30 NOTE — Therapy (Signed)
OUTPATIENT OCCUPATIONAL THERAPY NEURO EVALUATION  Patient Name: Clayton [name] MRN: [id number] DOB:12-06-1934, 88 y.o., male Today's Date: 01/30/2023  PCP: Clayton [name]  REFERRING PROVIDER: Clydie [name]   END OF SESSION:  OT End of Session - 01/30/23 0946     Visit Number 1    Number of Visits 24    Date for OT Re-Evaluation 04/24/23    OT Start Time 1015    OT Stop Time 1100    OT Time Calculation (min) 45 min    Equipment Utilized During Treatment RW    Activity Tolerance Patient tolerated treatment well    Behavior During Therapy Clayton [name] for tasks assessed/performed             Past Medical History:  Diagnosis Date   Acute gastrointestinal hemorrhage    Acute hypoxemic respiratory failure (HCC) 05/25/2022   Acute thrombotic stroke (HCC) 12/14/2022   Basal cell carcinoma    Blood in stool 08/30/2018   Blood transfusion without reported diagnosis 69 (age 24)   After traumatic brain injury   Broken ribs    2 BROKEN ON RIBS   CKD (chronic kidney disease)    PT DENIES   Color blindness    DVT (deep venous thrombosis) (HCC)    RIGHT LEG   Dyslexia    Fracture dislocation of wrist    RIGHT   GI bleed 09/02/2018   Hiatal hernia with GERD    HTN (hypertension)    Hypokalemia 05/26/2022   Hyponatremia 05/25/2022   Symptomatic anemia 09/03/2018   TBI (traumatic brain injury) (HCC)    w/skull Fx and crani in 1960s   Past Surgical History:  Procedure Laterality Date   COLONOSCOPY  2014   cleared for 5 yrs   COLONOSCOPY N/A 09/01/2018   Procedure: COLONOSCOPY;  Surgeon: Pasty Spillers, MD;  Location: [facility] ENDOSCOPY;  Service: Endoscopy;  Laterality: N/A;   CRANIECTOMY FOR DEPRESSED SKULL FRACTURE  1960'S   ESOPHAGOGASTRODUODENOSCOPY N/A 09/01/2018   Procedure: ESOPHAGOGASTRODUODENOSCOPY (EGD);  Surgeon: Pasty Spillers, MD;  Location: [facility] ENDOSCOPY;  Service: Endoscopy;  Laterality: N/A;   HERNIA REPAIR     X2   IVC FILTER REMOVAL N/A  07/03/2017   Procedure: IVC FILTER REMOVAL;  Surgeon: Annice Needy, MD;  Location: [facility] INVASIVE CV LAB;  Service: Cardiovascular;  Laterality: N/A;   LUMBAR LAMINECTOMY/DECOMPRESSION MICRODISCECTOMY N/A 04/20/2020   Procedure: L2/3, L3/4 LAMINECTOMY;  Surgeon: Lucy Chris, MD;  Location: [facility] ORS;  Service: Neurosurgery;  Laterality: N/A;  2ND CASE   PERIPHERAL VASCULAR THROMBECTOMY Right 05/22/2017   Procedure: PERIPHERAL VASCULAR THROMBECTOMY;  Surgeon: Annice Needy, MD;  Location: [facility] INVASIVE CV LAB;  Service: Cardiovascular;  Laterality: Right;   SPINE SURGERY  December 2021   TONSILLECTOMY     Patient Active Problem List   Diagnosis Date Noted   Insomnia due to medical condition 12/29/2022   Elevated low density lipoprotein (LDL) cholesterol level 12/29/2022   Generalized anxiety disorder 12/29/2022   GERD (gastroesophageal reflux disease) 12/29/2022   ARF (acute renal failure) (HCC) 12/29/2022   Stroke (cerebrum) (HCC) 12/15/2022   Acute thrombotic stroke (HCC) 12/14/2022   Acute delirium 12/14/2022   Overweight (BMI 25.0-29.9) 12/13/2022   Weakness 12/12/2022   Cognitive impairment 12/12/2022   SOB (shortness of breath) 06/02/2022   Acute respiratory failure with hypoxia (HCC) 05/25/2022   Multifocal pneumonia 05/24/2022   Falls 05/24/2022   Acute left-sided low back pain with left-sided sciatica 02/25/2020   Lumbar stenosis with neurogenic  claudication 02/25/2020   Diverticulosis of colon with hemorrhage    Diverticulosis of small intestine without hemorrhage    HTN (hypertension) 08/30/2018   Deep vein thrombosis (DVT) of proximal vein of right lower extremity (HCC) 06/14/2017    ONSET DATE: 12/12/22  REFERRING DIAG: CVA  THERAPY DIAG:  Cerebrovascular accident (CVA) due to other mechanism (HCC)  Hemi-inattention  Other lack of coordination  Rationale for Evaluation and Treatment: Rehabilitation  SUBJECTIVE:   SUBJECTIVE STATEMENT: Pt denies  Pt  accompanied by: significant other  PERTINENT HISTORY: Clayton [name] is a 88 y.o. male with medical history significant of cognitive impairment, history of TBI, hypertension presenting with weakness, confusion. Admitted to [name] 12/12/22 for worsening weakness and cognitive impairment. Per MRI impression: "Scattered small acute to subacute infarcts in the right PCA distribution". Completed inpatient rehab stay from 12/15/22 to 12/28/22.   PRECAUTIONS: Fall  WEIGHT BEARING RESTRICTIONS: No  PAIN:  Are you having pain? No  FALLS: Has patient fallen in last 6 months? Yes. 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  OBJECTIVE:   HAND DOMINANCE: Right  ADLs: Overall ADLs: MIN A + cues for initiation  Transfers/ambulation related to ADLs: SUPERVISION with RW Eating: SETUP Grooming: SETUP + cues for initiation  UB Dressing: SETUP LB Dressing: MIN A Toileting: cueing Bathing: MIN A  Tub Shower transfers: MOD A  Equipment: Shower seat with back  IADLs: Shopping: spouse completes Light housekeeping: spouse completes Meal Prep: spouse completes Community mobility: spouse completes Medication management: spouse completes Landscape architect: spouse completes Handwriting: 100% legible  MOBILITY STATUS: Needs Assist: RW  POSTURE COMMENTS:  No Significant postural limitations  ACTIVITY TOLERANCE: Activity tolerance: 5 min standing tolerance   FUNCTIONAL OUTCOME MEASURES: FOTO: 98  pt demonstrates poor insight into deficits   UPPER EXTREMITY ROM:  WFL   UPPER EXTREMITY MMT:   WFL  HAND FUNCTION: Grip strength: Right: 60 lbs; Left: 42 lbs, Lateral pinch:  Right: 24 lbs, Left: 22 lbs, and 3 point pinch: Right: 20 lbs, Left: 16 lbs  COORDINATION: 9 Hole Peg test: Right: 30 sec; Left: 38 sec  SENSATION: WFL Not tested  EDEMA: none   COGNITION: Overall cognitive status: Impaired Completed clock draw with mildly impaired number spacing, unable to set correct times for minute/hand.  VISION: Subjective report:  Baseline vision: Wears glasses all the time Visual history:   VISION ASSESSMENT: To be further assessed in functional context  Patient has difficulty with following activities due to following visual impairments: L inattention Completed Albert's test with positive indicator for Unilateral Spatial Neglect - 10 lines on L lower/middle quadrant missed   OBSERVATIONS: Pt presents with spouse for OT evaluation.    TODAY'S TREATMENT:  DATE: 01/30/23   PATIENT EDUCATION: Education details: OT role, goals, POC Person educated: Spouse Education method: Explanation Education comprehension: verbalized understanding  HOME EXERCISE PROGRAM: FMC HEP to be initiated next session   GOALS: Goals reviewed with patient? Yes  SHORT TERM GOALS: Target date: 03/13/23  Pt and caregiver will IND complete HEP for improved grip strength and dexterity.  Baseline: no HEP Goal status: INITIAL    LONG TERM GOALS: Target date: 04/24/23  Pt will increase L grip strength by 10# to open jars. Baseline: L grip 42# Goal status: INITIAL  2.  Pt will increase activity tolerance for standing dynamic tasks to 15 min to assist with IADLs Baseline: 5 min standing Goal status: INITIAL  3.  Pt will IND don pants with no cues from caregiver for sequencing.  Baseline: MIN A to don pants Goal status: INITIAL  4.  Pt/caregiver will verbalize plan to implement x3 visual compensation strategies for improved I/ADL performance.   Baseline: none Goal status: INITIAL  5.  Pt will improve non-dominant LUE dexterity by 10 seconds for return to leisure activity of wood carving. Baseline: 9 hole peg test 38 sec Goal status: INITIAL   ASSESSMENT:  CLINICAL IMPRESSION: Patient is a 88 y.o. male who was seen today for occupational therapy evaluation to address safety and independence with I/ADLs. Pt is WFL for BUE AROM and MMT. Pt limited with non-dominant L grip, 3 point pinch strength, and FMC. Pt is significantly limited by short term memory deficits and L visual inattention. Per spouse - pt requires cognitive and SETUP assist for all I/ADLs 2/2 cognitive and visual deficits; however pt denies assist for any I/ADLs demonstrating poor insight into deficits. Will complete further visual assessment next session. Pt will benefit from OT to improve engagement of LUE during ADLs, increased tolerance for IADLs, and apply compensatory strategies to decreased caregiver burden.  PERFORMANCE DEFICITS: in functional skills including ADLs, IADLs, and dexterity, cognitive skills including attention, emotional, memory, orientation, problem solving, and safety awareness, and psychosocial skills including coping strategies and environmental adaptation.   IMPAIRMENTS: are limiting patient from ADLs, IADLs, and leisure.   CO-MORBIDITIES: has co-morbidities such as TBI  that affects occupational performance. Patient will benefit from skilled OT to address above impairments and improve overall function.  MODIFICATION OR ASSISTANCE TO COMPLETE EVALUATION: Min-Moderate modification of tasks or assist with assess necessary to complete an evaluation.  OT OCCUPATIONAL PROFILE AND HISTORY: Detailed assessment: Review of records and additional review of physical, cognitive, psychosocial history related to current functional performance.  CLINICAL DECISION MAKING: Moderate - several treatment options, min-mod task modification necessary  REHAB  POTENTIAL: Fair limited by cognition/STM  EVALUATION COMPLEXITY: Moderate    PLAN:  OT FREQUENCY: 1-2x/week  OT DURATION: 12 weeks  PLANNED INTERVENTIONS: self care/ADL training, therapeutic exercise, therapeutic activity, patient/family education, cognitive remediation/compensation, visual/perceptual remediation/compensation, energy conservation, and DME and/or AE instructions  RECOMMENDED OTHER SERVICES: SLP, PT  CONSULTED AND AGREED WITH PLAN OF CARE: Patient and family member/caregiver  PLAN FOR NEXT SESSION: initiate HEP   Kathie Dike, M.S. OTR/L  01/30/23, 1:09 PM  ascom [phone]  Presley Raddle, OT 01/30/2023, 9:57 AM

## 2023-01-30 NOTE — Therapy (Addendum)
OUTPATIENT PHYSICAL THERAPY EVALUATION  Patient Name: Clayton [name] MRN: [id number]  DOB: 1934/07/18, 88 y.o. male    END OF SESSION:.  PT End of Session - 01/30/23 1132     Visit Number 1    Number of Visits 16    Date for PT Re-Evaluation 04/01/23    Authorization Type [employer] PPO    Authorization Time Period 01/30/23-04/01/23    Progress Note Due on Visit 10    PT Start Time 1100    PT Stop Time 1130    PT Time Calculation (min) 30 min    Equipment Utilized During Treatment Gait belt    Activity Tolerance Patient tolerated treatment well;No increased pain    Behavior During Therapy [facility] for tasks assessed/performed            *SmartPhrase corrected 04/19/23 Past Medical History:  Diagnosis Date   Acute gastrointestinal hemorrhage    Acute hypoxemic respiratory failure (HCC) 05/25/2022   Acute thrombotic stroke (HCC) 12/14/2022   Basal cell carcinoma    Blood in stool 08/30/2018   Blood transfusion without reported diagnosis 76 (age 59)   After traumatic brain injury   Broken ribs    2 BROKEN ON RIBS   CKD (chronic kidney disease)    PT DENIES   Color blindness    DVT (deep venous thrombosis) (HCC)    RIGHT LEG   Dyslexia    Fracture dislocation of wrist    RIGHT   GI bleed 09/02/2018   Hiatal hernia with GERD    HTN (hypertension)    Hypokalemia 05/26/2022   Hyponatremia 05/25/2022   Symptomatic anemia 09/03/2018   TBI (traumatic brain injury) (HCC)    w/skull Fx and crani in 1960s    *SmartPhrase corrected 04/19/23 Past Surgical History:  Procedure Laterality Date   COLONOSCOPY  2014   cleared for 5 yrs   COLONOSCOPY N/A 09/01/2018   Procedure: COLONOSCOPY;  Surgeon: Pasty Spillers, MD;  Location: [facility] ENDOSCOPY;  Service: Endoscopy;  Laterality: N/A;   CRANIECTOMY FOR DEPRESSED SKULL FRACTURE  1960'S   ESOPHAGOGASTRODUODENOSCOPY N/A 09/01/2018   Procedure: ESOPHAGOGASTRODUODENOSCOPY (EGD);  Surgeon: Pasty Spillers, MD;  Location: [facility] ENDOSCOPY;  Service: Endoscopy;  Laterality: N/A;   HERNIA REPAIR     X2   IVC FILTER REMOVAL N/A 07/03/2017   Procedure: IVC FILTER REMOVAL;  Surgeon: Annice Needy, MD;  Location: [facility] INVASIVE CV LAB;  Service: Cardiovascular;  Laterality: N/A;   LUMBAR LAMINECTOMY/DECOMPRESSION MICRODISCECTOMY N/A 04/20/2020   Procedure: L2/3, L3/4 LAMINECTOMY;  Surgeon: Lucy Chris, MD;  Location: [facility] ORS;  Service: Neurosurgery;  Laterality: N/A;  2ND CASE   PERIPHERAL VASCULAR THROMBECTOMY Right 05/22/2017   Procedure: PERIPHERAL VASCULAR THROMBECTOMY;  Surgeon: Annice Needy, MD;  Location: [facility] INVASIVE CV LAB;  Service: Cardiovascular;  Laterality: Right;   SPINE SURGERY  December 2021   TONSILLECTOMY       PCP: Tresa Garter MD  REFERRING PROVIDER: Wynn Banker MD  REFERRING DIAG: CVA  Rationale for Evaluation and Treatment: Rehabilitation  THERAPY DIAG:  No diagnosis found.  ONSET DATE: 12/12/22  SUBJECTIVE:  SUBJECTIVE STATEMENT: Pt presents with wife, here for evaluation of ongoing CVA deficits. Has been seen by CIR then HHPT.   PERTINENT HISTORY:  Clayton [name] is an 87yoM admitted [facility] 12/12/22 for worsening weakness and cognitive impairment. Per MRI impression: "Scattered small acute to subacute infarcts in the right PCA distribution". Prior Level of Function : Independent/Modified Independent;Working/employed. Per wife: previously pt able to walk around without issue, Indepentdent without use of AD, however has a history of several falls in past 2 years which she attributes to his bilat sensory neuropathy. He has been largely noncompliant with RW recommendations since Feb 2024. Pt DC to inpatient rehab in [place], then home with HHPT. PMH cognitive impairment, TBI, HTN.  PAIN:  Are  you having pain? No  PRECAUTIONS: Fall   WEIGHT BEARING RESTRICTIONS: No  FALLS:  Has patient fallen in last 6 months? Yes. Number of falls: unsure  LIVING ENVIRONMENT: Lives with: Wife Stairs: Yes: 8 entry stairs with 1 railing  Has following equipment at home: Dan Humphreys - 2 wheeled  OCCUPATION: Radiographer, therapeutic" makes crafts and art   PLOF: Independent with basic ADLs  PATIENT GOALS: Wife desires improved safety and stamina in the home, falls avoidance   NEXT MD VISIT: unknown  OBJECTIVE:   PATIENT SURVEYS:  FOTO: not appropriate due to memory/awareness deficits   COGNITION: Overall cognitive status: memory/awareness deficits     SENSATION: Per wife: sensory neuropathy BLE   FUNCTIONAL TESTS:  -5xSTS: 14.76sec hands free, no LOB  - : 974ft (several LOB when turning), no device, needs cues for navigation due to memory deficits  -TUG: 9.88sec, no device, no LOB   Balance Assessment: -SLS: ~3 sec bilat (large amplitude righting, full body)  -eyes closed firm: 30sec, mild to no sway -eyes closed foam: ~20 sec (sway uncontrolled), heavy sway increase -Berg Balance Test: pending     TODAY'S TREATMENT:                                                                                                                              DATE: 01/30/23  Evaluation only     PATIENT EDUCATION:  Education details: Explained findings from examination, role of physical therapy in treating this, importance of compliance with program outside of clinic in order to achieve goals of care  Person educated: Patient, wife  Education method: We spoke about it Education comprehension: Pt verbalized understanding of message, however anticipate need to repeat and emphasize message due to empirical evidence that pt's only recall ~70% of education from their provider.   HOME EXERCISE PROGRAM: -Defer to visit 2 and/or completion of tests and measures  ASSESSMENT:  CLINICAL  IMPRESSION: Examination this date revealing in multiple areas of impairment, deficit, and hypofunction that provide a layer of objectivity to the patient's chief complaint- these values give insight to severity of dysfunction and potential associated secondary risks. Unfortunately pt has limited awareness of his deficits and limited ST memory which  decreases any potential value from his many LOB sustained within session. Discussed with wife how his cognitive impairments make his baseline balance deficits far more impactful. PT will effectively target these areas and bring resolution to these areas, bringing the highest level of achievement to goals of care- this done in the name of rehabilitating patient back to the closest level to PLOF as possible.   OBJECTIVE IMPAIRMENTS: Abnormal gait, cardiopulmonary status limiting activity, decreased activity tolerance, decreased balance, decreased coordination, decreased mobility, difficulty walking, decreased strength, impaired flexibility, impaired sensation, impaired vision/perception, improper body mechanics, and postural dysfunction.  ACTIVITY LIMITATIONS: carrying, lifting, bending, sitting, standing, squatting, stairs, transfers, bed mobility, dressing, reach overhead, hygiene/grooming, locomotion level, and caring for others  PARTICIPATION LIMITATIONS: meal prep, cleaning, laundry, personal finances, driving, shopping, community activity, occupation, and yard work  PERSONAL FACTORS: Age, Fitness, Past/current experiences, Time since onset of injury/illness/exacerbation, and 3+ comorbidities REHAB POTENTIAL: Good  CLINICAL DECISION MAKING: Evolving/moderate complexity  EVALUATION COMPLEXITY: moderate  SHORT TERM GOALS: Target date: 03/01/23  Patient to report compliance and success with home based program as prescribed by this site in order to help with pt self-efficacy in outcomes and symptoms management.  Baseline: HEP to be issued at visit 2 Goal  status: INITIAL  LONG TERM GOALS: Target date: 11/30  Pt to demonstrate improved power in legs, activity tolerance, and dynamic balance AEB improved 5xSTS and TUG.  Baseline: eval: 5xSTS hands free: 15+seconds; TUG c device in 9.86 sec Goal status: INITIAL  2.  Pt to demonstrate improved tolerance and distance in by >12% to indicate improved ability to perform tasks in community for IADL.   Baseline: eval: : 92ft, no device, several LOB with turning Goal status: INITIAL  3.  Pt to report more consistent use of 2-hand AD when performing AMB outside of the house.  Baseline: intemrittent, inconsistent use of DME when AMB out of house.  Goal status: INITIAL PLAN:  PT FREQUENCY: 2x/week  PT DURATION: 2 months   PLANNED INTERVENTIONS: Therapeutic exercises, Therapeutic activity, Neuromuscular re-education, Balance training, Gait training, Patient/Family education, Self Care, and Joint mobilization.  PLAN FOR NEXT SESSION: lots of dual tasking, BBT, AMB with RW for safety assessment [facility] 4WW?)    11:47 AM, 01/30/23 Rosamaria Lints, PT, DPT Physical Therapist - [facility]  Outpatient Physical Therapy- Main Campus [phone]     *addendum on 04/19/23 to correct broken smart phrases for PMH/PSH and typographical anomalies in subjective report.  8:22 AM, 04/19/23 Rosamaria Lints, PT, DPT Physical Therapist - [facility]  Outpatient Physical Therapy- Main Campus [phone]     *addendum on 04/25/23 to correct broken smart phrases for patient header information.   11:03 AM, 04/25/23 Rosamaria Lints, PT, DPT Physical Therapist -  [facility]  Outpatient Physical Therapy- Main Campus [phone]

## 2023-01-30 NOTE — Addendum Note (Signed)
Addended by: Rosamaria Lints on: 01/30/2023 01:23 PM   Modules accepted: Orders

## 2023-01-30 NOTE — Therapy (Signed)
OUTPATIENT SPEECH LANGUAGE PATHOLOGY  EVALUATION   Patient Name: Clayton [name] MRN: [id number] DOB:1934/08/20, 88 y.o., male Today's Date: 01/30/2023  PCP: Leonor Liv, MD REFERRING PROVIDER: Claudette Laws, MD   End of Session - 01/30/23 1308     Visit Number 1    Number of Visits 25    Date for SLP Re-Evaluation 04/24/23    Authorization Type [employer] Medicare Choice PPO    Authorization Time Period 01/30/2023 thru 03/31/2023    Authorization - Visit Number 1    Authorization - Number of Visits 18    Progress Note Due on Visit 10    SLP Start Time 0930    SLP Stop Time  1015    SLP Time Calculation (min) 45 min    Activity Tolerance Patient tolerated treatment well             No past medical history on file.  The histories are not reviewed yet. Please review them in the "History" navigator section and refresh this SmartLink. Patient Active Problem List   Diagnosis Date Noted   Insomnia due to medical condition 12/29/2022   Elevated low density lipoprotein (LDL) cholesterol level 12/29/2022   Generalized anxiety disorder 12/29/2022   GERD (gastroesophageal reflux disease) 12/29/2022   ARF (acute renal failure) (HCC) 12/29/2022   Stroke (cerebrum) (HCC) 12/15/2022   Acute thrombotic stroke (HCC) 12/14/2022   Acute delirium 12/14/2022   Overweight (BMI 25.0-29.9) 12/13/2022   Weakness 12/12/2022   Cognitive impairment 12/12/2022   SOB (shortness of breath) 06/02/2022   Acute respiratory failure with hypoxia (HCC) 05/25/2022   Multifocal pneumonia 05/24/2022   Falls 05/24/2022   Acute left-sided low back pain with left-sided sciatica 02/25/2020   Lumbar stenosis with neurogenic claudication 02/25/2020   Diverticulosis of colon with hemorrhage    Diverticulosis of small intestine without hemorrhage    HTN (hypertension) 08/30/2018   Deep vein thrombosis (DVT) of proximal vein of right lower extremity (HCC) 06/14/2017    ONSET DATE: pt's wife reports  gradual decline ~ 2 years ago, Cognitive Impairment initially dx on 12/12/2022; Right PCA CVA 12/12/2022; date of referral 01/16/2023   REFERRING DIAG: I63.531 (ICD-10-CM) - Acute right PCA stroke (HCC)  THERAPY DIAG:  Cognitive communication deficit  Dysarthria and anarthria  Dysphonia  Acute right PCA stroke (HCC)  Rationale for Evaluation and Treatment Rehabilitation  SUBJECTIVE:   SUBJECTIVE STATEMENT: Pt with flat affect, defers to wife as historian Pt accompanied by: significant other  PERTINENT HISTORY and DIAGNOSTIC FINDINGS: Pt is a 88 year old male with past medical history significant for TBI, hypertension, hyperlipidemia with multiple hospitalizations in January-February 2024 for pneumonia. While pt's wife reports gradual decrease in pt's vocal intensity and cognition, she reports singificant decline in memory and voice following this hospitalization (chart review reveals ambulatory referral made to neurology for concern regarding "mild cognitive impairment and dementia"). Pt seen by outpatient neurology on 06/28/2022 with MRI scheduled d/t concern with worsening memory loss. MRI 07/13/2022 - IMPRESSION: 1. No acute abnormality or reversible cause of memory loss. 2.  Cerebral atrophy. Pt with follow up appointment with Outpatient Neurology on 08/15/2022 with improved restlessness at night and mildly improved mood, ongiong unsteadiness while walking and unchanged memory deficits. Per chart, "discussed at length prognosis and safety concerns in setting of mild to moderate dementia." On 12/12/2022 pt presented to [facility] ED on 12/12/2022 with confusion. MRI confirmed (12/12/2022) FINDINGS: Small areas of restricted diffusion in the right occipital cortex, right hippocampus, and right  thalamus. Other than the occipital cortex, diffusion restriction is fairly weak. Symmetric diffusion hyperintensity along the periphery of the bilateral cerebellum is likely artifactual. Generalized cerebral  volume loss. No acute hemorrhage, hydrocephalus, mass, or collection. Mild chronic small vessel ischemia in the cerebral white matter. IMPRESSION: 1. Scattered small acute to subacute infarcts in the right PCA distribution 2. Generalized brain atrophy. [facility] until 12/28/2022. Pt with homehealth therapies in interm of Outpatient services. Follow up outpatient neurology on 01/17/2023 reveales worsening short and long term meory loss "memory evaluation today is 11/30."   This is in a largely unaided person with documented moderate bilateral sensory neural hearing loss (2015).   PAIN:  Are you having pain? No   FALLS: Has patient fallen in last 6 months?  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 Awareness: Impaired: Intellectual and International aid/development worker function: Impaired: Comment: all impaired by lower level deficits Behavior: flat affect, decreased initiation    AUDITORY COMPREHENSION: Overall auditory comprehension: Not tested - not formally tested d/t severely of hearing loss and inability to hear this Clinical research associate -    READING COMPREHENSION: not tested  EXPRESSION: verbal  VERBAL EXPRESSION: Level of generative/spontaneous verbalization: sentence Automatic speech: name: intact and social response: intact  Will require further testing once properly aided for hearing   WRITTEN EXPRESSION: Dominant hand: right   Written expression: Not  tested  MOTOR SPEECH: Overall motor speech: Appears intact Level of impairment: Sentence Respiration: diaphragmatic/abdominal breathing and speaking on residual capacity Phonation: breathy, hoarse, and low vocal intensity Resonance: WFL Articulation: Appears intact Intelligibility: Intelligibility reduced Motor planning: Appears intact Interfering components: hearing loss Effective technique: increased vocal intensity  ORAL MOTOR EXAMINATION: Facial : WFL Lingual: WFL Velum: WFL Mandible: WFL Cough: WFL Voice: Breathy, Weak   STANDARDIZED ASSESSMENTS: Attempted initial assessment with [place]'s Cognitive Examination - ACE III Discontinued as pt unable to hear this Clinical research associate for accurate assessment of cognition vs hearing impairment.     PATIENT REPORTED OUTCOME MEASURES (PROM): To be completed over next 3 sessions   TODAY'S TREATMENT:  Skilled verbal education provided on need to wear hearing aids all day everyday to increase pt's prognosis for recovery of function as well as for prevention of further cognitive decline.   PATIENT EDUCATION: Education details: need to wear hearing aids, stroke recovery, dementia and cognition, ST POC Person educated: Patient and Spouse Education method: Explanation Education comprehension: needs further education  HOME EXERCISE PROGRAM:        Wear hearing aids    GOALS:  Goals reviewed with patient? Yes  SHORT TERM GOALS: Target date: 10 sessions  Pt will complete formal cognitive communication evaluation.  Baseline: Goal status: INITIAL   2.  Pt will refer to external memory aids for accurate recall of orientation information in 6 out of 7 opportunities.  Baseline: oriented to self and town Goal status: INITIAL  3.  With Min A, pt will complete Expiratory Muscle Training (EMT) across 30 trials i at 50-70% of MEP improve coordination of respiration and phonation and maximize breath  support for speech. Baseline:  Goal  status: INITIAL  4.  With Moderate assistance, pt will utilize speech intelligibility strategies to achieve > 80% speech intelligibility at the sentence level.  Baseline:  Goal status: INITIAL   LONG TERM GOALS: Target date: 04/24/2023  With Min A, patient will use external aids to manage appointments, chores, shopping, and log vital signs.    Baseline:  Goal status: INITIAL  2.  Pt will report improved cognitive communication via PROM by 5 points at last ST session   Baseline:  Goal status: INITIAL  3.  Pt will complete HEP with moderate assistance across 5 out of 7 opportunities.  Baseline:  Goal status: INITIAL   ASSESSMENT:  CLINICAL IMPRESSION:  Patient is a 88 y.o. male who was seen today for a cognitive communication evaluation following a right PCA CVA in the setting of pre-existing cognitive decline and TBI as well as chronically unaided moderate bilateral sensorineural hearing loss.  Formal cognitive evaluation will be completed during next appt as pt has hearing aids but doesn't have them in today. Pt with difficulty hearing this Clinical research associate throughout evaluation. Of note, he has an audiology appt tomorrow for assessment and reprogramming of hearing aids.      Functionally, pt presents with significant memory loss and is oriented to himself, his wife, day of week, current town, county and country.   OBJECTIVE IMPAIRMENTS include attention, memory, awareness, executive functioning, and dysarthria. These impairments are limiting patient from managing medications, managing appointments, managing finances, household responsibilities, ADLs/IADLs, and effectively communicating at home and in community. Factors affecting potential to achieve goals and functional outcome are ability to learn/carryover information, co-morbidities, previous level of function, severity of impairments, and unaided hearing loss .Marland Kitchen Patient will benefit from skilled SLP services to address above impairments and  improve overall function.  REHAB POTENTIAL: Fair pre-existing cognitive decline, chronically unaided hearing loss, severity of memory loss at present  PLAN: SLP FREQUENCY: 1-2x/week  SLP DURATION: 12 weeks  PLANNED INTERVENTIONS: Environmental controls, Cueing hierachy, Cognitive reorganization, Internal/external aids, Functional tasks, SLP instruction and feedback, Compensatory strategies, and Patient/family education    Cherylyn Sundby B. Dreama Saa, M.S., CCC-SLP, Tree surgeon Certified Brain Injury Specialist [facility]  [facility] Rehabilitation Services Office [phone] Ascom [phone] Fax [phone]

## 2023-01-31 DIAGNOSIS — H903 Sensorineural hearing loss, bilateral: Secondary | ICD-10-CM | POA: Diagnosis not present

## 2023-02-02 ENCOUNTER — Ambulatory Visit: Payer: Medicare PPO | Attending: Physical Medicine & Rehabilitation

## 2023-02-02 ENCOUNTER — Ambulatory Visit: Payer: Medicare PPO | Admitting: Speech Pathology

## 2023-02-02 ENCOUNTER — Ambulatory Visit: Payer: Medicare PPO | Admitting: Occupational Therapy

## 2023-02-02 DIAGNOSIS — M6281 Muscle weakness (generalized): Secondary | ICD-10-CM | POA: Diagnosis not present

## 2023-02-02 DIAGNOSIS — I6389 Other cerebral infarction: Secondary | ICD-10-CM | POA: Insufficient documentation

## 2023-02-02 DIAGNOSIS — R296 Repeated falls: Secondary | ICD-10-CM | POA: Diagnosis not present

## 2023-02-02 DIAGNOSIS — R41841 Cognitive communication deficit: Secondary | ICD-10-CM | POA: Insufficient documentation

## 2023-02-02 DIAGNOSIS — R278 Other lack of coordination: Secondary | ICD-10-CM | POA: Diagnosis not present

## 2023-02-02 DIAGNOSIS — R262 Difficulty in walking, not elsewhere classified: Secondary | ICD-10-CM | POA: Insufficient documentation

## 2023-02-02 DIAGNOSIS — R2681 Unsteadiness on feet: Secondary | ICD-10-CM | POA: Diagnosis not present

## 2023-02-02 NOTE — Therapy (Addendum)
OUTPATIENT PHYSICAL THERAPY TREATMENT  Patient Name: [name] MRN: [id number] DOB:February 14, 1935, 88 y.o., male Today's Date: 02/02/2023  END OF SESSION:.     PT End of Session - 02/02/23 0807       Visit Number 2     Number of Visits 16     Date for PT Re-Evaluation 04/01/23     Authorization Type [employer] PPO     Authorization Time Period 01/30/23-04/01/23     Progress Note Due on Visit 10     PT Start Time 0803     PT Stop Time 0843     PT Time Calculation (min) 40 min     Equipment Utilized During Treatment Gait belt     Activity Tolerance Patient tolerated treatment well;No increased pain     Behavior During Therapy [facility] for tasks assessed/performed   Past Medical History:  Diagnosis Date   Acute gastrointestinal hemorrhage     Acute hypoxemic respiratory failure (HCC) 05/25/2022   Acute thrombotic stroke (HCC) 12/14/2022   Basal cell carcinoma     Blood in stool 08/30/2018   Blood transfusion without reported diagnosis 62 (age 59)    After traumatic brain injury   Broken ribs      2 BROKEN ON RIBS   CKD (chronic kidney disease)      PT DENIES   Color blindness     DVT (deep venous thrombosis) (HCC)      RIGHT LEG   Dyslexia     Fracture dislocation of wrist      RIGHT   GI bleed 09/02/2018   Hiatal hernia with GERD     HTN (hypertension)     Hypokalemia 05/26/2022   Hyponatremia 05/25/2022   Symptomatic anemia 09/03/2018   TBI (traumatic brain injury) (HCC)      w/skull Fx and crani in 1960s             Past Surgical History:  Procedure Laterality Date   COLONOSCOPY   2014    cleared for 5 yrs   COLONOSCOPY N/A 09/01/2018    Procedure: COLONOSCOPY;  Surgeon: Pasty Spillers, MD;  Location: [facility] ENDOSCOPY;  Service: Endoscopy;  Laterality: N/A;   CRANIECTOMY FOR DEPRESSED SKULL FRACTURE   1960'S   ESOPHAGOGASTRODUODENOSCOPY N/A 09/01/2018    Procedure: ESOPHAGOGASTRODUODENOSCOPY (EGD);  Surgeon: Pasty Spillers,  MD;  Location: [facility] ENDOSCOPY;  Service: Endoscopy;  Laterality: N/A;   HERNIA REPAIR        X2   IVC FILTER REMOVAL N/A 07/03/2017    Procedure: IVC FILTER REMOVAL;  Surgeon: Annice Needy, MD;  Location: [facility] INVASIVE CV LAB;  Service: Cardiovascular;  Laterality: N/A;   LUMBAR LAMINECTOMY/DECOMPRESSION MICRODISCECTOMY N/A 04/20/2020    Procedure: L2/3, L3/4 LAMINECTOMY;  Surgeon: Lucy Chris, MD;  Location: [facility] ORS;  Service: Neurosurgery;  Laterality: N/A;  2ND CASE   PERIPHERAL VASCULAR THROMBECTOMY Right 05/22/2017    Procedure: PERIPHERAL VASCULAR THROMBECTOMY;  Surgeon: Annice Needy, MD;  Location: [facility] INVASIVE CV LAB;  Service: Cardiovascular;  Laterality: Right;   SPINE SURGERY   December 2021   TONSILLECTOMY                Patient Active Problem List    Diagnosis Date Noted   Insomnia due to medical condition 12/29/2022   Elevated low density lipoprotein (LDL) cholesterol level 12/29/2022   Generalized anxiety disorder 12/29/2022   GERD (gastroesophageal reflux disease) 12/29/2022   ARF (acute renal failure) (HCC) 12/29/2022  Stroke (cerebrum) (HCC) 12/15/2022   Acute thrombotic stroke (HCC) 12/14/2022   Acute delirium 12/14/2022   Overweight (BMI 25.0-29.9) 12/13/2022   Weakness 12/12/2022   Cognitive impairment 12/12/2022   SOB (shortness of breath) 06/02/2022   Acute respiratory failure with hypoxia (HCC) 05/25/2022   Multifocal pneumonia 05/24/2022   Falls 05/24/2022   Acute left-sided low back pain with left-sided sciatica 02/25/2020   Lumbar stenosis with neurogenic claudication 02/25/2020   Diverticulosis of colon with hemorrhage     Diverticulosis of small intestine without hemorrhage     HTN (hypertension) 08/30/2018   Deep vein thrombosis (DVT) of proximal vein of right lower extremity (HCC) 06/14/2017     PCP: Tresa Garter MD  REFERRING PROVIDER: Wynn Banker MD  REFERRING DIAG: CVA  Rationale for Evaluation and Treatment: Rehabilitation  THERAPY DIAG:   No diagnosis found.  ONSET DATE: 12/12/22  SUBJECTIVE:                                                                                                                                                                                           SUBJECTIVE STATEMENT: Patient denies any new issues and states no falls since initial evaluation.    PERTINENT HISTORY:  Clayton [name] is a 88 y.o. male with medical history significant of cognitive impairment, history of TBI, hypertension presenting with weakness, confusion. Admitted to [facility] 12/12/22 for worsening weakness and cognitive impairment. Per MRI impression: "Scattered small acute to subacute infarcts in the right PCA distribution". Prior Level of Function : Independent/Modified Independent;Working/employed. Per wife: pt able to walk around without issue, Indepentdent without use of AD, however has a history of severla falls in past 2 years which she attributes to his bilat sensory neuropathy. He has been largely noncompliant with RW recommendations since Feb 2024. Pt DC to inpatient rehab in [place], then home with HHPT.   PAIN:  Are you having pain? No  PRECAUTIONS: Fall   WEIGHT BEARING RESTRICTIONS: No  FALLS:  Has patient fallen in last 6 months? Yes. Number of falls: unsure  LIVING ENVIRONMENT: Lives with: Wife Stairs: Yes: 8 entry stairs with 1 railing  Has following equipment at home: Dan Humphreys - 2 wheeled  OCCUPATION: Radiographer, therapeutic" makes crafts and art   PLOF: Independent with basic ADLs  PATIENT GOALS: Wife desires improved safety and stamina in the home, falls avoidance   NEXT MD VISIT: unknown  OBJECTIVE:   PATIENT SURVEYS:  FOTO: not appropriate due to memory/awareness deficits   COGNITION: Overall cognitive status: memory/awareness deficits     SENSATION: Per wife: sensory neuropathy BLE   FUNCTIONAL TESTS:  -5xSTS: 14.76sec hands free, no  LOB  - : 929ft (several LOB when turning), no device, needs  cues for navigation due to memory deficits  -TUG: 9.88sec, no device, no LOB   Balance Assessment: -SLS: ~3 sec bilat (large amplitude righting, full body)  -eyes closed firm: 30sec, mild to no sway -eyes closed foam: ~20 sec (sway uncontrolled), heavy sway increase -Berg Balance Test: pending     TODAY'S TREATMENT:                                                                                                                              DATE: 04/21/23     THEREX:    Sit to stand without UE Support (VC for scooting out to edge of seat) and for anterior weight shift- 10 reps (Patient reports as tiring)  Standing hip march- 10 reps each LE Standing hip abd- 10 reps each LE (VC to slow down for optimal muscle control) Standing calf raises- 10 reps with 3 sec hold   NMR:    BERG= 42/56    PATIENT EDUCATION:  Education details: Purpose of BBT; introduction to HEP for general LE strengthening Person educated: Patient, wife  Education method: We spoke about it Education comprehension: Pt verbalized understanding of message, however anticipate need to repeat and emphasize message due to empirical evidence that pt's only recall ~70% of education from their provider.   HOME EXERCISE PROGRAM: Access Code: [email or web address] URL: [email or web address] Date: 02/02/2023 Prepared by: Maureen Ralphs  Exercises - Sit to Stand with Arms Crossed  - 3 x weekly - 3 sets - 10 reps - Standing Hip Abduction with Counter Support  - 3 x weekly - 3 sets - 10 reps - Standing Heel Raises  - 3 x weekly - 3 sets - 10 reps - 3 hold  ASSESSMENT:  CLINICAL IMPRESSION: Patient presents with wife today- some cognitive impairments so educated wife on HEP as well in hopes of best compliance. Agreed with initial assessment that patient has poor insight to impairments but able to redirect and he was able to follow most verbal cues today. He and his wife were instructed in some basic LE  strengthening exericses that could be performed safely in the home and he will benefit from continuing therex and adding to HEP as appropriate. He demonstrated impaired dynamic balance today scoring 42/56 on BERG. Advised both patient and wife that he should continue to use walker based on current score for safety. Patient will benefit from continued skilled PT services to improve his overall functional LE strength and mobility to decrease fall risk and improve overall quality of life.   OBJECTIVE IMPAIRMENTS: Abnormal gait, cardiopulmonary status limiting activity, decreased activity tolerance, decreased balance, decreased coordination, decreased mobility, difficulty walking, decreased strength, impaired flexibility, impaired sensation, impaired vision/perception, improper body mechanics, and postural dysfunction.  ACTIVITY LIMITATIONS: carrying, lifting, bending, sitting, standing, squatting, stairs, transfers, bed mobility, dressing, reach overhead, hygiene/grooming, locomotion level, and caring for others  PARTICIPATION LIMITATIONS: meal prep,  cleaning, laundry, personal finances, driving, shopping, community activity, occupation, and yard work  PERSONAL FACTORS: Age, Fitness, Past/current experiences, Time since onset of injury/illness/exacerbation, and 3+ comorbidities REHAB POTENTIAL: Good  CLINICAL DECISION MAKING: Evolving/moderate complexity  EVALUATION COMPLEXITY: moderate  SHORT TERM GOALS: Target date: 03/01/23  Patient to report compliance and success with home based program as prescribed by this site in order to help with pt self-efficacy in outcomes and symptoms management.  Baseline: HEP to be issued at visit 2 Goal status: INITIAL    LONG TERM GOALS: Target date: 11/30  Pt to demonstrate improved power in legs, activity tolerance, and dynamic balance AEB improved 5xSTS and TUG.  Baseline: eval: 5xSTS hands free: 15+seconds; TUG c device in 9.86 sec Goal status: INITIAL  2.   Pt to demonstrate improved tolerance and distance in by >12% to indicate improved ability to perform tasks in community for IADL.   Baseline: eval: : 984ft, no device, several LOB with turning Goal status: INITIAL  3.  Pt to report more consistent use of 2-hand AD wen performing AMB outside of the house.  Baseline: intemrittent, inconsistent use of DME when AMB out of house.  Goal status: INITIAL PLAN:  PT FREQUENCY: 2x/week  PT DURATION: 2 months   PLANNED INTERVENTIONS: Therapeutic exercises, Therapeutic activity, Neuromuscular re-education, Balance training, Gait training, Patient/Family education, Self Care, and Joint mobilization.  PLAN FOR NEXT SESSION: dual tasking, AMB with RW , progress therex and balance training as appropriate.    7:28 PM, 04/21/23 Louis Meckel, PT Physical Therapist - [facility]  Outpatient Physical Therapy- Main [facility] [phone]   '

## 2023-02-02 NOTE — Therapy (Signed)
OUTPATIENT OCCUPATIONAL THERAPY NEURO TREATMENT NOTE  Patient Name: SUNAO FAIRWEATHER MRN: [id number] DOB:Jun 25, 1934, 88 y.o., male Today's Date: 02/02/2023  PCP: Leonor Liv  REFERRING PROVIDER: Clydie Braun   END OF SESSION:  OT End of Session - 02/02/23 1040     Visit Number 2    Number of Visits 24    Date for OT Re-Evaluation 04/24/23    OT Start Time 0845    OT Stop Time 0930    OT Time Calculation (min) 45 min    Activity Tolerance Patient tolerated treatment well    Behavior During Therapy [facility] for tasks assessed/performed              Past Medical History:  Diagnosis Date   Acute gastrointestinal hemorrhage    Acute hypoxemic respiratory failure (HCC) 05/25/2022   Acute thrombotic stroke (HCC) 12/14/2022   Basal cell carcinoma    Blood in stool 08/30/2018   Blood transfusion without reported diagnosis 66 (age 52)   After traumatic brain injury   Broken ribs    2 BROKEN ON RIBS   CKD (chronic kidney disease)    PT DENIES   Color blindness    DVT (deep venous thrombosis) (HCC)    RIGHT LEG   Dyslexia    Fracture dislocation of wrist    RIGHT   GI bleed 09/02/2018   Hiatal hernia with GERD    HTN (hypertension)    Hypokalemia 05/26/2022   Hyponatremia 05/25/2022   Symptomatic anemia 09/03/2018   TBI (traumatic brain injury) (HCC)    w/skull Fx and crani in 1960s   Past Surgical History:  Procedure Laterality Date   COLONOSCOPY  2014   cleared for 5 yrs   COLONOSCOPY N/A 09/01/2018   Procedure: COLONOSCOPY;  Surgeon: Pasty Spillers, MD;  Location: [facility] ENDOSCOPY;  Service: Endoscopy;  Laterality: N/A;   CRANIECTOMY FOR DEPRESSED SKULL FRACTURE  1960'S   ESOPHAGOGASTRODUODENOSCOPY N/A 09/01/2018   Procedure: ESOPHAGOGASTRODUODENOSCOPY (EGD);  Surgeon: Pasty Spillers, MD;  Location: [facility] ENDOSCOPY;  Service: Endoscopy;  Laterality: N/A;   HERNIA REPAIR     X2   IVC FILTER REMOVAL N/A 07/03/2017   Procedure: IVC FILTER REMOVAL;   Surgeon: Annice Needy, MD;  Location: [facility] INVASIVE CV LAB;  Service: Cardiovascular;  Laterality: N/A;   LUMBAR LAMINECTOMY/DECOMPRESSION MICRODISCECTOMY N/A 04/20/2020   Procedure: L2/3, L3/4 LAMINECTOMY;  Surgeon: Lucy Chris, MD;  Location: [facility] ORS;  Service: Neurosurgery;  Laterality: N/A;  2ND CASE   PERIPHERAL VASCULAR THROMBECTOMY Right 05/22/2017   Procedure: PERIPHERAL VASCULAR THROMBECTOMY;  Surgeon: Annice Needy, MD;  Location: [facility] INVASIVE CV LAB;  Service: Cardiovascular;  Laterality: Right;   SPINE SURGERY  December 2021   TONSILLECTOMY     Patient Active Problem List   Diagnosis Date Noted   Insomnia due to medical condition 12/29/2022   Elevated low density lipoprotein (LDL) cholesterol level 12/29/2022   Generalized anxiety disorder 12/29/2022   GERD (gastroesophageal reflux disease) 12/29/2022   ARF (acute renal failure) (HCC) 12/29/2022   Stroke (cerebrum) (HCC) 12/15/2022   Acute thrombotic stroke (HCC) 12/14/2022   Acute delirium 12/14/2022   Overweight (BMI 25.0-29.9) 12/13/2022   Weakness 12/12/2022   Cognitive impairment 12/12/2022   SOB (shortness of breath) 06/02/2022   Acute respiratory failure with hypoxia (HCC) 05/25/2022   Multifocal pneumonia 05/24/2022   Falls 05/24/2022   Acute left-sided low back pain with left-sided sciatica 02/25/2020   Lumbar stenosis with neurogenic claudication 02/25/2020   Diverticulosis of  colon with hemorrhage    Diverticulosis of small intestine without hemorrhage    HTN (hypertension) 08/30/2018   Deep vein thrombosis (DVT) of proximal vein of right lower extremity (HCC) 06/14/2017    ONSET DATE: 12/12/22  REFERRING DIAG: CVA  THERAPY DIAG:  Muscle weakness (generalized)  Other lack of coordination  Rationale for Evaluation and Treatment: Rehabilitation  SUBJECTIVE:   SUBJECTIVE STATEMENT:  Pt. Reports no pain today Pt accompanied by: significant other  PERTINENT HISTORY: YUTARO RINEHART is a 88  y.o. male with medical history significant of cognitive impairment, history of TBI, hypertension presenting with weakness, confusion. Admitted to [facility] 12/12/22 for worsening weakness and cognitive impairment. Per MRI impression: "Scattered small acute to subacute infarcts in the right PCA distribution". Completed inpatient rehab stay from 12/15/22 to 12/28/22.   PRECAUTIONS: Fall  WEIGHT BEARING RESTRICTIONS: No  PAIN:  Are you having pain? No  FALLS: Has patient fallen in last 6 months? Yes. 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  OBJECTIVE:   HAND DOMINANCE: Right  ADLs: Overall ADLs: MIN A + cues for initiation  Transfers/ambulation related to ADLs: SUPERVISION with RW Eating: SETUP Grooming: SETUP + cues for initiation  UB Dressing: SETUP LB Dressing: MIN A Toileting: cueing Bathing: MIN A  Tub Shower transfers: MOD A  Equipment: Shower seat with back  IADLs: Shopping: spouse completes Light housekeeping: spouse completes Meal Prep: spouse completes Community mobility: spouse completes Medication management: spouse completes Landscape architect: spouse completes Handwriting: 100% legible  MOBILITY STATUS: Needs Assist: RW  POSTURE COMMENTS:  No Significant postural limitations  ACTIVITY TOLERANCE: Activity tolerance: 5 min standing tolerance   FUNCTIONAL OUTCOME MEASURES: FOTO: 98  pt demonstrates poor insight into deficits   UPPER EXTREMITY ROM:  WFL   UPPER EXTREMITY MMT:   WFL  HAND FUNCTION: Grip strength: Right: 60 lbs; Left: 42 lbs, Lateral pinch: Right: 24 lbs, Left: 22 lbs, and 3 point pinch: Right: 20 lbs, Left: 16  lbs  COORDINATION: 9 Hole Peg test: Right: 30 sec; Left: 38 sec  SENSATION: WFL Not tested  EDEMA: none   COGNITION: Overall cognitive status: Impaired Completed clock draw with mildly impaired number spacing, unable to set correct times for minute/hand.  VISION: Subjective report:  Baseline vision: Wears glasses all the time Visual history:   VISION ASSESSMENT: To be further assessed in functional context  Patient has difficulty with following activities due to following visual impairments: L inattention Completed Albert's test with positive indicator for Unilateral Spatial Neglect - 10 lines on L lower/middle quadrant missed   OBSERVATIONS: Pt presents with spouse for OT evaluation.    TODAY'S TREATMENT:  DATE: 02/02/2023  Therapeutic Ex:  Pt. performed left gross gripping with a gross grip strengthener. Pt. worked on sustaining grip while grasping pegs and reaching to the left at various heights to place them into a container. The gripper was set to 17.9# of grip strength resistance. Pt. worked on pinch strengthening in the left hand for lateral, and 3pt. pinch using yellow, red, green, blue, and black resistive clips. Pt. worked on placing the clips at specific numbers on a yard stick placed to the left at vertical and horizontal angles. Tactile and verbal cues were required for eliciting the desired movement for digit position for 3pt., and lateral pinch position on the cilps. Pt. worked on grasping the clips by color from an assortment of clips placed randomly, and to the left at the tabletop surface to encourage visual scanning to the left, and left sided awareness.     PATIENT EDUCATION: Education details:left sided awareness,  lateral, and 3pt. Pinch position Person educated: Spouse Education method: Explanation Education comprehension:  verbalized understanding  HOME EXERCISE PROGRAM: [facility] HEP to be initiated next session   GOALS: Goals reviewed with patient? Yes  SHORT TERM GOALS: Target date: 03/13/23  Pt and caregiver will IND complete HEP for improved grip strength and dexterity.  Baseline: no HEP Goal status: INITIAL    LONG TERM GOALS: Target date: 04/24/23  Pt will increase L grip strength by 10# to open jars. Baseline: L grip 42# Goal status: INITIAL  2.  Pt will increase activity tolerance for standing dynamic tasks to 15 min to assist with IADLs Baseline: 5 min standing Goal status: INITIAL  3.  Pt will IND don pants with no cues from caregiver for sequencing.  Baseline: MIN A to don pants Goal status: INITIAL  4.  Pt/caregiver will verbalize plan to implement x3 visual compensation strategies for improved I/ADL performance.  Baseline: none Goal status: INITIAL  5.  Pt will improve non-dominant LUE dexterity by 10 seconds for return to leisure activity of wood carving. 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  Pt. was able to recall the process he used to place inlay  the small pieces of turquoise into the wood.  Pt. Was able to perform lateral, and 3pt. pinch strengthening, and with cues for hand position on the clips. Pt. Was able to accurately locate the correct clips scattered randomly to the left  on the tabletop surface. Pt. Was able to visually scan to the left to place the clips on the designated number 90% of the time when challenged to the left. Pt. was able to reach out  to place 100% of the pegs in the container while using the left hand grip strengthener. Pt. required cues at time for task initiation. Pt continues to benefit from OT to improve engagement of LUE during ADLs, increased tolerance for IADLs, and apply compensatory strategies to decreased  caregiver burden.  PERFORMANCE DEFICITS: in functional skills including ADLs, IADLs, and dexterity, cognitive skills including attention, emotional, memory, orientation, problem solving, and safety awareness, and psychosocial skills including coping strategies and environmental adaptation.   IMPAIRMENTS: are limiting patient from ADLs, IADLs, and leisure.   CO-MORBIDITIES: has co-morbidities such as TBI  that affects occupational performance. Patient will benefit from skilled OT to address above impairments and improve overall function.  MODIFICATION OR ASSISTANCE TO COMPLETE EVALUATION: Min-Moderate  modification of tasks or assist with assess necessary to complete an evaluation.  OT OCCUPATIONAL PROFILE AND HISTORY: Detailed assessment: Review of records and additional review of physical, cognitive, psychosocial history related to current functional performance.  CLINICAL DECISION MAKING: Moderate - several treatment options, min-mod task modification necessary  REHAB POTENTIAL: Fair limited by cognition/STM  EVALUATION COMPLEXITY: Moderate    PLAN:  OT FREQUENCY: 1-2x/week  OT DURATION: 12 weeks  PLANNED INTERVENTIONS: self care/ADL training, therapeutic exercise, therapeutic activity, patient/family education, cognitive remediation/compensation, visual/perceptual remediation/compensation, energy conservation, and DME and/or AE instructions  RECOMMENDED OTHER SERVICES: SLP, PT  CONSULTED AND AGREED WITH PLAN OF CARE: Patient and family member/caregiver  PLAN FOR NEXT SESSION: initiate HEP   Olegario Messier, MS, OTR/L 02/02/2023, 10:41 AM

## 2023-02-03 ENCOUNTER — Other Ambulatory Visit: Payer: Self-pay | Admitting: Physical Medicine and Rehabilitation

## 2023-02-03 ENCOUNTER — Ambulatory Visit: Payer: Medicare PPO | Admitting: Family Medicine

## 2023-02-07 ENCOUNTER — Ambulatory Visit: Payer: Medicare PPO | Admitting: Family Medicine

## 2023-02-07 ENCOUNTER — Ambulatory Visit: Payer: Medicare PPO | Admitting: Speech Pathology

## 2023-02-07 ENCOUNTER — Ambulatory Visit: Payer: Medicare PPO | Admitting: Occupational Therapy

## 2023-02-07 DIAGNOSIS — M6281 Muscle weakness (generalized): Secondary | ICD-10-CM | POA: Diagnosis not present

## 2023-02-07 DIAGNOSIS — I6389 Other cerebral infarction: Secondary | ICD-10-CM | POA: Diagnosis not present

## 2023-02-07 DIAGNOSIS — R2681 Unsteadiness on feet: Secondary | ICD-10-CM | POA: Diagnosis not present

## 2023-02-07 DIAGNOSIS — R296 Repeated falls: Secondary | ICD-10-CM | POA: Diagnosis not present

## 2023-02-07 DIAGNOSIS — R41841 Cognitive communication deficit: Secondary | ICD-10-CM | POA: Diagnosis not present

## 2023-02-07 DIAGNOSIS — R262 Difficulty in walking, not elsewhere classified: Secondary | ICD-10-CM | POA: Diagnosis not present

## 2023-02-07 DIAGNOSIS — R278 Other lack of coordination: Secondary | ICD-10-CM | POA: Diagnosis not present

## 2023-02-07 NOTE — Therapy (Signed)
OUTPATIENT SPEECH LANGUAGE PATHOLOGY  TREATMENT NOTE   Patient Name: Clayton [name] MRN: [id number] DOB:[birth year], 88 y.o., male Today's Date: 02/07/2023  PCP: Leonor Liv, MD REFERRING PROVIDER: Claudette Laws, MD   End of Session - 02/07/23 0940     Visit Number 2    Number of Visits 25    Date for SLP Re-Evaluation 04/24/23    Authorization Type [employer] PPO    Authorization Time Period 01/30/2023 thru 03/31/2023    Authorization - Visit Number 2    Authorization - Number of Visits 18    Progress Note Due on Visit 10    SLP Start Time 0930    SLP Stop Time  1015    SLP Time Calculation (min) 45 min    Activity Tolerance Patient tolerated treatment well             No past medical history on file.  The histories are not reviewed yet. Please review them in the "History" navigator section and refresh this SmartLink. Patient Active Problem List   Diagnosis Date Noted   Insomnia due to medical condition 12/29/2022   Elevated low density lipoprotein (LDL) cholesterol level 12/29/2022   Generalized anxiety disorder 12/29/2022   GERD (gastroesophageal reflux disease) 12/29/2022   ARF (acute renal failure) (HCC) 12/29/2022   Stroke (cerebrum) (HCC) 12/15/2022   Acute thrombotic stroke (HCC) 12/14/2022   Acute delirium 12/14/2022   Overweight (BMI 25.0-29.9) 12/13/2022   Weakness 12/12/2022   Cognitive impairment 12/12/2022   SOB (shortness of breath) 06/02/2022   Acute respiratory failure with hypoxia (HCC) 05/25/2022   Multifocal pneumonia 05/24/2022   Falls 05/24/2022   Acute left-sided low back pain with left-sided sciatica 02/25/2020   Lumbar stenosis with neurogenic claudication 02/25/2020   Diverticulosis of colon with hemorrhage    Diverticulosis of small intestine without hemorrhage    HTN (hypertension) 08/30/2018   Deep vein thrombosis (DVT) of proximal vein of right lower extremity (HCC) 06/14/2017    ONSET DATE: pt's wife reports  gradual decline ~ 2 years ago, Cognitive Impairment initially dx on 12/12/2022; Right PCA CVA 12/12/2022; date of referral 01/16/2023   REFERRING DIAG: I63.531 (ICD-10-CM) - Acute right PCA stroke (HCC)  THERAPY DIAG:  Cognitive communication deficit  Cerebrovascular accident (CVA) due to other mechanism (HCC)  Rationale for Evaluation and Treatment Rehabilitation  SUBJECTIVE:   SUBJECTIVE STATEMENT: Pt with flat affect, defers to wife as historian Pt accompanied by: significant other  PERTINENT HISTORY and DIAGNOSTIC FINDINGS: Pt is a 88 year old male with past medical history significant for TBI, hypertension, hyperlipidemia with multiple hospitalizations in January-February 2024 for pneumonia. While pt's wife reports gradual decrease in pt's vocal intensity and cognition, she reports singificant decline in memory and voice following this hospitalization (chart review reveals ambulatory referral made to neurology for concern regarding "mild cognitive impairment and dementia"). Pt seen by outpatient neurology on 06/28/2022 with MRI scheduled d/t concern with worsening memory loss. MRI 07/13/2022 - IMPRESSION: 1. No acute abnormality or reversible cause of memory loss. 2.  Cerebral atrophy. Pt with follow up appointment with Outpatient Neurology on 08/15/2022 with improved restlessness at night and mildly improved mood, ongiong unsteadiness while walking and unchanged memory deficits. Per chart, "discussed at length prognosis and safety concerns in setting of mild to moderate dementia." On 12/12/2022 pt presented to [facility] ED on 12/12/2022 with confusion. MRI confirmed (12/12/2022) FINDINGS: Small areas of restricted diffusion in the right occipital cortex, right hippocampus, and right thalamus. Other  than the occipital cortex, diffusion restriction is fairly weak. Symmetric diffusion hyperintensity along the periphery of the bilateral cerebellum is likely artifactual. Generalized cerebral volume  loss. No acute hemorrhage, hydrocephalus, mass, or collection. Mild chronic small vessel ischemia in the cerebral white matter. IMPRESSION: 1. Scattered small acute to subacute infarcts in the right PCA distribution 2. Generalized brain atrophy. [facility] until 12/28/2022. Pt with homehealth therapies in interm of Outpatient services. Follow up outpatient neurology on 01/17/2023 reveales worsening short and long term meory loss "memory evaluation today is 11/30."   This is in a largely unaided person with documented moderate bilateral sensory neural hearing loss (2015).   PAIN:  Are you having pain? No   FALLS: Has patient fallen in last 6 months?  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 Awareness: Impaired: Intellectual and International aid/development worker function: Impaired: Comment: all impaired by lower level deficits Behavior: flat affect, decreased initiation    AUDITORY COMPREHENSION: Overall auditory comprehension: Not tested - not formally tested d/t severely of hearing loss and inability to hear this Clinical research associate -    READING COMPREHENSION: not tested  EXPRESSION: verbal  VERBAL EXPRESSION: Level of generative/spontaneous verbalization: sentence Automatic speech: name: intact and social response: intact  Will require further testing once properly aided for hearing   WRITTEN EXPRESSION: Dominant hand: right   Written expression: Not tested  MOTOR  SPEECH: Overall motor speech: Appears intact Level of impairment: Sentence Respiration: diaphragmatic/abdominal breathing and speaking on residual capacity Phonation: breathy, hoarse, and low vocal intensity Resonance: WFL Articulation: Appears intact Intelligibility: Intelligibility reduced Motor planning: Appears intact Interfering components: hearing loss Effective technique: increased vocal intensity  ORAL MOTOR EXAMINATION: Facial : WFL Lingual: WFL Velum: WFL Mandible: WFL Cough: WFL Voice: Breathy, Weak   STANDARDIZED ASSESSMENTS: Attempted initial assessment with Add[place]oke's Cognitive Examination - ACE III Discontinued as pt unable to hear this Clinical research associate for accurate assessment of cognition vs hearing impairment.     PATIENT REPORTED OUTCOME MEASURES (PROM): To be completed over next 3 sessions   TODAY'S TREATMENT:  Skilled verbal education provided on need to wear hearing aids all day everyday to increase pt's prognosis for recovery of function as well as for prevention of further cognitive decline.   PATIENT EDUCATION: Education details: need to wear hearing aids, stroke recovery, dementia and cognition, ST POC Person educated: Patient and Spouse Education method: Explanation Education comprehension: needs further education  HOME EXERCISE PROGRAM:        Wear hearing aids    GOALS:  Goals reviewed with patient? Yes  SHORT TERM GOALS: Target date: 10 sessions  Pt will complete formal cognitive communication evaluation.  Baseline: Goal status: INITIAL   2.  Pt will refer to external memory aids for accurate recall of orientation information in 6 out of 7 opportunities.  Baseline: oriented to self and town Goal status: INITIAL  3.  With Min A, pt will complete Expiratory Muscle Training (EMT) across 30 trials i at 50-70% of MEP improve coordination of respiration and phonation and maximize breath support for  speech. Baseline:  Goal status:  INITIAL  4.  With Moderate assistance, pt will utilize speech intelligibility strategies to achieve > 80% speech intelligibility at the sentence level.  Baseline:  Goal status: INITIAL   LONG TERM GOALS: Target date: 04/24/2023  With Min A, patient will use external aids to manage appointments, chores, shopping, and log vital signs.    Baseline:  Goal status: INITIAL  2.  Pt will report improved cognitive communication via PROM by 5 points at last ST session   Baseline:  Goal status: INITIAL  3.  Pt will complete HEP with moderate assistance across 5 out of 7 opportunities.  Baseline:  Goal status: INITIAL   ASSESSMENT:  CLINICAL IMPRESSION:  Patient is a 88 y.o. male who was seen today for a cognitive communication evaluation following a right PCA CVA in the setting of pre-existing cognitive decline and TBI as well as chronically unaided moderate bilateral sensorineural hearing loss.  Formal cognitive evaluation will be completed during next appt as pt has hearing aids but doesn't have them in today. Pt with difficulty hearing this Clinical research associate throughout evaluation. Of note, he has an audiology appt tomorrow for assessment and reprogramming of hearing aids.      Functionally, pt presents with significant memory loss and is oriented to himself, his wife, day of week, current town, county and country.   OBJECTIVE IMPAIRMENTS include attention, memory, awareness, executive functioning, and dysarthria. These impairments are limiting patient from managing medications, managing appointments, managing finances, household responsibilities, ADLs/IADLs, and effectively communicating at home and in community. Factors affecting potential to achieve goals and functional outcome are ability to learn/carryover information, co-morbidities, previous level of function, severity of impairments, and unaided hearing loss .Marland Kitchen Patient will benefit from skilled SLP services to address above impairments and improve  overall function.  REHAB POTENTIAL: Fair pre-existing cognitive decline, chronically unaided hearing loss, severity of memory loss at present  PLAN: SLP FREQUENCY: 1-2x/week  SLP DURATION: 12 weeks  PLANNED INTERVENTIONS: Environmental controls, Cueing hierachy, Cognitive reorganization, Internal/external aids, Functional tasks, SLP instruction and feedback, Compensatory strategies, and Patient/family education    Tarius Stangelo B. Dreama Saa, M.S., CCC-SLP, Tree surgeon Certified Brain Injury Specialist [facility]  [facility] Rehabilitation Services Office [phone] Ascom [phone] Fax [phone]

## 2023-02-07 NOTE — Therapy (Signed)
OUTPATIENT OCCUPATIONAL THERAPY NEURO TREATMENT NOTE  Patient Name: OLUWADAMILOLA DOMBROWSKI MRN: [id number] DOB:1934/09/10, 88 y.o., male Today's Date: 02/07/2023  PCP: Leonor Liv  REFERRING PROVIDER: Clydie Braun   END OF SESSION:  OT End of Session - 02/07/23 1655     Visit Number 3    Number of Visits 24    Date for OT Re-Evaluation 04/24/23    OT Start Time 1020    OT Stop Time 1100    OT Time Calculation (min) 40 min    Activity Tolerance Patient tolerated treatment well    Behavior During Therapy [facility] for tasks assessed/performed              Past Medical History:  Diagnosis Date   Acute gastrointestinal hemorrhage    Acute hypoxemic respiratory failure (HCC) 05/25/2022   Acute thrombotic stroke (HCC) 12/14/2022   Basal cell carcinoma    Blood in stool 08/30/2018   Blood transfusion without reported diagnosis 60 (age 21)   After traumatic brain injury   Broken ribs    2 BROKEN ON RIBS   CKD (chronic kidney disease)    PT DENIES   Color blindness    DVT (deep venous thrombosis) (HCC)    RIGHT LEG   Dyslexia    Fracture dislocation of wrist    RIGHT   GI bleed 09/02/2018   Hiatal hernia with GERD    HTN (hypertension)    Hypokalemia 05/26/2022   Hyponatremia 05/25/2022   Symptomatic anemia 09/03/2018   TBI (traumatic brain injury) (HCC)    w/skull Fx and crani in 1960s   Past Surgical History:  Procedure Laterality Date   COLONOSCOPY  2014   cleared for 5 yrs   COLONOSCOPY N/A 09/01/2018   Procedure: COLONOSCOPY;  Surgeon: Pasty Spillers, MD;  Location: [facility] ENDOSCOPY;  Service: Endoscopy;  Laterality: N/A;   CRANIECTOMY FOR DEPRESSED SKULL FRACTURE  1960'S   ESOPHAGOGASTRODUODENOSCOPY N/A 09/01/2018   Procedure: ESOPHAGOGASTRODUODENOSCOPY (EGD);  Surgeon: Pasty Spillers, MD;  Location: [facility] ENDOSCOPY;  Service: Endoscopy;  Laterality: N/A;   HERNIA REPAIR     X2   IVC FILTER REMOVAL N/A 07/03/2017   Procedure: IVC FILTER REMOVAL;   Surgeon: Annice Needy, MD;  Location: [facility] INVASIVE CV LAB;  Service: Cardiovascular;  Laterality: N/A;   LUMBAR LAMINECTOMY/DECOMPRESSION MICRODISCECTOMY N/A 04/20/2020   Procedure: L2/3, L3/4 LAMINECTOMY;  Surgeon: Lucy Chris, MD;  Location: [facility] ORS;  Service: Neurosurgery;  Laterality: N/A;  2ND CASE   PERIPHERAL VASCULAR THROMBECTOMY Right 05/22/2017   Procedure: PERIPHERAL VASCULAR THROMBECTOMY;  Surgeon: Annice Needy, MD;  Location: [facility] INVASIVE CV LAB;  Service: Cardiovascular;  Laterality: Right;   SPINE SURGERY  December 2021   TONSILLECTOMY     Patient Active Problem List   Diagnosis Date Noted   Insomnia due to medical condition 12/29/2022   Elevated low density lipoprotein (LDL) cholesterol level 12/29/2022   Generalized anxiety disorder 12/29/2022   GERD (gastroesophageal reflux disease) 12/29/2022   ARF (acute renal failure) (HCC) 12/29/2022   Stroke (cerebrum) (HCC) 12/15/2022   Acute thrombotic stroke (HCC) 12/14/2022   Acute delirium 12/14/2022   Overweight (BMI 25.0-29.9) 12/13/2022   Weakness 12/12/2022   Cognitive impairment 12/12/2022   SOB (shortness of breath) 06/02/2022   Acute respiratory failure with hypoxia (HCC) 05/25/2022   Multifocal pneumonia 05/24/2022   Falls 05/24/2022   Acute left-sided low back pain with left-sided sciatica 02/25/2020   Lumbar stenosis with neurogenic claudication 02/25/2020   Diverticulosis of  colon with hemorrhage    Diverticulosis of small intestine without hemorrhage    HTN (hypertension) 08/30/2018   Deep vein thrombosis (DVT) of proximal vein of right lower extremity (HCC) 06/14/2017    ONSET DATE: 12/12/22  REFERRING DIAG: CVA  THERAPY DIAG:  Muscle weakness (generalized)  Rationale for Evaluation and Treatment: Rehabilitation  SUBJECTIVE:   SUBJECTIVE STATEMENT:   Pt./wife report they went to his shop last week, however did not go inside.  Pt accompanied by: significant other  PERTINENT HISTORY: ADARRYL TROTTIER is a 88 y.o. male with medical history significant of cognitive impairment, history of TBI, hypertension presenting with weakness, confusion. Admitted to [facility] 12/12/22 for worsening weakness and cognitive impairment. Per MRI impression: "Scattered small acute to subacute infarcts in the right PCA distribution". Completed inpatient rehab stay from 12/15/22 to 12/28/22.   PRECAUTIONS: Fall  WEIGHT BEARING RESTRICTIONS: No  PAIN:  Are you having pain? No  FALLS: Has patient fallen in last 6 months? Yes. 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  OBJECTIVE:   HAND DOMINANCE: Right  ADLs: Overall ADLs: MIN A + cues for initiation  Transfers/ambulation related to ADLs: SUPERVISION with RW Eating: SETUP Grooming: SETUP + cues for initiation  UB Dressing: SETUP LB Dressing: MIN A Toileting: cueing Bathing: MIN A  Tub Shower transfers: MOD A  Equipment: Shower seat with back  IADLs: Shopping: spouse completes Light housekeeping: spouse completes Meal Prep: spouse completes Community mobility: spouse completes Medication management: spouse completes Landscape architect: spouse completes Handwriting: 100% legible  MOBILITY STATUS: Needs Assist: RW  POSTURE COMMENTS:  No Significant postural limitations  ACTIVITY TOLERANCE: Activity tolerance: 5 min standing tolerance   FUNCTIONAL OUTCOME MEASURES: FOTO: 98  pt demonstrates poor insight into deficits   UPPER EXTREMITY ROM:  WFL   UPPER EXTREMITY MMT:   WFL  HAND FUNCTION: Grip strength: Right: 60 lbs; Left: 42 lbs, Lateral pinch: Right: 24 lbs, Left: 22 lbs, and 3 point pinch: Right: 20 lbs,  Left: 16 lbs  COORDINATION: 9 Hole Peg test: Right: 30 sec; Left: 38 sec  SENSATION: WFL Not tested  EDEMA: none   COGNITION: Overall cognitive status: Impaired Completed clock draw with mildly impaired number spacing, unable to set correct times for minute/hand.  VISION: Subjective report:  Baseline vision: Wears glasses all the time Visual history:   VISION ASSESSMENT: To be further assessed in functional context  Patient has difficulty with following activities due to following visual impairments: L inattention Completed Albert's test with positive indicator for Unilateral Spatial Neglect - 10 lines on L lower/middle quadrant missed   OBSERVATIONS: Pt presents with spouse for OT evaluation.    TODAY'S TREATMENT:  DATE: 02/07/2023  Neuromuscular re-education:  Pt. worked on grasping one inch resistive cubes with the left hand using thumb opposition to the tip of the 2nd digit with the thumb. The board was positioned in multiple vertical angles to the left to increase left sided awareness. Pt. worked on pressing them back into place while isolating the 2nd digit in extension. Pt. worked on [facility] skills using the [employer] Task. Pt. worked on sustaining grasp on the resistive tweezers with the right hand while grasping the small, thin, 1" sticks, and moving them from a horizontal position to a vertical position to prepare for placing them into the pegboard. Cues for visual demonstration of the task were provided for wrist position, and hand pattern when placing them into the pegboard. Pt. worked on this task with the board initially placed flat at the tabletop surface, then transitioned to multiple angles to increase the degree of difficulty Pt. switched to the left hand to use the tweezers to remove the vertical pegs. Pt. then worked on  translatory movements of the hand removing the pegs using the left hand for grasping and storing the pegs in the palm.     PATIENT EDUCATION: Education details:left sided awareness,  lateral, and 3pt. Pinch position Person educated: Spouse Education method: Explanation Education comprehension: verbalized understanding  HOME EXERCISE PROGRAM: [facility] HEP to be initiated next session   GOALS: Goals reviewed with patient? Yes  SHORT TERM GOALS: Target date: 03/13/23  Pt and caregiver will IND complete HEP for improved grip strength and dexterity.  Baseline: no HEP Goal status: INITIAL    LONG TERM GOALS: Target date: 04/24/23  Pt will increase L grip strength by 10# to open jars. Baseline: L grip 42# Goal status: INITIAL  2.  Pt will increase activity tolerance for standing dynamic tasks to 15 min to assist with IADLs Baseline: 5 min standing Goal status: INITIAL  3.  Pt will IND don pants with no cues from caregiver for sequencing.  Baseline: MIN A to don pants Goal status: INITIAL  4.  Pt/caregiver will verbalize plan to implement x3 visual compensation strategies for improved I/ADL performance.  Baseline: none Goal status: INITIAL  5.  Pt will improve non-dominant LUE dexterity by 10 seconds for return to leisure activity of wood carving. Baseline: 9 hole peg test 38 sec Goal status: INITIAL   ASSESSMENT:  CLINICAL IMPRESSION:  Pt. and his wife report enjoying the Jamar tweezer task. Pt. worked on components of tweezer movements used in his Tax adviser turquoise into wood. Pt. was able to attend to the task, and complete it with the precision required for shifting the peg position at the precise angle needed to fit the extra small peg hole. Pt. Was able to accurately place all items accurately to the left when challenged during the pegboard task. Pt. Required cuing for the cube task to the left. Pt continues to benefit from OT to improve engagement of LUE  during ADLs, increased tolerance for IADLs, and apply compensatory strategies to decreased caregiver burden.  PERFORMANCE DEFICITS: in functional skills including ADLs, IADLs, and dexterity, cognitive skills including attention, emotional, memory, orientation, problem solving, and safety awareness, and psychosocial skills including coping strategies and environmental adaptation.   IMPAIRMENTS: are limiting patient from ADLs, IADLs, and leisure.   CO-MORBIDITIES: has co-morbidities such as TBI  that affects occupational performance. Patient will benefit from skilled OT to address above impairments and improve overall function.  MODIFICATION OR ASSISTANCE TO COMPLETE  EVALUATION: Min-Moderate modification of tasks or assist with assess necessary to complete an evaluation.  OT OCCUPATIONAL PROFILE AND HISTORY: Detailed assessment: Review of records and additional review of physical, cognitive, psychosocial history related to current functional performance.  CLINICAL DECISION MAKING: Moderate - several treatment options, min-mod task modification necessary  REHAB POTENTIAL: Fair limited by cognition/STM  EVALUATION COMPLEXITY: Moderate    PLAN:  OT FREQUENCY: 1-2x/week  OT DURATION: 12 weeks  PLANNED INTERVENTIONS: self care/ADL training, therapeutic exercise, therapeutic activity, patient/family education, cognitive remediation/compensation, visual/perceptual remediation/compensation, energy conservation, and DME and/or AE instructions  RECOMMENDED OTHER SERVICES: SLP, PT  CONSULTED AND AGREED WITH PLAN OF CARE: Patient and family member/caregiver  PLAN FOR NEXT SESSION: initiate HEP   Olegario Messier, MS, OTR/L 02/07/2023, 5:00 PM

## 2023-02-09 ENCOUNTER — Encounter: Payer: Medicare PPO | Attending: Physical Medicine & Rehabilitation | Admitting: Physical Medicine & Rehabilitation

## 2023-02-09 ENCOUNTER — Ambulatory Visit: Payer: Medicare PPO | Admitting: Speech Pathology

## 2023-02-09 ENCOUNTER — Ambulatory Visit: Payer: Medicare PPO | Admitting: Occupational Therapy

## 2023-02-09 ENCOUNTER — Ambulatory Visit: Payer: Medicare PPO

## 2023-02-09 ENCOUNTER — Encounter: Payer: Self-pay | Admitting: Physical Medicine & Rehabilitation

## 2023-02-09 VITALS — BP 129/82 | HR 80 | Ht 67.0 in | Wt 157.0 lb

## 2023-02-09 DIAGNOSIS — R41841 Cognitive communication deficit: Secondary | ICD-10-CM | POA: Diagnosis not present

## 2023-02-09 DIAGNOSIS — I69398 Other sequelae of cerebral infarction: Secondary | ICD-10-CM | POA: Diagnosis not present

## 2023-02-09 DIAGNOSIS — H538 Other visual disturbances: Secondary | ICD-10-CM | POA: Diagnosis not present

## 2023-02-09 DIAGNOSIS — F02B11 Dementia in other diseases classified elsewhere, moderate, with agitation: Secondary | ICD-10-CM | POA: Diagnosis not present

## 2023-02-09 DIAGNOSIS — G301 Alzheimer's disease with late onset: Secondary | ICD-10-CM | POA: Diagnosis not present

## 2023-02-09 DIAGNOSIS — R296 Repeated falls: Secondary | ICD-10-CM

## 2023-02-09 DIAGNOSIS — M6281 Muscle weakness (generalized): Secondary | ICD-10-CM

## 2023-02-09 DIAGNOSIS — R278 Other lack of coordination: Secondary | ICD-10-CM

## 2023-02-09 DIAGNOSIS — Z8673 Personal history of transient ischemic attack (TIA), and cerebral infarction without residual deficits: Secondary | ICD-10-CM | POA: Insufficient documentation

## 2023-02-09 DIAGNOSIS — R2681 Unsteadiness on feet: Secondary | ICD-10-CM

## 2023-02-09 DIAGNOSIS — R2689 Other abnormalities of gait and mobility: Secondary | ICD-10-CM

## 2023-02-09 DIAGNOSIS — R262 Difficulty in walking, not elsewhere classified: Secondary | ICD-10-CM

## 2023-02-09 DIAGNOSIS — I6389 Other cerebral infarction: Secondary | ICD-10-CM | POA: Diagnosis not present

## 2023-02-09 NOTE — Progress Notes (Signed)
Subjective:    Patient ID: Clayton [name], male    DOB: [birth year], 88 y.o.   MRN: [id number]  88 y.o. male with history of GI bleed, DVT, HTN in the past, TBI, cognitive impairment who was admitted to [facility] on 12/12/2022 with reports of weakness, tendency to bump into things as well as confusion x 1 day.  Wife also reported multiple falls for a week.  He was found to have subacute infarct right PCA and CTA showed abrupt occlusion right PCA.  And P1 segment with atherosclerotic disease bilateral carotid bifurcations.  Neurology felt the stroke was thrombotic and recommended Plavix for secondary stroke prevention due to history of aspirin allergy.  A Zio patch was also placed to rule out arrhythmias as cause of stroke.  He has had issues with agitation and Seroquel was added to help improve sleep hygiene.  Nadine Counts did report that patient had pulled off Zio patch multiple times due to confusion and agitation.  Therapy was working with patient was limited by left HH with left inattention, decreased coordination left upper extremity, cognitive deficit with difficulty following commands as well as poor safety awareness  Admit date: 12/15/2022 Discharge date: 12/29/2022 HPI 88 year old male with history of dementia diagnosed by neurology with symptoms ongoing for approximately 2 years prior to his stroke which affected the right PCA distribution causing additional worsening of his cognitive status as well as left neglect and balance disorder.  He completed his inpatient stroke program and was discharged to his home where his wife has been assisting with his care.  She has noticed fluctuations in his mental status throughout the day.  She she also has noticed appeared of increased agitation between 4 PM and 4 AM.  She has tried taking Seroquel as advised by neurology up to 75 mg at night.  She was advised to take up to 100 but she did not want to try that much. The patient is no longer taking trazodone.  She is  asking if there is any other medications that may be helpful.  Patient also has a couple episodes a day of crying which last about 45 minutes.  During the day he does not have excessive lability.  Needs assist with dressing  Wife helps with pants and buttoning shirt  Does not use walker in ams but needs to use later in afternoon. Fell on bathroom floor in the night a couple nights ago.  No injuries noted.  Currently receiving outpatient rehabilitation services PT OT and speech at [facility].  Pain Inventory Average Pain 0 Pain Right Now 0 My pain is  no pain  In the last 24 hours, has pain interfered with the following? General activity 0 Relation with others 0 Enjoyment of life 0 What TIME of day is your pain at its worst? na Sleep (in general) Poor  Pain is worse with:  no pain Pain improves with:  no pain Relief from Meds:  na  Family History  Problem Relation Age of Onset   Heart disease Father    Social History   Socioeconomic History   Marital status: Married    Spouse name: Not on file   Number of children: 3   Years of education: Not on file   Highest education level: Associate degree: occupational, Scientist, product/process development, or vocational program  Occupational History   Not on file  Tobacco Use   Smoking status: Former    Current packs/day: 0.00    Average packs/day: 0.5 packs/day  for 3.5 years (1.7 ttl pk-yrs)    Types: Cigarettes    Start date: 07/03/1955    Quit date: 01/01/1959    Years since quitting: 64.1   Smokeless tobacco: Never   Tobacco comments:    I smoked from about age 28 to 27.  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   Overall Financial Resource Strain (CARDIA)    Difficulty of Paying Living Expenses: Not very hard  Food Insecurity: No Food Insecurity (01/03/2023)   Received from [facility]   Hunger Vital Sign    Worried About Running Out of Food in the Last Year: Never true    Ran Out of Food in the Last Year: Never true  Transportation Needs: No Transportation Needs (01/03/2023)   Received from [facility] - Transportation    In the past 12 months, has lack of transportation kept you from medical appointments or from getting medications?: No    Lack of Transportation (Non-Medical): No  Physical Activity: Inactive (11/10/2022)   Exercise Vital Sign    Days of Exercise per Week: 0 days    Minutes of Exercise per Session: 0 min  Stress: No Stress Concern Present (11/10/2022)   [employer] of Occupational Health - Occupational Stress Questionnaire    Feeling of Stress : Not at all  Social Connections: Moderately Isolated (11/10/2022)   Social Connection and Isolation Panel [NHANES]    Frequency of Communication with Friends and Family: More than three times a week    Frequency of Social Gatherings with Friends and Family: Three times a week    Attends Religious Services: Never    Active Member of Clubs or Organizations: No    Attends Banker Meetings: Never    Marital Status: Married   Past Surgical History:  Procedure Laterality Date   COLONOSCOPY  2014   cleared for 5 yrs   COLONOSCOPY N/A 09/01/2018   Procedure: COLONOSCOPY;  Surgeon: Pasty Spillers, MD;  Location: [facility] ENDOSCOPY;  Service: Endoscopy;  Laterality: N/A;   CRANIECTOMY FOR DEPRESSED SKULL FRACTURE  1960'S   ESOPHAGOGASTRODUODENOSCOPY N/A 09/01/2018   Procedure: ESOPHAGOGASTRODUODENOSCOPY (EGD);   Surgeon: Pasty Spillers, MD;  Location: [facility] ENDOSCOPY;  Service: Endoscopy;  Laterality: N/A;   HERNIA REPAIR     X2   IVC FILTER REMOVAL N/A 07/03/2017   Procedure: IVC FILTER REMOVAL;  Surgeon: Annice Needy, MD;  Location: [facility] INVASIVE CV LAB;  Service: Cardiovascular;  Laterality: N/A;   LUMBAR LAMINECTOMY/DECOMPRESSION MICRODISCECTOMY N/A 04/20/2020   Procedure: L2/3, L3/4 LAMINECTOMY;  Surgeon: Lucy Chris, MD;  Location: [facility] ORS;  Service: Neurosurgery;  Laterality: N/A;  2ND CASE   PERIPHERAL VASCULAR THROMBECTOMY Right 05/22/2017   Procedure: PERIPHERAL VASCULAR THROMBECTOMY;  Surgeon: Annice Needy, MD;  Location: [facility] INVASIVE CV LAB;  Service: Cardiovascular;  Laterality: Right;   SPINE SURGERY  December 2021   TONSILLECTOMY     Past Surgical History:  Procedure Laterality Date   COLONOSCOPY  2014   cleared for 5 yrs   COLONOSCOPY N/A 09/01/2018   Procedure: COLONOSCOPY;  Surgeon: Pasty Spillers, MD;  Location: [facility] ENDOSCOPY;  Service: Endoscopy;  Laterality: N/A;   CRANIECTOMY FOR DEPRESSED SKULL FRACTURE  1960'S   ESOPHAGOGASTRODUODENOSCOPY N/A 09/01/2018   Procedure: ESOPHAGOGASTRODUODENOSCOPY (EGD);  Surgeon: Pasty Spillers, MD;  Location: [facility] ENDOSCOPY;  Service: Endoscopy;  Laterality: N/A;   HERNIA REPAIR     X2   IVC FILTER REMOVAL N/A 07/03/2017   Procedure: IVC FILTER REMOVAL;  Surgeon: Annice Needy, MD;  Location: [facility] INVASIVE CV LAB;  Service: Cardiovascular;  Laterality: N/A;   LUMBAR LAMINECTOMY/DECOMPRESSION MICRODISCECTOMY N/A 04/20/2020   Procedure: L2/3, L3/4 LAMINECTOMY;  Surgeon: Lucy Chris, MD;  Location: [facility] ORS;  Service: Neurosurgery;  Laterality: N/A;  2ND CASE   PERIPHERAL VASCULAR THROMBECTOMY Right 05/22/2017   Procedure: PERIPHERAL VASCULAR THROMBECTOMY;  Surgeon: Annice Needy, MD;  Location: [facility] INVASIVE CV LAB;  Service: Cardiovascular;  Laterality: Right;   SPINE SURGERY  December 2021   TONSILLECTOMY     Past  Medical History:  Diagnosis Date   Acute gastrointestinal hemorrhage    Acute hypoxemic respiratory failure (HCC) 05/25/2022   Acute thrombotic stroke (HCC) 12/14/2022   Basal cell carcinoma    Blood in stool 08/30/2018   Blood transfusion without reported diagnosis 1961 (age 37)   After traumatic brain injury   Broken ribs    2 BROKEN ON RIBS   CKD (chronic kidney disease)    PT DENIES   Color blindness    DVT (deep venous thrombosis) (HCC)    RIGHT LEG   Dyslexia    Fracture dislocation of wrist    RIGHT   GI bleed 09/02/2018   Hiatal hernia with GERD    HTN (hypertension)    Hypokalemia 05/26/2022   Hyponatremia 05/25/2022   Symptomatic anemia 09/03/2018   TBI (traumatic brain injury) (HCC)    w/skull Fx and crani in 1960s   BP 129/82   Pulse 80   Ht 5\' 7"  (1.702 m)   Wt 157 lb (71.2 kg)   SpO2 95%   BMI 24.59 kg/m   Opioid Risk Score:   Fall Risk Score:  `1  Depression screen PHQ 2/9     01/10/2023    1:05 PM 11/10/2022    8:48 AM 08/04/2022    8:37 AM 07/01/2022   10:34 AM 06/10/2022    1:35 PM 06/02/2022    3:10 PM 11/30/2021    8:36 AM  Depression screen PHQ 2/9  Decreased Interest 0 0 0 0 0 0 0  Down, Depressed, Hopeless 0 0 0 0 0 0 0  PHQ - 2 Score 0 0 0 0 0 0 0  Altered sleeping 0 0 0 0 0 0 1  Tired, decreased energy 0 0 1 1 0 0 1  Change in appetite 0 0 0 0 0 0 0  Feeling bad or failure about yourself  0 0 0 0 0 0 0  Trouble concentrating 0 0 0 0 0 0 0  Moving slowly or fidgety/restless 0 0 0 0 0 0 0  Suicidal thoughts 0 0 0 0 0 0 0  PHQ-9 Score 0 0 1 1 0 0 2  Difficult doing work/chores  Not difficult at all Not difficult at all Not difficult at all Not difficult at all Not  difficult at all Not difficult at all     Review of Systems  All other systems reviewed and are negative.     Objective:   Physical Exam Vitals and nursing note reviewed.  Constitutional:      Appearance: Normal appearance.  HENT:     Head: Normocephalic and atraumatic.   Eyes:     Extraocular Movements: Extraocular movements intact.     Pupils: Pupils are equal, round, and reactive to light.  Musculoskeletal:        General: No swelling or tenderness.  Skin:    General: Skin is warm and dry.  Neurological:     General: No focal deficit present.     Mental Status: He is alert and oriented to person, place, and time.  Psychiatric:        Mood and Affect: Mood normal.        Behavior: Behavior normal.   Motor strength is 4+ in the left deltoid bicep tricep grip 5/5 in the right deltoid bicep tricep grip 5/5 bilateral hip flexor knee extensor and right ankle dorsiflexor 4/5 left ankle dorsiflexor Visual fields are intact confrontation testing Positive Romberg tends to fall towards the left side  Ambulates with a walker forward flexed posture fast gait.  Oriented to person season year day of week but not month Not oriented to city       Assessment & Plan:   1.  Right PCA distribution infarct as discussed with wife the main impact is additional worsening of his cognitive functioning as well as for visual perceptual issues on the left side and mild balance disorder.  We discussed that deficits related to the stroke may improve over the next 6 or more months however he has a diagnosis of dementia which is worsening over time. We discussed that the sundowning as well as the crying episodes are mostly dementia related.  There is a possibility of some poststroke lability or depression and we discussed the possibility of SSRI but at this point will not start. We discussed that Seroquel can be helpful but also at higher doses may cause drowsiness or even increased fall risk.  We discussed that there is no specific medication for sundowning.  Will continue outpatient PT OT speech prognosis for cognitive improvement is rather guarded given his underlying issues. I will see the patient back in 3 months Discussion with wife over 30 minutes visit duration

## 2023-02-09 NOTE — Therapy (Signed)
OUTPATIENT SPEECH LANGUAGE PATHOLOGY  TREATMENT NOTE   Patient Name: Clayton [name] MRN: [id number] DOB:25-Mar-1935, 88 y.o., male Today's Date: 02/09/2023  PCP: Leonor Liv, MD REFERRING PROVIDER: Claudette Laws, MD   End of Session - 02/09/23 1037     Visit Number 3    Number of Visits 25    Date for SLP Re-Evaluation 04/24/23    Authorization Type [employer] Medicare Choice PPO    Authorization Time Period 01/30/2023 thru 03/31/2023    Authorization - Visit Number 3    Authorization - Number of Visits 18    Progress Note Due on Visit 10    SLP Start Time 0930    SLP Stop Time  1015    SLP Time Calculation (min) 45 min    Activity Tolerance Patient tolerated treatment well             No past medical history on file.  The histories are not reviewed yet. Please review them in the "History" navigator section and refresh this SmartLink. Patient Active Problem List   Diagnosis Date Noted   Insomnia due to medical condition 12/29/2022   Elevated low density lipoprotein (LDL) cholesterol level 12/29/2022   Generalized anxiety disorder 12/29/2022   GERD (gastroesophageal reflux disease) 12/29/2022   ARF (acute renal failure) (HCC) 12/29/2022   Stroke (cerebrum) (HCC) 12/15/2022   Acute thrombotic stroke (HCC) 12/14/2022   Acute delirium 12/14/2022   Overweight (BMI 25.0-29.9) 12/13/2022   Weakness 12/12/2022   Cognitive impairment 12/12/2022   SOB (shortness of breath) 06/02/2022   Acute respiratory failure with hypoxia (HCC) 05/25/2022   Multifocal pneumonia 05/24/2022   Falls 05/24/2022   Acute left-sided low back pain with left-sided sciatica 02/25/2020   Lumbar stenosis with neurogenic claudication 02/25/2020   Diverticulosis of colon with hemorrhage    Diverticulosis of small intestine without hemorrhage    HTN (hypertension) 08/30/2018   Deep vein thrombosis (DVT) of proximal vein of right lower extremity (HCC) 06/14/2017    ONSET DATE: pt's wife  reports gradual decline ~ 2 years ago, Cognitive Impairment initially dx on 12/12/2022; Right PCA CVA 12/12/2022; date of referral 01/16/2023   REFERRING DIAG: I63.531 (ICD-10-CM) - Acute right PCA stroke (HCC)  THERAPY DIAG:  Cognitive communication deficit  Rationale for Evaluation and Treatment Rehabilitation  SUBJECTIVE:   PERTINENT HISTORY and DIAGNOSTIC FINDINGS: Pt is a 88 year old male with past medical history significant for TBI, hypertension, hyperlipidemia with multiple hospitalizations in January-February 2024 for pneumonia. While pt's wife reports gradual decrease in pt's vocal intensity and cognition, she reports singificant decline in memory and voice following this hospitalization (chart review reveals ambulatory referral made to neurology for concern regarding "mild cognitive impairment and dementia"). Pt seen by outpatient neurology on 06/28/2022 with MRI scheduled d/t concern with worsening memory loss. MRI 07/13/2022 - IMPRESSION: 1. No acute abnormality or reversible cause of memory loss. 2.  Cerebral atrophy. Pt with follow up appointment with Outpatient Neurology on 08/15/2022 with improved restlessness at night and mildly improved mood, ongiong unsteadiness while walking and unchanged memory deficits. Per chart, "discussed at length prognosis and safety concerns in setting of mild to moderate dementia." On 12/12/2022 pt [facility] to [facility] ED on 12/12/2022 with confusion. MRI confirmed (12/12/2022) FINDINGS: Small areas of restricted diffusion in the right occipital cortex, right hippocampus, and right thalamus. Other than the occipital cortex, diffusion restriction is fairly weak. Symmetric diffusion hyperintensity along the periphery of the bilateral cerebellum is likely artifactual. Generalized cerebral volume loss.  No acute hemorrhage, hydrocephalus, mass, or collection. Mild chronic small vessel ischemia in the cerebral white matter. IMPRESSION: 1. Scattered small acute to  subacute infarcts in the right PCA distribution 2. Generalized brain atrophy. [facility] until 12/28/2022. Pt with homehealth therapies in interm of Outpatient services. Follow up outpatient neurology on 01/17/2023 reveales worsening short and long term meory loss "memory evaluation today is 11/30."   This is in a largely unaided person with documented moderate bilateral sensory neural hearing loss (2015).   PAIN:  Are you having pain? No   FALLS: Has patient fallen in last 6 months?  See PT evaluation for details  LIVING ENVIRONMENT: Lives with: lives with their spouse Lives in: House/apartment  PLOF:  Level of assistance: Needed assistance with ADLs, Needed assistance with IADLS Employment: Retired   PATIENT GOALS   caregiver expresses desire to be able to facilitate recovery better  SUBJECTIVE STATEMENT: Pt's wife reports improve compliance with wearing hearing aids Pt accompanied by: significant other  OBJECTIVE:     TODAY'S TREATMENT:  Skilled treatment session focused on pt's cognitive communication goals. SLP facilitated session by providing the following interventions:  SLP provided skilled verbal and written information on severity of current cognitive disorder, suspected multifactorial causes, results of pt's hearing assessment (specifically declined word recognition ability), memory, awareness and confabulation. During moments of inaccurate recall by pt, he was not aware of inaccuracy and level of confabulation. Attempts at providing accurate information were not successful in building memory.   PATIENT EDUCATION: Education details: need to wear hearing aids, stroke recovery, dementia and cognition, ST POC Person educated: Patient and Spouse Education method: Explanation Education comprehension: needs further education  HOME EXERCISE PROGRAM:        Wear hearing aids    GOALS:  Goals reviewed with patient? Yes  SHORT TERM GOALS: Target  date: 10 sessions  Pt will complete formal cognitive communication evaluation.  Baseline: Goal status: INITIAL   2.  Pt will refer to external memory aids for accurate recall of orientation information in 6 out of 7 opportunities.  Baseline: oriented to self and town Goal status: INITIAL  3.  With Min A, pt will complete Expiratory Muscle Training (EMT) across 30 trials i at 50-70% of MEP improve coordination of respiration and phonation and maximize breath support for speech. Baseline:  Goal status: INITIAL  4.  With Moderate assistance, pt will utilize speech intelligibility strategies to achieve > 80% speech intelligibility at the sentence level.  Baseline:  Goal status: INITIAL   LONG TERM GOALS: Target date: 04/24/2023  With Min A, patient will use external aids to manage appointments, chores, shopping, and log vital signs.    Baseline:  Goal status: INITIAL  2.  Pt will report improved cognitive communication via PROM by 5 points at last ST session   Baseline:  Goal status: INITIAL  3.  Pt will complete HEP with moderate assistance across 5 out of 7 opportunities.  Baseline:  Goal status: INITIAL   ASSESSMENT:  CLINICAL IMPRESSION:  Patient is a 88 y.o. male who was seen today for a cognitive communication evaluation following a right PCA CVA in the setting of pre-existing cognitive decline and TBI as well as chronically unaided moderate bilateral sensorineural hearing loss.    Pt currently wearing bilateral hearing aids, therefore formal cognitive assessment was completed. Pt presents with amnestic major neurocognitive disorders that is multifactorial related to baseline dementia (deficits in long term memory and short term  memory for > 2 year prior), noncompliance with hearing loss (his word recognition is 56% -right- and 76% - left), stroke, poor sleep hygiene and possible depression. His memory deficits are profound resulting in confabulation with no awareness on pt's  part.     OBJECTIVE IMPAIRMENTS include attention, memory, awareness, executive functioning, and dysarthria. These impairments are limiting patient from managing medications, managing appointments, managing finances, household responsibilities, ADLs/IADLs, and effectively communicating at home and in community. Factors affecting potential to achieve goals and functional outcome are ability to learn/carryover information, co-morbidities, previous level of function, severity of impairments, and unaided hearing loss .Marland Kitchen Patient will benefit from skilled SLP services to address above impairments and improve overall function.  REHAB POTENTIAL: Fair pre-existing cognitive decline, chronically unaided hearing loss, severity of memory loss at present  PLAN: SLP FREQUENCY: 1-2x/week  SLP DURATION: 12 weeks  PLANNED INTERVENTIONS: Environmental controls, Cueing hierachy, Cognitive reorganization, Internal/external aids, Functional tasks, SLP instruction and feedback, Compensatory strategies, and Patient/family education    Zarie Kosiba B. Dreama Saa, M.S., CCC-SLP, Tree surgeon Certified Brain Injury Specialist [facility]  [facility] Rehabilitation Services Office [phone] Ascom [phone] Fax [phone]

## 2023-02-09 NOTE — Therapy (Addendum)
OUTPATIENT PHYSICAL THERAPY TREATMENT  Patient Name: Clayton [name] MRN: [id number] DOB:Sep 10, 1934, 88 y.o., male Today's Date: 02/09/2023  END OF SESSION:.  PT End of Session - 02/09/23 0858     Visit Number 3    Number of Visits 16    Date for PT Re-Evaluation 04/01/23    Authorization Type [employer] PPO    Authorization Time Period 01/30/23-04/01/23    Progress Note Due on Visit 10    PT Start Time 0845    PT Stop Time 0926    PT Time Calculation (min) 41 min    Equipment Utilized During Treatment Gait belt    Activity Tolerance Patient tolerated treatment well;No increased pain    Behavior During Therapy [facility] for tasks assessed/performed              Past Medical History:  Diagnosis Date   Acute gastrointestinal hemorrhage     Acute hypoxemic respiratory failure (HCC) 05/25/2022   Acute thrombotic stroke (HCC) 12/14/2022   Basal cell carcinoma     Blood in stool 08/30/2018   Blood transfusion without reported diagnosis 56 (age 76)    After traumatic brain injury   Broken ribs      2 BROKEN ON RIBS   CKD (chronic kidney disease)      PT DENIES   Color blindness     DVT (deep venous thrombosis) (HCC)      RIGHT LEG   Dyslexia     Fracture dislocation of wrist      RIGHT   GI bleed 09/02/2018   Hiatal hernia with GERD     HTN (hypertension)     Hypokalemia 05/26/2022   Hyponatremia 05/25/2022   Symptomatic anemia 09/03/2018   TBI (traumatic brain injury) (HCC)      w/skull Fx and crani in 1960s                  Past Surgical History:  Procedure Laterality Date   COLONOSCOPY   2014    cleared for 5 yrs   COLONOSCOPY N/A 09/01/2018    Procedure: COLONOSCOPY;  Surgeon: Pasty Spillers, MD;  Location: [facility] ENDOSCOPY;  Service: Endoscopy;  Laterality: N/A;   CRANIECTOMY FOR DEPRESSED SKULL FRACTURE   1960'S   ESOPHAGOGASTRODUODENOSCOPY N/A 09/01/2018    Procedure: ESOPHAGOGASTRODUODENOSCOPY (EGD);  Surgeon: Pasty Spillers, MD;  Location: [facility] ENDOSCOPY;  Service: Endoscopy;  Laterality: N/A;   HERNIA REPAIR        X2   IVC FILTER REMOVAL N/A 07/03/2017    Procedure: IVC FILTER REMOVAL;  Surgeon: Annice Needy, MD;  Location: [facility] INVASIVE CV LAB;  Service: Cardiovascular;  Laterality: N/A;   LUMBAR LAMINECTOMY/DECOMPRESSION MICRODISCECTOMY N/A 04/20/2020    Procedure: L2/3, L3/4 LAMINECTOMY;  Surgeon: Lucy Chris, MD;  Location: [facility] ORS;  Service: Neurosurgery;  Laterality: N/A;  2ND CASE   PERIPHERAL VASCULAR THROMBECTOMY Right 05/22/2017    Procedure: PERIPHERAL VASCULAR THROMBECTOMY;  Surgeon: Annice Needy, MD;  Location: [facility] INVASIVE CV LAB;  Service: Cardiovascular;  Laterality: Right;   SPINE SURGERY   December 2021   TONSILLECTOMY                    Patient Active Problem List    Diagnosis Date Noted   Insomnia due to medical condition 12/29/2022   Elevated low density lipoprotein (LDL) cholesterol level 12/29/2022   Generalized anxiety disorder 12/29/2022   GERD (gastroesophageal reflux disease) 12/29/2022   ARF (acute renal  failure) (HCC) 12/29/2022   Stroke (cerebrum) (HCC) 12/15/2022   Acute thrombotic stroke (HCC) 12/14/2022   Acute delirium 12/14/2022   Overweight (BMI 25.0-29.9) 12/13/2022   Weakness 12/12/2022   Cognitive impairment 12/12/2022   SOB (shortness of breath) 06/02/2022   Acute respiratory failure with hypoxia (HCC) 05/25/2022   Multifocal pneumonia 05/24/2022   Falls 05/24/2022   Acute left-sided low back pain with left-sided sciatica 02/25/2020   Lumbar stenosis with neurogenic claudication 02/25/2020   Diverticulosis of colon with hemorrhage     Diverticulosis of small intestine without hemorrhage     HTN (hypertension) 08/30/2018   Deep vein thrombosis (DVT) of proximal vein of right lower extremity (HCC) 06/14/2017      PCP: Tresa Garter MD  REFERRING PROVIDER: Wynn Banker MD  REFERRING DIAG: CVA  Rationale for Evaluation and Treatment:  Rehabilitation  THERAPY DIAG:  No diagnosis found.  ONSET DATE: 12/12/22  SUBJECTIVE:                                                                                                                                                                                           SUBJECTIVE STATEMENT: Patients wife reports patient has bee off with some slurring and reported dizziness once stepped off elevator  to come to Therpy    PERTINENT HISTORY:  Clayton [name] is a 88 y.o. male with medical history significant of cognitive impairment, history of TBI, hypertension presenting with weakness, confusion. Admitted to [facility] 12/12/22 for worsening weakness and cognitive impairment. Per MRI impression: "Scattered small acute to subacute infarcts in the right PCA distribution". Prior Level of Function : Independent/Modified Independent;Working/employed. Per wife: pt able to walk around without issue, Indepentdent without use of AD, however has a history of severla falls in past 2 years which she attributes to his bilat sensory neuropathy. He has been largely noncompliant with RW recommendations since Feb 2024. Pt DC to inpatient rehab in [place], then home with HHPT.   PAIN:  Are you having pain? No  PRECAUTIONS: Fall   WEIGHT BEARING RESTRICTIONS: No  FALLS:  Has patient fallen in last 6 months? Yes. Number of falls: unsure  LIVING ENVIRONMENT: Lives with: Wife Stairs: Yes: 8 entry stairs with 1 railing  Has following equipment at home: Dan Humphreys - 2 wheeled  OCCUPATION: Radiographer, therapeutic" makes crafts and art   PLOF: Independent with basic ADLs  PATIENT GOALS: Wife desires improved safety and stamina in the home, falls avoidance   NEXT MD VISIT: unknown  OBJECTIVE:   PATIENT SURVEYS:  FOTO: not appropriate due to memory/awareness deficits   COGNITION: Overall cognitive status: memory/awareness deficits  SENSATION: Per wife: sensory neuropathy BLE   FUNCTIONAL TESTS:  -5xSTS:  14.76sec hands free, no LOB  - : 949ft (several LOB when turning), no device, needs cues for navigation due to memory deficits  -TUG: 9.88sec, no device, no LOB   Balance Assessment: -SLS: ~3 sec bilat (large amplitude righting, full body)  -eyes closed firm: 30sec, mild to no sway -eyes closed foam: ~20 sec (sway uncontrolled), heavy sway increase -Berg Balance Test: pending     TODAY'S TREATMENT:                                                                                                                              DATE: 02/09/23   BP= 99/64 mmHg (sitting)  BP=114/68 mmHg (standing) asymptomatic  THEREX: 2 sets of the following  Seated Hip march 3# AW- 10 reps Seated Knee ext 3# AW with 3 sec hold x 10 reps Seated hip abd (GTB) x 10 reps  Seated Ham curl (GTB) x 10 reps Seated calf raises 3# x 10 reps- Left calf tight/pain       PATIENT EDUCATION:  Education details: Discussion on blood pressure and how it can relate to dizziness; Exercise techniqueintroduction to HEP for general LE strengthening Person educated: Patient, wife  Education method: We spoke about it Education comprehension: Pt verbalized understanding of message, however anticipate need to repeat and emphasize message due to empirical evidence that pt's only recall ~70% of education from their provider.   HOME EXERCISE PROGRAM: Access Code: [email or web address] URL: [email or web address] Date: 02/02/2023 Prepared by: Maureen Ralphs  Exercises - Sit to Stand with Arms Crossed  - 3 x weekly - 3 sets - 10 reps - Standing Hip Abduction with Counter Support  - 3 x weekly - 3 sets - 10 reps - Standing Heel Raises  - 3 x weekly - 3 sets - 10 reps - 3 hold  ASSESSMENT:  CLINICAL IMPRESSION: Patient presents with more fatigue and complaint of some dizziness (just prior to visit but none during). Treatment was limited to just sitting therex and patient responded well- some complaint of left calf  pain with calf raises. He has appt with Rehab MD later today and instructed wife to discuss his symptoms today with MD.  Patient will benefit from continued skilled PT services to improve his overall functional LE strength and mobility to decrease fall risk and improve overall quality of life.   OBJECTIVE IMPAIRMENTS: Abnormal gait, cardiopulmonary status limiting activity, decreased activity tolerance, decreased balance, decreased coordination, decreased mobility, difficulty walking, decreased strength, impaired flexibility, impaired sensation, impaired vision/perception, improper body mechanics, and postural dysfunction.  ACTIVITY LIMITATIONS: carrying, lifting, bending, sitting, standing, squatting, stairs, transfers, bed mobility, dressing, reach overhead, hygiene/grooming, locomotion level, and caring for others  PARTICIPATION LIMITATIONS: meal prep, cleaning, laundry, personal finances, driving, shopping, community activity, occupation, and yard work  PERSONAL FACTORS: Age, Fitness, Past/current experiences, Time since onset of injury/illness/exacerbation, and 3+ comorbidities REHAB POTENTIAL: Good  CLINICAL  DECISION MAKING: Evolving/moderate complexity  EVALUATION COMPLEXITY: moderate  SHORT TERM GOALS: Target date: 03/01/23  Patient to report compliance and success with home based program as prescribed by this site in order to help with pt self-efficacy in outcomes and symptoms management.  Baseline: HEP to be issued at visit 2 Goal status: INITIAL    LONG TERM GOALS: Target date: 11/30  Pt to demonstrate improved power in legs, activity tolerance, and dynamic balance AEB improved 5xSTS and TUG.  Baseline: eval: 5xSTS hands free: 15+seconds; TUG c device in 9.86 sec Goal status: INITIAL  2.  Pt to demonstrate improved tolerance and distance in by >12% to indicate improved ability to perform tasks in community for IADL.   Baseline: eval: : 940ft, no device, several LOB with  turning Goal status: INITIAL  3.  Pt to report more consistent use of 2-hand AD wen performing AMB outside of the house.  Baseline: intemrittent, inconsistent use of DME when AMB out of house.  Goal status: INITIAL PLAN:  PT FREQUENCY: 2x/week  PT DURATION: 2 months   PLANNED INTERVENTIONS: Therapeutic exercises, Therapeutic activity, Neuromuscular re-education, Balance training, Gait training, Patient/Family education, Self Care, and Joint mobilization.  PLAN FOR NEXT SESSION: dual tasking, AMB with RW , progress therex and balance training as appropriate.    9:52 AM, 02/09/23 Louis Meckel, PT Physical Therapist - [facility]  Outpatient Physical Therapy- Main Campus [phone]   '

## 2023-02-09 NOTE — Therapy (Signed)
OUTPATIENT OCCUPATIONAL THERAPY NEURO TREATMENT NOTE  Patient Name: FITZWILLIAM TEEPLES MRN: [id number] DOB:1934-05-18, 88 y.o., male Today's Date: 02/09/2023  PCP: Leonor Liv  REFERRING PROVIDER: Clydie Braun   END OF SESSION:  OT End of Session - 02/09/23 1516     Visit Number 4    Number of Visits 24    Date for OT Re-Evaluation 04/24/23    OT Start Time 1015    OT Stop Time 1100    OT Time Calculation (min) 45 min    Equipment Utilized During Treatment w/c    Activity Tolerance Patient tolerated treatment well    Behavior During Therapy [facility] for tasks assessed/performed              Past Medical History:  Diagnosis Date   Acute gastrointestinal hemorrhage    Acute hypoxemic respiratory failure (HCC) 05/25/2022   Acute thrombotic stroke (HCC) 12/14/2022   Basal cell carcinoma    Blood in stool 08/30/2018   Blood transfusion without reported diagnosis 5 (age 43)   After traumatic brain injury   Broken ribs    2 BROKEN ON RIBS   CKD (chronic kidney disease)    PT DENIES   Color blindness    DVT (deep venous thrombosis) (HCC)    RIGHT LEG   Dyslexia    Fracture dislocation of wrist    RIGHT   GI bleed 09/02/2018   Hiatal hernia with GERD    HTN (hypertension)    Hypokalemia 05/26/2022   Hyponatremia 05/25/2022   Symptomatic anemia 09/03/2018   TBI (traumatic brain injury) (HCC)    w/skull Fx and crani in 1960s   Past Surgical History:  Procedure Laterality Date   COLONOSCOPY  2014   cleared for 5 yrs   COLONOSCOPY N/A 09/01/2018   Procedure: COLONOSCOPY;  Surgeon: Pasty Spillers, MD;  Location: [facility] ENDOSCOPY;  Service: Endoscopy;  Laterality: N/A;   CRANIECTOMY FOR DEPRESSED SKULL FRACTURE  1960'S   ESOPHAGOGASTRODUODENOSCOPY N/A 09/01/2018   Procedure: ESOPHAGOGASTRODUODENOSCOPY (EGD);  Surgeon: Pasty Spillers, MD;  Location: [facility] ENDOSCOPY;  Service: Endoscopy;  Laterality: N/A;   HERNIA REPAIR     X2   IVC FILTER REMOVAL N/A  07/03/2017   Procedure: IVC FILTER REMOVAL;  Surgeon: Annice Needy, MD;  Location: [facility] INVASIVE CV LAB;  Service: Cardiovascular;  Laterality: N/A;   LUMBAR LAMINECTOMY/DECOMPRESSION MICRODISCECTOMY N/A 04/20/2020   Procedure: L2/3, L3/4 LAMINECTOMY;  Surgeon: Lucy Chris, MD;  Location: [facility] ORS;  Service: Neurosurgery;  Laterality: N/A;  2ND CASE   PERIPHERAL VASCULAR THROMBECTOMY Right 05/22/2017   Procedure: PERIPHERAL VASCULAR THROMBECTOMY;  Surgeon: Annice Needy, MD;  Location: [facility] INVASIVE CV LAB;  Service: Cardiovascular;  Laterality: Right;   SPINE SURGERY  December 2021   TONSILLECTOMY     Patient Active Problem List   Diagnosis Date Noted   Insomnia due to medical condition 12/29/2022   Elevated low density lipoprotein (LDL) cholesterol level 12/29/2022   Generalized anxiety disorder 12/29/2022   GERD (gastroesophageal reflux disease) 12/29/2022   ARF (acute renal failure) (HCC) 12/29/2022   Stroke (cerebrum) (HCC) 12/15/2022   Acute thrombotic stroke (HCC) 12/14/2022   Acute delirium 12/14/2022   Overweight (BMI 25.0-29.9) 12/13/2022   Weakness 12/12/2022   Cognitive impairment 12/12/2022   SOB (shortness of breath) 06/02/2022   Acute respiratory failure with hypoxia (HCC) 05/25/2022   Multifocal pneumonia 05/24/2022   Falls 05/24/2022   Acute left-sided low back pain with left-sided sciatica 02/25/2020   Lumbar stenosis  with neurogenic claudication 02/25/2020   Diverticulosis of colon with hemorrhage    Diverticulosis of small intestine without hemorrhage    HTN (hypertension) 08/30/2018   Deep vein thrombosis (DVT) of proximal vein of right lower extremity (HCC) 06/14/2017    ONSET DATE: 12/12/22  REFERRING DIAG: CVA  THERAPY DIAG:  Muscle weakness (generalized)  Other lack of coordination  Rationale for Evaluation and Treatment: Rehabilitation  SUBJECTIVE:   SUBJECTIVE STATEMENT:   Pt./wife report Pt. Did not sleep well last night, and has had  agitation at night.  Pt accompanied by: significant other  PERTINENT HISTORY: NAS DUDZIAK is a 88 y.o. male with medical history significant of cognitive impairment, history of TBI, hypertension presenting with weakness, confusion. Admitted to [facility] 12/12/22 for worsening weakness and cognitive impairment. Per MRI impression: "Scattered small acute to subacute infarcts in the right PCA distribution". Completed inpatient rehab stay from 12/15/22 to 12/28/22.   PRECAUTIONS: Fall  WEIGHT BEARING RESTRICTIONS: No  PAIN:  Are you having pain? No  FALLS: Has patient fallen in last 6 months? Yes. 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  OBJECTIVE:   HAND DOMINANCE: Right  ADLs: Overall ADLs: MIN A + cues for initiation  Transfers/ambulation related to ADLs: SUPERVISION with RW Eating: SETUP Grooming: SETUP + cues for initiation  UB Dressing: SETUP LB Dressing: MIN A Toileting: cueing Bathing: MIN A  Tub Shower transfers: MOD A  Equipment: Shower seat with back  IADLs: Shopping: spouse completes Light housekeeping: spouse completes Meal Prep: spouse completes Community mobility: spouse completes Medication management: spouse completes Landscape architect: spouse completes Handwriting: 100% legible  MOBILITY STATUS: Needs Assist: RW  POSTURE COMMENTS:  No Significant postural limitations  ACTIVITY TOLERANCE: Activity tolerance: 5 min standing tolerance   FUNCTIONAL OUTCOME MEASURES: FOTO: 98  pt demonstrates poor insight into deficits   UPPER EXTREMITY ROM:  WFL   UPPER EXTREMITY MMT:   WFL  HAND FUNCTION: Grip strength: Right: 60 lbs;  Left: 42 lbs, Lateral pinch: Right: 24 lbs, Left: 22 lbs, and 3 point pinch: Right: 20 lbs, Left: 16 lbs  COORDINATION: 9 Hole Peg test: Right: 30 sec; Left: 38 sec  SENSATION: WFL Not tested  EDEMA: none   COGNITION: Overall cognitive status: Impaired Completed clock draw with mildly impaired number spacing, unable to set correct times for minute/hand.  VISION: Subjective report:  Baseline vision: Wears glasses all the time Visual history:   VISION ASSESSMENT: To be further assessed in functional context  Patient has difficulty with following activities due to following visual impairments: L inattention Completed Albert's test with positive indicator for Unilateral Spatial Neglect - 10 lines on L lower/middle quadrant missed   OBSERVATIONS: Pt presents with spouse for OT evaluation.    TODAY'S TREATMENT:  DATE: 02/08/2023  Neuromuscular re-education:  Pt. performed left hand FMC tasks using the grooved pegboard. Pt. worked on grasping the grooved pegs from a horizontal position, and moving the pegs to a vertical position in the hand to prepare for placing them in the grooved slot. Pt. worked on grasping the pegs, and storing them in the palm of his  hand. Pt. worked with the grooved pegboard placed both at the tabletop surface, followed by at a vertical angle positioned to the left to increase left sided awareness.  Therapeutic Activities:  Pt. worked on visual scanning tasks using the structured complex circles search, and random complex circles search. Pt. presented with 6 omissions to the left side of the page on the structured complex circles. Pt. presented with 8 omissions on the random complex circles search to the left, and in the center of the page with the page set at midline. Pt. had 4 misidentifications. Pt. worked on completing each of the  remaining pages modifying the task to line by line with the structured search, and 1/2 the page for the random complex search. Pt. required cues, and the use of an anchor to scan all the way to the left.  Pt. worked on visual scanning tasks, and visual search using black suit playing cards. Pt. worked on identifying pairs, followed by locating sums of 2, and 3 cards.     PATIENT EDUCATION: Education details:left sided awareness,  lateral, and 3pt. Pinch position Person educated: Spouse Education method: Explanation Education comprehension: verbalized understanding  HOME EXERCISE PROGRAM: FMC HEP to be initiated next session   GOALS: Goals reviewed with patient? Yes  SHORT TERM GOALS: Target date: 03/13/23  Pt and caregiver will IND complete HEP for improved grip strength and dexterity.  Baseline: no HEP Goal status: INITIAL    LONG TERM GOALS: Target date: 04/24/23  Pt will increase L grip strength by 10# to open jars. Baseline: L grip 42# Goal status: INITIAL  2.  Pt will increase activity tolerance for standing dynamic tasks to 15 min to assist with IADLs Baseline: 5 min standing Goal status: INITIAL  3.  Pt will IND don pants with no cues from caregiver for sequencing.  Baseline: MIN A to don pants Goal status: INITIAL  4.  Pt/caregiver will verbalize plan to implement x3 visual compensation strategies for improved I/ADL performance.  Baseline: none Goal status: INITIAL  5.  Pt will improve non-dominant LUE dexterity by 10 seconds for return to leisure activity of wood carving. Baseline: 9 hole peg test 38 sec Goal status: INITIAL   ASSESSMENT:  CLINICAL IMPRESSION:  Pt. Arrived to therapy in a w/c this morning due to a dizziness on the way in to the clinic this morning. Pt. has a follow-up MD appointment with Dr. Wynn Banker after therapy today. Pt. reports not sleeping well at night, and his wife reports Pt. Is having agitation at night. Pt. was able to  complete the [facility] task in multiple angles to the left to encourage left sided visual search,scanning the the left, and left sided awareness. Pt. was able to efficiently, and accurately locate card pairs to the left, as well as locate sums on  of 2  cards with 100% accuracy, and sums of 3 cards with 75% accuracy. Pt. presented with 6 omissions to the left side of the page on the structured complex circles. Pt. presented with 8 omissions on the random complex circles search to the left, and in the center of the page with  the page set at midline. Pt. presents with 4 misidentifications. Discussed having the Pt. Try to start to engage in IADL tasks at home that are purposeful, motivating, or volitional for the Pt. Discussed level of supervision needed dependent of the task. Pt continues to benefit from OT to improve engagement of LUE during ADLs, increased tolerance for IADLs, and apply compensatory strategies to decreased caregiver burden.  PERFORMANCE DEFICITS: in functional skills including ADLs, IADLs, and dexterity, cognitive skills including attention, emotional, memory, orientation, problem solving, and safety awareness, and psychosocial skills including coping strategies and environmental adaptation.   IMPAIRMENTS: are limiting patient from ADLs, IADLs, and leisure.   CO-MORBIDITIES: has co-morbidities such as TBI  that affects occupational performance. Patient will benefit from skilled OT to address above impairments and improve overall function.  MODIFICATION OR ASSISTANCE TO COMPLETE EVALUATION: Min-Moderate modification of tasks or assist with assess necessary to complete an evaluation.  OT OCCUPATIONAL PROFILE AND HISTORY: Detailed assessment: Review of records and additional review of physical, cognitive, psychosocial history related to current functional performance.  CLINICAL DECISION MAKING: Moderate - several treatment options, min-mod task modification necessary  REHAB POTENTIAL: Fair  limited by cognition/STM  EVALUATION COMPLEXITY: Moderate    PLAN:  OT FREQUENCY: 1-2x/week  OT DURATION: 12 weeks  PLANNED INTERVENTIONS: self care/ADL training, therapeutic exercise, therapeutic activity, patient/family education, cognitive remediation/compensation, visual/perceptual remediation/compensation, energy conservation, and DME and/or AE instructions  RECOMMENDED OTHER SERVICES: SLP, PT  CONSULTED AND AGREED WITH PLAN OF CARE: Patient and family member/caregiver  PLAN FOR NEXT SESSION: initiate HEP   Olegario Messier, MS, OTR/L 02/09/2023, 3:20 PM

## 2023-02-14 ENCOUNTER — Ambulatory Visit: Payer: Medicare PPO | Admitting: Occupational Therapy

## 2023-02-14 ENCOUNTER — Ambulatory Visit: Payer: Medicare PPO

## 2023-02-14 DIAGNOSIS — M6281 Muscle weakness (generalized): Secondary | ICD-10-CM

## 2023-02-14 DIAGNOSIS — R2681 Unsteadiness on feet: Secondary | ICD-10-CM

## 2023-02-14 DIAGNOSIS — R278 Other lack of coordination: Secondary | ICD-10-CM

## 2023-02-14 DIAGNOSIS — I6389 Other cerebral infarction: Secondary | ICD-10-CM | POA: Diagnosis not present

## 2023-02-14 DIAGNOSIS — R41841 Cognitive communication deficit: Secondary | ICD-10-CM | POA: Diagnosis not present

## 2023-02-14 DIAGNOSIS — R296 Repeated falls: Secondary | ICD-10-CM

## 2023-02-14 DIAGNOSIS — R262 Difficulty in walking, not elsewhere classified: Secondary | ICD-10-CM

## 2023-02-14 NOTE — Therapy (Signed)
OUTPATIENT OCCUPATIONAL THERAPY NEURO TREATMENT NOTE  Patient Name: Clayton [name] MRN: [id number] DOB:02-Oct-1934, 88 y.o., male Today's Date: 02/14/2023  PCP: Clayton [name]  REFERRING PROVIDER: Clydie [name]   END OF SESSION:  OT End of Session - 02/14/23 1719     Visit Number 5    Number of Visits 24    Date for OT Re-Evaluation 04/24/23    OT Start Time 1145    OT Stop Time 1230    OT Time Calculation (min) 45 min    Activity Tolerance Patient tolerated treatment well    Behavior During Therapy [facility] for tasks assessed/performed              Past Medical History:  Diagnosis Date   Acute gastrointestinal hemorrhage    Acute hypoxemic respiratory failure (HCC) 05/25/2022   Acute thrombotic stroke (HCC) 12/14/2022   Basal cell carcinoma    Blood in stool 08/30/2018   Blood transfusion without reported diagnosis 42 (age 7)   After traumatic brain injury   Broken ribs    2 BROKEN ON RIBS   CKD (chronic kidney disease)    PT DENIES   Color blindness    DVT (deep venous thrombosis) (HCC)    RIGHT LEG   Dyslexia    Fracture dislocation of wrist    RIGHT   GI bleed 09/02/2018   Hiatal hernia with GERD    HTN (hypertension)    Hypokalemia 05/26/2022   Hyponatremia 05/25/2022   Symptomatic anemia 09/03/2018   TBI (traumatic brain injury) (HCC)    w/skull Fx and crani in 1960s   Past Surgical History:  Procedure Laterality Date   COLONOSCOPY  2014   cleared for 5 yrs   COLONOSCOPY N/A 09/01/2018   Procedure: COLONOSCOPY;  Surgeon: Clayton Spillers, MD;  Location: [facility] ENDOSCOPY;  Service: Endoscopy;  Laterality: N/A;   CRANIECTOMY FOR DEPRESSED SKULL FRACTURE  1960'S   ESOPHAGOGASTRODUODENOSCOPY N/A 09/01/2018   Procedure: ESOPHAGOGASTRODUODENOSCOPY (EGD);  Surgeon: Clayton Spillers, MD;  Location: [facility] ENDOSCOPY;  Service: Endoscopy;  Laterality: N/A;   HERNIA REPAIR     X2   IVC FILTER REMOVAL N/A 07/03/2017   Procedure: IVC FILTER  REMOVAL;  Surgeon: Clayton Needy, MD;  Location: [facility] INVASIVE CV LAB;  Service: Cardiovascular;  Laterality: N/A;   LUMBAR LAMINECTOMY/DECOMPRESSION MICRODISCECTOMY N/A 04/20/2020   Procedure: L2/3, L3/4 LAMINECTOMY;  Surgeon: Clayton Chris, MD;  Location: [facility] ORS;  Service: Neurosurgery;  Laterality: N/A;  2ND CASE   PERIPHERAL VASCULAR THROMBECTOMY Right 05/22/2017   Procedure: PERIPHERAL VASCULAR THROMBECTOMY;  Surgeon: Clayton Needy, MD;  Location: [facility] INVASIVE CV LAB;  Service: Cardiovascular;  Laterality: Right;   SPINE SURGERY  December 2021   TONSILLECTOMY     Patient Active Problem List   Diagnosis Date Noted   Insomnia due to medical condition 12/29/2022   Elevated low density lipoprotein (LDL) cholesterol level 12/29/2022   Generalized anxiety disorder 12/29/2022   GERD (gastroesophageal reflux disease) 12/29/2022   ARF (acute renal failure) (HCC) 12/29/2022   Stroke (cerebrum) (HCC) 12/15/2022   Acute thrombotic stroke (HCC) 12/14/2022   Acute delirium 12/14/2022   Overweight (BMI 25.0-29.9) 12/13/2022   Weakness 12/12/2022   Cognitive impairment 12/12/2022   SOB (shortness of breath) 06/02/2022   Acute respiratory failure with hypoxia (HCC) 05/25/2022   Multifocal pneumonia 05/24/2022   Falls 05/24/2022   Acute left-sided low back pain with left-sided sciatica 02/25/2020   Lumbar stenosis with neurogenic claudication 02/25/2020   Diverticulosis of  colon with hemorrhage    Diverticulosis of small intestine without hemorrhage    HTN (hypertension) 08/30/2018   Deep vein thrombosis (DVT) of proximal vein of right lower extremity (HCC) 06/14/2017    ONSET DATE: 12/12/22  REFERRING DIAG: CVA  THERAPY DIAG:  Muscle weakness (generalized)  Other lack of coordination  Rationale for Evaluation and Treatment: Rehabilitation  SUBJECTIVE:   SUBJECTIVE STATEMENT:   Pt. Had a follow-up MD appointment last week.  Pt accompanied by: significant other  PERTINENT HISTORY:  Clayton [name] is a 88 y.o. male with medical history significant of cognitive impairment, history of TBI, hypertension presenting with weakness, confusion. Admitted to [facility] 12/12/22 for worsening weakness and cognitive impairment. Per MRI impression: "Scattered small acute to subacute infarcts in the right PCA distribution". Completed inpatient rehab stay from 12/15/22 to 12/28/22.   PRECAUTIONS: Fall  WEIGHT BEARING RESTRICTIONS: No  PAIN:  Are you having pain? No  FALLS: Has patient fallen in last 6 months? Yes. 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  OBJECTIVE:   HAND DOMINANCE: Right  ADLs: Overall ADLs: MIN A + cues for initiation  Transfers/ambulation related to ADLs: SUPERVISION with RW Eating: SETUP Grooming: SETUP + cues for initiation  UB Dressing: SETUP LB Dressing: MIN A Toileting: cueing Bathing: MIN A  Tub Shower transfers: MOD A  Equipment: Shower seat with back  IADLs: Shopping: spouse completes Light housekeeping: spouse completes Meal Prep: spouse completes Community mobility: spouse completes Medication management: spouse completes Landscape architect: spouse completes Handwriting: 100% legible  MOBILITY STATUS: Needs Assist: RW  POSTURE COMMENTS:  No Significant postural limitations  ACTIVITY TOLERANCE: Activity tolerance: 5 min standing tolerance   FUNCTIONAL OUTCOME MEASURES: FOTO: 98  pt demonstrates poor insight into deficits   UPPER EXTREMITY ROM:  WFL   UPPER EXTREMITY MMT:   WFL  HAND FUNCTION: Grip strength: Right: 60 lbs; Left: 42 lbs, Lateral pinch: Right: 24 lbs, Left: 22 lbs, and 3 point pinch: Right:  20 lbs, Left: 16 lbs  COORDINATION: 9 Hole Peg test: Right: 30 sec; Left: 38 sec  SENSATION: WFL Not tested  EDEMA: none   COGNITION: Overall cognitive status: Impaired Completed clock draw with mildly impaired number spacing, unable to set correct times for minute/hand.  VISION: Subjective report:  Baseline vision: Wears glasses all the time Visual history:   VISION ASSESSMENT: To be further assessed in functional context  Patient has difficulty with following activities due to following visual impairments: L inattention Completed Albert's test with positive indicator for Unilateral Spatial Neglect - 10 lines on L lower/middle quadrant missed   OBSERVATIONS: Pt presents with spouse for OT evaluation.    TODAY'S TREATMENT:  DATE: 02/14/2023  Neuromuscular re-education:  Pt. 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 Pt. worked on pushing them through pink resistive theraputty  then upgraded to green resistive theraputty for more resistance. Pt. Worked on holding the theraputty with the left hand, while using the tweezers with the right hand.   Therapeutic Ex:   Pt. worked on green theraputty ex. for bilateral hand strengthening. Exercises including: gross gripping, gross digit extension, lateral, and 3pt. Pinch strengthening, and thumb opposition. Pt. Was provided with a visual handout HEP through [employer].     PATIENT EDUCATION: Education details:left sided awareness,  lateral, and 3pt. Pinch position Person educated: Spouse Education method: Explanation Education comprehension: verbalized understanding  HOME EXERCISE  PROGRAM: [facility] HEP to be initiated next session   GOALS: Goals reviewed with patient? Yes  SHORT TERM GOALS: Target date: 03/13/23  Pt and caregiver will IND complete HEP for improved grip strength and dexterity.  Baseline: no HEP Goal status: INITIAL    LONG TERM GOALS: Target date: 04/24/23  Pt will increase L grip strength by 10# to open jars. Baseline: L grip 42# Goal status: INITIAL  2.  Pt will increase activity tolerance for standing dynamic tasks to 15 min to assist with IADLs Baseline: 5 min standing Goal status: INITIAL  3.  Pt will IND don pants with no cues from caregiver for sequencing.  Baseline: MIN A to don pants Goal status: INITIAL  4.  Pt/caregiver will verbalize plan to implement x3 visual compensation strategies for improved I/ADL performance.  Baseline: none Goal status: INITIAL  5.  Pt will improve non-dominant LUE dexterity by 10 seconds for return to leisure activity of wood carving. Baseline: 9 hole peg test 38 sec Goal status: INITIAL   ASSESSMENT:  CLINICAL IMPRESSION:  Pt. was able to modulate the amount of graded pressure on the tweezers required to pushed the pegs through the resistance. Pt. as able to accommodate his grasp on the tweezers consistently when manipulating the tweezers, and positioning each peg into the resistive putty. Pt. required verbal cues, visual cues, and tactile cues for the hand, and digit position during the theraputty exercises. Pt continues to benefit from OT services to improve engagement of LUE during ADLs, increased tolerance for IADLs, and apply compensatory strategies to decreased caregiver burden.  PERFORMANCE DEFICITS: in functional skills including ADLs, IADLs, and dexterity, cognitive skills including attention, emotional, memory, orientation, problem solving, and safety awareness, and psychosocial skills including coping strategies and environmental adaptation.   IMPAIRMENTS: are limiting patient from ADLs,  IADLs, and leisure.   CO-MORBIDITIES: has co-morbidities such as TBI  that affects occupational performance. Patient will benefit from skilled OT to address above impairments and improve overall function.  MODIFICATION OR ASSISTANCE TO COMPLETE EVALUATION: Min-Moderate modification of tasks or assist with assess necessary to complete an evaluation.  OT OCCUPATIONAL PROFILE AND HISTORY: Detailed assessment: Review of records and additional review of physical, cognitive, psychosocial history related to current functional performance.  CLINICAL DECISION MAKING: Moderate - several treatment options, min-mod task modification necessary  REHAB POTENTIAL: Fair limited by cognition/STM  EVALUATION COMPLEXITY: Moderate    PLAN:  OT FREQUENCY: 1-2x/week  OT DURATION: 12 weeks  PLANNED INTERVENTIONS: self care/ADL training, therapeutic exercise, therapeutic activity, patient/family education, cognitive remediation/compensation, visual/perceptual remediation/compensation, energy conservation, and DME  and/or AE instructions  RECOMMENDED OTHER SERVICES: SLP, PT  CONSULTED AND AGREED WITH PLAN OF CARE: Patient and family member/caregiver  PLAN FOR NEXT SESSION: initiate HEP   Olegario Messier, MS, OTR/L 02/14/2023, 5:23 PM

## 2023-02-14 NOTE — Therapy (Addendum)
OUTPATIENT PHYSICAL THERAPY TREATMENT   Patient Name: Clayton "Clayton [name] MRN: [id number] DOB:[birth year], 88 y.o., male Today's Date: 02/14/2023  END OF SESSION:.  PT End of Session - 02/14/23 1106     Visit Number 4    Number of Visits 16    Date for PT Re-Evaluation 04/01/23    Authorization Type [employer] Choice PPO    Authorization Time Period 01/30/23-04/01/23    Progress Note Due on Visit 10    PT Start Time 1102    PT Stop Time 1142    PT Time Calculation (min) 40 min    Equipment Utilized During Treatment Gait belt    Activity Tolerance Patient tolerated treatment well;No increased pain    Behavior During Therapy [facility] for tasks assessed/performed            Past Medical History:  Diagnosis Date   Acute gastrointestinal hemorrhage     Acute hypoxemic respiratory failure (HCC) 05/25/2022   Acute thrombotic stroke (HCC) 12/14/2022   Basal cell carcinoma     Blood in stool 08/30/2018   Blood transfusion without reported diagnosis 47 (age 63)    After traumatic brain injury   Broken ribs      2 BROKEN ON RIBS   CKD (chronic kidney disease)      PT DENIES   Color blindness     DVT (deep venous thrombosis) (HCC)      RIGHT LEG   Dyslexia     Fracture dislocation of wrist      RIGHT   GI bleed 09/02/2018   Hiatal hernia with GERD     HTN (hypertension)     Hypokalemia 05/26/2022   Hyponatremia 05/25/2022   Symptomatic anemia 09/03/2018   TBI (traumatic brain injury) (HCC)      w/skull Fx and crani in 1960s                  Past Surgical History:  Procedure Laterality Date   COLONOSCOPY   2014    cleared for 5 yrs   COLONOSCOPY N/A 09/01/2018    Procedure: COLONOSCOPY;  Surgeon: Clayton Spillers, MD;  Location: [facility] ENDOSCOPY;  Service: Endoscopy;  Laterality: N/A;   CRANIECTOMY FOR DEPRESSED SKULL FRACTURE   1960'S   ESOPHAGOGASTRODUODENOSCOPY N/A 09/01/2018    Procedure: ESOPHAGOGASTRODUODENOSCOPY (EGD);  Surgeon: Clayton Spillers, MD;  Location: [facility] ENDOSCOPY;  Service: Endoscopy;  Laterality: N/A;   HERNIA REPAIR        X2   IVC FILTER REMOVAL N/A 07/03/2017    Procedure: IVC FILTER REMOVAL;  Surgeon: Clayton Needy, MD;  Location: [facility] INVASIVE CV LAB;  Service: Cardiovascular;  Laterality: N/A;   LUMBAR LAMINECTOMY/DECOMPRESSION MICRODISCECTOMY N/A 04/20/2020    Procedure: L2/3, L3/4 LAMINECTOMY;  Surgeon: Clayton Chris, MD;  Location: [facility] ORS;  Service: Neurosurgery;  Laterality: N/A;  2ND CASE   PERIPHERAL VASCULAR THROMBECTOMY Right 05/22/2017    Procedure: PERIPHERAL VASCULAR THROMBECTOMY;  Surgeon: Clayton Needy, MD;  Location: [facility] INVASIVE CV LAB;  Service: Cardiovascular;  Laterality: Right;   SPINE SURGERY   December 2021   TONSILLECTOMY                    Patient Active Problem List    Diagnosis Date Noted   Insomnia due to medical condition 12/29/2022   Elevated low density lipoprotein (LDL) cholesterol level 12/29/2022   Generalized anxiety disorder 12/29/2022   GERD (gastroesophageal reflux disease) 12/29/2022   ARF (acute renal failure) (  HCC) 12/29/2022   Stroke (cerebrum) (HCC) 12/15/2022   Acute thrombotic stroke (HCC) 12/14/2022   Acute delirium 12/14/2022   Overweight (BMI 25.0-29.9) 12/13/2022   Weakness 12/12/2022   Cognitive impairment 12/12/2022   SOB (shortness of breath) 06/02/2022   Acute respiratory failure with hypoxia (HCC) 05/25/2022   Multifocal pneumonia 05/24/2022   Falls 05/24/2022   Acute left-sided low back pain with left-sided sciatica 02/25/2020   Lumbar stenosis with neurogenic claudication 02/25/2020   Diverticulosis of colon with hemorrhage     Diverticulosis of small intestine without hemorrhage     HTN (hypertension) 08/30/2018   Deep vein thrombosis (DVT) of proximal vein of right lower extremity (HCC) 06/14/2017        PCP: Clayton Garter MD  REFERRING PROVIDER: Wynn Banker MD  REFERRING DIAG: CVA  Rationale for Evaluation and Treatment:  Rehabilitation  THERAPY DIAG:  No diagnosis found.  ONSET DATE: 12/12/22  SUBJECTIVE:                                                                                                                                                                                           SUBJECTIVE STATEMENT: Patient reports doing well today but wife did report 3 falls - (once thurs night and 2 on Friday-at night- just restless - trying to get up from bed - drink water or or go to bathroom) -    PERTINENT HISTORY:  Clayton [name] is a 88 y.o. male with medical history significant of cognitive impairment, history of TBI, hypertension presenting with weakness, confusion. Admitted to [facility] 12/12/22 for worsening weakness and cognitive impairment. Per MRI impression: "Scattered small acute to subacute infarcts in the right PCA distribution". Prior Level of Function : Independent/Modified Independent;Working/employed. Per wife: pt able to walk around without issue, Indepentdent without use of AD, however has a history of severla falls in past 2 years which she attributes to his bilat sensory neuropathy. He has been largely noncompliant with RW recommendations since Feb 2024. Pt DC to inpatient rehab in [place], then home with HHPT.   PAIN:  Are you having pain? No  PRECAUTIONS: Fall   WEIGHT BEARING RESTRICTIONS: No  FALLS:  Has patient fallen in last 6 months? Yes. Number of falls: unsure  LIVING ENVIRONMENT: Lives with: Wife Stairs: Yes: 8 entry stairs with 1 railing  Has following equipment at home: Dan Humphreys - 2 wheeled  OCCUPATION: Radiographer, therapeutic" makes crafts and art   PLOF: Independent with basic ADLs  PATIENT GOALS: Wife desires improved safety and stamina in the home, falls avoidance   NEXT MD VISIT: unknown  OBJECTIVE:   PATIENT SURVEYS:  FOTO: not  appropriate due to memory/awareness deficits   COGNITION: Overall cognitive status: memory/awareness deficits     SENSATION: Per wife:  sensory neuropathy BLE   FUNCTIONAL TESTS:  -5xSTS: 14.76sec hands free, no LOB  - : 956ft (several LOB when turning), no device, needs cues for navigation due to memory deficits  -TUG: 9.88sec, no device, no LOB   Balance Assessment: -SLS: ~3 sec bilat (large amplitude righting, full body)  -eyes closed firm: 30sec, mild to no sway -eyes closed foam: ~20 sec (sway uncontrolled), heavy sway increase -Berg Balance Test: pending     TODAY'S TREATMENT:                                                                                                                              DATE: 02/14/23    THEREX:   Seated Hip march 4# AW- 10 reps Seated Knee ext 4# AW with 3 sec hold x 10 reps Standing hip abd 4# AW x 10 reps alt LE Seated Ham curl (GTB) x 10 reps Seated calf raises 3# x 10 reps- Left calf tight/pain  Resistive gait: 160 feet- 4# AW- shuffling gait x 3 trials BERG= 40/56            PATIENT EDUCATION:  Education details: Discussion on blood pressure and how it can relate to dizziness; Exercise techniqueintroduction to HEP for general LE strengthening Person educated: Patient, wife  Education method: We spoke about it Education comprehension: Pt verbalized understanding of message, however anticipate need to repeat and emphasize message due to empirical evidence that pt's only recall ~70% of education from their provider.   HOME EXERCISE PROGRAM: Access Code: [email or web address] URL: [email or web address] Date: 02/02/2023 Prepared by: Maureen Ralphs  Exercises - Sit to Stand with Arms Crossed  - 3 x weekly - 3 sets - 10 reps - Standing Hip Abduction with Counter Support  - 3 x weekly - 3 sets - 10 reps - Standing Heel Raises  - 3 x weekly - 3 sets - 10 reps - 3 hold  ASSESSMENT:  CLINICAL IMPRESSION: Patient presents with good motivation and more talkative today. He presented with slight decrease in BERG balance test score with more unsteadiness  today. He also presented with increased gait shuffling today - but did improve with VC to pick feet up. He was able to progress to more standing therex today as seen by increased resistance and able to resume standing activities.  Patient will benefit from continued skilled PT services to improve his overall functional LE strength and mobility to decrease fall risk and improve overall quality of life.   OBJECTIVE IMPAIRMENTS: Abnormal gait, cardiopulmonary status limiting activity, decreased activity tolerance, decreased balance, decreased coordination, decreased mobility, difficulty walking, decreased strength, impaired flexibility, impaired sensation, impaired vision/perception, improper body mechanics, and postural dysfunction.  ACTIVITY LIMITATIONS: carrying, lifting, bending, sitting, standing, squatting, stairs, transfers, bed mobility, dressing, reach overhead, hygiene/grooming, locomotion level, and caring for others  PARTICIPATION LIMITATIONS: meal prep,  cleaning, laundry, personal finances, driving, shopping, community activity, occupation, and yard work  PERSONAL FACTORS: Age, Fitness, Past/current experiences, Time since onset of injury/illness/exacerbation, and 3+ comorbidities REHAB POTENTIAL: Good  CLINICAL DECISION MAKING: Evolving/moderate complexity  EVALUATION COMPLEXITY: moderate  SHORT TERM GOALS: Target date: 03/01/23  Patient to report compliance and success with home based program as prescribed by this site in order to help with pt self-efficacy in outcomes and symptoms management.  Baseline: HEP to be issued at visit 2 Goal status: INITIAL    LONG TERM GOALS: Target date: 11/30  Pt to demonstrate improved power in legs, activity tolerance, and dynamic balance AEB improved 5xSTS and TUG.  Baseline: eval: 5xSTS hands free: 15+seconds; TUG c device in 9.86 sec Goal status: INITIAL  2.  Pt to demonstrate improved tolerance and distance in by >12% to indicate improved  ability to perform tasks in community for IADL.   Baseline: eval: : 939ft, no device, several LOB with turning Goal status: INITIAL  3.  Pt to report more consistent use of 2-hand AD wen performing AMB outside of the house.  Baseline: intemrittent, inconsistent use of DME when AMB out of house.  Goal status: INITIAL  4. Pt will improve BERG by at least 3 points in order to demonstrate clinically significant improvement in balance.   Baseline: 02/02/2023= 42/56; 02/14/2023= 40/56  Goal Status: ONGOING PLAN:  PT FREQUENCY: 2x/week  PT DURATION: 2 months   PLANNED INTERVENTIONS: Therapeutic exercises, Therapeutic activity, Neuromuscular re-education, Balance training, Gait training, Patient/Family education, Self Care, and Joint mobilization.  PLAN FOR NEXT SESSION:  dual tasking, AMB with RW , progress therex and balance training as appropriate.    3:02 PM, 02/14/23 Louis Meckel, PT Physical Therapist - [facility]  Outpatient Physical Therapy- Main Campus [phone]   '

## 2023-02-16 ENCOUNTER — Ambulatory Visit: Payer: Medicare PPO

## 2023-02-16 ENCOUNTER — Ambulatory Visit: Payer: Medicare PPO | Admitting: Speech Pathology

## 2023-02-16 DIAGNOSIS — R41841 Cognitive communication deficit: Secondary | ICD-10-CM

## 2023-02-16 DIAGNOSIS — R2681 Unsteadiness on feet: Secondary | ICD-10-CM | POA: Diagnosis not present

## 2023-02-16 DIAGNOSIS — R262 Difficulty in walking, not elsewhere classified: Secondary | ICD-10-CM | POA: Diagnosis not present

## 2023-02-16 DIAGNOSIS — R413 Other amnesia: Secondary | ICD-10-CM | POA: Diagnosis not present

## 2023-02-16 DIAGNOSIS — R278 Other lack of coordination: Secondary | ICD-10-CM

## 2023-02-16 DIAGNOSIS — R296 Repeated falls: Secondary | ICD-10-CM | POA: Diagnosis not present

## 2023-02-16 DIAGNOSIS — I6389 Other cerebral infarction: Secondary | ICD-10-CM

## 2023-02-16 DIAGNOSIS — R2689 Other abnormalities of gait and mobility: Secondary | ICD-10-CM | POA: Diagnosis not present

## 2023-02-16 DIAGNOSIS — F39 Unspecified mood [affective] disorder: Secondary | ICD-10-CM | POA: Diagnosis not present

## 2023-02-16 DIAGNOSIS — R451 Restlessness and agitation: Secondary | ICD-10-CM | POA: Diagnosis not present

## 2023-02-16 DIAGNOSIS — M6281 Muscle weakness (generalized): Secondary | ICD-10-CM | POA: Diagnosis not present

## 2023-02-16 DIAGNOSIS — R200 Anesthesia of skin: Secondary | ICD-10-CM | POA: Diagnosis not present

## 2023-02-16 DIAGNOSIS — F05 Delirium due to known physiological condition: Secondary | ICD-10-CM | POA: Diagnosis not present

## 2023-02-16 DIAGNOSIS — I639 Cerebral infarction, unspecified: Secondary | ICD-10-CM | POA: Diagnosis not present

## 2023-02-16 DIAGNOSIS — R202 Paresthesia of skin: Secondary | ICD-10-CM | POA: Diagnosis not present

## 2023-02-16 DIAGNOSIS — G479 Sleep disorder, unspecified: Secondary | ICD-10-CM | POA: Diagnosis not present

## 2023-02-16 NOTE — Therapy (Addendum)
OUTPATIENT PHYSICAL THERAPY TREATMENT  Patient Name: Clayton [name] MRN: [id number] DOB:1934-07-19, 75, Male Today's Date: 02/16/2023  END OF SESSION:.  PT End of Session - 02/16/23 1314     Visit Number 5    Number of Visits 16    Date for PT Re-Evaluation 04/01/23    Authorization Type [employer] Medicare Choice PPO    Authorization Time Period 01/30/23-04/01/23    Progress Note Due on Visit 10    PT Start Time 0847    PT Stop Time 0929    PT Time Calculation (min) 42 min    Equipment Utilized During Treatment Gait belt    Activity Tolerance Patient tolerated treatment well;No increased pain    Behavior During Therapy [facility] for tasks assessed/performed              *SmartPhrase corrected 04/19/23     Past Medical History:  Diagnosis Date   Acute gastrointestinal hemorrhage     Acute hypoxemic respiratory failure (HCC) 05/25/2022   Acute thrombotic stroke (HCC) 12/14/2022   Basal cell carcinoma     Blood in stool 08/30/2018   Blood transfusion without reported diagnosis 14 (age 38)    After traumatic brain injury   Broken ribs      2 BROKEN ON RIBS   CKD (chronic kidney disease)      PT DENIES   Color blindness     DVT (deep venous thrombosis) (HCC)      RIGHT LEG   Dyslexia     Fracture dislocation of wrist      RIGHT   GI bleed 09/02/2018   Hiatal hernia with GERD     HTN (hypertension)     Hypokalemia 05/26/2022   Hyponatremia 05/25/2022   Symptomatic anemia 09/03/2018   TBI (traumatic brain injury) (HCC)      w/skull Fx and crani in 1960s          *SmartPhrase corrected 04/19/23      Past Surgical History:  Procedure Laterality Date   COLONOSCOPY   2014    cleared for 5 yrs   COLONOSCOPY N/A 09/01/2018    Procedure: COLONOSCOPY;  Surgeon: Clayton Spillers, MD;  Location: [facility] ENDOSCOPY;  Service: Endoscopy;  Laterality: N/A;   CRANIECTOMY FOR DEPRESSED SKULL FRACTURE   1960'S   ESOPHAGOGASTRODUODENOSCOPY N/A 09/01/2018    Procedure:  ESOPHAGOGASTRODUODENOSCOPY (EGD);  Surgeon: Clayton Spillers, MD;  Location: [facility] ENDOSCOPY;  Service: Endoscopy;  Laterality: N/A;   HERNIA REPAIR        X2   IVC FILTER REMOVAL N/A 07/03/2017    Procedure: IVC FILTER REMOVAL;  Surgeon: Clayton Needy, MD;  Location: [facility] INVASIVE CV LAB;  Service: Cardiovascular;  Laterality: N/A;   LUMBAR LAMINECTOMY/DECOMPRESSION MICRODISCECTOMY N/A 04/20/2020    Procedure: L2/3, L3/4 LAMINECTOMY;  Surgeon: Clayton Chris, MD;  Location: [facility] ORS;  Service: Neurosurgery;  Laterality: N/A;  2ND CASE   PERIPHERAL VASCULAR THROMBECTOMY Right 05/22/2017    Procedure: PERIPHERAL VASCULAR THROMBECTOMY;  Surgeon: Clayton Needy, MD;  Location: [facility] INVASIVE CV LAB;  Service: Cardiovascular;  Laterality: Right;   SPINE SURGERY   December 2021   TONSILLECTOMY                     PCP: Clayton Garter MD  REFERRING PROVIDER: Wynn Banker MD  REFERRING DIAG: CVA  Rationale for Evaluation and Treatment: Rehabilitation  THERAPY DIAG:  No diagnosis found.  ONSET DATE: 12/12/22  SUBJECTIVE:  SUBJECTIVE STATEMENT: No other falls since last visit. Pt spouse reports pt not exercising much, states, "not feeling the need to move."   PERTINENT HISTORY:  Clayton [name] is a 88 y.o. male with medical history significant of cognitive impairment, history of TBI, hypertension presenting with weakness, confusion. Admitted to [facility] 12/12/22 for worsening weakness and cognitive impairment. Per MRI impression: "Scattered small acute to subacute infarcts in the right PCA distribution". Prior Level of Function : Independent/Modified Independent;Working/employed. Per wife: pt able to walk around without issue, Indepentdent without use of AD, however has a history of severla falls in past 2 years which  she attributes to his bilat sensory neuropathy. He has been largely noncompliant with RW recommendations since Feb 2024. Pt DC to inpatient rehab in [place], then home with HHPT.   PAIN:  Are you having pain? No  PRECAUTIONS: Fall   WEIGHT BEARING RESTRICTIONS: No  FALLS:  Has patient fallen in last 6 months? Yes. Number of falls: unsure  LIVING ENVIRONMENT: Lives with: Wife Stairs: Yes: 8 entry stairs with 1 railing  Has following equipment at home: Dan Humphreys - 2 wheeled  OCCUPATION: Radiographer, therapeutic" makes crafts and art   PLOF: Independent with basic ADLs  PATIENT GOALS: Wife desires improved safety and stamina in the home, falls avoidance   NEXT MD VISIT: unknown  OBJECTIVE:   PATIENT SURVEYS:  FOTO: not appropriate due to memory/awareness deficits   COGNITION: Overall cognitive status: memory/awareness deficits     SENSATION: Per wife: sensory neuropathy BLE   FUNCTIONAL TESTS:  -5xSTS: 14.76sec hands free, no LOB  - : 982ft (several LOB when turning), no device, needs cues for navigation due to memory deficits  -TUG: 9.88sec, no device, no LOB   Balance Assessment: -SLS: ~3 sec bilat (large amplitude righting, full body)  -eyes closed firm: 30sec, mild to no sway -eyes closed foam: ~20 sec (sway uncontrolled), heavy sway increase -Berg Balance Test: pending     TODAY'S TREATMENT:                                                                                                                              DATE: 02/16/23   THEREX:   Seated Hip march 4# AW- 15x, 6x each LE  Seated Knee ext 4# AW with 3 sec hold x 10 reps /LE Seated Ham curl (GTB) x 10 reps, 15x each LE  Standing hip abd 4# AW x 12, 6x reps alt LE Mini squats 10x  Seated Ham curl (GTB) x 10 reps Seated calf raises 4# x 2x10-12 reps- no pain today  Endurance/strength with 4# AW with RW 2x148 ft cuing for heel-toe sequencing/heel strike. Fatiguing but able to quickly correct technique with  cuing    PATIENT EDUCATION:  Education details: Pt educated throughout session about proper posture and technique with exercises. Improved exercise technique, movement at target joints, use of target muscles after min to mod verbal, visual, tactile cues.  Person educated: Patient, wife  Education method: We spoke about it Education comprehension: Pt verbalized understanding of message, however anticipate need to repeat and emphasize message due to empirical evidence that pt's only recall ~70% of education from their provider.   HOME EXERCISE PROGRAM: Access Code: [email or web address] URL: [email or web address] Date: 02/02/2023 Prepared by: Maureen Ralphs  Exercises - Sit to Stand with Arms Crossed  - 3 x weekly - 3 sets - 10 reps - Standing Hip Abduction with Counter Support  - 3 x weekly - 3 sets - 10 reps - Standing Heel Raises  - 3 x weekly - 3 sets - 10 reps - 3 hold  ASSESSMENT:  CLINICAL IMPRESSION: Continued plan as laid out in recent treatment sessions. Pt able to progress some exercises with increased reps completed today. Pt also shows within-session improvement with gait mechanics following cues. He is most limited by decreased endurance/fatigue. Patient will benefit from continued skilled PT services to improve his overall functional LE strength and mobility to decrease fall risk and improve overall quality of life.   OBJECTIVE IMPAIRMENTS: Abnormal gait, cardiopulmonary status limiting activity, decreased activity tolerance, decreased balance, decreased coordination, decreased mobility, difficulty walking, decreased strength, impaired flexibility, impaired sensation, impaired vision/perception, improper body mechanics, and postural dysfunction.  ACTIVITY LIMITATIONS: carrying, lifting, bending, sitting, standing, squatting, stairs, transfers, bed mobility, dressing, reach overhead, hygiene/grooming, locomotion level, and caring for others  PARTICIPATION LIMITATIONS:  meal prep, cleaning, laundry, personal finances, driving, shopping, community activity, occupation, and yard work  PERSONAL FACTORS: Age, Fitness, Past/current experiences, Time since onset of injury/illness/exacerbation, and 3+ comorbidities REHAB POTENTIAL: Good  CLINICAL DECISION MAKING: Evolving/moderate complexity  EVALUATION COMPLEXITY: moderate  SHORT TERM GOALS: Target date: 03/01/23  Patient to report compliance and success with home based program as prescribed by this site in order to help with pt self-efficacy in outcomes and symptoms management.  Baseline: HEP to be issued at visit 2 Goal status: INITIAL    LONG TERM GOALS: Target date: 11/30  Pt to demonstrate improved power in legs, activity tolerance, and dynamic balance AEB improved 5xSTS and TUG.  Baseline: eval: 5xSTS hands free: 15+seconds; TUG c device in 9.86 sec Goal status: INITIAL  2.  Pt to demonstrate improved tolerance and distance in by >12% to indicate improved ability to perform tasks in community for IADL.   Baseline: eval: : 982ft, no device, several LOB with turning Goal status: INITIAL  3.  Pt to report more consistent use of 2-hand AD wen performing AMB outside of the house.  Baseline: intemrittent, inconsistent use of DME when AMB out of house.  Goal status: INITIAL  4. Pt will improve BERG by at least 3 points in order to demonstrate clinically significant improvement in balance.   Baseline: 02/02/2023= 42/56; 02/14/2023= 40/56  Goal Status: ONGOING PLAN:  PT FREQUENCY: 2x/week  PT DURATION: 2 months   PLANNED INTERVENTIONS: Therapeutic exercises, Therapeutic activity, Neuromuscular re-education, Balance training, Gait training, Patient/Family education, Self Care, and Joint mobilization.  PLAN FOR NEXT SESSION:  dual tasking, AMB with RW , progress therex and balance training as appropriate.    1:16 PM, 02/16/23 Temple Pacini PT, DPT  Physical Therapist - [facility]  Outpatient Physical Therapy- Main Campus [phone]   '

## 2023-02-16 NOTE — Therapy (Signed)
OUTPATIENT SPEECH LANGUAGE PATHOLOGY  TREATMENT NOTE   Patient Name: Clayton [name] MRN: [id number] DOB:01-25-1935, 88 y.o., male Today's Date: 02/16/2023  PCP: Leonor Liv, MD REFERRING PROVIDER: Claudette Laws, MD   End of Session - 02/16/23 1128     Visit Number 4    Number of Visits 25    Date for SLP Re-Evaluation 04/24/23    Authorization Type [employer] Choice PPO    Authorization Time Period 01/30/2023 thru 03/31/2023    Authorization - Visit Number 4    Authorization - Number of Visits 18    Progress Note Due on Visit 10    SLP Start Time 0930    SLP Stop Time  1015    SLP Time Calculation (min) 45 min    Activity Tolerance Patient tolerated treatment well             No past medical history on file.  The histories are not reviewed yet. Please review them in the "History" navigator section and refresh this SmartLink. Patient Active Problem List   Diagnosis Date Noted   Insomnia due to medical condition 12/29/2022   Elevated low density lipoprotein (LDL) cholesterol level 12/29/2022   Generalized anxiety disorder 12/29/2022   GERD (gastroesophageal reflux disease) 12/29/2022   ARF (acute renal failure) (HCC) 12/29/2022   Stroke (cerebrum) (HCC) 12/15/2022   Acute thrombotic stroke (HCC) 12/14/2022   Acute delirium 12/14/2022   Overweight (BMI 25.0-29.9) 12/13/2022   Weakness 12/12/2022   Cognitive impairment 12/12/2022   SOB (shortness of breath) 06/02/2022   Acute respiratory failure with hypoxia (HCC) 05/25/2022   Multifocal pneumonia 05/24/2022   Falls 05/24/2022   Acute left-sided low back pain with left-sided sciatica 02/25/2020   Lumbar stenosis with neurogenic claudication 02/25/2020   Diverticulosis of colon with hemorrhage    Diverticulosis of small intestine without hemorrhage    HTN (hypertension) 08/30/2018   Deep vein thrombosis (DVT) of proximal vein of right lower extremity (HCC) 06/14/2017    ONSET DATE: pt's wife  reports gradual decline ~ 2 years ago, Cognitive Impairment initially dx on 12/12/2022; Right PCA CVA 12/12/2022; date of referral 01/16/2023   REFERRING DIAG: I63.531 (ICD-10-CM) - Acute right PCA stroke (HCC)  THERAPY DIAG:  Cognitive communication deficit  Cerebrovascular accident (CVA) due to other mechanism [facility])  Rationale for Evaluation and Treatment Rehabilitation  SUBJECTIVE:   PERTINENT HISTORY and DIAGNOSTIC FINDINGS: Pt is a 88 year old male with past medical history significant for TBI, hypertension, hyperlipidemia with multiple hospitalizations in January-February 2024 for pneumonia. While pt's wife reports gradual decrease in pt's vocal intensity and cognition, she reports singificant decline in memory and voice following this hospitalization (chart review reveals ambulatory referral made to neurology for concern regarding "mild cognitive impairment and dementia"). Pt seen by outpatient neurology on 06/28/2022 with MRI scheduled d/t concern with worsening memory loss. MRI 07/13/2022 - IMPRESSION: 1. No acute abnormality or reversible cause of memory loss. 2.  Cerebral atrophy. Pt with follow up appointment with Outpatient Neurology on 08/15/2022 with improved restlessness at night and mildly improved mood, ongiong unsteadiness while walking and unchanged memory deficits. Per chart, "discussed at length prognosis and safety concerns in setting of mild to moderate dementia." On 12/12/2022 pt [facility] ED on 12/12/2022 with confusion. MRI confirmed (12/12/2022) FINDINGS: Small areas of restricted diffusion in the right occipital cortex, right hippocampus, and right thalamus. Other than the occipital cortex, diffusion restriction is fairly weak. Symmetric diffusion hyperintensity along the periphery of the  bilateral cerebellum is likely artifactual. Generalized cerebral volume loss. No acute hemorrhage, hydrocephalus, mass, or collection. Mild chronic small vessel ischemia in the  cerebral white matter. IMPRESSION: 1. Scattered small acute to subacute infarcts in the right PCA distribution 2. Generalized brain atrophy. [facility] until 12/28/2022. Pt with homehealth therapies in interm of Outpatient services. Follow up outpatient neurology on 01/17/2023 reveales worsening short and long term meory loss "memory evaluation today is 11/30."   This is in a largely unaided person with documented moderate bilateral sensory neural hearing loss (2015).   PAIN:  Are you having pain? No   FALLS: Has patient fallen in last 6 months?  See PT evaluation for details  LIVING ENVIRONMENT: Lives with: lives with their spouse Lives in: House/apartment  PLOF:  Level of assistance: Needed assistance with ADLs, Needed assistance with IADLS Employment: Retired   PATIENT GOALS   caregiver expresses desire to be able to facilitate recovery better  SUBJECTIVE STATEMENT: Pt arrived to session reporting that he was feeling "ok"  Pt accompanied by: significant other  OBJECTIVE:     TODAY'S TREATMENT:  Skilled treatment session focused on pt's cognitive communication goals. SLP facilitated session by providing the following interventions:  After initial greetings, pt's wife appeared distraught and stated "before I leave today, someone has got to know this, I cannot be the only one to know this about Peyton Najjar."    With pt permission, a space was created for active listening while she reported the following information:  She provided information about herself - describing her self as having autism, very literal,  with ADHD, anxiety and depression.   She describes pt having "an overwhelming emotional response" (between 4pm to 6pm) "45 minutes." Over the last week, this has progressed to him shouting "I WANT TO DIE, I WANT TO DIE, I AM NOT GOING TO DO IT, I WANT TO DIE." This also intermittently occurs in the morning after he gets up. In addition, she reports that his  restlessness at night doesn't appear well controlled with seroquel. She reports "I am watching him all night long" - he is agitated, gets up walks around, asks "who lives here" and intermittently doesn't recognize her at night. This typically lasts from evening to around 4am.   In our session, she continually stated "I am not capable I cannot be the only one who knows this" and "I want people to know that while I am an awesome caregiver in some ways, the level of responsibility that I feel for Larry's well being is too much, I am going to fold."   In response, this writer called neurologist's office during session with appt available at 1:30pm today. This information was shared very secure email to this provided.   This Clinical research associate also had caregiver identify 1 activity that would help her today - she identified that having someone come stay with Peyton Najjar while she went for a walk would be helpful. This Clinical research associate also helped her identify who she could reach out to for to come visit. Written plan for provided to help her organize this.    PATIENT EDUCATION: Education details: need to wear hearing aids, stroke recovery, dementia and cognition, ST POC Person educated: Patient and Spouse Education method: Explanation Education comprehension: needs further education  HOME EXERCISE PROGRAM:        Wear hearing aids    GOALS:  Goals reviewed with patient? Yes  SHORT TERM GOALS: Target date: 10 sessions  Pt will  complete formal cognitive communication evaluation.  Baseline: Goal status: INITIAL   2.  Pt will refer to external memory aids for accurate recall of orientation information in 6 out of 7 opportunities.  Baseline: oriented to self and town Goal status: INITIAL  3.  With Min A, pt will complete Expiratory Muscle Training (EMT) across 30 trials i at 50-70% of MEP improve coordination of respiration and phonation and maximize breath support for speech. Baseline:  Goal status: INITIAL  4.   With Moderate assistance, pt will utilize speech intelligibility strategies to achieve > 80% speech intelligibility at the sentence level.  Baseline:  Goal status: INITIAL   LONG TERM GOALS: Target date: 04/24/2023  With Min A, patient will use external aids to manage appointments, chores, shopping, and log vital signs.    Baseline:  Goal status: INITIAL  2.  Pt will report improved cognitive communication via PROM by 5 points at last ST session   Baseline:  Goal status: INITIAL  3.  Pt will complete HEP with moderate assistance across 5 out of 7 opportunities.  Baseline:  Goal status: INITIAL   ASSESSMENT:  CLINICAL IMPRESSION:  Patient is a 88 y.o. male who was seen today for a cognitive communication evaluation following a right PCA CVA in the setting of pre-existing cognitive decline and TBI as well as chronically unaided moderate bilateral sensorineural hearing loss.    See the above treatment note for details regarding today's session.     OBJECTIVE IMPAIRMENTS include attention, memory, awareness, executive functioning, and dysarthria. These impairments are limiting patient from managing medications, managing appointments, managing finances, household responsibilities, ADLs/IADLs, and effectively communicating at home and in community. Factors affecting potential to achieve goals and functional outcome are ability to learn/carryover information, co-morbidities, previous level of function, severity of impairments, and unaided hearing loss .Marland Kitchen Patient will benefit from skilled SLP services to address above impairments and improve overall function.  REHAB POTENTIAL: Fair pre-existing cognitive decline, chronically unaided hearing loss, severity of memory loss at present  PLAN: SLP FREQUENCY: 1-2x/week  SLP DURATION: 12 weeks  PLANNED INTERVENTIONS: Environmental controls, Cueing hierachy, Cognitive reorganization, Internal/external aids, Functional tasks, SLP instruction and  feedback, Compensatory strategies, and Patient/family education    Jillien Yakel B. Dreama Saa, M.S., CCC-SLP, Tree surgeon Certified Brain Injury Specialist [facility]  [facility] Rehabilitation Services Office [phone] Ascom [phone] Fax [phone]

## 2023-02-16 NOTE — Therapy (Addendum)
OUTPATIENT OCCUPATIONAL THERAPY NEURO TREATMENT NOTE  Patient Name: Clayton [name] MRN: [id number] DOB:Feb 21, 1935, 88 y.o., male Today's Date: 02/18/2023  PCP: Leonor Liv  REFERRING PROVIDER: Clydie Braun   END OF SESSION:  OT End of Session - 02/18/23 1505     Visit Number 6    Number of Visits 24    Date for OT Re-Evaluation 04/24/23    OT Start Time 1020    OT Stop Time 1100    OT Time Calculation (min) 40 min    Equipment Utilized During Treatment w/c    Activity Tolerance Patient tolerated treatment well    Behavior During Therapy [facility] for tasks assessed/performed            Past Medical History:  Diagnosis Date   Acute gastrointestinal hemorrhage    Acute hypoxemic respiratory failure (HCC) 05/25/2022   Acute thrombotic stroke (HCC) 12/14/2022   Basal cell carcinoma    Blood in stool 08/30/2018   Blood transfusion without reported diagnosis 3 (age 78)   After traumatic brain injury   Broken ribs    2 BROKEN ON RIBS   CKD (chronic kidney disease)    PT DENIES   Color blindness    DVT (deep venous thrombosis) (HCC)    RIGHT LEG   Dyslexia    Fracture dislocation of wrist    RIGHT   GI bleed 09/02/2018   Hiatal hernia with GERD    HTN (hypertension)    Hypokalemia 05/26/2022   Hyponatremia 05/25/2022   Symptomatic anemia 09/03/2018   TBI (traumatic brain injury) (HCC)    w/skull Fx and crani in 1960s   Past Surgical History:  Procedure Laterality Date   COLONOSCOPY  2014   cleared for 5 yrs   COLONOSCOPY N/A 09/01/2018   Procedure: COLONOSCOPY;  Surgeon: Pasty Spillers, MD;  Location: [facility] ENDOSCOPY;  Service: Endoscopy;  Laterality: N/A;   CRANIECTOMY FOR DEPRESSED SKULL FRACTURE  1960'S   ESOPHAGOGASTRODUODENOSCOPY N/A 09/01/2018   Procedure: ESOPHAGOGASTRODUODENOSCOPY (EGD);  Surgeon: Pasty Spillers, MD;  Location: [facility] ENDOSCOPY;  Service: Endoscopy;  Laterality: N/A;   HERNIA REPAIR     X2   IVC FILTER REMOVAL N/A  07/03/2017   Procedure: IVC FILTER REMOVAL;  Surgeon: Annice Needy, MD;  Location: [facility] INVASIVE CV LAB;  Service: Cardiovascular;  Laterality: N/A;   LUMBAR LAMINECTOMY/DECOMPRESSION MICRODISCECTOMY N/A 04/20/2020   Procedure: L2/3, L3/4 LAMINECTOMY;  Surgeon: Lucy Chris, MD;  Location: [facility] ORS;  Service: Neurosurgery;  Laterality: N/A;  2ND CASE   PERIPHERAL VASCULAR THROMBECTOMY Right 05/22/2017   Procedure: PERIPHERAL VASCULAR THROMBECTOMY;  Surgeon: Annice Needy, MD;  Location: [facility] INVASIVE CV LAB;  Service: Cardiovascular;  Laterality: Right;   SPINE SURGERY  December 2021   TONSILLECTOMY     Patient Active Problem List   Diagnosis Date Noted   Insomnia due to medical condition 12/29/2022   Elevated low density lipoprotein (LDL) cholesterol level 12/29/2022   Generalized anxiety disorder 12/29/2022   GERD (gastroesophageal reflux disease) 12/29/2022   ARF (acute renal failure) (HCC) 12/29/2022   Stroke (cerebrum) (HCC) 12/15/2022   Acute thrombotic stroke (HCC) 12/14/2022   Acute delirium 12/14/2022   Overweight (BMI 25.0-29.9) 12/13/2022   Weakness 12/12/2022   Cognitive impairment 12/12/2022   SOB (shortness of breath) 06/02/2022   Acute respiratory failure with hypoxia (HCC) 05/25/2022   Multifocal pneumonia 05/24/2022   Falls 05/24/2022   Acute left-sided low back pain with left-sided sciatica 02/25/2020   Lumbar stenosis with neurogenic  claudication 02/25/2020   Diverticulosis of colon with hemorrhage    Diverticulosis of small intestine without hemorrhage    HTN (hypertension) 08/30/2018   Deep vein thrombosis (DVT) of proximal vein of right lower extremity (HCC) 06/14/2017   ONSET DATE: 12/12/22  REFERRING DIAG: CVA  THERAPY DIAG:  Muscle weakness (generalized)  Other lack of coordination  Cerebrovascular accident (CVA) due to other mechanism [facility])  Rationale for Evaluation and Treatment: Rehabilitation  SUBJECTIVE:  SUBJECTIVE STATEMENT: Communication  received from SLP that spouse verbalized caregiver burnout during pt's SLP session this date.  Spouse and pt have been worked in to see Dr. Malvin Johns this afternoon to discuss pt's care. Pt. Had a follow-up MD appointment last week.  Pt accompanied by: significant other  PERTINENT HISTORY: [name] is a 88 y.o. male with medical history significant of cognitive impairment, history of TBI, hypertension presenting with weakness, confusion. Admitted to [facility] 12/12/22 for worsening weakness and cognitive impairment. Per MRI impression: "Scattered small acute to subacute infarcts in the right PCA distribution". Completed inpatient rehab stay from 12/15/22 to 12/28/22.   PRECAUTIONS: Fall  WEIGHT BEARING RESTRICTIONS: No  PAIN:  Are you having pain? No  FALLS: Has patient fallen in last 6 months? Yes. 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  OBJECTIVE:   HAND DOMINANCE: Right  ADLs: Overall ADLs: MIN A + cues for initiation  Transfers/ambulation related to ADLs: SUPERVISION with RW Eating: SETUP Grooming: SETUP + cues for initiation  UB Dressing: SETUP LB Dressing: MIN A Toileting: cueing Bathing: MIN A  Tub Shower transfers: MOD A  Equipment: Shower seat with back  IADLs: Shopping: spouse completes Light housekeeping: spouse completes Meal Prep: spouse completes Community mobility: spouse completes Medication management: spouse completes Landscape architect: spouse completes Handwriting: 100% legible  MOBILITY STATUS: Needs Assist: RW  POSTURE COMMENTS:  No Significant postural limitations  ACTIVITY TOLERANCE: Activity tolerance: 5 min standing  tolerance   FUNCTIONAL OUTCOME MEASURES: FOTO: 98  pt demonstrates poor insight into deficits   UPPER EXTREMITY ROM:  WFL   UPPER EXTREMITY MMT:   WFL  HAND FUNCTION: Grip strength: Right: 60 lbs; Left: 42 lbs, Lateral pinch: Right: 24 lbs, Left: 22 lbs, and 3 point pinch: Right: 20 lbs, Left: 16 lbs  COORDINATION: 9 Hole Peg test: Right: 30 sec; Left: 38 sec  SENSATION: WFL Not tested  EDEMA: none   COGNITION: Overall cognitive status: Impaired Completed clock draw with mildly impaired number spacing, unable to set correct times for minute/hand.  VISION: Subjective report:  Baseline vision: Wears glasses all the time Visual history:   VISION ASSESSMENT: To be further assessed in functional context  Patient has difficulty with following activities due to following visual impairments: L inattention Completed Albert's test with positive indicator for Unilateral Spatial Neglect - 10 lines on L lower/middle quadrant missed   OBSERVATIONS: Pt presents with spouse for OT evaluation.    TODAY'S TREATMENT:  DATE: 02/16/2023 Neuromuscular re-education: Facilitated L hand FMC/dexterity skills working to place small, short cylindrical beads onto a pegboard.  Pt worked to place beads first by hand (L), and then progressed to using tweezers (L) for placement and removal of beads.  Supplies placed to pt's L for promoting attention/awareness to L side.  Therapeutic Ex: Facilitated hand strengthening with use of hand gripper set at 23.4# for 1 round, then decreased to 17.9# for a second round to remove jumbo pegs from pegboard x2 trials using L hand.  Vc for technique and rest breaks to reduce dropped pegs.  Self Care: Education provided to spouse regarding dementia care strategies for easing daily/nightly routines.  Recommendations made for use of visual  schedule and maintaining a rigid daily/weekly schedule for consistent completion and participation in showering/grooming.  Reviewed strategies for alerting spouse to wandering at night time, including bed alarms, placing bell on door (spouse already has alarms on doors that lead outside).     PATIENT EDUCATION: Education details:left sided awareness,  lateral, and 3pt. Pinch position Person educated: Spouse Education method: Explanation Education comprehension: verbalized understanding  HOME EXERCISE PROGRAM: FMC HEP to be initiated next session  GOALS: Goals reviewed with patient? Yes  SHORT TERM GOALS: Target date: 03/13/23  Pt and caregiver will IND complete HEP for improved grip strength and dexterity.  Baseline: no HEP Goal status: INITIAL  LONG TERM GOALS: Target date: 04/24/23  Pt will increase L grip strength by 10# to open jars. Baseline: L grip 42# Goal status: INITIAL  2.  Pt will increase activity tolerance for standing dynamic tasks to 15 min to assist with IADLs Baseline: 5 min standing Goal status: INITIAL  3.  Pt will IND don pants with no cues from caregiver for sequencing.  Baseline: MIN A to don pants Goal status: INITIAL  4.  Pt/caregiver will verbalize plan to implement x3 visual compensation strategies for improved I/ADL performance.  Baseline: none Goal status: INITIAL  5.  Pt will improve non-dominant LUE dexterity by 10 seconds for return to leisure activity of wood carving. Baseline: 9 hole peg test 38 sec Goal status: INITIAL   ASSESSMENT:  CLINICAL IMPRESSION: Communication received from SLP that spouse verbalized caregiver burnout during pt's SLP session this date.  Spouse and pt have been worked in to see Dr. Malvin Johns this afternoon to discuss pt's care.  Focus this date on dementia care strategies with education reviewed with spouse, though spouse expresses extreme overwhelm and fatigue as she is not sleeping much at night time due to pt  exhibiting sundowning behaviors.  Further training/education needed.  Spouse was present for last part of session only to allow her a break from pt's care.  When pt was apart from spouse, focus was on L FMC/dexterity skills, L hand strengthening, and activities to increase L sided attention.  Pt verbalized fatigue with hand gripper, but was able to tolerate 1 trial on 23.4#, and 1 trial on 17.9#.  Pt demonstrated good manipulation skills with tweezers and by hand, to move small beads on/off a pegboard.  No cueing needed to attend to objects on L side today with activities at table top.  Pt continues to benefit from OT services to improve engagement of LUE during ADLs, increase tolerance for IADLs, to apply compensatory strategies to decrease caregiver burden, and to continue to provide education for ADL strategies for managing ADL decline related to pt's cognitive changes and newly exhibiting sundowning behavior.  PERFORMANCE DEFICITS: in functional skills including  ADLs, IADLs, and dexterity, cognitive skills including attention, emotional, memory, orientation, problem solving, and safety awareness, and psychosocial skills including coping strategies and environmental adaptation.   IMPAIRMENTS: are limiting patient from ADLs, IADLs, and leisure.   CO-MORBIDITIES: has co-morbidities such as TBI  that affects occupational performance. Patient will benefit from skilled OT to address above impairments and improve overall function.  MODIFICATION OR ASSISTANCE TO COMPLETE EVALUATION: Min-Moderate modification of tasks or assist with assess necessary to complete an evaluation.  OT OCCUPATIONAL PROFILE AND HISTORY: Detailed assessment: Review of records and additional review of physical, cognitive, psychosocial history related to current functional performance.  CLINICAL DECISION MAKING: Moderate - several treatment options, min-mod task modification necessary  REHAB POTENTIAL: Fair limited by  cognition/STM  EVALUATION COMPLEXITY: Moderate    PLAN:  OT FREQUENCY: 1-2x/week  OT DURATION: 12 weeks  PLANNED INTERVENTIONS: self care/ADL training, therapeutic exercise, therapeutic activity, patient/family education, cognitive remediation/compensation, visual/perceptual remediation/compensation, energy conservation, and DME and/or AE instructions  RECOMMENDED OTHER SERVICES: SLP, PT  CONSULTED AND AGREED WITH PLAN OF CARE: Patient and family member/caregiver  PLAN FOR NEXT SESSION: initiate HEP  Danelle Earthly, MS, OTR/L  02/18/2023, 3:07 PM

## 2023-02-21 ENCOUNTER — Ambulatory Visit: Payer: Medicare PPO | Admitting: Occupational Therapy

## 2023-02-21 ENCOUNTER — Ambulatory Visit: Payer: Medicare PPO

## 2023-02-21 DIAGNOSIS — R41841 Cognitive communication deficit: Secondary | ICD-10-CM | POA: Diagnosis not present

## 2023-02-21 DIAGNOSIS — M6281 Muscle weakness (generalized): Secondary | ICD-10-CM

## 2023-02-21 DIAGNOSIS — R262 Difficulty in walking, not elsewhere classified: Secondary | ICD-10-CM | POA: Diagnosis not present

## 2023-02-21 DIAGNOSIS — I6389 Other cerebral infarction: Secondary | ICD-10-CM | POA: Diagnosis not present

## 2023-02-21 DIAGNOSIS — R2681 Unsteadiness on feet: Secondary | ICD-10-CM | POA: Diagnosis not present

## 2023-02-21 DIAGNOSIS — R278 Other lack of coordination: Secondary | ICD-10-CM | POA: Diagnosis not present

## 2023-02-21 DIAGNOSIS — R296 Repeated falls: Secondary | ICD-10-CM | POA: Diagnosis not present

## 2023-02-21 NOTE — Therapy (Signed)
OUTPATIENT OCCUPATIONAL THERAPY NEURO TREATMENT NOTE  Patient Name: Clayton [name] MRN: [id number] DOB:May 11, 1934, 88 y.o., male Today's Date: 02/21/2023  PCP: Leonor Liv  REFERRING PROVIDER: Clydie Braun   END OF SESSION:  OT End of Session - 02/21/23 1744     Visit Number 7    Number of Visits 24    Date for OT Re-Evaluation 04/24/23    OT Start Time 0845    OT Stop Time 0930    OT Time Calculation (min) 45 min    Activity Tolerance Patient tolerated treatment well    Behavior During Therapy [facility] for tasks assessed/performed            Past Medical History:  Diagnosis Date   Acute gastrointestinal hemorrhage    Acute hypoxemic respiratory failure (HCC) 05/25/2022   Acute thrombotic stroke (HCC) 12/14/2022   Basal cell carcinoma    Blood in stool 08/30/2018   Blood transfusion without reported diagnosis 8 (age 3)   After traumatic brain injury   Broken ribs    2 BROKEN ON RIBS   CKD (chronic kidney disease)    PT DENIES   Color blindness    DVT (deep venous thrombosis) (HCC)    RIGHT LEG   Dyslexia    Fracture dislocation of wrist    RIGHT   GI bleed 09/02/2018   Hiatal hernia with GERD    HTN (hypertension)    Hypokalemia 05/26/2022   Hyponatremia 05/25/2022   Symptomatic anemia 09/03/2018   TBI (traumatic brain injury) (HCC)    w/skull Fx and crani in 1960s   Past Surgical History:  Procedure Laterality Date   COLONOSCOPY  2014   cleared for 5 yrs   COLONOSCOPY N/A 09/01/2018   Procedure: COLONOSCOPY;  Surgeon: Pasty Spillers, MD;  Location: [facility] ENDOSCOPY;  Service: Endoscopy;  Laterality: N/A;   CRANIECTOMY FOR DEPRESSED SKULL FRACTURE  1960'S   ESOPHAGOGASTRODUODENOSCOPY N/A 09/01/2018   Procedure: ESOPHAGOGASTRODUODENOSCOPY (EGD);  Surgeon: Pasty Spillers, MD;  Location: [facility] ENDOSCOPY;  Service: Endoscopy;  Laterality: N/A;   HERNIA REPAIR     X2   IVC FILTER REMOVAL N/A 07/03/2017   Procedure: IVC FILTER REMOVAL;   Surgeon: Annice Needy, MD;  Location: [facility] INVASIVE CV LAB;  Service: Cardiovascular;  Laterality: N/A;   LUMBAR LAMINECTOMY/DECOMPRESSION MICRODISCECTOMY N/A 04/20/2020   Procedure: L2/3, L3/4 LAMINECTOMY;  Surgeon: Lucy Chris, MD;  Location: [facility] ORS;  Service: Neurosurgery;  Laterality: N/A;  2ND CASE   PERIPHERAL VASCULAR THROMBECTOMY Right 05/22/2017   Procedure: PERIPHERAL VASCULAR THROMBECTOMY;  Surgeon: Annice Needy, MD;  Location: [facility] INVASIVE CV LAB;  Service: Cardiovascular;  Laterality: Right;   SPINE SURGERY  December 2021   TONSILLECTOMY     Patient Active Problem List   Diagnosis Date Noted   Insomnia due to medical condition 12/29/2022   Elevated low density lipoprotein (LDL) cholesterol level 12/29/2022   Generalized anxiety disorder 12/29/2022   GERD (gastroesophageal reflux disease) 12/29/2022   ARF (acute renal failure) (HCC) 12/29/2022   Stroke (cerebrum) (HCC) 12/15/2022   Acute thrombotic stroke (HCC) 12/14/2022   Acute delirium 12/14/2022   Overweight (BMI 25.0-29.9) 12/13/2022   Weakness 12/12/2022   Cognitive impairment 12/12/2022   SOB (shortness of breath) 06/02/2022   Acute respiratory failure with hypoxia (HCC) 05/25/2022   Multifocal pneumonia 05/24/2022   Falls 05/24/2022   Acute left-sided low back pain with left-sided sciatica 02/25/2020   Lumbar stenosis with neurogenic claudication 02/25/2020   Diverticulosis of colon with  hemorrhage    Diverticulosis of small intestine without hemorrhage    HTN (hypertension) 08/30/2018   Deep vein thrombosis (DVT) of proximal vein of right lower extremity (HCC) 06/14/2017   ONSET DATE: 12/12/22  REFERRING DIAG: CVA  THERAPY DIAG:  Muscle weakness (generalized)  Rationale for Evaluation and Treatment: Rehabilitation  SUBJECTIVE:  SUBJECTIVE STATEMENT: Pt.'s wife reports medication time changes have helped with the evening, and nighttime.  Pt. Had a follow-up MD appointment last week.  Pt accompanied  by: significant other  PERTINENT HISTORY: Clayton [name] is a 88 y.o. male with medical history significant of cognitive impairment, history of TBI, hypertension presenting with weakness, confusion. Admitted to [facility] 12/12/22 for worsening weakness and cognitive impairment. Per MRI impression: "Scattered small acute to subacute infarcts in the right PCA distribution". Completed inpatient rehab stay from 12/15/22 to 12/28/22.   PRECAUTIONS: Fall  WEIGHT BEARING RESTRICTIONS: No  PAIN:  Are you having pain? No  FALLS: Has patient fallen in last 6 months? Yes. 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  OBJECTIVE:   HAND DOMINANCE: Right  ADLs: Overall ADLs: MIN A + cues for initiation  Transfers/ambulation related to ADLs: SUPERVISION with RW Eating: SETUP Grooming: SETUP + cues for initiation  UB Dressing: SETUP LB Dressing: MIN A Toileting: cueing Bathing: MIN A  Tub Shower transfers: MOD A  Equipment: Shower seat with back  IADLs: Shopping: spouse completes Light housekeeping: spouse completes Meal Prep: spouse completes Community mobility: spouse completes Medication management: spouse completes Landscape architect: spouse completes Handwriting: 100% legible  MOBILITY STATUS: Needs Assist: RW  POSTURE COMMENTS:  No Significant postural limitations  ACTIVITY TOLERANCE: Activity tolerance: 5 min standing tolerance   FUNCTIONAL OUTCOME MEASURES: FOTO: 98  pt demonstrates poor insight into deficits   UPPER EXTREMITY ROM:  WFL   UPPER EXTREMITY MMT:   WFL  HAND FUNCTION: Grip strength: Right: 60 lbs; Left: 42 lbs, Lateral pinch: Right: 24  lbs, Left: 22 lbs, and 3 point pinch: Right: 20 lbs, Left: 16 lbs  COORDINATION: 9 Hole Peg test: Right: 30 sec; Left: 38 sec  SENSATION: WFL Not tested  EDEMA: none   COGNITION: Overall cognitive status: Impaired Completed clock draw with mildly impaired number spacing, unable to set correct times for minute/hand.  VISION: Subjective report:  Baseline vision: Wears glasses all the time Visual history:   VISION ASSESSMENT: To be further assessed in functional context  Patient has difficulty with following activities due to following visual impairments: L inattention Completed Albert's test with positive indicator for Unilateral Spatial Neglect - 10 lines on L lower/middle quadrant missed   OBSERVATIONS: Pt. presents with spouse for OT evaluation.    TODAY'S TREATMENT:  DATE: 02/21/2023  Neuromuscular re-education:  Pt. 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 Pt. then worked on using the tweezers to remove them from the putty x's 2 trials. Pt. worked on holding the theraputty with the left hand, while using the tweezers with the right hand. Pt. worked on Games developer to place toothpicks through small target holes in the cylindrical container. Worked on placing small cube beads over a toothpicks, followed by using tweezers to place rubber resistive backings over a small metal dowel.     PATIENT EDUCATION: Education details:left sided awareness, FMC Person educated: Spouse Education method: Explanation Education comprehension: verbalized understanding  HOME EXERCISE PROGRAM:  Continue to assess ongoing need  for HEPs.  GOALS: Goals reviewed with patient? Yes  SHORT TERM GOALS: Target date: 03/13/23  Pt and caregiver will IND complete HEP for improved grip strength and dexterity.  Baseline: no HEP Goal status: INITIAL  LONG TERM GOALS: Target date: 04/24/23  Pt will increase L grip strength by 10# to open jars. Baseline: L grip 42# Goal status: INITIAL  2.  Pt will increase activity tolerance for standing dynamic tasks to 15 min to assist with IADLs Baseline: 5 min standing Goal status: INITIAL  3.  Pt will IND don pants with no cues from caregiver for sequencing.  Baseline: MIN A to don pants Goal status: INITIAL  4.  Pt/caregiver will verbalize plan to implement x3 visual compensation strategies for improved I/ADL performance.  Baseline: none Goal status: INITIAL  5.  Pt will improve non-dominant LUE dexterity by 10 seconds for return to leisure activity of wood carving. Baseline: 9 hole peg test 38 sec Goal status: INITIAL   ASSESSMENT:  CLINICAL IMPRESSION:  Pt./caregiver report they are implementing routines at home. Pt. Initiated taking a shower this morning. Pt. Continues to work on modulating the amount of graded pressure on the tweezers required to push small items through the resistance. Pt. as able to accommodate his grasp on the tweezers consistently when manipulating the tweezers, and positioning each item  into the resistive putty. Pt continues to benefit from OT services to improve engagement of LUE during ADLs, increased tolerance for IADLs, and apply compensatory strategies to decreased caregiver burden.  PERFORMANCE DEFICITS: in functional skills including ADLs, IADLs, and dexterity, cognitive skills including attention, emotional, memory, orientation, problem solving, and safety awareness, and psychosocial skills including coping strategies and environmental adaptation.   IMPAIRMENTS: are limiting patient from ADLs, IADLs, and leisure.   CO-MORBIDITIES: has  co-morbidities such as TBI  that affects occupational performance. Patient will benefit from skilled OT to address above impairments and improve overall function.  MODIFICATION OR ASSISTANCE TO COMPLETE EVALUATION: Min-Moderate modification of tasks or assist with assess necessary to complete an evaluation.  OT OCCUPATIONAL PROFILE AND HISTORY: Detailed assessment: Review of records and additional review of physical, cognitive, psychosocial history related to current functional performance.  CLINICAL DECISION MAKING: Moderate - several treatment options, min-mod task modification necessary  REHAB POTENTIAL: Fair limited by cognition/STM  EVALUATION COMPLEXITY: Moderate    PLAN:  OT FREQUENCY: 1-2x/week  OT DURATION: 12 weeks  PLANNED INTERVENTIONS: self care/ADL training, therapeutic exercise, therapeutic activity, patient/family education, cognitive remediation/compensation, visual/perceptual remediation/compensation, energy conservation, and DME and/or AE instructions  RECOMMENDED OTHER SERVICES: SLP, PT  CONSULTED AND AGREED  WITH PLAN OF CARE: Patient and family member/caregiver  PLAN FOR NEXT SESSION: initiate HEP  Olegario Messier, MS, OTR/L  02/21/2023, 5:46 PM

## 2023-02-21 NOTE — Therapy (Addendum)
OUTPATIENT PHYSICAL THERAPY TREATMENT  Patient Name: Clayton [name] MRN: [id number] DOB:October 02, 1934, 88, Male Today's Date: 02/21/2023  END OF SESSION:.  PT End of Session - 02/21/23 1111     Visit Number 6    Number of Visits 16    Date for PT Re-Evaluation 04/01/23    Authorization Type [employer] Choice PPO    Authorization Time Period 01/30/23-04/01/23    Progress Note Due on Visit 10    PT Start Time [phone]    PT Stop Time 0930    PT Time Calculation (min) 41 min    Equipment Utilized During Treatment Gait belt    Activity Tolerance Patient tolerated treatment well;No increased pain    Behavior During Therapy Clayton [name] for tasks assessed/performed             *SmartPhrase corrected 04/19/23     Past Medical History:  Diagnosis Date   Acute gastrointestinal hemorrhage     Acute hypoxemic respiratory failure (HCC) 05/25/2022   Acute thrombotic stroke (HCC) 12/14/2022   Basal cell carcinoma     Blood in stool 08/30/2018   Blood transfusion without reported diagnosis 86 (age 60)    After traumatic brain injury   Broken ribs      2 BROKEN ON RIBS   CKD (chronic kidney disease)      PT DENIES   Color blindness     DVT (deep venous thrombosis) (HCC)      RIGHT LEG   Dyslexia     Fracture dislocation of wrist      RIGHT   GI bleed 09/02/2018   Hiatal hernia with GERD     HTN (hypertension)     Hypokalemia 05/26/2022   Hyponatremia 05/25/2022   Symptomatic anemia 09/03/2018   TBI (traumatic brain injury) (HCC)      w/skull Fx and crani in 1960s          *SmartPhrase corrected 04/19/23      Past Surgical History:  Procedure Laterality Date   COLONOSCOPY   2014    cleared for 5 yrs   COLONOSCOPY N/A 09/01/2018    Procedure: COLONOSCOPY;  Surgeon: Pasty Spillers, MD;  Location: [facility] ENDOSCOPY;  Service: Endoscopy;  Laterality: N/A;   CRANIECTOMY FOR DEPRESSED SKULL FRACTURE   1960'S   ESOPHAGOGASTRODUODENOSCOPY N/A 09/01/2018    Procedure:  ESOPHAGOGASTRODUODENOSCOPY (EGD);  Surgeon: Pasty Spillers, MD;  Location: [facility] ENDOSCOPY;  Service: Endoscopy;  Laterality: N/A;   HERNIA REPAIR        X2   IVC FILTER REMOVAL N/A 07/03/2017    Procedure: IVC FILTER REMOVAL;  Surgeon: Annice Needy, MD;  Location: [facility] INVASIVE CV LAB;  Service: Cardiovascular;  Laterality: N/A;   LUMBAR LAMINECTOMY/DECOMPRESSION MICRODISCECTOMY N/A 04/20/2020    Procedure: L2/3, L3/4 LAMINECTOMY;  Surgeon: Lucy Chris, MD;  Location: [facility] ORS;  Service: Neurosurgery;  Laterality: N/A;  2ND CASE   PERIPHERAL VASCULAR THROMBECTOMY Right 05/22/2017    Procedure: PERIPHERAL VASCULAR THROMBECTOMY;  Surgeon: Annice Needy, MD;  Location: [facility] INVASIVE CV LAB;  Service: Cardiovascular;  Laterality: Right;   SPINE SURGERY   December 2021   TONSILLECTOMY                 PCP: Tresa Garter MD  REFERRING PROVIDER: Wynn Banker MD  REFERRING DIAG: CVA  Rationale for Evaluation and Treatment: Rehabilitation  THERAPY DIAG:  No diagnosis found.  ONSET DATE: 12/12/22  SUBJECTIVE:  SUBJECTIVE STATEMENT: Pt spouse present for visit. Pt spouse reports one fall, describes "drifting to the floor" not a hard fall/no injuries. Pt reports no aches/pains. Per spouse, since pt's recent MD visit, he is now taking two seroquel and earlier in the day, has done this for four days. This has been really helpful with symptoms.    PERTINENT HISTORY:  Clayton [name] is a 88 y.o. male with medical history significant of cognitive impairment, history of TBI, hypertension presenting with weakness, confusion. Ad[facility] to [facility] 12/12/22 for worsening weakness and cognitive impairment. Per MRI impression: "Scattered small acute to subacute infarcts in the right PCA distribution". Prior Level of Function :  Independent/Modified Independent;Working/employed. Per wife: pt able to walk around without issue, Indepentdent without use of AD, however has a history of severla falls in past 2 years which she attributes to his bilat sensory neuropathy. He has been largely noncompliant with RW recommendations since Feb 2024. Pt DC to inpatient rehab in [place], then home with HHPT.   PAIN:  Are you having pain? No  PRECAUTIONS: Fall   WEIGHT BEARING RESTRICTIONS: No  FALLS:  Has patient fallen in last 6 months? Yes. Number of falls: unsure  LIVING ENVIRONMENT: Lives with: Wife Stairs: Yes: 8 entry stairs with 1 railing  Has following equipment at home: Dan Humphreys - 2 wheeled  OCCUPATION: Radiographer, therapeutic" makes crafts and art   PLOF: Independent with basic ADLs  PATIENT GOALS: Wife desires improved safety and stamina in the home, falls avoidance   NEXT MD VISIT: unknown  OBJECTIVE:   PATIENT SURVEYS:  FOTO: not appropriate due to memory/awareness deficits   COGNITION: Overall cognitive status: memory/awareness deficits     SENSATION: Per wife: sensory neuropathy BLE   FUNCTIONAL TESTS:  -5xSTS: 14.76sec hands free, no LOB  - : 923ft (several LOB when turning), no device, needs cues for navigation due to memory deficits  -TUG: 9.88sec, no device, no LOB   Balance Assessment: -SLS: ~3 sec bilat (large amplitude righting, full body)  -eyes closed firm: 30sec, mild to no sway -eyes closed foam: ~20 sec (sway uncontrolled), heavy sway increase -Berg Balance Test: pending     TODAY'S TREATMENT:                                                                                                                              DATE: 02/22/23   THEREX:   Seated Hip march 4# AW- 15x, 10x each LE - Rates easy  Seated Knee ext 4# AW with 3 sec hold x 12, additional set of 6 reps - rates hard/fatiguing Seated calf raises 4# x 2x15  Standing hip abd 4# AW x 2x10 each LE Mini squats 2x10    Nustep - interval training to promote LE strength/cardioresp endurance. PT sets up intervention, adjusts intensity throughout and monitors pt for response. Cuing for SPM/speed, pt maintains SPM in 40s Lvl 1 x 1 min 30 sec Lvl 2 x 1  min Lvl 3 x 1 min Lvl  2 x 1 min Lvl 1 x 30 sec   NMR: At support surface, gait belt donne Standing: EO WBOS, NBOS in bouts of 30 seconds EC NBOS 2x30 sec NBOS EO horizontal, vertical head turns 10x for each  Slow march with UUE support at RW 10x with min a to correct for LOB     PATIENT EDUCATION:  Education details: Pt educated throughout session about proper posture and technique with exercises. Improved exercise technique, movement at target joints, use of target muscles after min to mod verbal, visual, tactile cues.   Person educated: Patient, wife  Education method: We spoke about it Education comprehension: Pt verbalized understanding of message, however anticipate need to repeat and emphasize message due to empirical evidence that pt's only recall ~70% of education from their provider.   HOME EXERCISE PROGRAM: Access Code: [email or web address] URL: [email or web address] Date: 02/02/2023 Prepared by: Maureen Ralphs  Exercises - Sit to Stand with Arms Crossed  - 3 x weekly - 3 sets - 10 reps - Standing Hip Abduction with Counter Support  - 3 x weekly - 3 sets - 10 reps - Standing Heel Raises  - 3 x weekly - 3 sets - 10 reps - 3 hold  ASSESSMENT:  CLINICAL IMPRESSION: Pt continues to progress difficulty of strengthening therex. Pt also completed nustep endurance training to address limited activity tolerance/quick onset of fatigue. Pt generally required CGA-min a throughout for balance. Patient will benefit from continued skilled PT services to improve his overall functional LE strength and mobility to decrease fall risk and improve overall quality of life.   OBJECTIVE IMPAIRMENTS: Abnormal gait, cardiopulmonary status limiting  activity, decreased activity tolerance, decreased balance, decreased coordination, decreased mobility, difficulty walking, decreased strength, impaired flexibility, impaired sensation, impaired vision/perception, improper body mechanics, and postural dysfunction.  ACTIVITY LIMITATIONS: carrying, lifting, bending, sitting, standing, squatting, stairs, transfers, bed mobility, dressing, reach overhead, hygiene/grooming, locomotion level, and caring for others  PARTICIPATION LIMITATIONS: meal prep, cleaning, laundry, personal finances, driving, shopping, community activity, occupation, and yard work  PERSONAL FACTORS: Age, Fitness, Past/current experiences, Time since onset of injury/illness/exacerbation, and 3+ comorbidities REHAB POTENTIAL: Good  CLINICAL DECISION MAKING: Evolving/moderate complexity  EVALUATION COMPLEXITY: moderate  SHORT TERM GOALS: Target date: 03/01/23  Patient to report compliance and success with home based program as prescribed by this site in order to help with pt self-efficacy in outcomes and symptoms management.  Baseline: HEP to be issued at visit 2 Goal status: INITIAL    LONG TERM GOALS: Target date: 11/30  Pt to demonstrate improved power in legs, activity tolerance, and dynamic balance AEB improved 5xSTS and TUG.  Baseline: eval: 5xSTS hands free: 15+seconds; TUG c device in 9.86 sec Goal status: INITIAL  2.  Pt to demonstrate improved tolerance and distance in by >12% to indicate improved ability to perform tasks in community for IADL.   Baseline: eval: : 994ft, no device, several LOB with turning Goal status: INITIAL  3.  Pt to report more consistent use of 2-hand AD wen performing AMB outside of the house.  Baseline: intemrittent, inconsistent use of DME when AMB out of house.  Goal status: INITIAL  4. Pt will improve BERG by at least 3 points in order to demonstrate clinically significant improvement in balance.   Baseline: 02/02/2023= 42/56;  02/14/2023= 40/56  Goal Status: ONGOING PLAN:  PT FREQUENCY: 2x/week  PT DURATION: 2 months   PLANNED INTERVENTIONS: Therapeutic exercises, Therapeutic activity, Neuromuscular re-education,  Balance training, Gait training, Patient/Family education, Self Care, and Joint mobilization.  PLAN FOR NEXT SESSION:  dual tasking, AMB with RW , progress therex and balance training as appropriate, endurance   3:16 PM, 02/22/23 Temple Pacini PT, DPT  Physical Therapist - [facility]  Outpatient Physical Therapy- Main Campus [phone]   '

## 2023-02-23 ENCOUNTER — Ambulatory Visit: Payer: Medicare PPO | Admitting: Occupational Therapy

## 2023-02-23 ENCOUNTER — Ambulatory Visit: Payer: Medicare PPO | Admitting: Speech Pathology

## 2023-02-23 DIAGNOSIS — I6389 Other cerebral infarction: Secondary | ICD-10-CM

## 2023-02-23 DIAGNOSIS — R278 Other lack of coordination: Secondary | ICD-10-CM | POA: Diagnosis not present

## 2023-02-23 DIAGNOSIS — R41841 Cognitive communication deficit: Secondary | ICD-10-CM | POA: Diagnosis not present

## 2023-02-23 DIAGNOSIS — M6281 Muscle weakness (generalized): Secondary | ICD-10-CM

## 2023-02-23 DIAGNOSIS — R262 Difficulty in walking, not elsewhere classified: Secondary | ICD-10-CM | POA: Diagnosis not present

## 2023-02-23 DIAGNOSIS — R2681 Unsteadiness on feet: Secondary | ICD-10-CM | POA: Diagnosis not present

## 2023-02-23 DIAGNOSIS — R296 Repeated falls: Secondary | ICD-10-CM | POA: Diagnosis not present

## 2023-02-23 NOTE — Therapy (Signed)
OUTPATIENT SPEECH LANGUAGE PATHOLOGY  TREATMENT NOTE   Patient Name: Clayton [name] MRN: [id number] DOB:1935/02/08, 88 y.o., male Today's Date: 02/23/2023  PCP: Leonor Liv, MD REFERRING PROVIDER: Claudette Laws, MD   End of Session - 02/23/23 1314     Visit Number 5    Number of Visits 25    Date for SLP Re-Evaluation 04/24/23    Authorization Type [employer] Choice PPO    Authorization Time Period 01/30/2023 thru 03/31/2023    Authorization - Visit Number 5    Authorization - Number of Visits 18    Progress Note Due on Visit 10    SLP Start Time 1315    SLP Stop Time  1400    SLP Time Calculation (min) 45 min    Activity Tolerance Patient tolerated treatment well             No past medical history on file.  The histories are not reviewed yet. Please review them in the "History" navigator section and refresh this SmartLink. Patient Active Problem List   Diagnosis Date Noted   Insomnia due to medical condition 12/29/2022   Elevated low density lipoprotein (LDL) cholesterol level 12/29/2022   Generalized anxiety disorder 12/29/2022   GERD (gastroesophageal reflux disease) 12/29/2022   ARF (acute renal failure) (HCC) 12/29/2022   Stroke (cerebrum) (HCC) 12/15/2022   Acute thrombotic stroke (HCC) 12/14/2022   Acute delirium 12/14/2022   Overweight (BMI 25.0-29.9) 12/13/2022   Weakness 12/12/2022   Cognitive impairment 12/12/2022   SOB (shortness of breath) 06/02/2022   Acute respiratory failure with hypoxia (HCC) 05/25/2022   Multifocal pneumonia 05/24/2022   Falls 05/24/2022   Acute left-sided low back pain with left-sided sciatica 02/25/2020   Lumbar stenosis with neurogenic claudication 02/25/2020   Diverticulosis of colon with hemorrhage    Diverticulosis of small intestine without hemorrhage    HTN (hypertension) 08/30/2018   Deep vein thrombosis (DVT) of proximal vein of right lower extremity (HCC) 06/14/2017    ONSET DATE: pt's wife  reports gradual decline ~ 2 years ago, Cognitive Impairment initially dx on 12/12/2022; Right PCA CVA 12/12/2022; date of referral 01/16/2023   REFERRING DIAG: I63.531 (ICD-10-CM) - Acute right PCA stroke (HCC)  THERAPY DIAG:  Cognitive communication deficit  Cerebrovascular accident (CVA) due to other mechanism [facility])  Rationale for Evaluation and Treatment Rehabilitation  SUBJECTIVE:   PERTINENT HISTORY and DIAGNOSTIC FINDINGS: Pt is a 88 year old male with past medical history significant for TBI, hypertension, hyperlipidemia with multiple hospitalizations in January-February 2024 for pneumonia. While pt's wife reports gradual decrease in pt's vocal intensity and cognition, she reports singificant decline in memory and voice following this hospitalization (chart review reveals ambulatory referral made to neurology for concern regarding "mild cognitive impairment and dementia"). Pt seen by outpatient neurology on 06/28/2022 with MRI scheduled d/t concern with worsening memory loss. MRI 07/13/2022 - IMPRESSION: 1. No acute abnormality or reversible cause of memory loss. 2.  Cerebral atrophy. Pt with follow up appointment with Outpatient Neurology on 08/15/2022 with improved restlessness at night and mildly improved mood, ongiong unsteadiness while walking and unchanged memory deficits. Per chart, "discussed at length prognosis and safety concerns in setting of mild to moderate dementia." On 12/12/2022 pt [facility] to [facility] ED on 12/12/2022 with confusion. MRI confirmed (12/12/2022) FINDINGS: Small areas of restricted diffusion in the right occipital cortex, right hippocampus, and right thalamus. Other than the occipital cortex, diffusion restriction is fairly weak. Symmetric diffusion hyperintensity along the periphery of the  bilateral cerebellum is likely artifactual. Generalized cerebral volume loss. No acute hemorrhage, hydrocephalus, mass, or collection. Mild chronic small vessel ischemia in the  cerebral white matter. IMPRESSION: 1. Scattered small acute to subacute infarcts in the right PCA distribution 2. Generalized brain atrophy. [facility] until 12/28/2022. Pt with homehealth therapies in interm of Outpatient services. Follow up outpatient neurology on 01/17/2023 reveales worsening short and long term meory loss "memory evaluation today is 11/30."   This is in a largely unaided person with documented moderate bilateral sensory neural hearing loss (2015).   PAIN:  Are you having pain? No   FALLS: Has patient fallen in last 6 months?  See PT evaluation for details  LIVING ENVIRONMENT: Lives with: lives with their spouse Lives in: House/apartment  PLOF:  Level of assistance: Needed assistance with ADLs, Needed assistance with IADLS Employment: Retired   PATIENT GOALS   caregiver expresses desire to be able to facilitate recovery better  SUBJECTIVE STATEMENT: P and his wife arrive to session pleasant, eager Pt accompanied by: significant other  OBJECTIVE:     TODAY'S TREATMENT:  Skilled treatment session focused on pt's cognitive communication goals. SLP facilitated session by providing the following interventions:  Pt unable to recall appt with neurologist last week but his wife provides good information and voices improved sleep with decreased restlessness as a result of MD recommendations for medication administration.   SLP provided pt and his wife with list of dementia centered support groups as well as  a book that others have found helpful for caregiver education. Skilled education provided on creating a routine/schedule and encouraging pt participation in all tasks such as cleaning up table after meals. Goal is participation along side caregiver. Pt's wife had several questions and all were answered to her satisfaction during today's session. Continue to encourage them to write down questions regarding disease process and ADLs.   PATIENT  EDUCATION: Education details: need to wear hearing aids, stroke recovery, dementia and cognition, ST POC Person educated: Patient and Spouse Education method: Explanation Education comprehension: needs further education  HOME EXERCISE PROGRAM:        Wear hearing aids    GOALS:  Goals reviewed with patient? Yes  SHORT TERM GOALS: Target date: 10 sessions  Pt will complete formal cognitive communication evaluation.  Baseline: Goal status: INITIAL   2.  Pt will refer to external memory aids for accurate recall of orientation information in 6 out of 7 opportunities.  Baseline: oriented to self and town Goal status: INITIAL  3.  With Min A, pt will complete Expiratory Muscle Training (EMT) across 30 trials i at 50-70% of MEP improve coordination of respiration and phonation and maximize breath support for speech. Baseline:  Goal status: INITIAL  4.  With Moderate assistance, pt will utilize speech intelligibility strategies to achieve > 80% speech intelligibility at the sentence level.  Baseline:  Goal status: INITIAL   LONG TERM GOALS: Target date: 04/24/2023  With Min A, patient will use external aids to manage appointments, chores, shopping, and log vital signs.    Baseline:  Goal status: INITIAL  2.  Pt will report improved cognitive communication via PROM by 5 points at last ST session   Baseline:  Goal status: INITIAL  3.  Pt will complete HEP with moderate assistance across 5 out of 7 opportunities.  Baseline:  Goal status: INITIAL   ASSESSMENT:  CLINICAL IMPRESSION:  Patient is a 88 y.o. male who was seen today  for a cognitive communication evaluation following a right PCA CVA in the setting of pre-existing cognitive decline and TBI as well as chronically unaided moderate bilateral sensorineural hearing loss. Pt and his wife continue to report some decline in overall abilities such as pt attempting to eat putty provided by OT.    See the above treatment note  for details regarding today's session.     OBJECTIVE IMPAIRMENTS include attention, memory, awareness, executive functioning, and dysarthria. These impairments are limiting patient from managing medications, managing appointments, managing finances, household responsibilities, ADLs/IADLs, and effectively communicating at home and in community. Factors affecting potential to achieve goals and functional outcome are ability to learn/carryover information, co-morbidities, previous level of function, severity of impairments, and unaided hearing loss .Marland Kitchen Patient will benefit from skilled SLP services to address above impairments and improve overall function.  REHAB POTENTIAL: Fair pre-existing cognitive decline, chronically unaided hearing loss, severity of memory loss at present  PLAN: SLP FREQUENCY: 1-2x/week  SLP DURATION: 12 weeks  PLANNED INTERVENTIONS: Environmental controls, Cueing hierachy, Cognitive reorganization, Internal/external aids, Functional tasks, SLP instruction and feedback, Compensatory strategies, and Patient/family education    Bradan Congrove B. Dreama Saa, M.S., CCC-SLP, Tree surgeon Certified Brain Injury Specialist [facility]  [facility] Rehabilitation Services Office [phone] Ascom [phone] Fax [phone]

## 2023-02-23 NOTE — Therapy (Signed)
OUTPATIENT OCCUPATIONAL THERAPY NEURO TREATMENT NOTE  Patient Name: Clayton [name] MRN: [id number] DOB:31-Dec-1934, 88 y.o., male Today's Date: 02/23/2023  PCP: Leonor Liv  REFERRING PROVIDER: Clydie Braun   END OF SESSION:  OT End of Session - 02/23/23 1522     Visit Number 8    Number of Visits 24    Date for OT Re-Evaluation 04/24/23    OT Start Time 1400    OT Stop Time 1445    OT Time Calculation (min) 45 min    Activity Tolerance Patient tolerated treatment well    Behavior During Therapy [facility] for tasks assessed/performed            Past Medical History:  Diagnosis Date   Acute gastrointestinal hemorrhage    Acute hypoxemic respiratory failure (HCC) 05/25/2022   Acute thrombotic stroke (HCC) 12/14/2022   Basal cell carcinoma    Blood in stool 08/30/2018   Blood transfusion without reported diagnosis 73 (age 41)   After traumatic brain injury   Broken ribs    2 BROKEN ON RIBS   CKD (chronic kidney disease)    PT DENIES   Color blindness    DVT (deep venous thrombosis) (HCC)    RIGHT LEG   Dyslexia    Fracture dislocation of wrist    RIGHT   GI bleed 09/02/2018   Hiatal hernia with GERD    HTN (hypertension)    Hypokalemia 05/26/2022   Hyponatremia 05/25/2022   Symptomatic anemia 09/03/2018   TBI (traumatic brain injury) (HCC)    w/skull Fx and crani in 1960s   Past Surgical History:  Procedure Laterality Date   COLONOSCOPY  2014   cleared for 5 yrs   COLONOSCOPY N/A 09/01/2018   Procedure: COLONOSCOPY;  Surgeon: Pasty Spillers, MD;  Location: [facility] ENDOSCOPY;  Service: Endoscopy;  Laterality: N/A;   CRANIECTOMY FOR DEPRESSED SKULL FRACTURE  1960'S   ESOPHAGOGASTRODUODENOSCOPY N/A 09/01/2018   Procedure: ESOPHAGOGASTRODUODENOSCOPY (EGD);  Surgeon: Pasty Spillers, MD;  Location: [facility] ENDOSCOPY;  Service: Endoscopy;  Laterality: N/A;   HERNIA REPAIR     X2   IVC FILTER REMOVAL N/A 07/03/2017   Procedure: IVC FILTER REMOVAL;   Surgeon: Annice Needy, MD;  Location: [facility] INVASIVE CV LAB;  Service: Cardiovascular;  Laterality: N/A;   LUMBAR LAMINECTOMY/DECOMPRESSION MICRODISCECTOMY N/A 04/20/2020   Procedure: L2/3, L3/4 LAMINECTOMY;  Surgeon: Lucy Chris, MD;  Location: [facility] ORS;  Service: Neurosurgery;  Laterality: N/A;  2ND CASE   PERIPHERAL VASCULAR THROMBECTOMY Right 05/22/2017   Procedure: PERIPHERAL VASCULAR THROMBECTOMY;  Surgeon: Annice Needy, MD;  Location: [facility] INVASIVE CV LAB;  Service: Cardiovascular;  Laterality: Right;   SPINE SURGERY  December 2021   TONSILLECTOMY     Patient Active Problem List   Diagnosis Date Noted   Insomnia due to medical condition 12/29/2022   Elevated low density lipoprotein (LDL) cholesterol level 12/29/2022   Generalized anxiety disorder 12/29/2022   GERD (gastroesophageal reflux disease) 12/29/2022   ARF (acute renal failure) (HCC) 12/29/2022   Stroke (cerebrum) (HCC) 12/15/2022   Acute thrombotic stroke (HCC) 12/14/2022   Acute delirium 12/14/2022   Overweight (BMI 25.0-29.9) 12/13/2022   Weakness 12/12/2022   Cognitive impairment 12/12/2022   SOB (shortness of breath) 06/02/2022   Acute respiratory failure with hypoxia (HCC) 05/25/2022   Multifocal pneumonia 05/24/2022   Falls 05/24/2022   Acute left-sided low back pain with left-sided sciatica 02/25/2020   Lumbar stenosis with neurogenic claudication 02/25/2020   Diverticulosis of colon with  hemorrhage    Diverticulosis of small intestine without hemorrhage    HTN (hypertension) 08/30/2018   Deep vein thrombosis (DVT) of proximal vein of right lower extremity (HCC) 06/14/2017   ONSET DATE: 12/12/22  REFERRING DIAG: CVA  THERAPY DIAG:  Muscle weakness (generalized)  Rationale for Evaluation and Treatment: Rehabilitation  SUBJECTIVE:  SUBJECTIVE STATEMENT: Pt.'s wife continues to report that medication time changes have helped with the evening, and nighttime.  Pt. Had a follow-up MD appointment last week.   Pt accompanied by: significant other  PERTINENT HISTORY: Clayton [name] is a 88 y.o. male with medical history significant of cognitive impairment, history of TBI, hypertension presenting with weakness, confusion. Admitted to [facility] 12/12/22 for worsening weakness and cognitive impairment. Per MRI impression: "Scattered small acute to subacute infarcts in the right PCA distribution". Completed inpatient rehab stay from 12/15/22 to 12/28/22.   PRECAUTIONS: Fall  WEIGHT BEARING RESTRICTIONS: No  PAIN:  Are you having pain? No  FALLS: Has patient fallen in last 6 months? Yes. 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  OBJECTIVE:   HAND DOMINANCE: Right  ADLs: Overall ADLs: MIN A + cues for initiation  Transfers/ambulation related to ADLs: SUPERVISION with RW Eating: SETUP Grooming: SETUP + cues for initiation  UB Dressing: SETUP LB Dressing: MIN A Toileting: cueing Bathing: MIN A  Tub Shower transfers: MOD A  Equipment: Shower seat with back  IADLs: Shopping: spouse completes Light housekeeping: spouse completes Meal Prep: spouse completes Community mobility: spouse completes Medication management: spouse completes Landscape architect: spouse completes Handwriting: 100% legible  MOBILITY STATUS: Needs Assist: RW  POSTURE COMMENTS:  No Significant postural limitations  ACTIVITY TOLERANCE: Activity tolerance: 5 min standing tolerance   FUNCTIONAL OUTCOME MEASURES: FOTO: 98  pt demonstrates poor insight into deficits   UPPER EXTREMITY ROM:  WFL   UPPER EXTREMITY MMT:   WFL  HAND FUNCTION: Grip strength: Right: 60 lbs; Left: 42 lbs, Lateral  pinch: Right: 24 lbs, Left: 22 lbs, and 3 point pinch: Right: 20 lbs, Left: 16 lbs  COORDINATION: 9 Hole Peg test: Right: 30 sec; Left: 38 sec  SENSATION: WFL Not tested  EDEMA: none   COGNITION: Overall cognitive status: Impaired Completed clock draw with mildly impaired number spacing, unable to set correct times for minute/hand.  VISION: Subjective report:  Baseline vision: Wears glasses all the time Visual history:   VISION ASSESSMENT: To be further assessed in functional context  Patient has difficulty with following activities due to following visual impairments: L inattention Completed Albert's test with positive indicator for Unilateral Spatial Neglect - 10 lines on L lower/middle quadrant missed   OBSERVATIONS: Pt. presents with spouse for OT evaluation.    TODAY'S TREATMENT:  DATE: 02/23/2023  Neuromuscular re-education:  Pt. 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 Pt. then worked on using the tweezers to remove them from the putty. Pt. worked on holding the theraputty with the left hand, while using the tweezers with the right hand. Pt. worked on using precision tweezers to remove tiny cubes from a long thin container without touching the edges. Pt. worked on left hand [facility] skills removing coins from green resistive putty, and placing them through a resistive container.     PATIENT EDUCATION: Education details:left sided awareness, [facility] Person educated: Spouse Education method: Explanation Education comprehension: verbalized understanding  HOME EXERCISE PROGRAM:  Continue to assess ongoing  need for HEPs.  GOALS: Goals reviewed with patient? Yes  SHORT TERM GOALS: Target date: 03/13/23  Pt and caregiver will IND complete HEP for improved grip strength and dexterity.  Baseline: no HEP Goal status: INITIAL  LONG TERM GOALS: Target date: 04/24/23  Pt will increase L grip strength by 10# to open jars. Baseline: L grip 42# Goal status: INITIAL  2.  Pt will increase activity tolerance for standing dynamic tasks to 15 min to assist with IADLs Baseline: 5 min standing Goal status: INITIAL  3.  Pt will IND don pants with no cues from caregiver for sequencing.  Baseline: MIN A to don pants Goal status: INITIAL  4.  Pt/caregiver will verbalize plan to implement x3 visual compensation strategies for improved I/ADL performance.  Baseline: none Goal status: INITIAL  5.  Pt will improve non-dominant LUE dexterity by 10 seconds for return to leisure activity of wood carving. Baseline: 9 hole peg test 38 sec Goal status: INITIAL   ASSESSMENT:  CLINICAL IMPRESSION:  Pt./caregiver report that they are looking into placed to set up a work station at home, as his daughter will be bringing projects from [employer]  for him to work on. Pt. continues to work on modulating the amount of graded pressure on the tweezers required to push small items through the resistance. Pt. Continues to work on accommodating his grasp on the tweezers consistently when manipulating the tweezers, and positioning each of the items within the resistive putty. Pt.continues to benefit from OT services to improve engagement of LUE during ADLs, increased tolerance for IADLs, and apply compensatory strategies to decreased caregiver burden.  PERFORMANCE DEFICITS: in functional skills including ADLs, IADLs, and dexterity, cognitive skills including attention, emotional, memory, orientation, problem solving, and safety awareness, and psychosocial skills including coping strategies and environmental adaptation.    IMPAIRMENTS: are limiting patient from ADLs, IADLs, and leisure.   CO-MORBIDITIES: has co-morbidities such as TBI  that affects occupational performance. Patient will benefit from skilled OT to address above impairments and improve overall function.  MODIFICATION OR ASSISTANCE TO COMPLETE EVALUATION: Min-Moderate modification of tasks or assist with assess necessary to complete an evaluation.  OT OCCUPATIONAL PROFILE AND HISTORY: Detailed assessment: Review of records and additional review of physical, cognitive, psychosocial history related to current functional performance.  CLINICAL DECISION MAKING: Moderate - several treatment options, min-mod task modification necessary  REHAB POTENTIAL: Fair limited by cognition/STM  EVALUATION COMPLEXITY: Moderate    PLAN:  OT FREQUENCY: 1-2x/week  OT DURATION: 12 weeks  PLANNED INTERVENTIONS: self care/ADL training, therapeutic exercise, therapeutic activity, patient/family education, cognitive remediation/compensation, visual/perceptual remediation/compensation, energy  conservation, and DME and/or AE instructions  RECOMMENDED OTHER SERVICES: SLP, PT  CONSULTED AND AGREED WITH PLAN OF CARE: Patient and family member/caregiver  PLAN FOR NEXT SESSION: initiate HEP  Olegario Messier, MS, OTR/L  02/23/2023, 3:24 PM

## 2023-02-28 ENCOUNTER — Ambulatory Visit: Payer: Medicare PPO | Admitting: Occupational Therapy

## 2023-02-28 ENCOUNTER — Ambulatory Visit: Payer: Medicare PPO | Admitting: Speech Pathology

## 2023-02-28 ENCOUNTER — Ambulatory Visit: Payer: Medicare PPO

## 2023-02-28 DIAGNOSIS — R2681 Unsteadiness on feet: Secondary | ICD-10-CM | POA: Diagnosis not present

## 2023-02-28 DIAGNOSIS — R262 Difficulty in walking, not elsewhere classified: Secondary | ICD-10-CM | POA: Diagnosis not present

## 2023-02-28 DIAGNOSIS — R278 Other lack of coordination: Secondary | ICD-10-CM

## 2023-02-28 DIAGNOSIS — R296 Repeated falls: Secondary | ICD-10-CM

## 2023-02-28 DIAGNOSIS — I6389 Other cerebral infarction: Secondary | ICD-10-CM

## 2023-02-28 DIAGNOSIS — R41841 Cognitive communication deficit: Secondary | ICD-10-CM | POA: Diagnosis not present

## 2023-02-28 DIAGNOSIS — M6281 Muscle weakness (generalized): Secondary | ICD-10-CM

## 2023-02-28 NOTE — Therapy (Signed)
OUTPATIENT SPEECH LANGUAGE PATHOLOGY  TREATMENT NOTE   Patient Name: Clayton [name] MRN: [id number] DOB:[birth year], 88 y.o., male Today's Date: 02/28/2023  PCP: Leonor Liv, MD REFERRING PROVIDER: Claudette Laws, MD   End of Session - 02/28/23 0932     Visit Number 6    Number of Visits 25    Date for SLP Re-Evaluation 04/24/23    Authorization Type [employer] PPO    Authorization Time Period 01/30/2023 thru 03/31/2023    Authorization - Visit Number 6    Authorization - Number of Visits 18    Progress Note Due on Visit 10    SLP Start Time 0930    SLP Stop Time  1015    SLP Time Calculation (min) 45 min    Activity Tolerance Patient tolerated treatment well             No past medical history on file.  The histories are not reviewed yet. Please review them in the "History" navigator section and refresh this SmartLink. Patient Active Problem List   Diagnosis Date Noted   Insomnia due to medical condition 12/29/2022   Elevated low density lipoprotein (LDL) cholesterol level 12/29/2022   Generalized anxiety disorder 12/29/2022   GERD (gastroesophageal reflux disease) 12/29/2022   ARF (acute renal failure) (HCC) 12/29/2022   Stroke (cerebrum) (HCC) 12/15/2022   Acute thrombotic stroke (HCC) 12/14/2022   Acute delirium 12/14/2022   Overweight (BMI 25.0-29.9) 12/13/2022   Weakness 12/12/2022   Cognitive impairment 12/12/2022   SOB (shortness of breath) 06/02/2022   Acute respiratory failure with hypoxia (HCC) 05/25/2022   Multifocal pneumonia 05/24/2022   Falls 05/24/2022   Acute left-sided low back pain with left-sided sciatica 02/25/2020   Lumbar stenosis with neurogenic claudication 02/25/2020   Diverticulosis of colon with hemorrhage    Diverticulosis of small intestine without hemorrhage    HTN (hypertension) 08/30/2018   Deep vein thrombosis (DVT) of proximal vein of right lower extremity (HCC) 06/14/2017    ONSET DATE: pt's wife  reports gradual decline ~ 2 years ago, Cognitive Impairment initially dx on 12/12/2022; Right PCA CVA 12/12/2022; date of referral 01/16/2023   REFERRING DIAG: I63.531 (ICD-10-CM) - Acute right PCA stroke (HCC)  THERAPY DIAG:  No diagnosis found.  Rationale for Evaluation and Treatment Rehabilitation  SUBJECTIVE:   PERTINENT HISTORY and DIAGNOSTIC FINDINGS: Pt is a 88 year old male with past medical history significant for TBI, hypertension, hyperlipidemia with multiple hospitalizations in January-February 2024 for pneumonia. While pt's wife reports gradual decrease in pt's vocal intensity and cognition, she reports singificant decline in memory and voice following this hospitalization (chart review reveals ambulatory referral made to neurology for concern regarding "mild cognitive impairment and dementia"). Pt seen by outpatient neurology on 06/28/2022 with MRI scheduled d/t concern with worsening memory loss. MRI 07/13/2022 - IMPRESSION: 1. No acute abnormality or reversible cause of memory loss. 2.  Cerebral atrophy. Pt with follow up appointment with Outpatient Neurology on 08/15/2022 with improved restlessness at night and mildly improved mood, ongiong unsteadiness while walking and unchanged memory deficits. Per chart, "discussed at length prognosis and safety concerns in setting of mild to moderate dementia." On 12/12/2022 pt [facility] ED on 12/12/2022 with confusion. MRI confirmed (12/12/2022) FINDINGS: Small areas of restricted diffusion in the right occipital cortex, right hippocampus, and right thalamus. Other than the occipital cortex, diffusion restriction is fairly weak. Symmetric diffusion hyperintensity along the periphery of the bilateral cerebellum is likely artifactual. Generalized cerebral volume loss.  No acute hemorrhage, hydrocephalus, mass, or collection. Mild chronic small vessel ischemia in the cerebral white matter. IMPRESSION: 1. Scattered small acute to subacute  infarcts in the right PCA distribution 2. Generalized brain atrophy. [facility] until 12/28/2022. Pt with homehealth therapies in interm of Outpatient services. Follow up outpatient neurology on 01/17/2023 reveales worsening short and long term meory loss "memory evaluation today is 11/30."   This is in a largely unaided person with documented moderate bilateral sensory neural hearing loss (2015).   PAIN:  Are you having pain? No   FALLS: Has patient fallen in last 6 months?  See PT evaluation for details  LIVING ENVIRONMENT: Lives with: lives with their spouse Lives in: House/apartment  PLOF:  Level of assistance: Needed assistance with ADLs, Needed assistance with IADLS Employment: Retired   PATIENT GOALS   caregiver expresses desire to be able to facilitate recovery better  SUBJECTIVE STATEMENT: P and his wife arrive to session pleasant, eager Pt accompanied by: significant other  OBJECTIVE:     TODAY'S TREATMENT:  Skilled treatment session focused on pt's cognitive communication goals. SLP facilitated session by providing the following interventions:  Pt's wife reports that recent medication change continues to help with sun downing - she also reports that all of pt's abilities are markedly declined after ~ 4pm -   After 4pm - pt doesn't know his house, feeling as if he needs to leave to get to their house, need to complete homework for school the next day - [name] with questions about honestly replying and reorienting pt - would continue to recommend gentle re-orientation to promotion safety - she also reports that pt had a fall on the way home last Thursday (PT aware and targeted)    Pt's wife had questions about list of resources (dementia support groups), questions answered to the best of this writer's ability    SLP also provided skilled information regarding creation of routine/schedule to include some cognitive stimulating activities with  specific examples provided in session (sorting tasks, folding clothes general meals prep) - she and he provide that pt looks to read and enjoys being read to her - continue to support this activity    SLP also provided demonstration of reminiscence. Pt with increased participation and recall. Explanation provided on ways to cue for long term memory vs short term memory.  PATIENT EDUCATION: Education details: See above Person educated: Patient and Spouse Education method: Explanation Education comprehension: needs further education  HOME EXERCISE PROGRAM:        Wear hearing aids Read  Establish a schedule  Complete ADLs/iADLs together  Look thru memory books/picture books    GOALS:  Goals reviewed with patient? Yes  SHORT TERM GOALS: Target date: 10 sessions  Pt will complete formal cognitive communication evaluation.  Baseline: Goal status: INITIAL   2.  Pt will refer to external memory aids for accurate recall of orientation information in 6 out of 7 opportunities.  Baseline: oriented to self and town Goal status: INITIAL  3.  With Min A, pt will complete Expiratory Muscle Training (EMT) across 30 trials i at 50-70% of MEP improve coordination of respiration and phonation and maximize breath support for speech. Baseline:  Goal status: INITIAL  4.  With Moderate assistance, pt will utilize speech intelligibility strategies to achieve > 80% speech intelligibility at the sentence level.  Baseline:  Goal status: INITIAL   LONG TERM GOALS: Target date: 04/24/2023  With Min A, patient will  use external aids to manage appointments, chores, shopping, and log vital signs.    Baseline:  Goal status: INITIAL  2.  Pt will report improved cognitive communication via PROM by 5 points at last ST session   Baseline:  Goal status: INITIAL  3.  Pt will complete HEP with moderate assistance across 5 out of 7 opportunities.  Baseline:  Goal status:  INITIAL   ASSESSMENT:  CLINICAL IMPRESSION:  Patient is a 88 y.o. male who was seen today for a cognitive communication evaluation following a right PCA CVA in the setting of pre-existing cognitive decline and TBI as well as chronically unaided moderate bilateral sensorineural hearing loss.    See the above treatment note for details regarding today's session.     OBJECTIVE IMPAIRMENTS include attention, memory, awareness, executive functioning, and dysarthria. These impairments are limiting patient from managing medications, managing appointments, managing finances, household responsibilities, ADLs/IADLs, and effectively communicating at home and in community. Factors affecting potential to achieve goals and functional outcome are ability to learn/carryover information, co-morbidities, previous level of function, severity of impairments, and unaided hearing loss .Marland Kitchen Patient will benefit from skilled SLP services to address above impairments and improve overall function.  REHAB POTENTIAL: Fair pre-existing cognitive decline, chronically unaided hearing loss, severity of memory loss at present  PLAN: SLP FREQUENCY: 1-2x/week  SLP DURATION: 12 weeks  PLANNED INTERVENTIONS: Environmental controls, Cueing hierachy, Cognitive reorganization, Internal/external aids, Functional tasks, SLP instruction and feedback, Compensatory strategies, and Patient/family education    Alyscia Carmon B. Dreama Saa, M.S., CCC-SLP, Tree surgeon Certified Brain Injury Specialist [facility]  [facility] Office [phone] Ascom [phone] Fax [phone]

## 2023-02-28 NOTE — Therapy (Addendum)
OUTPATIENT PHYSICAL THERAPY TREATMENT  Patient Name: Clayton [name]. [place]i MRN: [id number] DOB:08-Mar-1935, 43, Male Today's Date: 02/21/2023  END OF SESSION:.  PT End of Session - 02/28/23 0922     Visit Number 7    Number of Visits 16    Date for PT Re-Evaluation 04/01/23    Authorization Type [employer] PPO    Authorization Time Period 01/30/23-04/01/23    Progress Note Due on Visit 10    PT Start Time 0804    PT Stop Time 0844    PT Time Calculation (min) 40 min    Equipment Utilized During Treatment Gait belt    Activity Tolerance Patient tolerated treatment well;No increased pain    Behavior During Therapy [facility] for tasks assessed/performed            Past Medical History:  Diagnosis Date   Acute gastrointestinal hemorrhage     Acute hypoxemic respiratory failure (HCC) 05/25/2022   Acute thrombotic stroke (HCC) 12/14/2022   Basal cell carcinoma     Blood in stool 08/30/2018   Blood transfusion without reported diagnosis 44 (age 89)    After traumatic brain injury   Broken ribs      2 BROKEN ON RIBS   CKD (chronic kidney disease)      PT DENIES   Color blindness     DVT (deep venous thrombosis) (HCC)      RIGHT LEG   Dyslexia     Fracture dislocation of wrist      RIGHT   GI bleed 09/02/2018   Hiatal hernia with GERD     HTN (hypertension)     Hypokalemia 05/26/2022   Hyponatremia 05/25/2022   Symptomatic anemia 09/03/2018   TBI (traumatic brain injury) (HCC)      w/skull Fx and crani in 1960s                  Past Surgical History:  Procedure Laterality Date   COLONOSCOPY   2014    cleared for 5 yrs   COLONOSCOPY N/A 09/01/2018    Procedure: COLONOSCOPY;  Surgeon: Pasty Spillers, MD;  Location: [facility] ENDOSCOPY;  Service: Endoscopy;  Laterality: N/A;   CRANIECTOMY FOR DEPRESSED SKULL FRACTURE   1960'S   ESOPHAGOGASTRODUODENOSCOPY N/A 09/01/2018    Procedure: ESOPHAGOGASTRODUODENOSCOPY (EGD);  Surgeon: Pasty Spillers, MD;   Location: [facility] ENDOSCOPY;  Service: Endoscopy;  Laterality: N/A;   HERNIA REPAIR        X2   IVC FILTER REMOVAL N/A 07/03/2017    Procedure: IVC FILTER REMOVAL;  Surgeon: Annice Needy, MD;  Location: [facility] INVASIVE CV LAB;  Service: Cardiovascular;  Laterality: N/A;   LUMBAR LAMINECTOMY/DECOMPRESSION MICRODISCECTOMY N/A 04/20/2020    Procedure: L2/3, L3/4 LAMINECTOMY;  Surgeon: Lucy Chris, MD;  Location: [facility] ORS;  Service: Neurosurgery;  Laterality: N/A;  2ND CASE   PERIPHERAL VASCULAR THROMBECTOMY Right 05/22/2017    Procedure: PERIPHERAL VASCULAR THROMBECTOMY;  Surgeon: Annice Needy, MD;  Location: [facility] INVASIVE CV LAB;  Service: Cardiovascular;  Laterality: Right;   SPINE SURGERY   December 2021   TONSILLECTOMY               PCP: Tresa Garter MD  REFERRING PROVIDER: Wynn Banker MD  REFERRING DIAG: CVA  Rationale for Evaluation and Treatment: Rehabilitation  THERAPY DIAG:  No diagnosis found.  ONSET DATE: 12/12/22  SUBJECTIVE:  SUBJECTIVE STATEMENT: Pt wife reports 1 fall since last visit- outside from car to steps- no injury but states hard fall.    PERTINENT HISTORY:  Clayton [name] is a 88 y.o. male with medical history significant of cognitive impairment, history of TBI, hypertension presenting with weakness, confusion. Admitted to [facility] 12/12/22 for worsening weakness and cognitive impairment. Per MRI impression: "Scattered small acute to subacute infarcts in the right PCA distribution". Prior Level of Function : Independent/Modified Independent;Working/employed. Per wife: pt able to walk around without issue, Indepentdent without use of AD, however has a history of severla falls in past 2 years which she attributes to his bilat sensory neuropathy. He has been largely noncompliant with RW  recommendations since Feb 2024. Pt DC to inpatient rehab in [place], then home with HHPT.   PAIN:  Are you having pain? No  PRECAUTIONS: Fall   WEIGHT BEARING RESTRICTIONS: No  FALLS:  Has patient fallen in last 6 months? Yes. Number of falls: unsure  LIVING ENVIRONMENT: Lives with: Wife Stairs: Yes: 8 entry stairs with 1 railing  Has following equipment at home: Dan Humphreys - 2 wheeled  OCCUPATION: Radiographer, therapeutic" makes crafts and art   PLOF: Independent with basic ADLs  PATIENT GOALS: Wife desires improved safety and stamina in the home, falls avoidance   NEXT MD VISIT: unknown  OBJECTIVE:   PATIENT SURVEYS:  FOTO: not appropriate due to memory/awareness deficits   COGNITION: Overall cognitive status: memory/awareness deficits     SENSATION: Per wife: sensory neuropathy BLE   FUNCTIONAL TESTS:  -5xSTS: 14.76sec hands free, no LOB  - : 94ft (several LOB when turning), no device, needs cues for navigation due to memory deficits  -TUG: 9.88sec, no device, no LOB   Balance Assessment: -SLS: ~3 sec bilat (large amplitude righting, full body)  -eyes closed firm: 30sec, mild to no sway -eyes closed foam: ~20 sec (sway uncontrolled), heavy sway increase -Berg Balance Test: pending     TODAY'S TREATMENT:                                                                                                                              DATE: 02/28/23   THEREX:    Standing calf raises 5# x 2x15  Sit to stand x 12 reps each without UE support     NMR: At support surface, gait belt donned Standing: Step tap 5# AW x 15 rep each each LE Step up 5# AW with 1 UE support x 10 reps each LE Lateral step over 5# AW x 15 reps each way Walking around 6 cones positioned on floor- Figure 8 including several 180 deg turns- Shuffling gait with VC to take a larger step (using 5# AW and no AD- CGA)  Walking - forward/retro 5-10 feet toward a cone- and then figure 8 walking focusing on  turning and shortening turning radius while maintaining safety.  Walking without resistance in hallway performing horizontal head turns and vertical  head motions     PATIENT EDUCATION:  Education details: Pt educated throughout session about proper posture and technique with exercises. Improved exercise technique, movement at target joints, use of target muscles after min to mod verbal, visual, tactile cues.   Person educated: Patient, wife  Education method: We spoke about it Education comprehension: Pt verbalized understanding of message, however anticipate need to repeat and emphasize message due to empirical evidence that pt's only recall ~70% of education from their provider.   HOME EXERCISE PROGRAM: Access Code: [email or web address] URL: [email or web address] Date: 02/02/2023 Prepared by: Maureen Ralphs  Exercises - Sit to Stand with Arms Crossed  - 3 x weekly - 3 sets - 10 reps - Standing Hip Abduction with Counter Support  - 3 x weekly - 3 sets - 10 reps - Standing Heel Raises  - 3 x weekly - 3 sets - 10 reps - 3 hold  ASSESSMENT:  CLINICAL IMPRESSION: Patient presented with good motivation for treatment today. He was able to participate in several balance exercises well. He experienced some difficulty with turning today and coordination activities. He did present with some in session progress- able to negotiate better around cones without as much unsteadiness.  Patient will benefit from continued skilled PT services to improve his overall functional LE strength and mobility to decrease fall risk and improve overall quality of life.   OBJECTIVE IMPAIRMENTS: Abnormal gait, cardiopulmonary status limiting activity, decreased activity tolerance, decreased balance, decreased coordination, decreased mobility, difficulty walking, decreased strength, impaired flexibility, impaired sensation, impaired vision/perception, improper body mechanics, and postural dysfunction.  ACTIVITY  LIMITATIONS: carrying, lifting, bending, sitting, standing, squatting, stairs, transfers, bed mobility, dressing, reach overhead, hygiene/grooming, locomotion level, and caring for others  PARTICIPATION LIMITATIONS: meal prep, cleaning, laundry, personal finances, driving, shopping, community activity, occupation, and yard work  PERSONAL FACTORS: Age, Fitness, Past/current experiences, Time since onset of injury/illness/exacerbation, and 3+ comorbidities REHAB POTENTIAL: Good  CLINICAL DECISION MAKING: Evolving/moderate complexity  EVALUATION COMPLEXITY: moderate  SHORT TERM GOALS: Target date: 03/01/23  Patient to report compliance and success with home based program as prescribed by this site in order to help with pt self-efficacy in outcomes and symptoms management.  Baseline: HEP to be issued at visit 2 Goal status: INITIAL    LONG TERM GOALS: Target date: 11/30  Pt to demonstrate improved power in legs, activity tolerance, and dynamic balance AEB improved 5xSTS and TUG.  Baseline: eval: 5xSTS hands free: 15+seconds; TUG c device in 9.86 sec Goal status: INITIAL  2.  Pt to demonstrate improved tolerance and distance in by >12% to indicate improved ability to perform tasks in community for IADL.   Baseline: eval: : 939ft, no device, several LOB with turning Goal status: INITIAL  3.  Pt to report more consistent use of 2-hand AD wen performing AMB outside of the house.  Baseline: intemrittent, inconsistent use of DME when AMB out of house.  Goal status: INITIAL  4. Pt will improve BERG by at least 3 points in order to demonstrate clinically significant improvement in balance.   Baseline: 02/02/2023= 42/56; 02/14/2023= 40/56  Goal Status: ONGOING PLAN:  PT FREQUENCY: 2x/week  PT DURATION: 2 months   PLANNED INTERVENTIONS: Therapeutic exercises, Therapeutic activity, Neuromuscular re-education, Balance training, Gait training, Patient/Family education, Self Care, and  Joint mobilization.  PLAN FOR NEXT SESSION:  dual tasking, AMB with RW , progress therex and balance training as appropriate, endurance   10:35 AM, 02/28/23 Louis Meckel, PT  Physical Therapist - [place]  [facility]  Outpatient Physical Therapy- Main [facility] [phone]   '

## 2023-02-28 NOTE — Therapy (Addendum)
OUTPATIENT PHYSICAL THERAPY TREATMENT  Patient Name: Clayton [name] MRN: [id number] DOB:Oct 12, 1934, 89, Male Today's Date: 02/21/2023  END OF SESSION:.  PT End of Session - 03/02/23 1101     Visit Number 8    Number of Visits 16    Date for PT Re-Evaluation 04/01/23    Authorization Type [employer] Choice PPO    Authorization Time Period 01/30/23-04/01/23    Progress Note Due on Visit 10    PT Start Time 1146    PT Stop Time 1229    PT Time Calculation (min) 43 min    Equipment Utilized During Treatment Gait belt    Activity Tolerance Patient tolerated treatment well;No increased pain    Behavior During Therapy [facility] for tasks assessed/performed              *SmartPhrase corrected 04/19/23     Past Medical History:  Diagnosis Date   Acute gastrointestinal hemorrhage     Acute hypoxemic respiratory failure (HCC) 05/25/2022   Acute thrombotic stroke (HCC) 12/14/2022   Basal cell carcinoma     Blood in stool 08/30/2018   Blood transfusion without reported diagnosis 14 (age 50)    After traumatic brain injury   Broken ribs      2 BROKEN ON RIBS   CKD (chronic kidney disease)      PT DENIES   Color blindness     DVT (deep venous thrombosis) (HCC)      RIGHT LEG   Dyslexia     Fracture dislocation of wrist      RIGHT   GI bleed 09/02/2018   Hiatal hernia with GERD     HTN (hypertension)     Hypokalemia 05/26/2022   Hyponatremia 05/25/2022   Symptomatic anemia 09/03/2018   TBI (traumatic brain injury) (HCC)      w/skull Fx and crani in 1960s          *SmartPhrase corrected 04/19/23      Past Surgical History:  Procedure Laterality Date   COLONOSCOPY   2014    cleared for 5 yrs   COLONOSCOPY N/A 09/01/2018    Procedure: COLONOSCOPY;  Surgeon: Pasty Spillers, MD;  Location: [facility] ENDOSCOPY;  Service: Endoscopy;  Laterality: N/A;   CRANIECTOMY FOR DEPRESSED SKULL FRACTURE   1960'S   ESOPHAGOGASTRODUODENOSCOPY N/A 09/01/2018    Procedure:  ESOPHAGOGASTRODUODENOSCOPY (EGD);  Surgeon: Pasty Spillers, MD;  Location: [facility] ENDOSCOPY;  Service: Endoscopy;  Laterality: N/A;   HERNIA REPAIR        X2   IVC FILTER REMOVAL N/A 07/03/2017    Procedure: IVC FILTER REMOVAL;  Surgeon: Annice Needy, MD;  Location: [facility] INVASIVE CV LAB;  Service: Cardiovascular;  Laterality: N/A;   LUMBAR LAMINECTOMY/DECOMPRESSION MICRODISCECTOMY N/A 04/20/2020    Procedure: L2/3, L3/4 LAMINECTOMY;  Surgeon: Lucy Chris, MD;  Location: [facility] ORS;  Service: Neurosurgery;  Laterality: N/A;  2ND CASE   PERIPHERAL VASCULAR THROMBECTOMY Right 05/22/2017    Procedure: PERIPHERAL VASCULAR THROMBECTOMY;  Surgeon: Annice Needy, MD;  Location: [facility] INVASIVE CV LAB;  Service: Cardiovascular;  Laterality: Right;   SPINE SURGERY   December 2021   TONSILLECTOMY                 PCP: Tresa Garter MD  REFERRING PROVIDER: Wynn Banker MD  REFERRING DIAG: CVA  Rationale for Evaluation and Treatment: Rehabilitation  THERAPY DIAG:  No diagnosis found.  ONSET DATE: 12/12/22  SUBJECTIVE:  SUBJECTIVE STATEMENT: Patient brought in jewelry and boxes he made. No falls since last session.    PERTINENT HISTORY:  Clayton [name] is a 88 y.o. male with medical history significant of cognitive impairment, history of TBI, hypertension presenting with weakness, confusion. Admitted to [facility] 12/12/22 for worsening weakness and cognitive impairment. Per MRI impression: "Scattered small acute to subacute infarcts in the right PCA distribution". Prior Level of Function : Independent/Modified Independent;Working/employed. Per wife: pt able to walk around without issue, Indepentdent without use of AD, however has a history of severla falls in past 2 years which she attributes to his bilat sensory neuropathy.  He has been largely noncompliant with RW recommendations since Feb 2024. Pt DC to inpatient rehab in [place], then home with HHPT.   PAIN:  Are you having pain? No  PRECAUTIONS: Fall   WEIGHT BEARING RESTRICTIONS: No  FALLS:  Has patient fallen in last 6 months? Yes. Number of falls: unsure  LIVING ENVIRONMENT: Lives with: Wife Stairs: Yes: 8 entry stairs with 1 railing  Has following equipment at home: Dan Humphreys - 2 wheeled  OCCUPATION: Radiographer, therapeutic" makes crafts and art   PLOF: Independent with basic ADLs  PATIENT GOALS: Wife desires improved safety and stamina in the home, falls avoidance   NEXT MD VISIT: unknown  OBJECTIVE:   PATIENT SURVEYS:  FOTO: not appropriate due to memory/awareness deficits   COGNITION: Overall cognitive status: memory/awareness deficits     SENSATION: Per wife: sensory neuropathy BLE   FUNCTIONAL TESTS:  -5xSTS: 14.76sec hands free, no LOB  - : 968ft (several LOB when turning), no device, needs cues for navigation due to memory deficits  -TUG: 9.88sec, no device, no LOB   Balance Assessment: -SLS: ~3 sec bilat (large amplitude righting, full body)  -eyes closed firm: 30sec, mild to no sway -eyes closed foam: ~20 sec (sway uncontrolled), heavy sway increase -Berg Balance Test: pending     TODAY'S TREATMENT:                                                                                                                              DATE: 03/02/23   THEREX:    Heel toe raises 20x Sit to stand x 12 reps each without UE support   Seated lateral step over hedgehog 15x each side  Seated BTB row 10x Seated alphabet ; challenging to patient; needs assistance for letters   NMR: At support surface, gait belt donned  Standing: Lateral step up 6" step 5# AW x 15 rep each each LE 6" Step up 5# AW with 1 UE support x 10 reps each LE  Ambulate 150 ft with 5lb ankle weights with CGA   Airex pad:  -5lb bar chest press 10x, overhead  press 10x -eyes closed 30 seconds x 2 trials -tandem stance 2x30 seconds each LE set  Seated on dynadisc: -static sit 60 seconds -lateral weight shift 10x each side   Activity Description: 3 on  floor 3 on table; first 2 sets one color last 2 sets red-right green-left Activity Setting:  The Blaze Pod Random setting was chosen to enhance cognitive processing and agility, providing an unpredictable environment to simulate real-world scenarios, and fostering quick reactions and adaptability.   Number of Pods:  6 Cycles/Sets:  4 Duration (Time or Hit Count):  30 seconds      PATIENT EDUCATION:  Education details: Pt educated throughout session about proper posture and technique with exercises. Improved exercise technique, movement at target joints, use of target muscles after min to mod verbal, visual, tactile cues.   Person educated: Patient, wife  Education method: We spoke about it Education comprehension: Pt verbalized understanding of message, however anticipate need to repeat and emphasize message due to empirical evidence that pt's only recall ~70% of education from their provider.   HOME EXERCISE PROGRAM: Access Code: [email or web address] URL: [email or web address] Date: 02/02/2023 Prepared by: Maureen Ralphs  Exercises - Sit to Stand with Arms Crossed  - 3 x weekly - 3 sets - 10 reps - Standing Hip Abduction with Counter Support  - 3 x weekly - 3 sets - 10 reps - Standing Heel Raises  - 3 x weekly - 3 sets - 10 reps - 3 hold  ASSESSMENT:  CLINICAL IMPRESSION: Patient is challenged with dual task with cognitive component of R and L tapping. Patient is fatigued with lateral step ups with weights indicating need for continued strengthening of LE's.  Patient will benefit from continued skilled PT services to improve his overall functional LE strength and mobility to decrease fall risk and improve overall quality of life.   OBJECTIVE IMPAIRMENTS: Abnormal gait,  cardiopulmonary status limiting activity, decreased activity tolerance, decreased balance, decreased coordination, decreased mobility, difficulty walking, decreased strength, impaired flexibility, impaired sensation, impaired vision/perception, improper body mechanics, and postural dysfunction.  ACTIVITY LIMITATIONS: carrying, lifting, bending, sitting, standing, squatting, stairs, transfers, bed mobility, dressing, reach overhead, hygiene/grooming, locomotion level, and caring for others  PARTICIPATION LIMITATIONS: meal prep, cleaning, laundry, personal finances, driving, shopping, community activity, occupation, and yard work  PERSONAL FACTORS: Age, Fitness, Past/current experiences, Time since onset of injury/illness/exacerbation, and 3+ comorbidities REHAB POTENTIAL: Good  CLINICAL DECISION MAKING: Evolving/moderate complexity  EVALUATION COMPLEXITY: moderate  SHORT TERM GOALS: Target date: 03/01/23  Patient to report compliance and success with home based program as prescribed by this site in order to help with pt self-efficacy in outcomes and symptoms management.  Baseline: HEP to be issued at visit 2 Goal status: INITIAL    LONG TERM GOALS: Target date: 11/30  Pt to demonstrate improved power in legs, activity tolerance, and dynamic balance AEB improved 5xSTS and TUG.  Baseline: eval: 5xSTS hands free: 15+seconds; TUG c device in 9.86 sec Goal status: INITIAL  2.  Pt to demonstrate improved tolerance and distance in by >12% to indicate improved ability to perform tasks in community for IADL.   Baseline: eval: : 968ft, no device, several LOB with turning Goal status: INITIAL  3.  Pt to report more consistent use of 2-hand AD wen performing AMB outside of the house.  Baseline: intemrittent, inconsistent use of DME when AMB out of house.  Goal status: INITIAL  4. Pt will improve BERG by at least 3 points in order to demonstrate clinically significant improvement in balance.    Baseline: 02/02/2023= 42/56; 02/14/2023= 40/56  Goal Status: ONGOING PLAN:  PT FREQUENCY: 2x/week  PT DURATION: 2 months   PLANNED INTERVENTIONS: Therapeutic exercises, Therapeutic activity, Neuromuscular re-education,  Balance training, Gait training, Patient/Family education, Self Care, and Joint mobilization.  PLAN FOR NEXT SESSION:  dual tasking, AMB with RW , progress therex and balance training as appropriate, endurance   1:29 PM, 03/02/23 Precious Bard PT   Physical Therapist - [place] [facility]  Outpatient Physical Therapy- Main [facility] [phone]   '

## 2023-02-28 NOTE — Therapy (Signed)
OUTPATIENT OCCUPATIONAL THERAPY NEURO TREATMENT NOTE  Patient Name: RAEGAN DAILY MRN: [id number] DOB:03/05/1935, 88 y.o., male Today's Date: 02/28/2023  PCP: Leonor Liv  REFERRING PROVIDER: Clydie Braun   END OF SESSION:  OT End of Session - 02/28/23 1009     Visit Number 9    Number of Visits 24    Date for OT Re-Evaluation 04/24/23    OT Start Time 0845    OT Stop Time 0930    OT Time Calculation (min) 45 min    Equipment Utilized During Treatment w/c    Activity Tolerance Patient tolerated treatment well    Behavior During Therapy [facility] for tasks assessed/performed            Past Medical History:  Diagnosis Date   Acute gastrointestinal hemorrhage    Acute hypoxemic respiratory failure (HCC) 05/25/2022   Acute thrombotic stroke (HCC) 12/14/2022   Basal cell carcinoma    Blood in stool 08/30/2018   Blood transfusion without reported diagnosis 35 (age 28)   After traumatic brain injury   Broken ribs    2 BROKEN ON RIBS   CKD (chronic kidney disease)    PT DENIES   Color blindness    DVT (deep venous thrombosis) (HCC)    RIGHT LEG   Dyslexia    Fracture dislocation of wrist    RIGHT   GI bleed 09/02/2018   Hiatal hernia with GERD    HTN (hypertension)    Hypokalemia 05/26/2022   Hyponatremia 05/25/2022   Symptomatic anemia 09/03/2018   TBI (traumatic brain injury) (HCC)    w/skull Fx and crani in 1960s   Past Surgical History:  Procedure Laterality Date   COLONOSCOPY  2014   cleared for 5 yrs   COLONOSCOPY N/A 09/01/2018   Procedure: COLONOSCOPY;  Surgeon: Pasty Spillers, MD;  Location: [facility] ENDOSCOPY;  Service: Endoscopy;  Laterality: N/A;   CRANIECTOMY FOR DEPRESSED SKULL FRACTURE  1960'S   ESOPHAGOGASTRODUODENOSCOPY N/A 09/01/2018   Procedure: ESOPHAGOGASTRODUODENOSCOPY (EGD);  Surgeon: Pasty Spillers, MD;  Location: [facility] ENDOSCOPY;  Service: Endoscopy;  Laterality: N/A;   HERNIA REPAIR     X2   IVC FILTER REMOVAL N/A  07/03/2017   Procedure: IVC FILTER REMOVAL;  Surgeon: Annice Needy, MD;  Location: [facility] INVASIVE CV LAB;  Service: Cardiovascular;  Laterality: N/A;   LUMBAR LAMINECTOMY/DECOMPRESSION MICRODISCECTOMY N/A 04/20/2020   Procedure: L2/3, L3/4 LAMINECTOMY;  Surgeon: Lucy Chris, MD;  Location: [facility] ORS;  Service: Neurosurgery;  Laterality: N/A;  2ND CASE   PERIPHERAL VASCULAR THROMBECTOMY Right 05/22/2017   Procedure: PERIPHERAL VASCULAR THROMBECTOMY;  Surgeon: Annice Needy, MD;  Location: [facility] INVASIVE CV LAB;  Service: Cardiovascular;  Laterality: Right;   SPINE SURGERY  December 2021   TONSILLECTOMY     Patient Active Problem List   Diagnosis Date Noted   Insomnia due to medical condition 12/29/2022   Elevated low density lipoprotein (LDL) cholesterol level 12/29/2022   Generalized anxiety disorder 12/29/2022   GERD (gastroesophageal reflux disease) 12/29/2022   ARF (acute renal failure) (HCC) 12/29/2022   Stroke (cerebrum) (HCC) 12/15/2022   Acute thrombotic stroke (HCC) 12/14/2022   Acute delirium 12/14/2022   Overweight (BMI 25.0-29.9) 12/13/2022   Weakness 12/12/2022   Cognitive impairment 12/12/2022   SOB (shortness of breath) 06/02/2022   Acute respiratory failure with hypoxia (HCC) 05/25/2022   Multifocal pneumonia 05/24/2022   Falls 05/24/2022   Acute left-sided low back pain with left-sided sciatica 02/25/2020   Lumbar stenosis with neurogenic  claudication 02/25/2020   Diverticulosis of colon with hemorrhage    Diverticulosis of small intestine without hemorrhage    HTN (hypertension) 08/30/2018   Deep vein thrombosis (DVT) of proximal vein of right lower extremity (HCC) 06/14/2017   ONSET DATE: 12/12/22  REFERRING DIAG: CVA  THERAPY DIAG:  Muscle weakness (generalized)  Other lack of coordination  Rationale for Evaluation and Treatment: Rehabilitation  SUBJECTIVE:  SUBJECTIVE STATEMENT:  Pt.'s wife reports that his daughter brought wood crafting projects to him,  however his wife did not have him use it due safety concerns with the sharp knife/tool set.  Pt. Had a follow-up MD appointment last week.  Pt accompanied by: significant other  PERTINENT HISTORY: DELANDO PUJOLS is a 88 y.o. male with medical history significant of cognitive impairment, history of TBI, hypertension presenting with weakness, confusion. Admitted to [facility] 12/12/22 for worsening weakness and cognitive impairment. Per MRI impression: "Scattered small acute to subacute infarcts in the right PCA distribution". Completed inpatient rehab stay from 12/15/22 to 12/28/22.   PRECAUTIONS: Fall  WEIGHT BEARING RESTRICTIONS: No  PAIN:  Are you having pain? No  FALLS: Has patient fallen in last 6 months? Yes. 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  OBJECTIVE:   HAND DOMINANCE: Right  ADLs: Overall ADLs: MIN A + cues for initiation  Transfers/ambulation related to ADLs: SUPERVISION with RW Eating: SETUP Grooming: SETUP + cues for initiation  UB Dressing: SETUP LB Dressing: MIN A Toileting: cueing Bathing: MIN A  Tub Shower transfers: MOD A  Equipment: Shower seat with back  IADLs: Shopping: spouse completes Light housekeeping: spouse completes Meal Prep: spouse completes Community mobility: spouse completes Medication management: spouse completes Landscape architect: spouse completes Handwriting: 100% legible  MOBILITY STATUS: Needs Assist: RW  POSTURE COMMENTS:  No Significant postural limitations  ACTIVITY TOLERANCE: Activity tolerance: 5 min standing tolerance   FUNCTIONAL OUTCOME MEASURES: FOTO: 98  pt demonstrates poor insight into  deficits   UPPER EXTREMITY ROM:  WFL   UPPER EXTREMITY MMT:   WFL  HAND FUNCTION: Grip strength: Right: 60 lbs; Left: 42 lbs, Lateral pinch: Right: 24 lbs, Left: 22 lbs, and 3 point pinch: Right: 20 lbs, Left: 16 lbs  COORDINATION: 9 Hole Peg test: Right: 30 sec; Left: 38 sec  SENSATION: WFL Not tested  EDEMA: none   COGNITION: Overall cognitive status: Impaired Completed clock draw with mildly impaired number spacing, unable to set correct times for minute/hand.  VISION: Subjective report:  Baseline vision: Wears glasses all the time Visual history:   VISION ASSESSMENT: To be further assessed in functional context  Patient has difficulty with following activities due to following visual impairments: L inattention Completed Albert's test with positive indicator for Unilateral Spatial Neglect - 10 lines on L lower/middle quadrant missed   OBSERVATIONS: Pt. presents with spouse for OT evaluation.    TODAY'S TREATMENT:  DATE: 02/28/2023  Therapeutic Activities:  Pt. worked Administrator a small model race car. Pt. worked on visually scanning, and sorting the extra Holiday representative scattered randomly at the tabletop surface prior to working on Holiday representative of the vehicle. Pt. worked on connecting the [employer], connecting them, and securing them with [employer], and handheld wrenches. Pt. required step-by-step cues to follow the directions for constructing the the vehicle.       PATIENT EDUCATION: Education details:left sided awareness, FMC Person educated: Spouse Education method: Explanation Education comprehension: verbalized understanding  HOME EXERCISE PROGRAM:  Continue to assess ongoing need for HEPs.  GOALS: Goals reviewed with patient? Yes  SHORT TERM GOALS: Target date: 03/13/23  Pt and caregiver will IND complete  HEP for improved grip strength and dexterity.  Baseline: no HEP Goal status: INITIAL  LONG TERM GOALS: Target date: 04/24/23  Pt will increase L grip strength by 10# to open jars. Baseline: L grip 42# Goal status: INITIAL  2.  Pt will increase activity tolerance for standing dynamic tasks to 15 min to assist with IADLs Baseline: 5 min standing Goal status: INITIAL  3.  Pt will IND don pants with no cues from caregiver for sequencing.  Baseline: MIN A to don pants Goal status: INITIAL  4.  Pt/caregiver will verbalize plan to implement x3 visual compensation strategies for improved I/ADL performance.  Baseline: none Goal status: INITIAL  5.  Pt will improve non-dominant LUE dexterity by 10 seconds for return to leisure activity of wood carving. Baseline: 9 hole peg test 38 sec Goal status: INITIAL   ASSESSMENT:  CLINICAL IMPRESSION:  Pt. was able to attend to the task, and securely hold the unstable construction vehicle with the left hand while connecting the small pieces, and using the tools in the small confined spaces. Pt. was able to modulate the grasp required to manipulate the small, intricate pieces, and securely fasten them within the small spaces. Pt.continues to benefit from OT services to improve engagement of LUE during ADLs, increased tolerance for IADLs, and apply compensatory strategies to decreased caregiver burden.  PERFORMANCE DEFICITS: in functional skills including ADLs, IADLs, and dexterity, cognitive skills including attention, emotional, memory, orientation, problem solving, and safety awareness, and psychosocial skills including coping strategies and environmental adaptation.   IMPAIRMENTS: are limiting patient from ADLs, IADLs, and leisure.   CO-MORBIDITIES: has co-morbidities such as TBI  that affects occupational performance. Patient will benefit from skilled OT to address above impairments and improve overall function.  MODIFICATION OR ASSISTANCE TO  COMPLETE EVALUATION: Min-Moderate modification of tasks or assist with assess necessary to complete an evaluation.  OT OCCUPATIONAL PROFILE AND HISTORY: Detailed assessment: Review of records and additional review of physical, cognitive, psychosocial history related to current functional performance.  CLINICAL DECISION MAKING: Moderate - several treatment options, min-mod task modification necessary  REHAB POTENTIAL: Fair limited by cognition/STM  EVALUATION COMPLEXITY: Moderate    PLAN:  OT FREQUENCY: 1-2x/week  OT DURATION: 12 weeks  PLANNED INTERVENTIONS: self care/ADL training, therapeutic exercise, therapeutic activity, patient/family education, cognitive remediation/compensation, visual/perceptual remediation/compensation, energy conservation, and DME and/or AE instructions  RECOMMENDED OTHER SERVICES: SLP, PT  CONSULTED AND AGREED WITH PLAN OF CARE: Patient and family member/caregiver  PLAN FOR NEXT SESSION: initiate HEP  Olegario Messier, MS, OTR/L  02/28/2023, 10:12 AM

## 2023-03-02 ENCOUNTER — Ambulatory Visit: Payer: Medicare PPO | Admitting: Speech Pathology

## 2023-03-02 ENCOUNTER — Ambulatory Visit: Payer: Medicare PPO | Admitting: Occupational Therapy

## 2023-03-02 ENCOUNTER — Ambulatory Visit: Payer: Medicare PPO

## 2023-03-02 DIAGNOSIS — M6281 Muscle weakness (generalized): Secondary | ICD-10-CM

## 2023-03-02 DIAGNOSIS — R2681 Unsteadiness on feet: Secondary | ICD-10-CM

## 2023-03-02 DIAGNOSIS — R41841 Cognitive communication deficit: Secondary | ICD-10-CM

## 2023-03-02 DIAGNOSIS — R262 Difficulty in walking, not elsewhere classified: Secondary | ICD-10-CM

## 2023-03-02 DIAGNOSIS — I6389 Other cerebral infarction: Secondary | ICD-10-CM

## 2023-03-02 DIAGNOSIS — R278 Other lack of coordination: Secondary | ICD-10-CM | POA: Diagnosis not present

## 2023-03-02 DIAGNOSIS — R296 Repeated falls: Secondary | ICD-10-CM | POA: Diagnosis not present

## 2023-03-02 NOTE — Therapy (Signed)
OUTPATIENT SPEECH LANGUAGE PATHOLOGY  TREATMENT NOTE   Patient Name: Clayton [name] MRN: [id number] DOB:19-Feb-1935, 88 y.o., male Today's Date: 03/02/2023  PCP: Leonor Liv, MD REFERRING PROVIDER: Claudette Laws, MD   End of Session - 03/02/23 1103     Visit Number 7    Number of Visits 25    Date for SLP Re-Evaluation 04/24/23    Authorization Type [employer] PPO    Authorization Time Period 01/30/2023 thru 03/31/2023    Authorization - Visit Number 7    Authorization - Number of Visits 18    Progress Note Due on Visit 10    SLP Start Time 1105    SLP Stop Time  1145    SLP Time Calculation (min) 40 min    Activity Tolerance Patient tolerated treatment well             No past medical history on file.  The histories are not reviewed yet. Please review them in the "History" navigator section and refresh this SmartLink. Patient Active Problem List   Diagnosis Date Noted   Insomnia due to medical condition 12/29/2022   Elevated low density lipoprotein (LDL) cholesterol level 12/29/2022   Generalized anxiety disorder 12/29/2022   GERD (gastroesophageal reflux disease) 12/29/2022   ARF (acute renal failure) (HCC) 12/29/2022   Stroke (cerebrum) (HCC) 12/15/2022   Acute thrombotic stroke (HCC) 12/14/2022   Acute delirium 12/14/2022   Overweight (BMI 25.0-29.9) 12/13/2022   Weakness 12/12/2022   Cognitive impairment 12/12/2022   SOB (shortness of breath) 06/02/2022   Acute respiratory failure with hypoxia (HCC) 05/25/2022   Multifocal pneumonia 05/24/2022   Falls 05/24/2022   Acute left-sided low back pain with left-sided sciatica 02/25/2020   Lumbar stenosis with neurogenic claudication 02/25/2020   Diverticulosis of colon with hemorrhage    Diverticulosis of small intestine without hemorrhage    HTN (hypertension) 08/30/2018   Deep vein thrombosis (DVT) of proximal vein of right lower extremity (HCC) 06/14/2017    ONSET DATE: pt's wife  reports gradual decline ~ 2 years ago, Cognitive Impairment initially dx on 12/12/2022; Right PCA CVA 12/12/2022; date of referral 01/16/2023   REFERRING DIAG: I63.531 (ICD-10-CM) - Acute right PCA stroke (HCC)  THERAPY DIAG:  Cognitive communication deficit  Cerebrovascular accident (CVA) due to other mechanism [facility])  Rationale for Evaluation and Treatment Rehabilitation  SUBJECTIVE:   PERTINENT HISTORY and DIAGNOSTIC FINDINGS: Pt is a 88 year old male with past medical history significant for TBI, hypertension, hyperlipidemia with multiple hospitalizations in January-February 2024 for pneumonia. While pt's wife reports gradual decrease in pt's vocal intensity and cognition, she reports singificant decline in memory and voice following this hospitalization (chart review reveals ambulatory referral made to neurology for concern regarding "mild cognitive impairment and dementia"). Pt seen by outpatient neurology on 06/28/2022 with MRI scheduled d/t concern with worsening memory loss. MRI 07/13/2022 - IMPRESSION: 1. No acute abnormality or reversible cause of memory loss. 2.  Cerebral atrophy. Pt with follow up appointment with Outpatient Neurology on 08/15/2022 with improved restlessness at night and mildly improved mood, ongiong unsteadiness while walking and unchanged memory deficits. Per chart, "discussed at length prognosis and safety concerns in setting of mild to moderate dementia." On 12/12/2022 pt presented to [facility] ED on 12/12/2022 with confusion. MRI confirmed (12/12/2022) FINDINGS: Small areas of restricted diffusion in the right occipital cortex, right hippocampus, and right thalamus. Other than the occipital cortex, diffusion restriction is fairly weak. Symmetric diffusion hyperintensity along the periphery of the  bilateral cerebellum is likely artifactual. Generalized cerebral volume loss. No acute hemorrhage, hydrocephalus, mass, or collection. Mild chronic small vessel ischemia in the  cerebral white matter. IMPRESSION: 1. Scattered small acute to subacute infarcts in the right PCA distribution 2. Generalized brain atrophy. [facility] until 12/28/2022. Pt with homehealth therapies in interm of Outpatient services. Follow up outpatient neurology on 01/17/2023 reveales worsening short and long term meory loss "memory evaluation today is 11/30."   This is in a largely unaided person with documented moderate bilateral sensory neural hearing loss (2015).   PAIN:  Are you having pain? No   FALLS: Has patient fallen in last 6 months?  See PT evaluation for details  LIVING ENVIRONMENT: Lives with: lives with their spouse Lives in: House/apartment  PLOF:  Level of assistance: Needed assistance with ADLs, Needed assistance with IADLS Employment: Retired   PATIENT GOALS   caregiver expresses desire to be able to facilitate recovery better  SUBJECTIVE STATEMENT: Pt arrived after OT, pt discharged from OT today Pt accompanied by: significant other  OBJECTIVE:     TODAY'S TREATMENT:  Skilled treatment session focused on pt's cognitive communication goals. SLP facilitated session by providing the following interventions:    Reminiscence therapy - targeting pt's wood working Pt with increased verbal output and organization of thought    SLP also provided education to pt's wife about trusting herself to know pt and his emotional response to cues/tasks  PATIENT EDUCATION: Education details: See above Person educated: Patient and Spouse Education method: Explanation Education comprehension: needs further education  HOME EXERCISE PROGRAM:        Wear hearing aids Read  Establish a schedule  Complete ADLs/iADLs together  Look thru memory books/picture books    GOALS:  Goals reviewed with patient? Yes  SHORT TERM GOALS: Target date: 10 sessions  Pt will complete formal cognitive communication evaluation.  Baseline: Goal status:  INITIAL   2.  Pt will refer to external memory aids for accurate recall of orientation information in 6 out of 7 opportunities.  Baseline: oriented to self and town Goal status: INITIAL  3.  With Min A, pt will complete Expiratory Muscle Training (EMT) across 30 trials i at 50-70% of MEP improve coordination of respiration and phonation and maximize breath support for speech. Baseline:  Goal status: INITIAL  4.  With Moderate assistance, pt will utilize speech intelligibility strategies to achieve > 80% speech intelligibility at the sentence level.  Baseline:  Goal status: INITIAL   LONG TERM GOALS: Target date: 04/24/2023  With Min A, patient will use external aids to manage appointments, chores, shopping, and log vital signs.    Baseline:  Goal status: INITIAL  2.  Pt will report improved cognitive communication via PROM by 5 points at last ST session   Baseline:  Goal status: INITIAL  3.  Pt will complete HEP with moderate assistance across 5 out of 7 opportunities.  Baseline:  Goal status: INITIAL   ASSESSMENT:  CLINICAL IMPRESSION:  Patient is a 88 y.o. male who was seen today for a cognitive communication evaluation following a right PCA CVA in the setting of pre-existing cognitive decline and TBI as well as chronically unaided moderate bilateral sensorineural hearing loss.    See the above treatment note for details regarding today's session.     OBJECTIVE IMPAIRMENTS include attention, memory, awareness, executive functioning, and dysarthria. These impairments are limiting patient from managing medications, managing appointments, managing finances, household responsibilities, ADLs/IADLs,  and effectively communicating at home and in community. Factors affecting potential to achieve goals and functional outcome are ability to learn/carryover information, co-morbidities, previous level of function, severity of impairments, and unaided hearing loss .Marland Kitchen Patient will benefit  from skilled SLP services to address above impairments and improve overall function.  REHAB POTENTIAL: Fair pre-existing cognitive decline, chronically unaided hearing loss, severity of memory loss at present  PLAN: SLP FREQUENCY: 1-2x/week  SLP DURATION: 12 weeks  PLANNED INTERVENTIONS: Environmental controls, Cueing hierachy, Cognitive reorganization, Internal/external aids, Functional tasks, SLP instruction and feedback, Compensatory strategies, and Patient/family education    Rayetta Veith B. Dreama Saa, M.S., CCC-SLP, Tree surgeon Certified Brain Injury Specialist [facility]  [facility] Rehabilitation Services Office [phone] Ascom [phone] Fax [phone]

## 2023-03-02 NOTE — Therapy (Addendum)
Occupational Therapy Progress/Discharge Note  Dates of reporting period  01/30/2023   to   03/02/2023   Patient Name: Clayton [name] MRN: [id number] DOB:14-Sep-1934, 88 y.o., male Today's Date: 03/02/2023  PCP: Clayton [name]  REFERRING PROVIDER: Clydie [name]   END OF SESSION:  OT End of Session - 03/02/23 1031     Visit Number 10    Number of Visits 24    Date for OT Re-Evaluation 04/24/23    OT Start Time 1015    OT Stop Time 1100    OT Time Calculation (min) 45 min    Activity Tolerance Patient tolerated treatment well    Behavior During Therapy [facility] for tasks assessed/performed            Past Medical History:  Diagnosis Date   Acute gastrointestinal hemorrhage    Acute hypoxemic respiratory failure (HCC) 05/25/2022   Acute thrombotic stroke (HCC) 12/14/2022   Basal cell carcinoma    Blood in stool 08/30/2018   Blood transfusion without reported diagnosis 36 (age 50)   After traumatic brain injury   Broken ribs    2 BROKEN ON RIBS   CKD (chronic kidney disease)    PT DENIES   Color blindness    DVT (deep venous thrombosis) (HCC)    RIGHT LEG   Dyslexia    Fracture dislocation of wrist    RIGHT   GI bleed 09/02/2018   Hiatal hernia with GERD    HTN (hypertension)    Hypokalemia 05/26/2022   Hyponatremia 05/25/2022   Symptomatic anemia 09/03/2018   TBI (traumatic brain injury) (HCC)    w/skull Fx and crani in 1960s   Past Surgical History:  Procedure Laterality Date   COLONOSCOPY  2014   cleared for 5 yrs   COLONOSCOPY N/A 09/01/2018   Procedure: COLONOSCOPY;  Surgeon: Pasty Spillers, MD;  Location: [facility] ENDOSCOPY;  Service: Endoscopy;  Laterality: N/A;   CRANIECTOMY FOR DEPRESSED SKULL FRACTURE  1960'S   ESOPHAGOGASTRODUODENOSCOPY N/A 09/01/2018   Procedure: ESOPHAGOGASTRODUODENOSCOPY (EGD);  Surgeon: Pasty Spillers, MD;  Location: [facility] ENDOSCOPY;  Service: Endoscopy;  Laterality: N/A;   HERNIA REPAIR     X2   IVC FILTER REMOVAL  N/A 07/03/2017   Procedure: IVC FILTER REMOVAL;  Surgeon: Annice Needy, MD;  Location: [facility] INVASIVE CV LAB;  Service: Cardiovascular;  Laterality: N/A;   LUMBAR LAMINECTOMY/DECOMPRESSION MICRODISCECTOMY N/A 04/20/2020   Procedure: L2/3, L3/4 LAMINECTOMY;  Surgeon: Lucy Chris, MD;  Location: [facility] ORS;  Service: Neurosurgery;  Laterality: N/A;  2ND CASE   PERIPHERAL VASCULAR THROMBECTOMY Right 05/22/2017   Procedure: PERIPHERAL VASCULAR THROMBECTOMY;  Surgeon: Annice Needy, MD;  Location: [facility] INVASIVE CV LAB;  Service: Cardiovascular;  Laterality: Right;   SPINE SURGERY  December 2021   TONSILLECTOMY     Patient Active Problem List   Diagnosis Date Noted   Insomnia due to medical condition 12/29/2022   Elevated low density lipoprotein (LDL) cholesterol level 12/29/2022   Generalized anxiety disorder 12/29/2022   GERD (gastroesophageal reflux disease) 12/29/2022   ARF (acute renal failure) (HCC) 12/29/2022   Stroke (cerebrum) (HCC) 12/15/2022   Acute thrombotic stroke (HCC) 12/14/2022   Acute delirium 12/14/2022   Overweight (BMI 25.0-29.9) 12/13/2022   Weakness 12/12/2022   Cognitive impairment 12/12/2022   SOB (shortness of breath) 06/02/2022   Acute respiratory failure with hypoxia (HCC) 05/25/2022   Multifocal pneumonia 05/24/2022   Falls 05/24/2022   Acute left-sided low back pain with left-sided sciatica 02/25/2020  Lumbar stenosis with neurogenic claudication 02/25/2020   Diverticulosis of colon with hemorrhage    Diverticulosis of small intestine without hemorrhage    HTN (hypertension) 08/30/2018   Deep vein thrombosis (DVT) of proximal vein of right lower extremity (HCC) 06/14/2017   ONSET DATE: 12/12/22  REFERRING DIAG: CVA  THERAPY DIAG:  Muscle weakness (generalized)  Other lack of coordination  Rationale for Evaluation and Treatment: Rehabilitation  SUBJECTIVE:  SUBJECTIVE STATEMENT:  Pt.'s wife brought in some of the Pt.'s wood working projects to show  the materials that he uses, and the intricate nature of his work.  Pt. Had a follow-up MD appointment last week.  Pt accompanied by: significant other  PERTINENT HISTORY: Clayton [name] is a 88 y.o. male with medical history significant of cognitive impairment, history of TBI, hypertension presenting with weakness, confusion. Admitted to [facility] 12/12/22 for worsening weakness and cognitive impairment. Per MRI impression: "Scattered small acute to subacute infarcts in the right PCA distribution". Completed inpatient rehab stay from 12/15/22 to 12/28/22.   PRECAUTIONS: Fall  WEIGHT BEARING RESTRICTIONS: No  PAIN:  Are you having pain? No  FALLS: Has patient fallen in last 6 months? Yes. 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  OBJECTIVE:   HAND DOMINANCE: Right  ADLs: Overall ADLs: MIN A + cues for initiation  Transfers/ambulation related to ADLs: SUPERVISION with RW Eating: SETUP Grooming: SETUP + cues for initiation  UB Dressing: SETUP LB Dressing: MIN A Toileting: cueing Bathing: MIN A  Tub Shower transfers: MOD A  Equipment: Shower seat with back  IADLs: Shopping: spouse completes Light housekeeping: spouse completes Meal Prep: spouse completes Community mobility: spouse completes Medication management: spouse completes Landscape architect: spouse completes Handwriting: 100% legible  MOBILITY STATUS: Needs Assist: RW  POSTURE COMMENTS:  No Significant postural limitations  ACTIVITY TOLERANCE: Activity tolerance: 5 min standing tolerance   FUNCTIONAL OUTCOME MEASURES: FOTO: 98  pt demonstrates poor insight into deficits   UPPER  EXTREMITY ROM:  WFL   UPPER EXTREMITY MMT:   WFL  HAND FUNCTION: Grip strength: Right: 60 lbs; Left: 42 lbs, Lateral pinch: Right: 24 lbs, Left: 22 lbs, and 3 point pinch: Right: 20 lbs, Left: 16 lbs  03/02/2023: Grip strength: Right: 72 lbs; Left: 47 lbs, Lateral pinch: Right: 27 lbs, Left: 22 lbs, and 3 point pinch: Right: 23 lbs, Left: 21 lbs  COORDINATION: 9 Hole Peg test: Right: 30 sec; Left: 38 sec  03/02/2023:9 Hole Peg test: Right: 26 sec; Left: 37 sec  SENSATION: WFL Not tested  EDEMA: none   COGNITION: Overall cognitive status: Impaired Completed clock draw with mildly impaired number spacing, unable to set correct times for minute/hand.  VISION: Subjective report:  Baseline vision: Wears glasses all the time Visual history:   VISION ASSESSMENT: To be further assessed in functional context  Patient has difficulty with following activities due to following visual impairments: L inattention Completed Albert's test with positive indicator for Unilateral Spatial Neglect - 10 lines on L lower/middle quadrant missed   OBSERVATIONS: Pt. presents with spouse for OT evaluation.    TODAY'S TREATMENT:  DATE: 03/02/2023  Measurements were obtained, and goals were reviewed with the Pt. and caregiver     PATIENT EDUCATION: Education details:left sided awareness, [facility] Person educated: Spouse Education method: Explanation Education comprehension: verbalized understanding  HOME EXERCISE PROGRAM:  Continue to assess ongoing need for HEPs.  GOALS: Goals reviewed with patient? Yes  SHORT TERM GOALS: Target date: 03/13/23  Pt and caregiver will IND complete HEP for improved grip strength and dexterity.  Baseline: 03/02/2023: Pt. requires supervision, verbal cues, and cues for initiation. Eval:no HEP Goal status:   LONG TERM GOALS: Target date:  04/24/23  Pt will increase L grip strength by 10# to open jars. Baseline: 03/02/2023: Left grip 47# Eval:L grip 42# Goal status: Progressed/Ongoing  2.  Pt will increase activity tolerance for standing dynamic tasks to 15 min to assist with IADLs Baseline: 03/02/2023: pt. Is now able to complete daily tasks in standing.  Eval: 5 min standing Goal status: Achieved  3.  Pt will IND don pants with no cues from caregiver for sequencing.  Baseline: 03/02/2023: Independent donning pants Eval: MIN A to don pants Goal status: Achieved  4.  Pt/caregiver will verbalize plan to implement x3 visual compensation strategies for improved I/ADL performance.  Baseline: 03/02/2023: Pt. requires cues. Eval: none Goal status: Ongoing  5.  Pt will improve non-dominant LUE dexterity by 10 seconds for return to leisure activity of wood carving. Baseline: 03/02/2023: Left : 37sec. Eval: 9 hole peg test 38 sec Goal status:Ongoing   ASSESSMENT:  CLINICAL IMPRESSION:  Pt. has made progress with left grip strength, pinch strength, and FMC skills. Pt. has met goals for donning pants, and for standing activity tolerance. Pt. continues to present with left sided visual impairments/left sided awareness, as well as cognitive/memory function which limits carry over of learned information from session to session. Pt. is now appropriate for discharge from OT services  at time. Pt/caregiver is in agreement.  PERFORMANCE DEFICITS: in functional skills including ADLs, IADLs, and dexterity, cognitive skills including attention, emotional, memory, orientation, problem solving, and safety awareness, and psychosocial skills including coping strategies and environmental adaptation.   IMPAIRMENTS: are limiting patient from ADLs, IADLs, and leisure.   CO-MORBIDITIES: has co-morbidities such as TBI  that affects occupational performance. Patient will benefit from skilled OT to address above impairments and improve overall  function.  MODIFICATION OR ASSISTANCE TO COMPLETE EVALUATION: Min-Moderate modification of tasks or assist with assess necessary to complete an evaluation.  OT OCCUPATIONAL PROFILE AND HISTORY: Detailed assessment: Review of records and additional review of physical, cognitive, psychosocial history related to current functional performance.  CLINICAL DECISION MAKING: Moderate - several treatment options, min-mod task modification necessary  REHAB POTENTIAL: Fair limited by cognition/STM  EVALUATION COMPLEXITY: Moderate    PLAN:  OT FREQUENCY: 1-2x/week  OT DURATION: 12 weeks  PLANNED INTERVENTIONS: self care/ADL training, therapeutic exercise, therapeutic activity, patient/family education, cognitive remediation/compensation, visual/perceptual remediation/compensation, energy conservation, and DME and/or AE instructions  RECOMMENDED OTHER SERVICES: SLP, PT  CONSULTED AND AGREED WITH PLAN OF CARE: Patient and family member/caregiver  PLAN FOR NEXT SESSION: initiate HEP  Olegario Messier, MS, OTR/L  03/02/2023, 10:32 AM

## 2023-03-03 ENCOUNTER — Inpatient Hospital Stay: Payer: Self-pay | Admitting: Neurology

## 2023-03-03 ENCOUNTER — Other Ambulatory Visit: Payer: Self-pay | Admitting: Registered Nurse

## 2023-03-07 ENCOUNTER — Ambulatory Visit: Payer: Medicare PPO | Attending: Physical Medicine & Rehabilitation

## 2023-03-07 ENCOUNTER — Ambulatory Visit: Payer: Medicare PPO | Admitting: Occupational Therapy

## 2023-03-07 DIAGNOSIS — I6389 Other cerebral infarction: Secondary | ICD-10-CM | POA: Diagnosis not present

## 2023-03-07 DIAGNOSIS — R414 Neurologic neglect syndrome: Secondary | ICD-10-CM | POA: Insufficient documentation

## 2023-03-07 DIAGNOSIS — M6281 Muscle weakness (generalized): Secondary | ICD-10-CM | POA: Diagnosis not present

## 2023-03-07 DIAGNOSIS — I63531 Cerebral infarction due to unspecified occlusion or stenosis of right posterior cerebral artery: Secondary | ICD-10-CM | POA: Insufficient documentation

## 2023-03-07 DIAGNOSIS — R278 Other lack of coordination: Secondary | ICD-10-CM | POA: Insufficient documentation

## 2023-03-07 DIAGNOSIS — R41841 Cognitive communication deficit: Secondary | ICD-10-CM | POA: Diagnosis not present

## 2023-03-07 DIAGNOSIS — R296 Repeated falls: Secondary | ICD-10-CM | POA: Insufficient documentation

## 2023-03-07 DIAGNOSIS — R262 Difficulty in walking, not elsewhere classified: Secondary | ICD-10-CM | POA: Diagnosis not present

## 2023-03-07 DIAGNOSIS — R2681 Unsteadiness on feet: Secondary | ICD-10-CM | POA: Diagnosis not present

## 2023-03-07 NOTE — Therapy (Addendum)
OUTPATIENT PHYSICAL THERAPY TREATMENT  Patient Name: Clayton [name] MRN: [id number] DOB:1934-05-10, 88, Male Today's Date: 02/21/2023  END OF SESSION:.  PT End of Session - 03/07/23 0810     Visit Number 9    Number of Visits 16    Date for PT Re-Evaluation 04/01/23    Authorization Type [employer] PPO    Authorization Time Period 01/30/23-04/01/23    Progress Note Due on Visit 10    PT Start Time 0812    PT Stop Time 0843    PT Time Calculation (min) 31 min    Equipment Utilized During Treatment Gait belt    Activity Tolerance Patient tolerated treatment well    Behavior During Therapy WFL for tasks assessed/performed             Past Medical History:  Diagnosis Date   Acute gastrointestinal hemorrhage     Acute hypoxemic respiratory failure (HCC) 05/25/2022   Acute thrombotic stroke (HCC) 12/14/2022   Basal cell carcinoma     Blood in stool 08/30/2018   Blood transfusion without reported diagnosis 88 (age 15)    After traumatic brain injury   Broken ribs      2 BROKEN ON RIBS   CKD (chronic kidney disease)      PT DENIES   Color blindness     DVT (deep venous thrombosis) (HCC)      RIGHT LEG   Dyslexia     Fracture dislocation of wrist      RIGHT   GI bleed 09/02/2018   Hiatal hernia with GERD     HTN (hypertension)     Hypokalemia 05/26/2022   Hyponatremia 05/25/2022   Symptomatic anemia 09/03/2018   TBI (traumatic brain injury) (HCC)      w/skull Fx and crani in 1960s                  Past Surgical History:  Procedure Laterality Date   COLONOSCOPY   2014    cleared for 5 yrs   COLONOSCOPY N/A 09/01/2018    Procedure: COLONOSCOPY;  Surgeon: Pasty Spillers, MD;  Location: [facility] ENDOSCOPY;  Service: Endoscopy;  Laterality: N/A;   CRANIECTOMY FOR DEPRESSED SKULL FRACTURE   1960'S   ESOPHAGOGASTRODUODENOSCOPY N/A 09/01/2018    Procedure: ESOPHAGOGASTRODUODENOSCOPY (EGD);  Surgeon: Pasty Spillers, MD;  Location: [facility]  ENDOSCOPY;  Service: Endoscopy;  Laterality: N/A;   HERNIA REPAIR        X2   IVC FILTER REMOVAL N/A 07/03/2017    Procedure: IVC FILTER REMOVAL;  Surgeon: Annice Needy, MD;  Location: [facility] INVASIVE CV LAB;  Service: Cardiovascular;  Laterality: N/A;   LUMBAR LAMINECTOMY/DECOMPRESSION MICRODISCECTOMY N/A 04/20/2020    Procedure: L2/3, L3/4 LAMINECTOMY;  Surgeon: Lucy Chris, MD;  Location: [facility] ORS;  Service: Neurosurgery;  Laterality: N/A;  2ND CASE   PERIPHERAL VASCULAR THROMBECTOMY Right 05/22/2017    Procedure: PERIPHERAL VASCULAR THROMBECTOMY;  Surgeon: Annice Needy, MD;  Location: [facility] INVASIVE CV LAB;  Service: Cardiovascular;  Laterality: Right;   SPINE SURGERY   December 2021   TONSILLECTOMY                 PCP: Tresa Garter MD  REFERRING PROVIDER: Wynn Banker MD  REFERRING DIAG: CVA  Rationale for Evaluation and Treatment: Rehabilitation  THERAPY DIAG:  No diagnosis found.  ONSET DATE: 12/12/22  SUBJECTIVE:  SUBJECTIVE STATEMENT: Pt reported feeling slightly fatigued today. Pt reported no falls since last visit. Pt reported 0/10 on NPS.    PERTINENT HISTORY:  Clayton [name] is a 88 y.o. male with medical history significant of cognitive impairment, history of TBI, hypertension presenting with weakness, confusion. Admitted to [facility] 12/12/22 for worsening weakness and cognitive impairment. Per MRI impression: "Scattered small acute to subacute infarcts in the right PCA distribution". Prior Level of Function : Independent/Modified Independent;Working/employed. Per wife: pt able to walk around without issue, Indepentdent without use of AD, however has a history of severla falls in past 2 years which she attributes to his bilat sensory neuropathy. He has been largely noncompliant with RW  recommendations since Feb 2024. Pt DC to inpatient rehab in [place], then home with HHPT.   PAIN:  Are you having pain? No  PRECAUTIONS: Fall   WEIGHT BEARING RESTRICTIONS: No  FALLS:  Has patient fallen in last 6 months? Yes. Number of falls: unsure  LIVING ENVIRONMENT: Lives with: Wife Stairs: Yes: 8 entry stairs with 1 railing  Has following equipment at home: Dan Humphreys - 2 wheeled  OCCUPATION: Radiographer, therapeutic" makes crafts and art   PLOF: Independent with basic ADLs  PATIENT GOALS: Wife desires improved safety and stamina in the home, falls avoidance   NEXT MD VISIT: unknown  OBJECTIVE:   PATIENT SURVEYS:  FOTO: not appropriate due to memory/awareness deficits   COGNITION: Overall cognitive status: memory/awareness deficits     SENSATION: Per wife: sensory neuropathy BLE   FUNCTIONAL TESTS:  -5xSTS: 14.76sec hands free, no LOB  - : 953ft (several LOB when turning), no device, needs cues for navigation due to memory deficits  -TUG: 9.88sec, no device, no LOB   Balance Assessment: -SLS: ~3 sec bilat (large amplitude righting, full body)  -eyes closed firm: 30sec, mild to no sway -eyes closed foam: ~20 sec (sway uncontrolled), heavy sway increase -Berg Balance Test: pending     TODAY'S TREATMENT:                                                                                                                              DATE: 03/07/23   THEREX:    Heel toe raises 20x Sit to stand 2x10 reps each without UE support   Seated lateral step over half foam 5# AW 2x10 each LE Seated BTB row 2x10  NMR: At support surface, gait belt donned  Standing: Lateral step up 6" step 5# AW x 15 rep each each LE  Ambulate 300 ft with 5lb ankle weights with CGA (Gait speed measured at 0.61 m/s)   Airex pad:  -eyes closed 30 seconds x 2 trials -tandem stance 2x30 seconds each LE set with UE support  PATIENT EDUCATION:  Education details: Pt educated throughout  session about proper posture and technique with exercises. Improved exercise technique, movement at target joints, use of target muscles after min to mod verbal, visual, tactile cues.  Person educated: Patient, wife  Education method: We spoke about it Education comprehension: Pt verbalized understanding of message, however anticipate need to repeat and emphasize message due to empirical evidence that pt's only recall ~70% of education from their provider.   HOME EXERCISE PROGRAM: Access Code: [email or web address] URL: [email or web address] Date: 02/02/2023 Prepared by: Maureen Ralphs  Exercises - Sit to Stand with Arms Crossed  - 3 x weekly - 3 sets - 10 reps - Standing Hip Abduction with Counter Support  - 3 x weekly - 3 sets - 10 reps - Standing Heel Raises  - 3 x weekly - 3 sets - 10 reps - 3 hold  ASSESSMENT:  CLINICAL IMPRESSION: Pt was able to tolerate all tasks during today's visit. Pt was able to increase ambulation distance with 5# AW by 150 feet compared to last visit. Therapeutic rest breaks were needed in between tasks, as patient reported fatigue throughout the visit. Tasks during today's visit were limited due to pt arriving late. Patient will benefit from continued skilled PT services to improve his overall functional LE strength and mobility to decrease fall risk and improve overall quality of life.   OBJECTIVE IMPAIRMENTS: Abnormal gait, cardiopulmonary status limiting activity, decreased activity tolerance, decreased balance, decreased coordination, decreased mobility, difficulty walking, decreased strength, impaired flexibility, impaired sensation, impaired vision/perception, improper body mechanics, and postural dysfunction.  ACTIVITY LIMITATIONS: carrying, lifting, bending, sitting, standing, squatting, stairs, transfers, bed mobility, dressing, reach overhead, hygiene/grooming, locomotion level, and caring for others  PARTICIPATION LIMITATIONS: meal prep,  cleaning, laundry, personal finances, driving, shopping, community activity, occupation, and yard work  PERSONAL FACTORS: Age, Fitness, Past/current experiences, Time since onset of injury/illness/exacerbation, and 3+ comorbidities REHAB POTENTIAL: Good  CLINICAL DECISION MAKING: Evolving/moderate complexity  EVALUATION COMPLEXITY: moderate  SHORT TERM GOALS: Target date: 03/01/23  Patient to report compliance and success with home based program as prescribed by this site in order to help with pt self-efficacy in outcomes and symptoms management.  Baseline: HEP to be issued at visit 2 Goal status: INITIAL    LONG TERM GOALS: Target date: 11/30  Pt to demonstrate improved power in legs, activity tolerance, and dynamic balance AEB improved 5xSTS and TUG.  Baseline: eval: 5xSTS hands free: 15+seconds; TUG c device in 9.86 sec Goal status: INITIAL  2.  Pt to demonstrate improved tolerance and distance in by >12% to indicate improved ability to perform tasks in community for IADL.   Baseline: eval: : 958ft, no device, several LOB with turning Goal status: INITIAL  3.  Pt to report more consistent use of 2-hand AD wen performing AMB outside of the house.  Baseline: intemrittent, inconsistent use of DME when AMB out of house.  Goal status: INITIAL  4. Pt will improve BERG by at least 3 points in order to demonstrate clinically significant improvement in balance.   Baseline: 02/02/2023= 42/56; 02/14/2023= 40/56  Goal Status: ONGOING PLAN:  PT FREQUENCY: 2x/week  PT DURATION: 2 months   PLANNED INTERVENTIONS: Therapeutic exercises, Therapeutic activity, Neuromuscular re-education, Balance training, Gait training, Patient/Family education, Self Care, and Joint mobilization.  PLAN FOR NEXT SESSION:  dual tasking, AMB with RW , progress therex and balance training as appropriate, endurance  Debara Pickett, SPT   This entire session was performed under direct supervision and  direction of a licensed Estate agent . I have personally read, edited and approve of the note as written.   2:35 PM, 03/07/23 Lenda Kelp PT   Physical Therapist - [place]  [facility]  Outpatient Physical Therapy- Main Campus [phone]   '

## 2023-03-09 ENCOUNTER — Ambulatory Visit: Payer: Medicare PPO | Admitting: Speech Pathology

## 2023-03-09 ENCOUNTER — Ambulatory Visit: Payer: Medicare PPO

## 2023-03-09 DIAGNOSIS — R414 Neurologic neglect syndrome: Secondary | ICD-10-CM | POA: Diagnosis not present

## 2023-03-09 DIAGNOSIS — R262 Difficulty in walking, not elsewhere classified: Secondary | ICD-10-CM | POA: Diagnosis not present

## 2023-03-09 DIAGNOSIS — I6389 Other cerebral infarction: Secondary | ICD-10-CM

## 2023-03-09 DIAGNOSIS — R296 Repeated falls: Secondary | ICD-10-CM | POA: Diagnosis not present

## 2023-03-09 DIAGNOSIS — I63531 Cerebral infarction due to unspecified occlusion or stenosis of right posterior cerebral artery: Secondary | ICD-10-CM | POA: Diagnosis not present

## 2023-03-09 DIAGNOSIS — R41841 Cognitive communication deficit: Secondary | ICD-10-CM

## 2023-03-09 DIAGNOSIS — R2681 Unsteadiness on feet: Secondary | ICD-10-CM | POA: Diagnosis not present

## 2023-03-09 DIAGNOSIS — R278 Other lack of coordination: Secondary | ICD-10-CM | POA: Diagnosis not present

## 2023-03-09 DIAGNOSIS — M6281 Muscle weakness (generalized): Secondary | ICD-10-CM

## 2023-03-09 NOTE — Therapy (Signed)
OUTPATIENT SPEECH LANGUAGE PATHOLOGY  TREATMENT NOTE   Patient Name: Clayton [name] MRN: [id number] DOB:11-16-1934, 88 y.o., male Today's Date: 03/09/2023  PCP: Leonor Liv, MD REFERRING PROVIDER: Claudette Laws, MD   End of Session - 03/09/23 1237     Visit Number 8    Number of Visits 25    Date for SLP Re-Evaluation 04/24/23    Authorization Type [employer] PPO    Authorization Time Period 01/30/2023 thru 03/31/2023    Authorization - Visit Number 8    Authorization - Number of Visits 18    Progress Note Due on Visit 10    SLP Start Time 1145    SLP Stop Time  1215    SLP Time Calculation (min) 30 min    Activity Tolerance Patient tolerated treatment well             No past medical history on file.  The histories are not reviewed yet. Please review them in the "History" navigator section and refresh this SmartLink. Patient Active Problem List   Diagnosis Date Noted   Insomnia due to medical condition 12/29/2022   Elevated low density lipoprotein (LDL) cholesterol level 12/29/2022   Generalized anxiety disorder 12/29/2022   GERD (gastroesophageal reflux disease) 12/29/2022   ARF (acute renal failure) (HCC) 12/29/2022   Stroke (cerebrum) (HCC) 12/15/2022   Acute thrombotic stroke (HCC) 12/14/2022   Acute delirium 12/14/2022   Overweight (BMI 25.0-29.9) 12/13/2022   Weakness 12/12/2022   Cognitive impairment 12/12/2022   SOB (shortness of breath) 06/02/2022   Acute respiratory failure with hypoxia (HCC) 05/25/2022   Multifocal pneumonia 05/24/2022   Falls 05/24/2022   Acute left-sided low back pain with left-sided sciatica 02/25/2020   Lumbar stenosis with neurogenic claudication 02/25/2020   Diverticulosis of colon with hemorrhage    Diverticulosis of small intestine without hemorrhage    HTN (hypertension) 08/30/2018   Deep vein thrombosis (DVT) of proximal vein of right lower extremity (HCC) 06/14/2017    ONSET DATE: pt's wife reports  gradual decline ~ 2 years ago, Cognitive Impairment initially dx on 12/12/2022; Right PCA CVA 12/12/2022; date of referral 01/16/2023   REFERRING DIAG: I63.531 (ICD-10-CM) - Acute right PCA stroke (HCC)  THERAPY DIAG:  Cognitive communication deficit  Cerebrovascular accident (CVA) due to other mechanism [facility])  Rationale for Evaluation and Treatment Rehabilitation  SUBJECTIVE:   PERTINENT HISTORY and DIAGNOSTIC FINDINGS: Pt is a 88 year old male with past medical history significant for TBI, hypertension, hyperlipidemia with multiple hospitalizations in January-February 2024 for pneumonia. While pt's wife reports gradual decrease in pt's vocal intensity and cognition, she reports singificant decline in memory and voice following this hospitalization (chart review reveals ambulatory referral made to neurology for concern regarding "mild cognitive impairment and dementia"). Pt seen by outpatient neurology on 06/28/2022 with MRI scheduled d/t concern with worsening memory loss. MRI 07/13/2022 - IMPRESSION: 1. No acute abnormality or reversible cause of memory loss. 2.  Cerebral atrophy. Pt with follow up appointment with Outpatient Neurology on 08/15/2022 with improved restlessness at night and mildly improved mood, ongiong unsteadiness while walking and unchanged memory deficits. Per chart, "discussed at length prognosis and safety concerns in setting of mild to moderate dementia." On 12/12/2022 pt presented to [facility] ED on 12/12/2022 with confusion. MRI confirmed (12/12/2022) FINDINGS: Small areas of restricted diffusion in the right occipital cortex, right hippocampus, and right thalamus. Other than the occipital cortex, diffusion restriction is fairly weak. Symmetric diffusion hyperintensity along the periphery of the  bilateral cerebellum is likely artifactual. Generalized cerebral volume loss. No acute hemorrhage, hydrocephalus, mass, or collection. Mild chronic small vessel ischemia in the cerebral  white matter. IMPRESSION: 1. Scattered small acute to subacute infarcts in the right PCA distribution 2. Generalized brain atrophy. [facility] until 12/28/2022. Pt with homehealth therapies in interm of Outpatient services. Follow up outpatient neurology on 01/17/2023 reveales worsening short and long term meory loss "memory evaluation today is 11/30."   This is in a largely unaided person with documented moderate bilateral sensory neural hearing loss (2015).   PAIN:  Are you having pain? No   FALLS: Has patient fallen in last 6 months?  See PT evaluation for details  LIVING ENVIRONMENT: Lives with: lives with their spouse Lives in: House/apartment  PLOF:  Level of assistance: Needed assistance with ADLs, Needed assistance with IADLS Employment: Retired   PATIENT GOALS   caregiver expresses desire to be able to facilitate recovery better  SUBJECTIVE STATEMENT: Pt pleasant, accompanied by his wife Pt accompanied by: significant other  OBJECTIVE:     TODAY'S TREATMENT:  Skilled treatment session focused on pt's cognitive communication goals. SLP facilitated session by providing the following interventions:    Verbal education completed on memory loss, progressive nature of loss in persons with history of TBI and stroke. Support provided for ways to help cue pt during moments of confusion. All questions from pt and his wife answered to their satisfaction.   PATIENT EDUCATION: Education details: See above Person educated: Patient and Spouse Education method: Explanation Education comprehension: needs further education  HOME EXERCISE PROGRAM:        Wear hearing aids Read  Establish a schedule  Complete ADLs/iADLs together  Look thru memory books/picture books    GOALS:  Goals reviewed with patient? Yes  SHORT TERM GOALS: Target date: 10 sessions  Pt will complete formal cognitive communication evaluation.  Baseline: Goal status: INITIAL:  MET   2.  Pt will refer to external memory aids for accurate recall of orientation information in 6 out of 7 opportunities.  Baseline: oriented to self and town Goal status: INITIAL: MET  3.  With Min A, pt will complete Expiratory Muscle Training (EMT) across 30 trials i at 50-70% of MEP improve coordination of respiration and phonation and maximize breath support for speech. Baseline:  Goal status: INITIAL: MET  4.  With Moderate assistance, pt will utilize speech intelligibility strategies to achieve > 80% speech intelligibility at the sentence level.  Baseline:  Goal status: INITIAL: MET   LONG TERM GOALS: Target date: 04/24/2023  With Min A, patient will use external aids to manage appointments, chores, shopping, and log vital signs.    Baseline:  Goal status: INITIAL: MET  2.  Pt will report improved cognitive communication via PROM by 5 points at last ST session   Baseline:  Goal status: INITIAL: MET  3.  Pt will complete HEP with moderate assistance across 5 out of 7 opportunities.  Baseline:  Goal status: INITIAL: MET   ASSESSMENT:  CLINICAL IMPRESSION:  Patient is a 88 y.o. male who was seen today for a cognitive communication evaluation following a right PCA CVA in the setting of pre-existing cognitive decline and TBI as well as chronically unaided moderate bilateral sensorineural hearing loss.    Pt and his wife have been eager to have education on dementia, memory loss, and ways to support pt safely within the home. At this time, all education has been completed  and all questions/recommendations made.    PLAN: Pt is appropriate for discharge from services.    Marna Weniger B. Dreama Saa, M.S., CCC-SLP, Tree surgeon Certified Brain Injury Specialist [facility]  [facility] Office [phone] Ascom [phone] Fax [phone]

## 2023-03-09 NOTE — Therapy (Addendum)
OUTPATIENT PHYSICAL THERAPY TREATMENT Physical Therapy Progress Note   Dates of reporting period  01/30/23   to   03/09/23   Patient Name: Clayton [name] MRN: [id number] DOB:01-Oct-1934, 55, Male Today's Date: 02/21/2023  END OF SESSION:.  PT End of Session - 03/09/23 1348     Visit Number 10    Number of Visits 16    Date for PT Re-Evaluation 04/01/23    PT Start Time 1100    PT Stop Time 1144    PT Time Calculation (min) 44 min    Equipment Utilized During Treatment Gait belt    Activity Tolerance Patient tolerated treatment well    Behavior During Therapy [facility] for tasks assessed/performed                   *SmartPhrase corrected 04/19/23     Past Medical History:  Diagnosis Date   Acute gastrointestinal hemorrhage     Acute hypoxemic respiratory failure (HCC) 05/25/2022   Acute thrombotic stroke (HCC) 12/14/2022   Basal cell carcinoma     Blood in stool 08/30/2018   Blood transfusion without reported diagnosis 54 (age 12)    After traumatic brain injury   Broken ribs      2 BROKEN ON RIBS   CKD (chronic kidney disease)      PT DENIES   Color blindness     DVT (deep venous thrombosis) (HCC)      RIGHT LEG   Dyslexia     Fracture dislocation of wrist      RIGHT   GI bleed 09/02/2018   Hiatal hernia with GERD     HTN (hypertension)     Hypokalemia 05/26/2022   Hyponatremia 05/25/2022   Symptomatic anemia 09/03/2018   TBI (traumatic brain injury) (HCC)      w/skull Fx and crani in 1960s          *SmartPhrase corrected 04/19/23      Past Surgical History:  Procedure Laterality Date   COLONOSCOPY   2014    cleared for 5 yrs   COLONOSCOPY N/A 09/01/2018    Procedure: COLONOSCOPY;  Surgeon: Pasty Spillers, MD;  Location: [facility] ENDOSCOPY;  Service: Endoscopy;  Laterality: N/A;   CRANIECTOMY FOR DEPRESSED SKULL FRACTURE   1960'S   ESOPHAGOGASTRODUODENOSCOPY N/A 09/01/2018    Procedure: ESOPHAGOGASTRODUODENOSCOPY (EGD);  Surgeon:  Pasty Spillers, MD;  Location: [facility] ENDOSCOPY;  Service: Endoscopy;  Laterality: N/A;   HERNIA REPAIR        X2   IVC FILTER REMOVAL N/A 07/03/2017    Procedure: IVC FILTER REMOVAL;  Surgeon: Annice Needy, MD;  Location: [facility] INVASIVE CV LAB;  Service: Cardiovascular;  Laterality: N/A;   LUMBAR LAMINECTOMY/DECOMPRESSION MICRODISCECTOMY N/A 04/20/2020    Procedure: L2/3, L3/4 LAMINECTOMY;  Surgeon: Lucy Chris, MD;  Location: [facility] ORS;  Service: Neurosurgery;  Laterality: N/A;  2ND CASE   PERIPHERAL VASCULAR THROMBECTOMY Right 05/22/2017    Procedure: PERIPHERAL VASCULAR THROMBECTOMY;  Surgeon: Annice Needy, MD;  Location: [facility] INVASIVE CV LAB;  Service: Cardiovascular;  Laterality: Right;   SPINE SURGERY   December 2021   TONSILLECTOMY                PCP: Tresa Garter MD  REFERRING PROVIDER: Wynn Banker MD  REFERRING DIAG: CVA  Rationale for Evaluation and Treatment: Rehabilitation  THERAPY DIAG:  No diagnosis found.  ONSET DATE: 12/12/22  SUBJECTIVE:  SUBJECTIVE STATEMENT: Pt reports he is without pain and has not had any changes to medications. He has not had any falls since his last visit. Pt's wife reports he has been "sundowning" more frequently than before.   PERTINENT HISTORY:  Clayton [name] is a 88 y.o. male with medical history significant of cognitive impairment, history of TBI, hypertension presenting with weakness, confusion. Admitted to [facility] 12/12/22 for worsening weakness and cognitive impairment. Per MRI impression: "Scattered small acute to subacute infarcts in the right PCA distribution". Prior Level of Function : Independent/Modified Independent;Working/employed. Per wife: pt able to walk around without issue, Indepentdent without use of AD, however has a history of severla falls  in past 2 years which she attributes to his bilat sensory neuropathy. He has been largely noncompliant with RW recommendations since Feb 2024. Pt DC to inpatient rehab in [place], then home with HHPT.   PAIN:  Are you having pain? No  PRECAUTIONS: Fall   WEIGHT BEARING RESTRICTIONS: No  FALLS:  Has patient fallen in last 6 months? Yes. Number of falls: unsure  LIVING ENVIRONMENT: Lives with: Wife Stairs: Yes: 8 entry stairs with 1 railing  Has following equipment at home: Dan Humphreys - 2 wheeled  OCCUPATION: Radiographer, therapeutic" makes crafts and art   PLOF: Independent with basic ADLs  PATIENT GOALS: Wife desires improved safety and stamina in the home, falls avoidance   NEXT MD VISIT: unknown  OBJECTIVE:   PATIENT SURVEYS:  FOTO: not appropriate due to memory/awareness deficits   COGNITION: Overall cognitive status: memory/awareness deficits     SENSATION: Per wife: sensory neuropathy BLE   FUNCTIONAL TESTS:  -5xSTS: 14.76sec hands free, no LOB  - : 936ft (several LOB when turning), no device, needs cues for navigation due to memory deficits  -TUG: 9.88sec, no device, no LOB   Balance Assessment: -SLS: ~3 sec bilat (large amplitude righting, full body)  -eyes closed firm: 30sec, mild to no sway -eyes closed foam: ~20 sec (sway uncontrolled), heavy sway increase -Berg Balance Test: pending     TODAY'S TREATMENT:                                                                                                                              DATE: 03/09/23  THEREX:  -2x10 STS with medicine ball overhead press  -Forward Step ups from airex pad with 4#aw 2x10 each LE -Standing Lateral Steps Over Hurdle 2x10 each LE, first couple of repetitions with 4#aw with increased difficulty due to pt's lack of balance, remaining reps without aw with small improvement in balance  NMR:  -Walking 150 ft in hallway with 4# ankle weights x2, second trial more difficult than first due to  fatigue, less foot clearance - Blaze pod circle activity- Pods set to random and arranged in circle with pt in the middle. If the pod lights up red, patient must tap it with his right lower extremity. If pod lights up green, patient must  tap it with his left lower extremity. Activity focusing on single leg balance, righting reactions, and adding in a cognitive challenge.   Standing with CGA next to support surface:  Airex pad: static stand 30 seconds  Airex pad: dual task with rearranging letters/numbers on white board x5 minutes   PATIENT EDUCATION:  Education details: Pt educated throughout session about proper posture and technique with exercises. Improved exercise technique, movement at target joints, use of target muscles after min to mod verbal, visual, tactile cues.  Person educated: Patient Education method: Demonstration, Explanation  Education comprehension: Pt verbalized understanding HOME EXERCISE PROGRAM: Access Code: [email or web address] URL: [email or web address] Date: 02/02/2023 Prepared by: Maureen Ralphs  Exercises - Sit to Stand with Arms Crossed  - 3 x weekly - 3 sets - 10 reps - Standing Hip Abduction with Counter Support  - 3 x weekly - 3 sets - 10 reps - Standing Heel Raises  - 3 x weekly - 3 sets - 10 reps - 3 hold  ASSESSMENT:  CLINICAL IMPRESSION: Patient presents to therapy accompanied by his wife and in good spirits. Today's treatment session focused on lower extremity strengthening and balance activities. He is mostly limited by his impaired balance evidenced by performance of activities such as step ups and lateral step overs. He requires frequent rest breaks throughout session and as fatigue increases he ambulates with less foot clearance. When challenged with dual tasking on the Airex pad, there was an increasing demand to maintain stability. Patient will continue to benefit from skilled physical therapy to maximize function, improve quality of life,  and decrease risk of falls. Goals to be performed next session. Patient's condition has the potential to improve in response to therapy. Maximum improvement is yet to be obtained. The anticipated improvement is attainable and reasonable in a generally predictable time.  OBJECTIVE IMPAIRMENTS: Abnormal gait, cardiopulmonary status limiting activity, decreased activity tolerance, decreased balance, decreased coordination, decreased mobility, difficulty walking, decreased strength, impaired flexibility, impaired sensation, impaired vision/perception, improper body mechanics, and postural dysfunction.  ACTIVITY LIMITATIONS: carrying, lifting, bending, sitting, standing, squatting, stairs, transfers, bed mobility, dressing, reach overhead, hygiene/grooming, locomotion level, and caring for others  PARTICIPATION LIMITATIONS: meal prep, cleaning, laundry, personal finances, driving, shopping, community activity, occupation, and yard work  PERSONAL FACTORS: Age, Fitness, Past/current experiences, Time since onset of injury/illness/exacerbation, and 3+ comorbidities REHAB POTENTIAL: Good  CLINICAL DECISION MAKING: Evolving/moderate complexity  EVALUATION COMPLEXITY: moderate  SHORT TERM GOALS: Target date: 03/01/23  Patient to report compliance and success with home based program as prescribed by this site in order to help with pt self-efficacy in outcomes and symptoms management.  Baseline: HEP to be issued at visit 2 Goal status: INITIAL    LONG TERM GOALS: Target date: 11/30  Pt to demonstrate improved power in legs, activity tolerance, and dynamic balance AEB improved 5xSTS and TUG.  Baseline: eval: 5xSTS hands free: 15+seconds; TUG c device in 9.86 sec Goal status: INITIAL  2.  Pt to demonstrate improved tolerance and distance in by >12% to indicate improved ability to perform tasks in community for IADL.   Baseline: eval: : 980ft, no device, several LOB with turning Goal status:  INITIAL  3.  Pt to report more consistent use of 2-hand AD wen performing AMB outside of the house.  Baseline: intemrittent, inconsistent use of DME when AMB out of house.  Goal status: INITIAL  4. Pt will improve BERG by at least 3 points in order to demonstrate clinically significant improvement in balance.  Baseline: 02/02/2023= 42/56; 02/14/2023= 40/56  Goal Status: ONGOING PLAN:  PT FREQUENCY: 2x/week  PT DURATION: 2 months   PLANNED INTERVENTIONS: Therapeutic exercises, Therapeutic activity, Neuromuscular re-education, Balance training, Gait training, Patient/Family education, Self Care, and Joint mobilization.  PLAN FOR NEXT SESSION:  goals, dual tasking, progress therex and balance training as appropriate, endurance  Randon Goldsmith, SPT   This entire session was performed under direct supervision and direction of a licensed therapist/therapist assistant . I have personally read, edited and approve of the note as written.   1:50 PM, 03/09/23 Precious Bard PT   Physical Therapist - [place] [facility]  Outpatient Physical Therapy- Main Campus [phone]   '

## 2023-03-13 NOTE — Therapy (Addendum)
OUTPATIENT PHYSICAL THERAPY TREATMENT  Patient Name: Clayton [name] MRN: [id number] DOB:May 30, 1934, 57, Male Today's Date: 02/21/2023  END OF SESSION:.  PT End of Session - 03/14/23 1238     Visit Number 11    Number of Visits 16    Date for PT Re-Evaluation 04/01/23    PT Start Time 1100    PT Stop Time 1144    PT Time Calculation (min) 44 min    Equipment Utilized During Treatment Gait belt    Activity Tolerance Patient tolerated treatment well    Behavior During Therapy [facility] for tasks assessed/performed              *SmartPhrase corrected 04/19/23     Past Medical History:  Diagnosis Date   Acute gastrointestinal hemorrhage     Acute hypoxemic respiratory failure (HCC) 05/25/2022   Acute thrombotic stroke (HCC) 12/14/2022   Basal cell carcinoma     Blood in stool 08/30/2018   Blood transfusion without reported diagnosis 55 (age 69)    After traumatic brain injury   Broken ribs      2 BROKEN ON RIBS   CKD (chronic kidney disease)      PT DENIES   Color blindness     DVT (deep venous thrombosis) (HCC)      RIGHT LEG   Dyslexia     Fracture dislocation of wrist      RIGHT   GI bleed 09/02/2018   Hiatal hernia with GERD     HTN (hypertension)     Hypokalemia 05/26/2022   Hyponatremia 05/25/2022   Symptomatic anemia 09/03/2018   TBI (traumatic brain injury) (HCC)      w/skull Fx and crani in 1960s          *SmartPhrase corrected 04/19/23      Past Surgical History:  Procedure Laterality Date   COLONOSCOPY   2014    cleared for 5 yrs   COLONOSCOPY N/A 09/01/2018    Procedure: COLONOSCOPY;  Surgeon: Clayton Spillers, MD;  Location: [facility] ENDOSCOPY;  Service: Endoscopy;  Laterality: N/A;   CRANIECTOMY FOR DEPRESSED SKULL FRACTURE   1960'S   ESOPHAGOGASTRODUODENOSCOPY N/A 09/01/2018    Procedure: ESOPHAGOGASTRODUODENOSCOPY (EGD);  Surgeon: Clayton Spillers, MD;  Location: [facility] ENDOSCOPY;  Service: Endoscopy;  Laterality: N/A;   HERNIA  REPAIR        X2   IVC FILTER REMOVAL N/A 07/03/2017    Procedure: IVC FILTER REMOVAL;  Surgeon: Clayton Needy, MD;  Location: [facility] INVASIVE CV LAB;  Service: Cardiovascular;  Laterality: N/A;   LUMBAR LAMINECTOMY/DECOMPRESSION MICRODISCECTOMY N/A 04/20/2020    Procedure: L2/3, L3/4 LAMINECTOMY;  Surgeon: Clayton Chris, MD;  Location: [facility] ORS;  Service: Neurosurgery;  Laterality: N/A;  2ND CASE   PERIPHERAL VASCULAR THROMBECTOMY Right 05/22/2017    Procedure: PERIPHERAL VASCULAR THROMBECTOMY;  Surgeon: Clayton Needy, MD;  Location: [facility] INVASIVE CV LAB;  Service: Cardiovascular;  Laterality: Right;   SPINE SURGERY   December 2021   TONSILLECTOMY                      Past Medical History:  Diagnosis Date   Arthritis    COPD (chronic obstructive pulmonary disease) (HCC)    Diabetes mellitus without complication (HCC)    Dyspnea    occ due to copd/emphysema on exertion   Emphysema lung (HCC)    Emphysema of lung (HCC)    GERD (gastroesophageal reflux disease)    Lung cancer (HCC) 2018  Lung/METS TO HIP-PT RECEIVING RADIATION TO RIGHT HIP    Pre-diabetes    Pulmonary embolism [facility])    Past Surgical History:  Procedure Laterality Date   back surgey  2013   COLONOSCOPY  2013   COLONOSCOPY WITH PROPOFOL N/A 03/15/2018   Procedure: COLONOSCOPY WITH PROPOFOL;  Surgeon: Clayton Reil, MD;  Location: [facility] ENDOSCOPY;  Service: Gastroenterology;  Laterality: N/A;   ENDOBRONCHIAL ULTRASOUND Right 02/17/2017   Procedure: ENDOBRONCHIAL ULTRASOUND;  Surgeon: Clayton Crutch, MD;  Location: [facility] ORS;  Service: Pulmonary;  Laterality: Right;   IR FLUORO GUIDE PORT INSERTION RIGHT  03/16/2017   IR IMAGING GUIDED PORT INSERTION  06/19/2020   IR REMOVAL TUN ACCESS W/ PORT W/O FL MOD SED  06/06/2018   ROTATOR CUFF REPAIR  2013   Patient Active Problem List   Diagnosis Date Noted   Rectal bleeding 12/11/2019   Pneumonia of both lungs due to infectious organism 12/11/2019   1st  degree AV block 12/05/2019   Aortic atherosclerosis (HCC) 12/03/2019   Lung cancer (HCC) 12/03/2019   Adrenal mass, right (HCC) 08/24/2019   Sepsis (HCC) 02/23/2019   Drug-induced polyneuropathy (HCC) 12/07/2018   Chronic systolic CHF (congestive heart failure) (HCC) 11/27/2018   Diabetes mellitus type 2, controlled, with complications (HCC) 11/27/2018   Emphysema of lung (HCC) 11/27/2018   History of COVID-19 11/22/2018   Acute respiratory failure with hypoxia (HCC) 11/22/2018   Squamous cell lung cancer (HCC) - stage 4 with mets to bone 11/22/2018   Type 2 diabetes mellitus without complication (HCC) 11/22/2018   Chronic anticoagulation - on Xarelto for hx of pulmonary embolism 11/22/2018   Adiposity 10/17/2017   Brash 10/17/2017   History of colon polyps 10/17/2017   Immunization, tetanus toxoid 10/17/2017   LBP (low back pain) 10/17/2017   GI bleed 09/26/2017   Pulmonary embolism without acute cor pulmonale (HCC) 05/18/2017   Goals of care, counseling/discussion 02/23/2017   Mild left ventricular systolic dysfunction 02/22/2017   SOB (shortness of breath) 02/15/2017   Malignant neoplasm of right upper lobe of lung (HCC) 01/24/2017   Cancer, metastatic to bone (HCC) 01/24/2017    PCP: Clayton Garter MD  REFERRING PROVIDER: Wynn Banker MD  REFERRING DIAG: CVA  Rationale for Evaluation and Treatment: Rehabilitation  THERAPY DIAG:  No diagnosis found.  ONSET DATE: 12/12/22  SUBJECTIVE:                                                                                                                                                                                           SUBJECTIVE STATEMENT: Pt reports he is in a good mood and has not had  any falls. There have been no changes to his medications.   PERTINENT HISTORY:  Clayton [name] is a 88 y.o. male with medical history significant of cognitive impairment, history of TBI, hypertension presenting with weakness, confusion. Admitted  to [facility] 12/12/22 for worsening weakness and cognitive impairment. Per MRI impression: "Scattered small acute to subacute infarcts in the right PCA distribution". Prior Level of Function : Independent/Modified Independent;Working/employed. Per wife: pt able to walk around without issue, Indepentdent without use of AD, however has a history of severla falls in past 2 years which she attributes to his bilat sensory neuropathy. He has been largely noncompliant with RW recommendations since Feb 2024. Pt DC to inpatient rehab in [place], then home with HHPT.   PAIN:  Are you having pain? No  PRECAUTIONS: Fall   WEIGHT BEARING RESTRICTIONS: No  FALLS:  Has patient fallen in last 6 months? Yes. Number of falls: unsure  LIVING ENVIRONMENT: Lives with: Wife Stairs: Yes: 8 entry stairs with 1 railing  Has following equipment at home: Dan Humphreys - 2 wheeled  OCCUPATION: Radiographer, therapeutic" makes crafts and art   PLOF: Independent with basic ADLs  PATIENT GOALS: Wife desires improved safety and stamina in the home, falls avoidance   NEXT MD VISIT: unknown  OBJECTIVE:   PATIENT SURVEYS:  FOTO: not appropriate due to memory/awareness deficits   COGNITION: Overall cognitive status: memory/awareness deficits     SENSATION: Per wife: sensory neuropathy BLE   FUNCTIONAL TESTS:  -5xSTS: 14.76sec hands free, no LOB  - : 967ft (several LOB when turning), no device, needs cues for navigation due to memory deficits  -TUG: 9.88sec, no device, no LOB   Balance Assessment: -SLS: ~3 sec bilat (large amplitude righting, full body)  -eyes closed firm: 30sec, mild to no sway -eyes closed foam: ~20 sec (sway uncontrolled), heavy sway increase -Berg Balance Test: pending     TODAY'S TREATMENT:                                                                                                                              DATE: 03/14/23 Goals reassessed in today's session.   TUG: 8.46s  5XSTS: 14.81s   Berg: 44, most difficulty with single leg balance and tandem  : 8.28s : 1080 ft, verbal cues for turns  FOTO: 92  PATIENT EDUCATION:  Education details: Pt educated throughout session about proper posture and technique with exercises. Improved exercise technique, movement at target joints, use of target muscles after min to mod verbal, visual, tactile cues.  Person educated: Patient Education method: Demonstration, Explanation  Education comprehension: Pt verbalized understanding HOME EXERCISE PROGRAM: Access Code: [email or web address] URL: [email or web address] Date: 02/02/2023 Prepared by: Maureen Ralphs  Exercises - Sit to Stand with Arms Crossed  - 3 x weekly - 3 sets - 10 reps - Standing Hip Abduction with Counter Support  - 3 x weekly - 3 sets - 10 reps - Standing Heel Raises  - 3 x  weekly - 3 sets - 10 reps - 3 hold  ASSESSMENT:  CLINICAL IMPRESSION: Patient presents to therapy in good spirits and progressing towards his goals. Today's treatment session focused on reassessing the patient's goals. He performed the 5xSTS test in under 15 seconds, demonstrating an improvement in lower extremity strength. He was able to reduce his TUG time demonstrating an improved gait/transfer ability. He has improved his static balance evidenced by his increased score on the [employer] test. He still reports he is not using an assistive device for community ambulation. He has progressed his cardiopulmonary endurance evidenced by increased walking distance on the six minute walk test with little to no reports of fatigue. He is still limited by his impaired balance mostly noted during dynamic activities such as walking, turning, and dual tasking.  Patient's condition has the potential to improve in response to therapy. Patient will continue to benefit from skilled physical therapy to maximize function, improve quality of life, and decrease risk of falls.   OBJECTIVE IMPAIRMENTS:  Abnormal gait, cardiopulmonary status limiting activity, decreased activity tolerance, decreased balance, decreased coordination, decreased mobility, difficulty walking, decreased strength, impaired flexibility, impaired sensation, impaired vision/perception, improper body mechanics, and postural dysfunction.  ACTIVITY LIMITATIONS: carrying, lifting, bending, sitting, standing, squatting, stairs, transfers, bed mobility, dressing, reach overhead, hygiene/grooming, locomotion level, and caring for others  PARTICIPATION LIMITATIONS: meal prep, cleaning, laundry, personal finances, driving, shopping, community activity, occupation, and yard work  PERSONAL FACTORS: Age, Fitness, Past/current experiences, Time since onset of injury/illness/exacerbation, and 3+ comorbidities REHAB POTENTIAL: Good  CLINICAL DECISION MAKING: Evolving/moderate complexity  EVALUATION COMPLEXITY: moderate  SHORT TERM GOALS: Target date: 03/01/23  Patient to report compliance and success with home based program as prescribed by this site in order to help with pt self-efficacy in outcomes and symptoms management.  Baseline: HEP to be issued at next visit  Goal status: IN PROGRESS    LONG TERM GOALS: Target date: 11/30  Pt to demonstrate improved power in legs, activity tolerance, and dynamic balance AEB improved 5xSTS and TUG.  Baseline: eval: 5xSTS hands free: 15+seconds; TUG c device in 9.86 sec 03/14/2023: 5xSTS: 14.81s TUG: 8.28s Goal status: MET  2.  Pt to demonstrate improved tolerance and distance in by >12% to indicate improved ability to perform tasks in community for IADL.   Baseline: eval: : 944ft, no device, several LOB with turning 03/14/2023: 1080 ft, no device Goal status: MET  3.  Pt to report more consistent use of 2-hand AD wen performing AMB outside of the house.  Baseline: intermittent, inconsistent use of DME when AMB out of house. 03/14/2023: not addressed this session  Goal status: IN  PROGRESS  4. Pt will improve BERG by at least 3 points in order to demonstrate clinically significant improvement in balance.   Baseline: 02/02/2023= 42/56; 02/14/2023: 40/56 03/14/2023: 44  Goal Status: IN PROGRESS PLAN:  PT FREQUENCY: 2x/week  PT DURATION: 2 months   PLANNED INTERVENTIONS: Therapeutic exercises, Therapeutic activity, Neuromuscular re-education, Balance training, Gait training, Patient/Family education, Self Care, and Joint mobilization.  PLAN FOR NEXT SESSION:  provide HEP, dual tasking balance activities, progress therex and balance training as appropriate, endurance   This entire session was performed under direct supervision and direction of a licensed therapist/therapist assistant . I have personally read, edited and approve of the note as written.    Clayton [name], Student- PT  1:22 PM, 03/14/23 Precious Bard PT Physical Therapist - [place] [facility]  Outpatient Physical  [facility] [phone]   '

## 2023-03-14 ENCOUNTER — Ambulatory Visit: Payer: Medicare PPO

## 2023-03-14 ENCOUNTER — Ambulatory Visit: Payer: Medicare PPO | Admitting: Occupational Therapy

## 2023-03-14 DIAGNOSIS — I63531 Cerebral infarction due to unspecified occlusion or stenosis of right posterior cerebral artery: Secondary | ICD-10-CM | POA: Diagnosis not present

## 2023-03-14 DIAGNOSIS — R414 Neurologic neglect syndrome: Secondary | ICD-10-CM | POA: Diagnosis not present

## 2023-03-14 DIAGNOSIS — M6281 Muscle weakness (generalized): Secondary | ICD-10-CM

## 2023-03-14 DIAGNOSIS — R278 Other lack of coordination: Secondary | ICD-10-CM | POA: Diagnosis not present

## 2023-03-14 DIAGNOSIS — R262 Difficulty in walking, not elsewhere classified: Secondary | ICD-10-CM

## 2023-03-14 DIAGNOSIS — R2681 Unsteadiness on feet: Secondary | ICD-10-CM | POA: Diagnosis not present

## 2023-03-14 DIAGNOSIS — I6389 Other cerebral infarction: Secondary | ICD-10-CM | POA: Diagnosis not present

## 2023-03-14 DIAGNOSIS — R41841 Cognitive communication deficit: Secondary | ICD-10-CM | POA: Diagnosis not present

## 2023-03-14 DIAGNOSIS — R296 Repeated falls: Secondary | ICD-10-CM | POA: Diagnosis not present

## 2023-03-15 NOTE — Therapy (Addendum)
OUTPATIENT PHYSICAL THERAPY TREATMENT  Patient Name: Clayton [name] MRN: [id number] DOB:July 29, 1934, 72, Male Today's Date: 02/21/2023  END OF SESSION:.  PT End of Session - 03/16/23 1152     Visit Number 12    Number of Visits 16    Date for PT Re-Evaluation 04/01/23    PT Start Time 1100    PT Stop Time 1144    PT Time Calculation (min) 44 min    Equipment Utilized During Treatment Gait belt    Activity Tolerance Patient tolerated treatment well    Behavior During Therapy [facility] for tasks assessed/performed               *SmartPhrase corrected 04/19/23     Past Medical History:  Diagnosis Date   Acute gastrointestinal hemorrhage     Acute hypoxemic respiratory failure (HCC) 05/25/2022   Acute thrombotic stroke (HCC) 12/14/2022   Basal cell carcinoma     Blood in stool 08/30/2018   Blood transfusion without reported diagnosis 51 (age 85)    After traumatic brain injury   Broken ribs      2 BROKEN ON RIBS   CKD (chronic kidney disease)      PT DENIES   Color blindness     DVT (deep venous thrombosis) (HCC)      RIGHT LEG   Dyslexia     Fracture dislocation of wrist      RIGHT   GI bleed 09/02/2018   Hiatal hernia with GERD     HTN (hypertension)     Hypokalemia 05/26/2022   Hyponatremia 05/25/2022   Symptomatic anemia 09/03/2018   TBI (traumatic brain injury) (HCC)      w/skull Fx and crani in 1960s          *SmartPhrase corrected 04/19/23      Past Surgical History:  Procedure Laterality Date   COLONOSCOPY   2014    cleared for 5 yrs   COLONOSCOPY N/A 09/01/2018    Procedure: COLONOSCOPY;  Surgeon: Pasty Spillers, MD;  Location: [facility] ENDOSCOPY;  Service: Endoscopy;  Laterality: N/A;   CRANIECTOMY FOR DEPRESSED SKULL FRACTURE   1960'S   ESOPHAGOGASTRODUODENOSCOPY N/A 09/01/2018    Procedure: ESOPHAGOGASTRODUODENOSCOPY (EGD);  Surgeon: Pasty Spillers, MD;  Location: [facility] ENDOSCOPY;  Service: Endoscopy;  Laterality: N/A;   HERNIA  REPAIR        X2   IVC FILTER REMOVAL N/A 07/03/2017    Procedure: IVC FILTER REMOVAL;  Surgeon: Annice Needy, MD;  Location: [facility] INVASIVE CV LAB;  Service: Cardiovascular;  Laterality: N/A;   LUMBAR LAMINECTOMY/DECOMPRESSION MICRODISCECTOMY N/A 04/20/2020    Procedure: L2/3, L3/4 LAMINECTOMY;  Surgeon: Lucy Chris, MD;  Location: [facility] ORS;  Service: Neurosurgery;  Laterality: N/A;  2ND CASE   PERIPHERAL VASCULAR THROMBECTOMY Right 05/22/2017    Procedure: PERIPHERAL VASCULAR THROMBECTOMY;  Surgeon: Annice Needy, MD;  Location: [facility] INVASIVE CV LAB;  Service: Cardiovascular;  Laterality: Right;   SPINE SURGERY   December 2021   TONSILLECTOMY                   PCP: Tresa Garter MD  REFERRING PROVIDER: Wynn Banker MD  REFERRING DIAG: CVA  Rationale for Evaluation and Treatment: Rehabilitation  THERAPY DIAG:  No diagnosis found.  ONSET DATE: 12/12/22  SUBJECTIVE:  SUBJECTIVE STATEMENT: Pt reports he is in a good mood and has not had any falls. There have been no changes to his medications.   PERTINENT HISTORY:  Clayton [name] is a 88 y.o. male with medical history significant of cognitive impairment, history of TBI, hypertension presenting with weakness, confusion. Admitted to [facility] 12/12/22 for worsening weakness and cognitive impairment. Per MRI impression: "Scattered small acute to subacute infarcts in the right PCA distribution". Prior Level of Function : Independent/Modified Independent;Working/employed. Per wife: pt able to walk around without issue, Indepentdent without use of AD, however has a history of severla falls in past 2 years which she attributes to his bilat sensory neuropathy. He has been largely noncompliant with RW recommendations since Feb 2024. Pt DC to inpatient rehab in [place], then  home with HHPT.   PAIN:  Are you having pain? No  PRECAUTIONS: Fall   WEIGHT BEARING RESTRICTIONS: No  FALLS:  Has patient fallen in last 6 months? Yes. Number of falls: unsure  LIVING ENVIRONMENT: Lives with: Wife Stairs: Yes: 8 entry stairs with 1 railing  Has following equipment at home: Dan Humphreys - 2 wheeled  OCCUPATION: Radiographer, therapeutic" makes crafts and art   PLOF: Independent with basic ADLs  PATIENT GOALS: Wife desires improved safety and stamina in the home, falls avoidance   NEXT MD VISIT: unknown  OBJECTIVE:   PATIENT SURVEYS:  FOTO: not appropriate due to memory/awareness deficits   COGNITION: Overall cognitive status: memory/awareness deficits     SENSATION: Per wife: sensory neuropathy BLE   FUNCTIONAL TESTS:  -5xSTS: 14.76sec hands free, no LOB  - : 942ft (several LOB when turning), no device, needs cues for navigation due to memory deficits  -TUG: 9.88sec, no device, no LOB   Balance Assessment: -SLS: ~3 sec bilat (large amplitude righting, full body)  -eyes closed firm: 30sec, mild to no sway -eyes closed foam: ~20 sec (sway uncontrolled), heavy sway increase -Berg Balance Test: pending     TODAY'S TREATMENT:                                                                                                                              DATE: 03/16/23 Therex:  - Forward Step ups from [employer] onto 6 inch step 2x10 each LE  - Lateral step ups with 6 inch step in the middle and Airex on each side 3x10 each LE, last two sets performed with 4#aw, using UE support   NMR:  -Hedge Hog Activity: Three hedge hogs placed in a horizontal line in front of patient. Patient uses his LE to perform a foot tap on each hedge hog (single leg balance), 3x10 each LE, two trials with fingertip support, one trial without UE support, challenge to maintain stability, increased difficulty when standing on RLE and tapping with LLE -Resisted gait with 4#aw 150 ft x3, cues to  increase foot clearance and step length -Airex pad:  2x10, noticeable trembling of ankles/LE's with fatigue and  challenge to maintain stability, fingertip support  -Airex pad: one foot on 6" step one foot on airex pad, hold position for 30 seconds, switch legs, 2x each LE; second set with dual task of catching a ball x60s each LE -Airex pad: eyes closed x30s -Weaving between cones- focusing on narrowing base of support and maintaining balance    PATIENT EDUCATION:  Education details: Pt educated throughout session about proper posture and technique with exercises. Improved exercise technique, movement at target joints, use of target muscles after min to mod verbal, visual, tactile cues.  Person educated: Patient Education method: Demonstration, Explanation  Education comprehension: Pt verbalized understanding HOME EXERCISE PROGRAM: Access Code: [email or web address] URL: [email or web address] Date: 02/02/2023 Prepared by: Maureen Ralphs  Exercises - Sit to Stand with Arms Crossed  - 3 x weekly - 3 sets - 10 reps - Standing Hip Abduction with Counter Support  - 3 x weekly - 3 sets - 10 reps - Standing Heel Raises  - 3 x weekly - 3 sets - 10 reps - 3 hold  ASSESSMENT:  CLINICAL IMPRESSION: Patient presents to therapy in good spirits and making progress towards his goals. He is still challenged with single leg balance activities such as foot taps and marches on the [employer] pad. He was able to increase his foot clearance and step length with verbal cues, but there is little carry over. He reports he can improve his compliance in his home exercise plan and another handout was provided for reinforcement. Patient will continue to benefit from skilled physical therapy to maximize function, improve quality of life, and decrease risk of falls.   OBJECTIVE IMPAIRMENTS: Abnormal gait, cardiopulmonary status limiting activity, decreased activity tolerance, decreased balance, decreased  coordination, decreased mobility, difficulty walking, decreased strength, impaired flexibility, impaired sensation, impaired vision/perception, improper body mechanics, and postural dysfunction.  ACTIVITY LIMITATIONS: carrying, lifting, bending, sitting, standing, squatting, stairs, transfers, bed mobility, dressing, reach overhead, hygiene/grooming, locomotion level, and caring for others  PARTICIPATION LIMITATIONS: meal prep, cleaning, laundry, personal finances, driving, shopping, community activity, occupation, and yard work  PERSONAL FACTORS: Age, Fitness, Past/current experiences, Time since onset of injury/illness/exacerbation, and 3+ comorbidities REHAB POTENTIAL: Good  CLINICAL DECISION MAKING: Evolving/moderate complexity  EVALUATION COMPLEXITY: moderate  SHORT TERM GOALS: Target date: 03/01/23  Patient to report compliance and success with home based program as prescribed by this site in order to help with pt self-efficacy in outcomes and symptoms management.  Baseline: HEP to be issued at next visit  Goal status: IN PROGRESS    LONG TERM GOALS: Target date: 11/30  Pt to demonstrate improved power in legs, activity tolerance, and dynamic balance AEB improved 5xSTS and TUG.  Baseline: eval: 5xSTS hands free: 15+seconds; TUG c device in 9.86 sec 03/14/2023: 5xSTS: 14.81s TUG: 8.28s Goal status: MET  2.  Pt to demonstrate improved tolerance and distance in by >12% to indicate improved ability to perform tasks in community for IADL.   Baseline: eval: : 939ft, no device, several LOB with turning 03/14/2023: 1080 ft, no device Goal status: MET  3.  Pt to report more consistent use of 2-hand AD wen performing AMB outside of the house.  Baseline: intermittent, inconsistent use of DME when AMB out of house. 03/14/2023: not addressed this session  Goal status: IN PROGRESS  4. Pt will improve BERG by at least 3 points in order to demonstrate clinically significant improvement  in balance.   Baseline: 02/02/2023= 42/56; 02/14/2023: 40/56 03/14/2023: 44  Goal Status: IN PROGRESS PLAN:  PT FREQUENCY: 2x/week  PT DURATION: 2 months   PLANNED INTERVENTIONS: Therapeutic exercises, Therapeutic activity, Neuromuscular re-education, Balance training, Gait training, Patient/Family education, Self Care, and Joint mobilization.  PLAN FOR NEXT SESSION: dual tasking balance activities, progress LE strengthening and balance training as appropriate   This entire session was performed under direct supervision and direction of a licensed therapist/therapist assistant . I have personally read, edited and approve of the note as written.    Randon Goldsmith, Student- PT  This entire session was performed under direct supervision and direction of a licensed therapist/therapist assistant . I have personally read, edited and approve of the note as written.   12:27 PM, 03/16/23 Precious Bard PT Physical Therapist - [place] [facility]  Outpatient Physical Therapy- [facility] [phone]   '

## 2023-03-16 ENCOUNTER — Ambulatory Visit: Payer: Medicare PPO

## 2023-03-16 ENCOUNTER — Ambulatory Visit: Payer: Medicare PPO | Admitting: Speech Pathology

## 2023-03-16 ENCOUNTER — Encounter: Payer: Medicare PPO | Admitting: Occupational Therapy

## 2023-03-16 DIAGNOSIS — R2681 Unsteadiness on feet: Secondary | ICD-10-CM

## 2023-03-16 DIAGNOSIS — R296 Repeated falls: Secondary | ICD-10-CM | POA: Diagnosis not present

## 2023-03-16 DIAGNOSIS — I63531 Cerebral infarction due to unspecified occlusion or stenosis of right posterior cerebral artery: Secondary | ICD-10-CM | POA: Diagnosis not present

## 2023-03-16 DIAGNOSIS — R262 Difficulty in walking, not elsewhere classified: Secondary | ICD-10-CM

## 2023-03-16 DIAGNOSIS — R414 Neurologic neglect syndrome: Secondary | ICD-10-CM | POA: Diagnosis not present

## 2023-03-16 DIAGNOSIS — M6281 Muscle weakness (generalized): Secondary | ICD-10-CM

## 2023-03-16 DIAGNOSIS — R278 Other lack of coordination: Secondary | ICD-10-CM | POA: Diagnosis not present

## 2023-03-16 DIAGNOSIS — I6389 Other cerebral infarction: Secondary | ICD-10-CM | POA: Diagnosis not present

## 2023-03-16 DIAGNOSIS — R41841 Cognitive communication deficit: Secondary | ICD-10-CM | POA: Diagnosis not present

## 2023-03-20 NOTE — Therapy (Addendum)
OUTPATIENT PHYSICAL THERAPY TREATMENT  Patient Name: Clayton [name]. Posten MRN: [id number] DOB:[birth year], 70, Male Today's Date: 02/21/2023  END OF SESSION:.  PT End of Session - 03/21/23 1147     Visit Number 13    Number of Visits 16    Date for PT Re-Evaluation 04/01/23    PT Start Time 1100    PT Stop Time 1144    PT Time Calculation (min) 44 min    Equipment Utilized During Treatment Gait belt    Activity Tolerance Patient tolerated treatment well    Behavior During Therapy [facility] for tasks assessed/performed              *SmartPhrase corrected 04/19/23     Past Medical History:  Diagnosis Date   Acute gastrointestinal hemorrhage     Acute hypoxemic respiratory failure (HCC) 05/25/2022   Acute thrombotic stroke (HCC) 12/14/2022   Basal cell carcinoma     Blood in stool 08/30/2018   Blood transfusion without reported diagnosis 86 (age 42)    After traumatic brain injury   Broken ribs      2 BROKEN ON RIBS   CKD (chronic kidney disease)      PT DENIES   Color blindness     DVT (deep venous thrombosis) (HCC)      RIGHT LEG   Dyslexia     Fracture dislocation of wrist      RIGHT   GI bleed 09/02/2018   Hiatal hernia with GERD     HTN (hypertension)     Hypokalemia 05/26/2022   Hyponatremia 05/25/2022   Symptomatic anemia 09/03/2018   TBI (traumatic brain injury) (HCC)      w/skull Fx and crani in 1960s          *SmartPhrase corrected 04/19/23      Past Surgical History:  Procedure Laterality Date   COLONOSCOPY   2014    cleared for 5 yrs   COLONOSCOPY N/A 09/01/2018    Procedure: COLONOSCOPY;  Surgeon: Pasty Spillers, MD;  Location: [facility] ENDOSCOPY;  Service: Endoscopy;  Laterality: N/A;   CRANIECTOMY FOR DEPRESSED SKULL FRACTURE   1960'S   ESOPHAGOGASTRODUODENOSCOPY N/A 09/01/2018    Procedure: ESOPHAGOGASTRODUODENOSCOPY (EGD);  Surgeon: Pasty Spillers, MD;  Location: [facility] ENDOSCOPY;  Service: Endoscopy;  Laterality: N/A;   HERNIA  REPAIR        X2   IVC FILTER REMOVAL N/A 07/03/2017    Procedure: IVC FILTER REMOVAL;  Surgeon: Annice Needy, MD;  Location: [facility] INVASIVE CV LAB;  Service: Cardiovascular;  Laterality: N/A;   LUMBAR LAMINECTOMY/DECOMPRESSION MICRODISCECTOMY N/A 04/20/2020    Procedure: L2/3, L3/4 LAMINECTOMY;  Surgeon: Lucy Chris, MD;  Location: [facility] ORS;  Service: Neurosurgery;  Laterality: N/A;  2ND CASE   PERIPHERAL VASCULAR THROMBECTOMY Right 05/22/2017    Procedure: PERIPHERAL VASCULAR THROMBECTOMY;  Surgeon: Annice Needy, MD;  Location: [facility] INVASIVE CV LAB;  Service: Cardiovascular;  Laterality: Right;   SPINE SURGERY   December 2021   TONSILLECTOMY                  PCP: Tresa Garter MD  REFERRING PROVIDER: Wynn Banker MD  REFERRING DIAG: CVA  Rationale for Evaluation and Treatment: Rehabilitation  THERAPY DIAG:  No diagnosis found.  ONSET DATE: 12/12/22  SUBJECTIVE:  SUBJECTIVE STATEMENT: Pt reports he is in a good mood and has not had any falls. There have been no changes to his medications. Pt has been working on his crafts and artwork in his free time. He reports he has been doing some of the exercises for his home exercise program.   PERTINENT HISTORY:  Clayton [name] is a 88 y.o. male with medical history significant of cognitive impairment, history of TBI, hypertension presenting with weakness, confusion. Admitted to [facility] 12/12/22 for worsening weakness and cognitive impairment. Per MRI impression: "Scattered small acute to subacute infarcts in the right PCA distribution". Prior Level of Function : Independent/Modified Independent;Working/employed. Per wife: pt able to walk around without issue, Indepentdent without use of AD, however has a history of severla falls in past 2 years which she attributes to  his bilat sensory neuropathy. He has been largely noncompliant with RW recommendations since Feb 2024. Pt DC to inpatient rehab in [place], then home with HHPT.   PAIN:  Are you having pain? No  PRECAUTIONS: Fall   WEIGHT BEARING RESTRICTIONS: No  FALLS:  Has patient fallen in last 6 months? Yes. Number of falls: unsure  LIVING ENVIRONMENT: Lives with: Wife Stairs: Yes: 8 entry stairs with 1 railing  Has following equipment at home: Dan Humphreys - 2 wheeled  OCCUPATION: Radiographer, therapeutic" makes crafts and art   PLOF: Independent with basic ADLs  PATIENT GOALS: Wife desires improved safety and stamina in the home, falls avoidance   NEXT MD VISIT: unknown  OBJECTIVE:   PATIENT SURVEYS:  FOTO: not appropriate due to memory/awareness deficits   COGNITION: Overall cognitive status: memory/awareness deficits     SENSATION: Per wife: sensory neuropathy BLE   FUNCTIONAL TESTS:  -5xSTS: 14.76sec hands free, no LOB  - : 987ft (several LOB when turning), no device, needs cues for navigation due to memory deficits  -TUG: 9.88sec, no device, no LOB   Balance Assessment: -SLS: ~3 sec bilat (large amplitude righting, full body)  -eyes closed firm: 30sec, mild to no sway -eyes closed foam: ~20 sec (sway uncontrolled), heavy sway increase -Berg Balance Test: pending     TODAY'S TREATMENT:                                                                                                                              DATE: 03/21/23 Therex:  - STS with 4#aw and blue medicine ball  - Lateral step ups onto 6 inch step with 4#aw  -Foot taps form airex onto 6 unch step with 4#aw -Resistive gait 150 ft with 4#aw, cues to increase foot clearance throughout   Exercises listed above performed twice in a circuit style, second round of STS performed without medicine ball   NMR:  Single leg cone activity: Three cones placed in a horizontal line in front of patient. Patient uses his LE to perform a  foot tap on each cone (single leg balance and hip flexion), 3x10 each LE, two  trials with fingertip support, one trial without UE support, challenge to maintain stability, increased difficulty when standing on RLE and tapping with LLE -Agility ladder: Patient taking side steps followed by forward steps in ladder. Focusing on changing direction, coordination, and balance. Pt challenged with remembering pattern, but as reps increased he was able to carry out with little to no cues. X6 minutes  -Airex lateral beam x8, first four repetitions with full UE support, last four repetitions with fingertip UE support -Gait with balance components including horizontal head turns while reading out stickies on the wall and vertical head turns x5 minutes, patient with decreased cadence throughout in order to maintain balance    PATIENT EDUCATION:  Education details: Pt educated throughout session about proper posture and technique with exercises. Improved exercise technique, movement at target joints, use of target muscles after min to mod verbal, visual, tactile cues.  Person educated: Patient Education method: Demonstration, Explanation  Education comprehension: Pt verbalized understanding HOME EXERCISE PROGRAM: Access Code: [email or web address] URL: [email or web address] Date: 02/02/2023 Prepared by: Maureen Ralphs  Exercises - Sit to Stand with Arms Crossed  - 3 x weekly - 3 sets - 10 reps - Standing Hip Abduction with Counter Support  - 3 x weekly - 3 sets - 10 reps - Standing Heel Raises  - 3 x weekly - 3 sets - 10 reps - 3 hold  ASSESSMENT:  CLINICAL IMPRESSION: Patient presents to therapy in good spirits and making progress towards his goals. He reports he has been more compliant with his home exercise program since last visit.  He was able to perform lateral steps without UE support which is an improvement from last session. He was able to maintain single leg balance  for a longer period of  time indicating carry over from sessions. He reports little fatigue throughout the session with his highest RPE at 4/10. When performing dynamic balance activities, he ambulates with decreased cadence in order to maintain stability. Patient will continue to benefit from skilled physical therapy to maximize function, improve quality of life, and decrease risk of falls.   OBJECTIVE IMPAIRMENTS: Abnormal gait, cardiopulmonary status limiting activity, decreased activity tolerance, decreased balance, decreased coordination, decreased mobility, difficulty walking, decreased strength, impaired flexibility, impaired sensation, impaired vision/perception, improper body mechanics, and postural dysfunction.  ACTIVITY LIMITATIONS: carrying, lifting, bending, sitting, standing, squatting, stairs, transfers, bed mobility, dressing, reach overhead, hygiene/grooming, locomotion level, and caring for others  PARTICIPATION LIMITATIONS: meal prep, cleaning, laundry, personal finances, driving, shopping, community activity, occupation, and yard work  PERSONAL FACTORS: Age, Fitness, Past/current experiences, Time since onset of injury/illness/exacerbation, and 3+ comorbidities REHAB POTENTIAL: Good  CLINICAL DECISION MAKING: Evolving/moderate complexity  EVALUATION COMPLEXITY: moderate  SHORT TERM GOALS: Target date: 03/01/23  Patient to report compliance and success with home based program as prescribed by this site in order to help with pt self-efficacy in outcomes and symptoms management.  Baseline: HEP to be issued at next visit  Goal status: IN PROGRESS    LONG TERM GOALS: Target date: 11/30  Pt to demonstrate improved power in legs, activity tolerance, and dynamic balance AEB improved 5xSTS and TUG.  Baseline: eval: 5xSTS hands free: 15+seconds; TUG c device in 9.86 sec 03/14/2023: 5xSTS: 14.81s TUG: 8.28s Goal status: MET   2.  Pt to demonstrate improved tolerance and distance in by >12% to indicate  improved ability to perform tasks in community for IADL.   Baseline: eval: : 964ft, no device, several LOB with turning 03/14/2023: 1080 ft, no  device Goal status: MET  3.  Pt to report more consistent use of 2-hand AD wen performing AMB outside of the house.  Baseline: intermittent, inconsistent use of DME when AMB out of house. 03/14/2023: not addressed this session  Goal status: IN PROGRESS  4. Pt will improve BERG by at least 3 points in order to demonstrate clinically significant improvement in balance.   Baseline: 02/02/2023= 42/56; 02/14/2023: 40/56 03/14/2023: 44  Goal Status: IN PROGRESS PLAN:  PT FREQUENCY: 2x/week  PT DURATION: 2 months   PLANNED INTERVENTIONS: Therapeutic exercises, Therapeutic activity, Neuromuscular re-education, Balance training, Gait training, Patient/Family education, Self Care, and Joint mobilization.  PLAN FOR NEXT SESSION: dual tasking balance activities, progress LE strengthening and balance training as appropriate, add balance to HEP   This entire session was performed under direct supervision and direction of a licensed therapist/therapist assistant . I have personally read, edited and approve of the note as written.    Randon Goldsmith, Student- PT  This entire session was performed under direct supervision and direction of a licensed therapist/therapist assistant . I have personally read, edited and approve of the note as written.   1:22 PM, 03/21/23 Precious Bard PT Physical Therapist - [facility]  Outpatient Physical Therapy- Main Campus [phone]   '

## 2023-03-21 ENCOUNTER — Ambulatory Visit: Payer: Medicare PPO

## 2023-03-21 ENCOUNTER — Ambulatory Visit: Payer: Medicare PPO | Admitting: Speech Pathology

## 2023-03-21 ENCOUNTER — Encounter: Payer: Medicare PPO | Admitting: Occupational Therapy

## 2023-03-21 DIAGNOSIS — I6389 Other cerebral infarction: Secondary | ICD-10-CM | POA: Diagnosis not present

## 2023-03-21 DIAGNOSIS — R296 Repeated falls: Secondary | ICD-10-CM | POA: Diagnosis not present

## 2023-03-21 DIAGNOSIS — R262 Difficulty in walking, not elsewhere classified: Secondary | ICD-10-CM | POA: Diagnosis not present

## 2023-03-21 DIAGNOSIS — M6281 Muscle weakness (generalized): Secondary | ICD-10-CM | POA: Diagnosis not present

## 2023-03-21 DIAGNOSIS — R2681 Unsteadiness on feet: Secondary | ICD-10-CM

## 2023-03-21 DIAGNOSIS — R41841 Cognitive communication deficit: Secondary | ICD-10-CM | POA: Diagnosis not present

## 2023-03-21 DIAGNOSIS — R414 Neurologic neglect syndrome: Secondary | ICD-10-CM | POA: Diagnosis not present

## 2023-03-21 DIAGNOSIS — I63531 Cerebral infarction due to unspecified occlusion or stenosis of right posterior cerebral artery: Secondary | ICD-10-CM | POA: Diagnosis not present

## 2023-03-21 DIAGNOSIS — R278 Other lack of coordination: Secondary | ICD-10-CM

## 2023-03-23 ENCOUNTER — Encounter: Payer: Medicare PPO | Admitting: Occupational Therapy

## 2023-03-23 ENCOUNTER — Ambulatory Visit: Payer: Medicare PPO | Admitting: Speech Pathology

## 2023-03-23 ENCOUNTER — Ambulatory Visit: Payer: Medicare PPO

## 2023-03-28 ENCOUNTER — Ambulatory Visit: Payer: Medicare PPO

## 2023-03-28 ENCOUNTER — Encounter: Payer: Medicare PPO | Admitting: Occupational Therapy

## 2023-03-28 ENCOUNTER — Encounter: Payer: Medicare PPO | Admitting: Speech Pathology

## 2023-03-28 DIAGNOSIS — R41841 Cognitive communication deficit: Secondary | ICD-10-CM | POA: Diagnosis not present

## 2023-03-28 DIAGNOSIS — R262 Difficulty in walking, not elsewhere classified: Secondary | ICD-10-CM

## 2023-03-28 DIAGNOSIS — I6389 Other cerebral infarction: Secondary | ICD-10-CM | POA: Diagnosis not present

## 2023-03-28 DIAGNOSIS — R278 Other lack of coordination: Secondary | ICD-10-CM | POA: Diagnosis not present

## 2023-03-28 DIAGNOSIS — I63531 Cerebral infarction due to unspecified occlusion or stenosis of right posterior cerebral artery: Secondary | ICD-10-CM | POA: Diagnosis not present

## 2023-03-28 DIAGNOSIS — M6281 Muscle weakness (generalized): Secondary | ICD-10-CM

## 2023-03-28 DIAGNOSIS — R414 Neurologic neglect syndrome: Secondary | ICD-10-CM | POA: Diagnosis not present

## 2023-03-28 DIAGNOSIS — R2681 Unsteadiness on feet: Secondary | ICD-10-CM

## 2023-03-28 DIAGNOSIS — R296 Repeated falls: Secondary | ICD-10-CM | POA: Diagnosis not present

## 2023-03-28 NOTE — Therapy (Addendum)
OUTPATIENT PHYSICAL THERAPY TREATMENT  Patient Name: Clayton [name] MRN: [id number] DOB:[birth year], 64, Male Today's Date: 02/21/2023  END OF SESSION:.  PT End of Session - 03/28/23 1102     Visit Number 14    Number of Visits 16    Date for PT Re-Evaluation 04/01/23    PT Start Time 1103    PT Stop Time 1143    PT Time Calculation (min) 40 min    Equipment Utilized During Treatment Gait belt    Activity Tolerance Patient tolerated treatment well    Behavior During Therapy Clayton [name] for tasks assessed/performed             *SmartPhrase corrected 04/19/23     Past Medical History:  Diagnosis Date   Acute gastrointestinal hemorrhage     Acute hypoxemic respiratory failure (HCC) 05/25/2022   Acute thrombotic stroke (HCC) 12/14/2022   Basal cell carcinoma     Blood in stool 08/30/2018   Blood transfusion without reported diagnosis 34 (age 28)    After traumatic brain injury   Broken ribs      2 BROKEN ON RIBS   CKD (chronic kidney disease)      PT DENIES   Color blindness     DVT (deep venous thrombosis) (HCC)      RIGHT LEG   Dyslexia     Fracture dislocation of wrist      RIGHT   GI bleed 09/02/2018   Hiatal hernia with GERD     HTN (hypertension)     Hypokalemia 05/26/2022   Hyponatremia 05/25/2022   Symptomatic anemia 09/03/2018   TBI (traumatic brain injury) (HCC)      w/skull Fx and crani in 1960s          *SmartPhrase corrected 04/19/23      Past Surgical History:  Procedure Laterality Date   COLONOSCOPY   2014    cleared for 5 yrs   COLONOSCOPY N/A 09/01/2018    Procedure: COLONOSCOPY;  Surgeon: Pasty Spillers, MD;  Location: [facility] ENDOSCOPY;  Service: Endoscopy;  Laterality: N/A;   CRANIECTOMY FOR DEPRESSED SKULL FRACTURE   1960'S   ESOPHAGOGASTRODUODENOSCOPY N/A 09/01/2018    Procedure: ESOPHAGOGASTRODUODENOSCOPY (EGD);  Surgeon: Pasty Spillers, MD;  Location: [facility] ENDOSCOPY;  Service: Endoscopy;  Laterality: N/A;   HERNIA  REPAIR        X2   IVC FILTER REMOVAL N/A 07/03/2017    Procedure: IVC FILTER REMOVAL;  Surgeon: Annice Needy, MD;  Location: [facility] INVASIVE CV LAB;  Service: Cardiovascular;  Laterality: N/A;   LUMBAR LAMINECTOMY/DECOMPRESSION MICRODISCECTOMY N/A 04/20/2020    Procedure: L2/3, L3/4 LAMINECTOMY;  Surgeon: Lucy Chris, MD;  Location: [facility] ORS;  Service: Neurosurgery;  Laterality: N/A;  2ND CASE   PERIPHERAL VASCULAR THROMBECTOMY Right 05/22/2017    Procedure: PERIPHERAL VASCULAR THROMBECTOMY;  Surgeon: Annice Needy, MD;  Location: [facility] INVASIVE CV LAB;  Service: Cardiovascular;  Laterality: Right;   SPINE SURGERY   December 2021   TONSILLECTOMY                   PCP: Tresa Garter MD  REFERRING PROVIDER: Wynn Banker MD  REFERRING DIAG: CVA  Rationale for Evaluation and Treatment: Rehabilitation  THERAPY DIAG:  No diagnosis found.  ONSET DATE: 12/12/22  SUBJECTIVE:  SUBJECTIVE STATEMENT: Pt reports no falls, no pain.  No other updates.  PERTINENT HISTORY:  Clayton [name] is a 88 y.o. male with medical history significant of cognitive impairment, history of TBI, hypertension presenting with weakness, confusion. Admitted to [name] 12/12/22 for worsening weakness and cognitive impairment. Per MRI impression: "Scattered small acute to subacute infarcts in the right PCA distribution". Prior Level of Function : Independent/Modified Independent;Working/employed. Per wife: pt able to walk around without issue, Indepentdent without use of AD, however has a history of severla falls in past 2 years which she attributes to his bilat sensory neuropathy. He has been largely noncompliant with RW recommendations since Feb 2024. Pt DC to inpatient rehab in [place], then home with HHPT.   PAIN:  Are you having pain?  No  PRECAUTIONS: Fall   WEIGHT BEARING RESTRICTIONS: No  FALLS:  Has patient fallen in last 6 months? Yes. Number of falls: unsure  LIVING ENVIRONMENT: Lives with: Wife Stairs: Yes: 8 entry stairs with 1 railing  Has following equipment at home: Dan Humphreys - 2 wheeled  OCCUPATION: Radiographer, therapeutic" makes crafts and art   PLOF: Independent with basic ADLs  PATIENT GOALS: Wife desires improved safety and stamina in the home, falls avoidance   NEXT MD VISIT: unknown  OBJECTIVE:   PATIENT SURVEYS:  FOTO: not appropriate due to memory/awareness deficits   COGNITION: Overall cognitive status: memory/awareness deficits     SENSATION: Per wife: sensory neuropathy BLE   FUNCTIONAL TESTS:  -5xSTS: 14.76sec hands free, no LOB  - : 985ft (several LOB when turning), no device, needs cues for navigation due to memory deficits  -TUG: 9.88sec, no device, no LOB   Balance Assessment: -SLS: ~3 sec bilat (large amplitude righting, full body)  -eyes closed firm: 30sec, mild to no sway -eyes closed foam: ~20 sec (sway uncontrolled), heavy sway increase -Berg Balance Test: pending     TODAY'S TREATMENT:                                                                                                                              DATE: 03/28/23 Therex:  - STS withblue medicine ball  2x10  -LTL stepping with 4# AW 10x length of bars  - Lateral step tap onto 6 inch step with 4#aw  15x rates easy, 20x fatiguing each LE   -Step up onto and off of airex each direction LTL and FWD with 4# aw 10-15 reps of each  -STS 10x   -Resistive gait  117m with 4# aw, cues to increase upright posture throughout  STS 5x   NMR:  Single leg cone activity: Three cones placed in a horizontal line in front of patient. Patient uses his LE to perform a foot tap on each cone (single leg balance and hip flexion x multiple reps each LE. Pt completes rounds with decreasing levels of UE support, not yet able  to complete without UE support  One foot on floor, one  on 6" step for SLB progression 2x30 sec with intermittent UE support   -Gait with balance components including horizontal and vertical head turns in // bars x multiple reps of each. Pt observed to be slightly unsteady.      PATIENT EDUCATION:  Education details: Pt educated throughout session about proper posture and technique with exercises. Improved exercise technique, movement at target joints, use of target muscles after min to mod verbal, visual, tactile cues.  Person educated: Patient Education method: Demonstration, Explanation  Education comprehension: Pt verbalized understanding HOME EXERCISE PROGRAM: Access Code: [email or web address] URL: [email or web address] Date: 02/02/2023 Prepared by: Maureen Ralphs  Exercises - Sit to Stand with Arms Crossed  - 3 x weekly - 3 sets - 10 reps - Standing Hip Abduction with Counter Support  - 3 x weekly - 3 sets - 10 reps - Standing Heel Raises  - 3 x weekly - 3 sets - 10 reps - 3 hold  ASSESSMENT:  CLINICAL IMPRESSION: PT continued plan of care as laid out in recent sessions. Pt mildly unsteady with gait with head turns, but otherwise tolerated interventions well, rating majority as easy. Patient will continue to benefit from skilled physical therapy to maximize function, improve quality of life, and decrease risk of falls.   OBJECTIVE IMPAIRMENTS: Abnormal gait, cardiopulmonary status limiting activity, decreased activity tolerance, decreased balance, decreased coordination, decreased mobility, difficulty walking, decreased strength, impaired flexibility, impaired sensation, impaired vision/perception, improper body mechanics, and postural dysfunction.  ACTIVITY LIMITATIONS: carrying, lifting, bending, sitting, standing, squatting, stairs, transfers, bed mobility, dressing, reach overhead, hygiene/grooming, locomotion level, and caring for others  PARTICIPATION LIMITATIONS:  meal prep, cleaning, laundry, personal finances, driving, shopping, community activity, occupation, and yard work  PERSONAL FACTORS: Age, Fitness, Past/current experiences, Time since onset of injury/illness/exacerbation, and 3+ comorbidities REHAB POTENTIAL: Good  CLINICAL DECISION MAKING: Evolving/moderate complexity  EVALUATION COMPLEXITY: moderate  SHORT TERM GOALS: Target date: 03/01/23  Patient to report compliance and success with home based program as prescribed by this site in order to help with pt self-efficacy in outcomes and symptoms management.  Baseline: HEP to be issued at next visit  Goal status: IN PROGRESS    LONG TERM GOALS: Target date: 11/30  Pt to demonstrate improved power in legs, activity tolerance, and dynamic balance AEB improved 5xSTS and TUG.  Baseline: eval: 5xSTS hands free: 15+seconds; TUG c device in 9.86 sec 03/14/2023: 5xSTS: 14.81s TUG: 8.28s Goal status: MET   2.  Pt to demonstrate improved tolerance and distance in by >12% to indicate improved ability to perform tasks in community for IADL.   Baseline: eval: : 941ft, no device, several LOB with turning 03/14/2023: 1080 ft, no device Goal status: MET  3.  Pt to report more consistent use of 2-hand AD wen performing AMB outside of the house.  Baseline: intermittent, inconsistent use of DME when AMB out of house. 03/14/2023: not addressed this session  Goal status: IN PROGRESS  4. Pt will improve BERG by at least 3 points in order to demonstrate clinically significant improvement in balance.   Baseline: 02/02/2023= 42/56; 02/14/2023: 40/56 03/14/2023: 44  Goal Status: IN PROGRESS PLAN:  PT FREQUENCY: 2x/week  PT DURATION: 2 months   PLANNED INTERVENTIONS: Therapeutic exercises, Therapeutic activity, Neuromuscular re-education, Balance training, Gait training, Patient/Family education, Self Care, and Joint mobilization.  PLAN FOR NEXT SESSION: dual tasking balance activities, progress  LE strengthening and balance training as appropriate, add balance to HEP     11:47 AM, 03/28/23 Oswaldo Conroy  Delila Pereyra PT Physical Therapist - [name]  Outpatient Physical Therapy- Main Campus [phone]   '

## 2023-04-03 NOTE — Therapy (Incomplete)
OUTPATIENT PHYSICAL THERAPY TREATMENT  Patient Name: Clayton [name] MRN: [id number] DOB:Apr 15, 1935, 48, Male Today's Date: 02/21/2023  END OF SESSION:.                 Past Medical History:  Diagnosis Date   Arthritis    COPD (chronic obstructive pulmonary disease) (HCC)    Diabetes mellitus without complication (HCC)    Dyspnea    occ due to copd/emphysema on exertion   Emphysema lung (HCC)    Emphysema of lung (HCC)    GERD (gastroesophageal reflux disease)    Lung cancer (HCC) 2018   Lung/METS TO HIP-Clayton [name] RECEIVING RADIATION TO RIGHT HIP    Pre-diabetes    Pulmonary embolism (HCC)    Past Surgical History:  Procedure Laterality Date   back surgey  2013   COLONOSCOPY  2013   COLONOSCOPY WITH PROPOFOL N/A 03/15/2018   Procedure: COLONOSCOPY WITH PROPOFOL;  Surgeon: Toney Reil, MD;  Location: [facility] ENDOSCOPY;  Service: Gastroenterology;  Laterality: N/A;   ENDOBRONCHIAL ULTRASOUND Right 02/17/2017   Procedure: ENDOBRONCHIAL ULTRASOUND;  Surgeon: Shane Crutch, MD;  Location: [facility] ORS;  Service: Pulmonary;  Laterality: Right;   IR FLUORO GUIDE PORT INSERTION RIGHT  03/16/2017   IR IMAGING GUIDED PORT INSERTION  06/19/2020   IR REMOVAL TUN ACCESS W/ PORT W/O FL MOD SED  06/06/2018   ROTATOR CUFF REPAIR  2013   Patient Active Problem List   Diagnosis Date Noted   Rectal bleeding 12/11/2019   Pneumonia of both lungs due to infectious organism 12/11/2019   1st degree AV block 12/05/2019   Aortic atherosclerosis (HCC) 12/03/2019   Lung cancer (HCC) 12/03/2019   Adrenal mass, right (HCC) 08/24/2019   Sepsis (HCC) 02/23/2019   Drug-induced polyneuropathy (HCC) 12/07/2018   Chronic systolic CHF (congestive heart failure) (HCC) 11/27/2018   Diabetes mellitus type 2, controlled, with complications (HCC) 11/27/2018   Emphysema of lung (HCC) 11/27/2018   History of COVID-19 11/22/2018   Acute respiratory failure with hypoxia (HCC) 11/22/2018    Squamous cell lung cancer (HCC) - stage 4 with mets to bone 11/22/2018   Type 2 diabetes mellitus without complication (HCC) 11/22/2018   Chronic anticoagulation - on Xarelto for hx of pulmonary embolism 11/22/2018   Adiposity 10/17/2017   Brash 10/17/2017   History of colon polyps 10/17/2017   Immunization, tetanus toxoid 10/17/2017   LBP (low back pain) 10/17/2017   GI bleed 09/26/2017   Pulmonary embolism without acute cor pulmonale (HCC) 05/18/2017   Goals of care, counseling/discussion 02/23/2017   Mild left ventricular systolic dysfunction 02/22/2017   SOB (shortness of breath) 02/15/2017   Malignant neoplasm of right upper lobe of lung (HCC) 01/24/2017   Cancer, metastatic to bone (HCC) 01/24/2017    PCP: Tresa Garter MD  REFERRING PROVIDER: Wynn Banker MD  REFERRING DIAG: CVA  Rationale for Evaluation and Treatment: Rehabilitation  THERAPY DIAG:  No diagnosis found.  ONSET DATE: 12/12/22  SUBJECTIVE:  SUBJECTIVE STATEMENT: Clayton [name] reports no falls, no pain.  No other updates.  PERTINENT HISTORY:  Clayton [name] is a 88 y.o. male with medical history significant of cognitive impairment, history of TBI, hypertension presenting with weakness, confusion. Admitted to [facility] 12/12/22 for worsening weakness and cognitive impairment. Per MRI impression: "Scattered small acute to subacute infarcts in the right PCA distribution". Prior Level of Function : Independent/Modified Independent;Working/employed. Per wife: Clayton [name] able to walk around without issue, Indepentdent without use of AD, however has a history of severla falls in past 2 years which she attributes to his bilat sensory neuropathy. He has been largely noncompliant with RW recommendations since Feb 2024. Clayton [name] DC to inpatient rehab in [place], then home with HHPT.    PAIN:  Are you having pain? No  PRECAUTIONS: Fall   WEIGHT BEARING RESTRICTIONS: No  FALLS:  Has patient fallen in last 6 months? Yes. Number of falls: unsure  LIVING ENVIRONMENT: Lives with: Wife Stairs: Yes: 8 entry stairs with 1 railing  Has following equipment at home: Dan Humphreys - 2 wheeled  OCCUPATION: Radiographer, therapeutic" makes crafts and art   PLOF: Independent with basic ADLs  PATIENT GOALS: Wife desires improved safety and stamina in the home, falls avoidance   NEXT MD VISIT: unknown  OBJECTIVE:   PATIENT SURVEYS:  FOTO: not appropriate due to memory/awareness deficits   COGNITION: Overall cognitive status: memory/awareness deficits     SENSATION: Per wife: sensory neuropathy BLE   FUNCTIONAL TESTS:  -5xSTS: 14.76sec hands free, no LOB  - : 958ft (several LOB when turning), no device, needs cues for navigation due to memory deficits  -TUG: 9.88sec, no device, no LOB   Balance Assessment: -SLS: ~3 sec bilat (large amplitude righting, full body)  -eyes closed firm: 30sec, mild to no sway -eyes closed foam: ~20 sec (sway uncontrolled), heavy sway increase -Berg Balance Test: pending     TODAY'S TREATMENT:                                                                                                                              DATE: 04/03/23 Therex:  - STS withblue medicine ball  2x10  -LTL stepping with 4# AW 10x length of bars  - Lateral step tap onto 6 inch step with 4#aw  15x rates easy, 20x fatiguing each LE   -Step up onto and off of airex each direction LTL and FWD with 4# aw 10-15 reps of each  -STS 10x   -Resistive gait  129m with 4# aw, cues to increase upright posture throughout  STS 5x   NMR:  Single leg cone activity: Three cones placed in a horizontal line in front of patient. Patient uses his LE to perform a foot tap on each cone (single leg balance and hip flexion x multiple reps each LE. Clayton [name] completes rounds with decreasing  levels of UE support, not yet able to complete without UE support  One foot on floor, one  on 6" step for SLB progression 2x30 sec with intermittent UE support   -Gait with balance components including horizontal and vertical head turns in // bars x multiple reps of each. Clayton [name] observed to be slightly unsteady.      PATIENT EDUCATION:  Education details: Clayton [name] educated throughout session about proper posture and technique with exercises. Improved exercise technique, movement at target joints, use of target muscles after min to mod verbal, visual, tactile cues.  Person educated: Patient Education method: Demonstration, Explanation  Education comprehension: Clayton [name] verbalized understanding HOME EXERCISE PROGRAM: Access Code: [email or web address] URL: [email or web address] Date: 02/02/2023 Prepared by: Maureen Ralphs  Exercises - Sit to Stand with Arms Crossed  - 3 x weekly - 3 sets - 10 reps - Standing Hip Abduction with Counter Support  - 3 x weekly - 3 sets - 10 reps - Standing Heel Raises  - 3 x weekly - 3 sets - 10 reps - 3 hold  ASSESSMENT:  CLINICAL IMPRESSION: Clayton [name] continued plan of care as laid out in recent sessions. Clayton [name] mildly unsteady with gait with head turns, but otherwise tolerated interventions well, rating majority as easy. Patient will continue to benefit from skilled physical therapy to maximize function, improve quality of life, and decrease risk of falls.   OBJECTIVE IMPAIRMENTS: Abnormal gait, cardiopulmonary status limiting activity, decreased activity tolerance, decreased balance, decreased coordination, decreased mobility, difficulty walking, decreased strength, impaired flexibility, impaired sensation, impaired vision/perception, improper body mechanics, and postural dysfunction.  ACTIVITY LIMITATIONS: carrying, lifting, bending, sitting, standing, squatting, stairs, transfers, bed mobility, dressing, reach overhead, hygiene/grooming, locomotion level, and caring for  others  PARTICIPATION LIMITATIONS: meal prep, cleaning, laundry, personal finances, driving, shopping, community activity, occupation, and yard work  PERSONAL FACTORS: Age, Fitness, Past/current experiences, Time since onset of injury/illness/exacerbation, and 3+ comorbidities REHAB POTENTIAL: Good  CLINICAL DECISION MAKING: Evolving/moderate complexity  EVALUATION COMPLEXITY: moderate  SHORT TERM GOALS: Target date: 03/01/23  Patient to report compliance and success with home based program as prescribed by this site in order to help with Clayton [name] self-efficacy in outcomes and symptoms management.  Baseline: HEP to be issued at next visit  Goal status: IN PROGRESS    LONG TERM GOALS: Target date: 11/30  Clayton [name] to demonstrate improved power in legs, activity tolerance, and dynamic balance AEB improved 5xSTS and TUG.  Baseline: eval: 5xSTS hands free: 15+seconds; TUG c device in 9.86 sec 03/14/2023: 5xSTS: 14.81s TUG: 8.28s Goal status: MET   2.  Clayton [name] to demonstrate improved tolerance and distance in by >12% to indicate improved ability to perform tasks in community for IADL.   Baseline: eval: : 968ft, no device, several LOB with turning 03/14/2023: 1080 ft, no device Goal status: MET  3.  Clayton [name] to report more consistent use of 2-hand AD wen performing AMB outside of the house.  Baseline: intermittent, inconsistent use of DME when AMB out of house. 03/14/2023: not addressed this session  Goal status: IN PROGRESS  4. Clayton [name] will improve BERG by at least 3 points in order to demonstrate clinically significant improvement in balance.   Baseline: 02/02/2023= 42/56; 02/14/2023: 40/56 03/14/2023: 44  Goal Status: IN PROGRESS PLAN:  Clayton [name] FREQUENCY: 2x/week  Clayton [name] DURATION: 2 months   PLANNED INTERVENTIONS: Therapeutic exercises, Therapeutic activity, Neuromuscular re-education, Balance training, Gait training, Patient/Family education, Self Care, and Joint mobilization.  PLAN FOR NEXT SESSION: dual  tasking balance activities, progress LE strengthening and balance training as appropriate, add balance to HEP     2:39 PM, 04/03/23 Precious Bard  Clayton [name] Physical Therapist - [facility]  Outpatient Physical Therapy- Main Campus [phone]   '

## 2023-04-04 ENCOUNTER — Ambulatory Visit: Payer: Medicare PPO

## 2023-04-04 ENCOUNTER — Encounter: Payer: Medicare PPO | Admitting: Occupational Therapy

## 2023-04-04 ENCOUNTER — Encounter: Payer: Medicare PPO | Admitting: Speech Pathology

## 2023-04-06 ENCOUNTER — Encounter: Payer: Medicare PPO | Admitting: Speech Pathology

## 2023-04-06 ENCOUNTER — Encounter: Payer: Medicare PPO | Admitting: Occupational Therapy

## 2023-04-06 ENCOUNTER — Ambulatory Visit: Payer: Medicare PPO | Attending: Physical Medicine & Rehabilitation

## 2023-04-06 DIAGNOSIS — I6389 Other cerebral infarction: Secondary | ICD-10-CM | POA: Diagnosis not present

## 2023-04-06 DIAGNOSIS — R2681 Unsteadiness on feet: Secondary | ICD-10-CM | POA: Diagnosis not present

## 2023-04-06 DIAGNOSIS — R278 Other lack of coordination: Secondary | ICD-10-CM | POA: Insufficient documentation

## 2023-04-06 DIAGNOSIS — M6281 Muscle weakness (generalized): Secondary | ICD-10-CM | POA: Diagnosis not present

## 2023-04-06 DIAGNOSIS — R262 Difficulty in walking, not elsewhere classified: Secondary | ICD-10-CM | POA: Insufficient documentation

## 2023-04-06 NOTE — Therapy (Addendum)
OUTPATIENT PHYSICAL THERAPY TREATMENT/RECERT/PT DISCHARGE   Patient Name: Clayton [name]. [place] MRN: [id number] DOB:[birth year], 51, Male Today's Date: 02/21/2023  END OF SESSION:.  PT End of Session - 04/06/23 1144     Visit Number 15    Number of Visits 15    Date for PT Re-Evaluation 04/06/23    PT Start Time 1145    PT Stop Time 1215    PT Time Calculation (min) 30 min    Equipment Utilized During Treatment Gait belt    Activity Tolerance Patient tolerated treatment well    Behavior During Therapy WFL for tasks assessed/performed                          Past Medical History:  Diagnosis Date   Acute gastrointestinal hemorrhage     Acute hypoxemic respiratory failure (HCC) 05/25/2022   Acute thrombotic stroke (HCC) 12/14/2022   Basal cell carcinoma     Blood in stool 08/30/2018   Blood transfusion without reported diagnosis 13 (age 60)    After traumatic brain injury   Broken ribs      2 BROKEN ON RIBS   CKD (chronic kidney disease)      PT DENIES   Color blindness     DVT (deep venous thrombosis) (HCC)      RIGHT LEG   Dyslexia     Fracture dislocation of wrist      RIGHT   GI bleed 09/02/2018   Hiatal hernia with GERD     HTN (hypertension)     Hypokalemia 05/26/2022   Hyponatremia 05/25/2022   Symptomatic anemia 09/03/2018   TBI (traumatic brain injury) (HCC)      w/skull Fx and crani in 1960s                  Past Surgical History:  Procedure Laterality Date   COLONOSCOPY   2014    cleared for 5 yrs   COLONOSCOPY N/A 09/01/2018    Procedure: COLONOSCOPY;  Surgeon: Pasty Spillers, MD;  Location: [facility] ENDOSCOPY;  Service: Endoscopy;  Laterality: N/A;   CRANIECTOMY FOR DEPRESSED SKULL FRACTURE   1960'S   ESOPHAGOGASTRODUODENOSCOPY N/A 09/01/2018    Procedure: ESOPHAGOGASTRODUODENOSCOPY (EGD);  Surgeon: Pasty Spillers, MD;  Location: [facility] ENDOSCOPY;  Service: Endoscopy;  Laterality: N/A;   HERNIA REPAIR        X2   IVC  FILTER REMOVAL N/A 07/03/2017    Procedure: IVC FILTER REMOVAL;  Surgeon: Annice Needy, MD;  Location: [facility] INVASIVE CV LAB;  Service: Cardiovascular;  Laterality: N/A;   LUMBAR LAMINECTOMY/DECOMPRESSION MICRODISCECTOMY N/A 04/20/2020    Procedure: L2/3, L3/4 LAMINECTOMY;  Surgeon: Lucy Chris, MD;  Location: [facility] ORS;  Service: Neurosurgery;  Laterality: N/A;  2ND CASE   PERIPHERAL VASCULAR THROMBECTOMY Right 05/22/2017    Procedure: PERIPHERAL VASCULAR THROMBECTOMY;  Surgeon: Annice Needy, MD;  Location: [facility] INVASIVE CV LAB;  Service: Cardiovascular;  Laterality: Right;   SPINE SURGERY   December 2021   TONSILLECTOMY          PCP: Tresa Garter MD  REFERRING PROVIDER: Wynn Banker MD  REFERRING DIAG: CVA  Rationale for Evaluation and Treatment: Rehabilitation  THERAPY DIAG:  No diagnosis found.  ONSET DATE: 12/12/22  SUBJECTIVE:  SUBJECTIVE STATEMENT: Pt reports he had a quiet Thanksgiving but a good one. He states he feels he is doing well and denies any falls. Wife reports his primary issues are continued cognitive decline which affects his safety.   PERTINENT HISTORY:  Clayton [name] is a 88 y.o. male with medical history significant of cognitive impairment, history of TBI, hypertension presenting with weakness, confusion. Admitted to [facility] 12/12/22 for worsening weakness and cognitive impairment. Per MRI impression: "Scattered small acute to subacute infarcts in the right PCA distribution". Prior Level of Function : Independent/Modified Independent;Working/employed. Per wife: pt able to walk around without issue, Indepentdent without use of AD, however has a history of severla falls in past 2 years which she attributes to his bilat sensory neuropathy. He has been largely noncompliant with RW recommendations  since Feb 2024. Pt DC to inpatient rehab in [place], then home with HHPT.   PAIN:  Are you having pain? No  PRECAUTIONS: Fall   WEIGHT BEARING RESTRICTIONS: No  FALLS:  Has patient fallen in last 6 months? Yes. Number of falls: unsure  LIVING ENVIRONMENT: Lives with: Wife Stairs: Yes: 8 entry stairs with 1 railing  Has following equipment at home: Dan Humphreys - 2 wheeled  OCCUPATION: Radiographer, therapeutic" makes crafts and art   PLOF: Independent with basic ADLs  PATIENT GOALS: Wife desires improved safety and stamina in the home, falls avoidance   NEXT MD VISIT: unknown  OBJECTIVE:   PATIENT SURVEYS:  FOTO: not appropriate due to memory/awareness deficits   COGNITION: Overall cognitive status: memory/awareness deficits     SENSATION: Per wife: sensory neuropathy BLE   FUNCTIONAL TESTS:  -5xSTS: 14.76sec hands free, no LOB  - : 910ft (several LOB when turning), no device, needs cues for navigation due to memory deficits  -TUG: 9.88sec, no device, no LOB   Balance Assessment: -SLS: ~3 sec bilat (large amplitude righting, full body)  -eyes closed firm: 30sec, mild to no sway -eyes closed foam: ~20 sec (sway uncontrolled), heavy sway increase -Berg Balance Test: pending     TODAY'S TREATMENT:                                                                                                                              DATE: 04/06/23  Physical therapy treatment session today consisted of completing assessment of goals and administration of testing as demonstrated and documented in flow sheet, treatment, and goals section of this note. Addition treatments may be found below.   10 Meter walk test= 1.2 m/s without AD  BERG= 52/56   [facility] PT Assessment - 04/06/23 1158       Berg Balance Test   Sit to Stand Able to stand without using hands and stabilize independently    Standing Unsupported Able to stand safely 2 minutes    Sitting with Back Unsupported but Feet Supported on  Floor or Stool Able to sit safely and securely 2 minutes    Stand to  Sit Sits safely with minimal use of hands    Transfers Able to transfer safely, minor use of hands    Standing Unsupported with Eyes Closed Able to stand 10 seconds safely    Standing Unsupported with Feet Together Able to place feet together independently and stand 1 minute safely    From Standing, Reach Forward with Outstretched Arm Can reach confidently >25 cm (10")    From Standing Position, Pick up Object from Floor Able to pick up shoe safely and easily    From Standing Position, Turn to Look Behind Over each Shoulder Looks behind from both sides and weight shifts well    Turn 360 Degrees Able to turn 360 degrees safely in 4 seconds or less    Standing Unsupported, Alternately Place Feet on Step/Stool Able to stand independently and safely and complete 8 steps in 20 seconds    Standing Unsupported, One Foot in Front Able to plae foot ahead of the other independently and hold 30 seconds    Standing on One Leg Tries to lift leg/unable to hold 3 seconds but remains standing independently    Total Score 52                PATIENT EDUCATION:  Education details: Pt educated throughout session about proper posture and technique with exercises. Improved exercise technique, movement at target joints, use of target muscles after min to mod verbal, visual, tactile cues.  Person educated: Patient Education method: Demonstration, Explanation  Education comprehension: Pt verbalized understanding HOME EXERCISE PROGRAM: Access Code: [email or web address] URL: [email or web address] Date: 02/02/2023 Prepared by: Maureen Ralphs  Exercises - Sit to Stand with Arms Crossed  - 3 x weekly - 3 sets - 10 reps - Standing Hip Abduction with Counter Support  - 3 x weekly - 3 sets - 10 reps - Standing Heel Raises  - 3 x weekly - 3 sets - 10 reps - 3 hold  ASSESSMENT:  CLINICAL IMPRESSION: Patient due for recert visit. He  returns to clinic today and presents with much improved overall mobility. Walking into clinic without any assistive device and only supervision for navigation due to impaired cognition. He was able to demonstrate good gait speed and much improved score with BERG balance test. His only difficulty was tandem and SLS. He has met all other goals and both he and his wife are agreeable to discharge today. He is appropriate for discharge with much improved overall mobility and balance since initiating services.     OBJECTIVE IMPAIRMENTS: Abnormal gait, cardiopulmonary status limiting activity, decreased activity tolerance, decreased balance, decreased coordination, decreased mobility, difficulty walking, decreased strength, impaired flexibility, impaired sensation, impaired vision/perception, improper body mechanics, and postural dysfunction.  ACTIVITY LIMITATIONS: carrying, lifting, bending, sitting, standing, squatting, stairs, transfers, bed mobility, dressing, reach overhead, hygiene/grooming, locomotion level, and caring for others  PARTICIPATION LIMITATIONS: meal prep, cleaning, laundry, personal finances, driving, shopping, community activity, occupation, and yard work  PERSONAL FACTORS: Age, Fitness, Past/current experiences, Time since onset of injury/illness/exacerbation, and 3+ comorbidities REHAB POTENTIAL: Good  CLINICAL DECISION MAKING: Evolving/moderate complexity  EVALUATION COMPLEXITY: moderate  SHORT TERM GOALS: Target date: 03/01/23  Patient to report compliance and success with home based program as prescribed by this site in order to help with pt self-efficacy in outcomes and symptoms management.  Baseline: HEP to be issued at next visit; Patient reports doing mostly walking and woodworking but not always doing any formal exercises per HEP.  Goal status: Partially met    LONG TERM  GOALS: Target date: 11/30  Pt to demonstrate improved power in legs, activity tolerance, and dynamic  balance AEB improved 5xSTS and TUG.  Baseline: eval: 5xSTS hands free: 15+seconds; TUG c device in 9.86 sec 03/14/2023: 5xSTS: 14.81s TUG: 8.28s Goal status: MET   2.  Pt to demonstrate improved tolerance and distance in by >12% to indicate improved ability to perform tasks in community for IADL.   Baseline: eval: : 994ft, no device, several LOB with turning 03/14/2023: 1080 ft, no device Goal status: MET  3.  Pt to report more consistent use of 2-hand AD wen performing AMB outside of the house.  Baseline: intermittent, inconsistent use of DME when AMB out of house. 03/14/2023: not addressed this session; 04/06/2023- Patient reports mostly using no device in home and reports using walker at night when reminded. Not directly assessed Goal status: IN PROGRESS  4. Pt will improve BERG by at least 3 points in order to demonstrate clinically significant improvement in balance.   Baseline: 02/02/2023= 42/56; 02/14/2023: 40/56 03/14/2023: 44 04/06/2023= 52/56  Goal Status: MET PLAN:  PT FREQUENCY: 1 visit  PT DURATION: 1 visit  PLANNED INTERVENTIONS: Therapeutic exercises, Therapeutic activity, Neuromuscular re-education, Balance training, Gait training, Patient/Family education, Self Care, and Joint mobilization.  PLAN FOR NEXT SESSION: Discharge today with goals met     2:15 PM, 04/06/23 Lenda Kelp PT Physical Therapist - [facility]  Outpatient Physical Therapy- Main Campus [phone]   '

## 2023-04-11 ENCOUNTER — Encounter: Payer: Medicare PPO | Admitting: Speech Pathology

## 2023-04-11 ENCOUNTER — Ambulatory Visit: Payer: Medicare PPO

## 2023-04-11 ENCOUNTER — Encounter: Payer: Medicare PPO | Admitting: Occupational Therapy

## 2023-04-13 ENCOUNTER — Encounter: Payer: Medicare PPO | Admitting: Speech Pathology

## 2023-04-13 ENCOUNTER — Encounter: Payer: Medicare PPO | Admitting: Occupational Therapy

## 2023-04-13 ENCOUNTER — Ambulatory Visit: Payer: Medicare PPO

## 2023-04-18 ENCOUNTER — Encounter: Payer: Medicare PPO | Admitting: Occupational Therapy

## 2023-04-18 ENCOUNTER — Encounter: Payer: Medicare PPO | Admitting: Speech Pathology

## 2023-04-18 ENCOUNTER — Ambulatory Visit: Payer: Medicare PPO

## 2023-04-20 ENCOUNTER — Encounter: Payer: Medicare PPO | Admitting: Occupational Therapy

## 2023-04-20 ENCOUNTER — Encounter: Payer: Medicare PPO | Admitting: Speech Pathology

## 2023-04-20 ENCOUNTER — Ambulatory Visit: Payer: Medicare PPO

## 2023-04-25 ENCOUNTER — Ambulatory Visit: Payer: Medicare PPO

## 2023-04-25 ENCOUNTER — Encounter: Payer: Medicare PPO | Admitting: Speech Pathology

## 2023-04-27 ENCOUNTER — Encounter: Payer: Medicare PPO | Admitting: Speech Pathology

## 2023-05-02 ENCOUNTER — Ambulatory Visit: Payer: Medicare PPO

## 2023-05-02 ENCOUNTER — Encounter: Payer: Medicare PPO | Admitting: Occupational Therapy

## 2023-05-02 ENCOUNTER — Encounter: Payer: Medicare PPO | Admitting: Speech Pathology

## 2023-05-04 ENCOUNTER — Ambulatory Visit: Payer: Medicare PPO

## 2023-05-04 ENCOUNTER — Encounter: Payer: Medicare PPO | Admitting: Speech Pathology

## 2023-05-04 ENCOUNTER — Encounter: Payer: Medicare PPO | Admitting: Occupational Therapy

## 2023-05-09 ENCOUNTER — Ambulatory Visit: Payer: Medicare PPO

## 2023-05-09 ENCOUNTER — Encounter: Payer: Medicare PPO | Admitting: Speech Pathology

## 2023-05-09 ENCOUNTER — Encounter: Payer: Medicare PPO | Admitting: Occupational Therapy

## 2023-05-11 ENCOUNTER — Encounter: Payer: Medicare PPO | Admitting: Occupational Therapy

## 2023-05-11 ENCOUNTER — Encounter: Payer: Medicare PPO | Admitting: Speech Pathology

## 2023-05-11 ENCOUNTER — Ambulatory Visit: Payer: Medicare PPO

## 2023-05-16 ENCOUNTER — Encounter: Payer: Medicare PPO | Admitting: Occupational Therapy

## 2023-05-16 ENCOUNTER — Ambulatory Visit: Payer: Medicare PPO

## 2023-05-16 ENCOUNTER — Encounter: Payer: Medicare PPO | Admitting: Speech Pathology

## 2023-05-17 DIAGNOSIS — F02B11 Dementia in other diseases classified elsewhere, moderate, with agitation: Secondary | ICD-10-CM | POA: Diagnosis not present

## 2023-05-17 DIAGNOSIS — F411 Generalized anxiety disorder: Secondary | ICD-10-CM | POA: Diagnosis not present

## 2023-05-17 DIAGNOSIS — G301 Alzheimer's disease with late onset: Secondary | ICD-10-CM | POA: Diagnosis not present

## 2023-05-18 ENCOUNTER — Encounter: Payer: Medicare PPO | Admitting: Occupational Therapy

## 2023-05-18 ENCOUNTER — Encounter: Payer: Medicare PPO | Admitting: Speech Pathology

## 2023-05-18 ENCOUNTER — Ambulatory Visit: Payer: Medicare PPO

## 2023-05-19 ENCOUNTER — Ambulatory Visit: Payer: Medicare PPO | Admitting: Physical Medicine & Rehabilitation

## 2023-05-19 ENCOUNTER — Other Ambulatory Visit: Payer: Self-pay | Admitting: Family Medicine

## 2023-05-19 DIAGNOSIS — K449 Diaphragmatic hernia without obstruction or gangrene: Secondary | ICD-10-CM

## 2023-05-19 DIAGNOSIS — H53001 Unspecified amblyopia, right eye: Secondary | ICD-10-CM | POA: Diagnosis not present

## 2023-05-19 DIAGNOSIS — Z8719 Personal history of other diseases of the digestive system: Secondary | ICD-10-CM

## 2023-05-19 DIAGNOSIS — Z0100 Encounter for examination of eyes and vision without abnormal findings: Secondary | ICD-10-CM | POA: Diagnosis not present

## 2023-05-19 DIAGNOSIS — H2513 Age-related nuclear cataract, bilateral: Secondary | ICD-10-CM | POA: Diagnosis not present

## 2023-05-19 DIAGNOSIS — Z8673 Personal history of transient ischemic attack (TIA), and cerebral infarction without residual deficits: Secondary | ICD-10-CM | POA: Diagnosis not present

## 2023-05-19 DIAGNOSIS — H53462 Homonymous bilateral field defects, left side: Secondary | ICD-10-CM | POA: Diagnosis not present

## 2023-05-23 ENCOUNTER — Encounter: Payer: Medicare PPO | Admitting: Occupational Therapy

## 2023-05-23 ENCOUNTER — Encounter: Payer: Medicare PPO | Admitting: Speech Pathology

## 2023-05-23 ENCOUNTER — Ambulatory Visit: Payer: Medicare PPO

## 2023-05-23 ENCOUNTER — Encounter: Payer: Medicare PPO | Admitting: Physical Medicine & Rehabilitation

## 2023-05-25 ENCOUNTER — Encounter: Payer: Medicare PPO | Admitting: Speech Pathology

## 2023-05-25 ENCOUNTER — Ambulatory Visit: Payer: Medicare PPO

## 2023-05-25 ENCOUNTER — Encounter: Payer: Medicare PPO | Admitting: Occupational Therapy

## 2023-05-30 ENCOUNTER — Encounter: Payer: Medicare PPO | Admitting: Occupational Therapy

## 2023-05-30 ENCOUNTER — Encounter: Payer: Medicare PPO | Admitting: Speech Pathology

## 2023-05-30 ENCOUNTER — Ambulatory Visit: Payer: Medicare PPO

## 2023-06-01 ENCOUNTER — Encounter: Payer: Medicare PPO | Admitting: Speech Pathology

## 2023-06-01 ENCOUNTER — Encounter: Payer: Medicare PPO | Admitting: Occupational Therapy

## 2023-06-01 ENCOUNTER — Ambulatory Visit: Payer: Medicare PPO

## 2023-06-06 ENCOUNTER — Ambulatory Visit: Payer: Medicare PPO

## 2023-06-06 ENCOUNTER — Encounter: Payer: Medicare PPO | Admitting: Occupational Therapy

## 2023-06-06 ENCOUNTER — Encounter: Payer: Medicare PPO | Admitting: Speech Pathology

## 2023-06-06 ENCOUNTER — Encounter: Payer: Medicare PPO | Attending: Physical Medicine & Rehabilitation | Admitting: Physical Medicine & Rehabilitation

## 2023-06-06 ENCOUNTER — Encounter: Payer: Self-pay | Admitting: Physical Medicine & Rehabilitation

## 2023-06-06 VITALS — BP 135/77 | HR 73 | Ht 67.0 in | Wt 166.0 lb

## 2023-06-06 DIAGNOSIS — R269 Unspecified abnormalities of gait and mobility: Secondary | ICD-10-CM | POA: Insufficient documentation

## 2023-06-06 DIAGNOSIS — I69398 Other sequelae of cerebral infarction: Secondary | ICD-10-CM | POA: Diagnosis not present

## 2023-06-06 DIAGNOSIS — H53462 Homonymous bilateral field defects, left side: Secondary | ICD-10-CM | POA: Diagnosis not present

## 2023-06-06 NOTE — Progress Notes (Signed)
 Subjective:  89 y.o. male with history of GI bleed, DVT, HTN in the past, TBI, cognitive impairment who was admitted to [facility] on 12/12/2022 with reports of weakness, tendency to bump into things as well as confusion x 1 day.  Wife also reported multiple falls for a week.  He was found to have subacute infarct right PCA and CTA showed abrupt occlusion right PCA.  And P1 segment with atherosclerotic disease bilateral carotid bifurcations.  Neurology felt the stroke was thrombotic and recommended Plavix  for secondary stroke prevention due to history of aspirin allergy.  A Zio patch was also placed to rule out arrhythmias as cause of stroke.  He has had issues with agitation and Seroquel  was added to help improve sleep hygiene.  [name] did report that patient had pulled off Zio patch multiple times due to confusion and agitation.  Therapy was working with patient was limited by left HH with left inattention, decreased coordination left upper extremity, cognitive deficit with difficulty following commands as well as poor safety awareness   Admit date: 12/15/2022 Discharge date: 12/29/2022  Patient ID: Clayton [name], male    DOB: [birth year], 89 y.o.   MRN: [id number]  HPI 89 year old male here with his wife for follow-up of CVA.  Prior history of dementia which has been advancing over the last year or so.  He had worsening of his cognitive skills after his stroke.  In addition he had left field deficit due to right PCA infarct He needs supervision assistance for balance but otherwise is able to dress and bathe himself.  He falls about once a week according to the wife.  He has not had any injuries with this.  He has a new primary care physician, he remains on clopidogrel  for CVA prophylaxis. Pain Inventory Average Pain  no pain Pain Right Now  no pain My pain is  no pain  LOCATION OF PAIN    BOWEL Number of stools per week: 6-7    BLADDER Normal    Mobility walk without assistance ability to  climb steps?  yes Do you have any goals in this area?  yes  Function retired I need assistance with the following:  dressing, bathing, household duties, shopping, and wife takes care of duties Do you have any goals in this area?  yes  Neuro/Psych trouble walking confusion depression  Prior Studies Any changes since last visit?  no  Physicians involved in your care Any changes since last visit?  no   Family History  Problem Relation Age of Onset   Heart disease Father    Social History   Socioeconomic History   Marital status: Married    Spouse name: Not on file   Number of children: 3   Years of education: Not on file   Highest education level: Associate degree: occupational, scientist, product/process development, or vocational program  Occupational History   Not on file  Tobacco Use   Smoking status: Former    Current packs/day: 0.00    Average packs/day: 0.5 packs/day for 3.5 years (1.7 ttl pk-yrs)    Types: Cigarettes    Start date: 07/03/1955    Quit date: 01/01/1959    Years since quitting: 64.4   Smokeless tobacco: Never   Tobacco comments:    I smoked from about age 60 to 34.  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   Overall Financial Resource Strain (CARDIA)    Difficulty of Paying Living Expenses: Not very hard  Food Insecurity: No Food Insecurity (01/03/2023)   [facility]   Hunger Vital Sign    Worried About Running Out of Food in the Last Year: Never true    Ran Out of  Food in the Last Year: Never true  Transportation Needs: No Transportation Needs (01/03/2023)   Received from [facility] - Transportation    In the past 12 months, has lack of transportation kept you from medical appointments or from getting medications?: No    Lack of Transportation (Non-Medical): No  Physical Activity: Inactive (11/10/2022)   Exercise Vital Sign    Days of Exercise per Week: 0 days    Minutes of Exercise per Session: 0 min  Stress: No Stress Concern Present (11/10/2022)   [employer] of Occupational Health - Occupational Stress Questionnaire    Feeling of Stress : Not at all  Social Connections: Moderately Isolated (11/10/2022)   Social Connection and Isolation Panel [NHANES]    Frequency of Communication with Friends and Family: More than three times a week    Frequency of Social Gatherings with Friends and Family: Three times a week    Attends Religious Services: Never    Active Member of Clubs or Organizations: No    Attends Banker Meetings: Never    Marital Status: Married   Past Surgical History:  Procedure Laterality Date   COLONOSCOPY  2014   cleared for 5 yrs   COLONOSCOPY N/A 09/01/2018   Procedure: COLONOSCOPY;  Surgeon: Janalyn Keene NOVAK, MD;  Location: [facility] ENDOSCOPY;  Service: Endoscopy;  Laterality: N/A;   CRANIECTOMY FOR DEPRESSED SKULL FRACTURE  1960'S   ESOPHAGOGASTRODUODENOSCOPY N/A 09/01/2018   Procedure: ESOPHAGOGASTRODUODENOSCOPY (EGD);  Surgeon: Janalyn Keene NOVAK, MD;  Location: [facility] ENDOSCOPY;  Service: Endoscopy;  Laterality: N/A;   HERNIA REPAIR     X2   IVC FILTER REMOVAL N/A 07/03/2017   Procedure: IVC FILTER REMOVAL;  Surgeon: Marea Selinda RAMAN, MD;  Location: [facility] INVASIVE CV LAB;  Service: Cardiovascular;  Laterality: N/A;   LUMBAR LAMINECTOMY/DECOMPRESSION MICRODISCECTOMY N/A 04/20/2020   Procedure: L2/3, L3/4 LAMINECTOMY;  Surgeon: Bluford Standing, MD;  Location: [facility] ORS;  Service:  Neurosurgery;  Laterality: N/A;  2ND CASE   PERIPHERAL VASCULAR THROMBECTOMY Right 05/22/2017   Procedure: PERIPHERAL VASCULAR THROMBECTOMY;  Surgeon: Marea Selinda RAMAN, MD;  Location: [facility] INVASIVE CV LAB;  Service: Cardiovascular;  Laterality: Right;   SPINE SURGERY  December 2021   TONSILLECTOMY     Past Medical History:  Diagnosis Date   Acute gastrointestinal hemorrhage    Acute hypoxemic respiratory failure (HCC) 05/25/2022   Acute thrombotic stroke (HCC) 12/14/2022   Basal cell carcinoma    Blood in stool 08/30/2018   Blood transfusion without reported diagnosis 1961 (age 67)   After traumatic brain injury   Broken ribs    2 BROKEN ON RIBS   CKD (chronic kidney disease)  PT DENIES   Color blindness    DVT (deep venous thrombosis) (HCC)    RIGHT LEG   Dyslexia    Fracture dislocation of wrist    RIGHT   GI bleed 09/02/2018   Hiatal hernia with GERD    HTN (hypertension)    Hypokalemia 05/26/2022   Hyponatremia 05/25/2022   Symptomatic anemia 09/03/2018   TBI (traumatic brain injury) (HCC)    w/skull Fx and crani in 1960s   Ht 5' 7 (1.702 m)   Wt 166 lb (75.3 kg)   BMI 26.00 kg/m   Opioid Risk Score:   Fall Risk Score:  `1  Depression screen [facility] 2/9     06/06/2023   12:43 PM 01/10/2023    1:05 PM 11/10/2022    8:48 AM 08/04/2022    8:37 AM 07/01/2022   10:34 AM 06/10/2022    1:35 PM 06/02/2022    3:10 PM  Depression screen PHQ 2/9  Decreased Interest 0 0 0 0 0 0 0  Down, Depressed, Hopeless 0 0 0 0 0 0 0  PHQ - 2 Score 0 0 0 0 0 0 0  Altered sleeping  0 0 0 0 0 0  Tired, decreased energy  0 0 1 1 0 0  Change in appetite  0 0 0 0 0 0  Feeling bad or failure about yourself   0 0 0 0 0 0  Trouble concentrating  0 0 0 0 0 0  Moving slowly or fidgety/restless  0 0 0 0 0 0  Suicidal thoughts  0 0 0 0 0 0  PHQ-9 Score  0 0 1 1 0 0  Difficult doing work/chores   Not difficult at all Not difficult at all Not difficult at all Not difficult at all Not difficult at all     Review of Systems  Eyes:  Positive for visual disturbance.       Loss of field of vision  Musculoskeletal:        Off balance  Neurological:        Depression   Psychiatric/Behavioral:  Positive for confusion.   All other systems reviewed and are negative.      Objective:   Physical Exam Patient is oriented to person only, thinks he is in [place], thinks the month is December and the year is 2013 He is able to name simple objects such as spectacles and pen Motor strength is 5/5 bilateral deltoid biceps, triceps, grip hip flexion extensor ankle dorsiflexor Minimal dysmetria left finger-nose-finger Ambulates without assistive device no evidence of toe drag or knee instability.  He does have some instability with turns but no loss of balance      Assessment & Plan:   1.  Right PCA distribution infarct as discussed with wife the main impact is additional worsening of his cognitive functioning as well as for visual perceptual issues on the left side and mild balance disorder.  We discussed that deficits related to the stroke may improve over the next 6 or more months however he has a diagnosis of dementia which is worsening over time. It does not appear that his cognitive deficits have improved over the last 6 months.  As discussed with patient and his wife unlikely to do so at this time   Patient will need to follow-up with primary care.  I do not think there is a need for outpatient PT or OT given his fairly independent functional status.  I also expect that  his left homonymous hemianopsia is unlikely to spontaneously improve given the timeframe since CVA.  Location of CVA Physical medicine rehab follow-up on as-needed basis

## 2023-06-08 ENCOUNTER — Encounter: Payer: Medicare PPO | Admitting: Speech Pathology

## 2023-06-08 ENCOUNTER — Ambulatory Visit: Payer: Medicare PPO

## 2023-06-08 ENCOUNTER — Encounter: Payer: Medicare PPO | Admitting: Occupational Therapy

## 2023-06-13 ENCOUNTER — Encounter: Payer: Medicare PPO | Admitting: Occupational Therapy

## 2023-06-13 ENCOUNTER — Ambulatory Visit: Payer: Medicare PPO

## 2023-06-13 ENCOUNTER — Encounter: Payer: Medicare PPO | Admitting: Speech Pathology

## 2023-06-15 ENCOUNTER — Encounter: Payer: Medicare PPO | Admitting: Occupational Therapy

## 2023-06-15 ENCOUNTER — Encounter: Payer: Medicare PPO | Admitting: Speech Pathology

## 2023-06-15 ENCOUNTER — Ambulatory Visit: Payer: Medicare PPO

## 2023-06-20 ENCOUNTER — Ambulatory Visit: Payer: Medicare PPO

## 2023-06-20 ENCOUNTER — Encounter: Payer: Medicare PPO | Admitting: Speech Pathology

## 2023-06-20 ENCOUNTER — Encounter: Payer: Medicare PPO | Admitting: Occupational Therapy

## 2023-06-22 ENCOUNTER — Ambulatory Visit: Payer: Medicare PPO

## 2023-06-22 ENCOUNTER — Encounter: Payer: Medicare PPO | Admitting: Occupational Therapy

## 2023-06-22 ENCOUNTER — Encounter: Payer: Medicare PPO | Admitting: Speech Pathology

## 2023-06-27 ENCOUNTER — Encounter: Payer: Medicare PPO | Admitting: Occupational Therapy

## 2023-06-27 ENCOUNTER — Encounter: Payer: Medicare PPO | Admitting: Speech Pathology

## 2023-06-27 ENCOUNTER — Ambulatory Visit: Payer: Medicare PPO

## 2023-06-29 ENCOUNTER — Ambulatory Visit: Payer: Medicare PPO

## 2023-06-29 ENCOUNTER — Encounter: Payer: Medicare PPO | Admitting: Speech Pathology

## 2023-07-04 ENCOUNTER — Encounter: Payer: Medicare PPO | Admitting: Occupational Therapy

## 2023-07-04 ENCOUNTER — Encounter: Payer: Medicare PPO | Admitting: Speech Pathology

## 2023-07-04 ENCOUNTER — Ambulatory Visit: Payer: Medicare PPO

## 2023-07-06 ENCOUNTER — Encounter: Payer: Medicare PPO | Admitting: Speech Pathology

## 2023-07-06 ENCOUNTER — Ambulatory Visit: Payer: Medicare PPO

## 2023-07-11 ENCOUNTER — Encounter: Payer: Medicare PPO | Admitting: Occupational Therapy

## 2023-07-11 ENCOUNTER — Encounter: Payer: Medicare PPO | Admitting: Speech Pathology

## 2023-07-11 ENCOUNTER — Ambulatory Visit: Payer: Medicare PPO

## 2023-07-13 ENCOUNTER — Encounter: Payer: Medicare PPO | Admitting: Speech Pathology

## 2023-07-13 ENCOUNTER — Ambulatory Visit: Payer: Medicare PPO

## 2023-07-18 ENCOUNTER — Encounter: Payer: Medicare PPO | Admitting: Speech Pathology

## 2023-07-18 ENCOUNTER — Encounter: Payer: Medicare PPO | Admitting: Occupational Therapy

## 2023-07-18 ENCOUNTER — Ambulatory Visit: Payer: Medicare PPO

## 2023-07-19 ENCOUNTER — Other Ambulatory Visit: Payer: Self-pay | Admitting: Family Medicine

## 2023-07-19 DIAGNOSIS — K449 Diaphragmatic hernia without obstruction or gangrene: Secondary | ICD-10-CM

## 2023-07-19 DIAGNOSIS — Z8719 Personal history of other diseases of the digestive system: Secondary | ICD-10-CM

## 2023-07-20 ENCOUNTER — Encounter: Payer: Medicare PPO | Admitting: Speech Pathology

## 2023-07-20 ENCOUNTER — Ambulatory Visit: Payer: Medicare PPO

## 2023-07-25 ENCOUNTER — Ambulatory Visit: Payer: Medicare PPO

## 2023-07-25 ENCOUNTER — Encounter: Payer: Medicare PPO | Admitting: Speech Pathology

## 2023-07-27 ENCOUNTER — Encounter: Payer: Medicare PPO | Admitting: Speech Pathology

## 2023-07-27 ENCOUNTER — Ambulatory Visit: Payer: Medicare PPO

## 2023-11-11 ENCOUNTER — Other Ambulatory Visit: Payer: Self-pay | Admitting: Registered Nurse

## 2024-01-10 NOTE — Progress Notes (Signed)
 Per DW WQ, No DW Mod PA CCM NON-DW  Ordering User: Sharlene Terrall Norris, MD    Auth Provider: SHARLENE TERRALL NORRIS Enc Provider: Sharlene Terrall Norris, MD  Cosign Provider:   Department: [facility]  Diagnosis: Moderate late onset Alzheimer's dementia with agitation (CMS/HHS-HCC) Caregiver stress    Sched Instruct:    Comment:    CPT Code: [id number]    Order [place] (CPT): [facility] CAREMANAGER CARE MANAGEMENT LATCH    Order Specific Questions  Question Answer Comment  [facility] Team Requested Care Management    Care Management Disease-specific education Social drivers of health (e.g. transit, food access, housing) Behavioral health support    Disease States (select all that apply) Dementia    Does the patient have a caregiver? Yes    Clinical Concerns/Questions: support for wife who is caring for this patient with dementia      [facility] - Patient Enrolled  This serves as confirmation that your patient, Clayton [name], has enrolled in [place].  Clayton [name] was identified by provider referral as a candidate for [facility] Complex Care Management service(s).  An appointment has been scheduled for [facility] on 01/22/24 with a [facility] Manager, Clayton [name]. Clayton [name]    [address]; [place], [place] [address] l  [email or web address] l [phone]   For more information on [facility] services, click here.

## 2024-01-16 ENCOUNTER — Emergency Department

## 2024-01-16 ENCOUNTER — Encounter: Payer: Self-pay | Admitting: Emergency Medicine

## 2024-01-16 ENCOUNTER — Other Ambulatory Visit: Payer: Self-pay

## 2024-01-16 ENCOUNTER — Emergency Department
Admission: EM | Admit: 2024-01-16 | Discharge: 2024-01-16 | Disposition: A | Discharge status: Home / Self Care | Attending: Emergency Medicine | Admitting: Emergency Medicine

## 2024-01-16 ENCOUNTER — Ambulatory Visit
Admission: EM | Admit: 2024-01-16 | Discharge: 2024-01-16 | Attending: Emergency Medicine | Admitting: Emergency Medicine

## 2024-01-16 DIAGNOSIS — R531 Weakness: Secondary | ICD-10-CM | POA: Diagnosis not present

## 2024-01-16 DIAGNOSIS — Z8673 Personal history of transient ischemic attack (TIA), and cerebral infarction without residual deficits: Secondary | ICD-10-CM

## 2024-01-16 DIAGNOSIS — R296 Repeated falls: Secondary | ICD-10-CM

## 2024-01-16 DIAGNOSIS — I6782 Cerebral ischemia: Secondary | ICD-10-CM | POA: Diagnosis not present

## 2024-01-16 DIAGNOSIS — N189 Chronic kidney disease, unspecified: Secondary | ICD-10-CM | POA: Diagnosis not present

## 2024-01-16 DIAGNOSIS — I129 Hypertensive chronic kidney disease with stage 1 through stage 4 chronic kidney disease, or unspecified chronic kidney disease: Secondary | ICD-10-CM | POA: Insufficient documentation

## 2024-01-16 DIAGNOSIS — R079 Chest pain, unspecified: Secondary | ICD-10-CM

## 2024-01-16 DIAGNOSIS — Z86718 Personal history of other venous thrombosis and embolism: Secondary | ICD-10-CM | POA: Insufficient documentation

## 2024-01-16 DIAGNOSIS — M79674 Pain in right toe(s): Secondary | ICD-10-CM

## 2024-01-16 DIAGNOSIS — R410 Disorientation, unspecified: Secondary | ICD-10-CM | POA: Diagnosis not present

## 2024-01-16 DIAGNOSIS — R0781 Pleurodynia: Secondary | ICD-10-CM | POA: Diagnosis not present

## 2024-01-16 DIAGNOSIS — G309 Alzheimer's disease, unspecified: Secondary | ICD-10-CM | POA: Diagnosis not present

## 2024-01-16 DIAGNOSIS — R0789 Other chest pain: Secondary | ICD-10-CM

## 2024-01-16 LAB — CBC
HCT: 44.4 % (ref 39.0–52.0)
Hemoglobin: 14.6 g/dL (ref 13.0–17.0)
MCH: 31.7 pg (ref 26.0–34.0)
MCHC: 32.9 g/dL (ref 30.0–36.0)
MCV: 96.5 fL (ref 80.0–100.0)
Platelets: 273 K/uL (ref 150–400)
RBC: 4.6 MIL/uL (ref 4.22–5.81)
RDW: 13.7 % (ref 11.5–15.5)
WBC: 11.7 K/uL — ABNORMAL HIGH (ref 4.0–10.5)
nRBC: 0 % (ref 0.0–0.2)

## 2024-01-16 LAB — LIPASE, BLOOD: Lipase: 33 U/L (ref 11–51)

## 2024-01-16 LAB — COMPREHENSIVE METABOLIC PANEL WITH GFR
ALT: 27 U/L (ref 0–44)
AST: 29 U/L (ref 15–41)
Albumin: 4.1 g/dL (ref 3.5–5.0)
Alkaline Phosphatase: 70 U/L (ref 38–126)
Anion gap: 11 (ref 5–15)
BUN: 18 mg/dL (ref 8–23)
CO2: 22 mmol/L (ref 22–32)
Calcium: 9.1 mg/dL (ref 8.9–10.3)
Chloride: 105 mmol/L (ref 98–111)
Creatinine, Ser: 1.27 mg/dL — ABNORMAL HIGH (ref 0.61–1.24)
GFR, Estimated: 55 mL/min — ABNORMAL LOW (ref >60)
Glucose, Bld: 94 mg/dL (ref 70–99)
Potassium: 4.3 mmol/L (ref 3.5–5.1)
Sodium: 138 mmol/L (ref 135–145)
Total Bilirubin: 1.1 mg/dL (ref 0.0–1.2)
Total Protein: 7.7 g/dL (ref 6.5–8.1)

## 2024-01-16 LAB — URINALYSIS, ROUTINE W REFLEX MICROSCOPIC
Bilirubin Urine: NEGATIVE
Glucose, UA: NEGATIVE mg/dL
Hgb urine dipstick: NEGATIVE
Ketones, ur: NEGATIVE mg/dL
Leukocytes,Ua: NEGATIVE
Nitrite: NEGATIVE
Protein, ur: NEGATIVE mg/dL
Specific Gravity, Urine: 1.01 (ref 1.005–1.030)
pH: 5 (ref 5.0–8.0)

## 2024-01-16 LAB — TROPONIN I (HIGH SENSITIVITY)
Troponin I (High Sensitivity): 7 ng/L (ref <18)
Troponin I (High Sensitivity): 9 ng/L (ref <18)

## 2024-01-16 LAB — RESP PANEL BY RT-PCR (RSV, FLU A&B, COVID)  RVPGX2
Influenza A by PCR: NEGATIVE
Influenza B by PCR: NEGATIVE
Resp Syncytial Virus by PCR: NEGATIVE
SARS Coronavirus 2 by RT PCR: NEGATIVE

## 2024-01-16 LAB — TSH: TSH: 1.679 u[IU]/mL (ref 0.350–4.500)

## 2024-01-16 MED ORDER — LORAZEPAM 1 MG PO TABS: 1.0000 mg | ORAL_TABLET | Freq: Once | ORAL | Status: DC | PRN

## 2024-01-16 MED ORDER — IBUPROFEN 600 MG PO TABS
600.0000 mg | ORAL_TABLET | Freq: Once | ORAL | Status: AC
  Administered 2024-01-16: 600 mg via ORAL
  Filled 2024-01-16: qty 1

## 2024-01-16 MED ORDER — HYDROCODONE-ACETAMINOPHEN 5-325 MG PO TABS
1.0000 | ORAL_TABLET | Freq: Once | ORAL | Status: AC
  Administered 2024-01-16: 1 via ORAL
  Filled 2024-01-16: qty 1

## 2024-01-16 MED ORDER — LIDOCAINE 5 % EX PTCH
1.0000 | MEDICATED_PATCH | Freq: Once | CUTANEOUS | Status: DC
  Administered 2024-01-16: 1 via TRANSDERMAL
  Filled 2024-01-16: qty 1

## 2024-01-16 MED ORDER — HYDROCODONE-ACETAMINOPHEN 5-325 MG PO TABS: 1.0000 | ORAL_TABLET | Freq: Four times a day (QID) | ORAL | 0 refills | Status: AC | PRN

## 2024-01-16 MED ORDER — LIDOCAINE 5 % EX PTCH: 1.0000 | MEDICATED_PATCH | CUTANEOUS | 0 refills | Status: AC

## 2024-01-16 NOTE — ED Provider Notes (Signed)
 [facility] Provider Note    Event Date/Time   First MD Initiated Contact with Patient 01/16/24 1314     (approximate)   History   Weakness   HPI  Clayton [name] is a 89 y.o. male with a history of CVA with residual gait deficit, GI bleed, CKD, DVT, hypertension, TBI, dementia, hypertension, anemia and peripheral neuropathy who presents with increased generalized weakness, confusion, and multiple falls.  The wife reports that he has had 3 falls in the last 2 days which is unusual for him.  He states that he does not remember how he falls, he just ends up on the floor.  He denies feeling dizzy or lightheaded.  The patient has had some intermittent bilateral rib and upper back pain.  He denies any shortness of breath.  He has no cough or fever.  He denies any vomiting or diarrhea.  I reviewed the past medical records.  The patient was seen at urgent care today and recommended for transfer to the ED for imaging and further workup.  Previously, his most recent outpatient encounter in our system was on 7/25 with geriatric medicine for evaluation of his dementia.   Physical Exam   Triage Vital Signs: ED Triage Vitals  Encounter Vitals Group     BP 01/16/24 1056 (!) 144/71     Girls Systolic BP Percentile --      Girls Diastolic BP Percentile --      Boys Systolic BP Percentile --      Boys Diastolic BP Percentile --      Pulse Rate 01/16/24 1056 61     Resp 01/16/24 1056 16     Temp 01/16/24 1057 97.9 F (36.6 C)     Temp Source 01/16/24 1056 Oral     SpO2 01/16/24 1056 96 %     Weight --      Height --      Head Circumference --      Peak Flow --      Pain Score --      Pain Loc --      Pain Education --      Exclude from Growth Chart --     Most recent vital signs: Vitals:   01/16/24 1057 01/16/24 1330  BP:  (!) 157/69  Pulse:  (!) 55  Resp:  (!) 23  Temp: 97.9 F (36.6 C)   SpO2:  100%     General: Alert, oriented x 2, no distress.   CV:  Good peripheral perfusion.  Resp:  Normal effort.  Bilateral mild lower rib tenderness. Abd:  No distention.  Other:  EOMI.  PERRLA.  No photophobia.  No facial droop.  Normal speech.  Motor intact in all extremities.  No ataxia.  Mild right posterior rib tenderness with no step-off or crepitus.  No midline spinal tenderness.   ED Results / Procedures / Treatments   Labs (all labs ordered are listed, but only abnormal results are displayed) Labs Reviewed  COMPREHENSIVE METABOLIC PANEL WITH GFR - Abnormal; Notable for the following components:      Result Value   Creatinine, Ser 1.27 (*)    GFR, Estimated 55 (*)    All other components within normal limits  CBC - Abnormal; Notable for the following components:   WBC 11.7 (*)    All other components within normal limits  RESP PANEL BY RT-PCR (RSV, FLU A&B, COVID)  RVPGX2  URINALYSIS, ROUTINE W REFLEX MICROSCOPIC  TSH  LIPASE, BLOOD  CBG MONITORING, ED  TROPONIN I (HIGH SENSITIVITY)  TROPONIN I (HIGH SENSITIVITY)     EKG  ED ECG REPORT I, Waylon Cassis, the attending physician, personally viewed and interpreted this ECG.  Date: 01/16/2024 EKG Time: 1102 Rate: 60 Rhythm: normal sinus rhythm QRS Axis: normal Intervals: normal ST/T Wave abnormalities: normal Narrative Interpretation: no evidence of acute ischemia    RADIOLOGY  CT head: I independently viewed and interpreted the images; there is no ICH.  Radiology report indicates no acute abnormality.  PROCEDURES:  Critical Care performed: No  Procedures   MEDICATIONS ORDERED IN ED: Medications  LORazepam  (ATIVAN ) tablet 1 mg (has no administration in time range)     IMPRESSION / MDM / ASSESSMENT AND PLAN / ED COURSE  I reviewed the triage vital signs and the nursing notes.  89 year old male with PMH as noted above presents with increased generalized weakness, multiple falls, as well as a cognitive decline over the last month.  On exam the  patient is alert, oriented x 2, with a nonfocal neurologic exam.  Differential diagnosis includes, but is not limited to, advancing dementia, CVA, NPH or other CNS etiology, UTI or other infection, hypothyroidism, electrolyte abnormality, other metabolic cause.  CT head was obtained from triage and is negative for acute findings.  CBC and CMP are unremarkable.  I have added on an MRI, x-rays of the ribs, respiratory panel, TSH, troponin, and urinalysis for further workup.  Patient's presentation is most consistent with acute complicated illness / injury requiring diagnostic workup.  ----------------------------------------- 3:20 PM on 01/16/2024 -----------------------------------------  Rib x-rays are negative.  MRI brain is pending.  Additional lab workup including troponin, TSH, and urinalysis is also pending.  I anticipate that if this workup is entirely negative, the patient may be appropriate for discharge home.  I have signed the patient out to oncoming ED physician Dr. Levander.  FINAL CLINICAL IMPRESSION(S) / ED DIAGNOSES   Final diagnoses:  Generalized weakness  Multiple falls     Rx / DC Orders   ED Discharge Orders     None        Note:  This document was prepared using Dragon voice recognition software and may include unintentional dictation errors.[place]EN[place]OD Cassis Waylon, MD 01/16/24 1529

## 2024-01-16 NOTE — Discharge Instructions (Addendum)
 You were seen in the ER today for evaluation after your recent falls with associated pain over your chest wall.  Your testing was fortunately overall reassuring.  I have sent a prescription for a short course of pain medication as well as lidocaine  patches to your pharmacy.  You can also purchase lidocaine  patches over-the-counter if this is cheaper.  Follow with your primary care doctor for further evaluation.  Return to the ER for new or worsening symptoms.

## 2024-01-16 NOTE — ED Provider Notes (Signed)
 HPI  SUBJECTIVE:  Clayton [name] is a 89 y.o. male who presents with 3 falls over the past 2 days.  Patient does not remember how or why he fell.  Last fall was earlier this morning.  He is complaining of left elbow pain, back pain, bilateral anterior lower rib pain today, and pain with inspiration.  He does not remember hitting his head.  Denies headache, loss of consciousness, nausea, vomiting, chest pressure or heaviness, shortness of breath, palpitations.  No known syncope causing the fall.  Damien member states that he is more confused than usual, but is currently at his baseline mental status.  No abdominal, urinary complaints, patient denies new unilateral arm/leg/face numbness/tingling/weakness.  Family member reports worsening discoordination.  Some decreased p.o. intake over the past week.  No recent diarrhea, vomiting.  No change in medications.  Family member states that he has been sleeping more over the past week.  Patient has a past medical history of CVA with residual gait deficits, GI bleed, chronic kidney disease, right lower extremity DVT, hypertension, TBI with skull fracture, dementia, hypertension, anemia, on Plavix , hypokalemia/hyponatremia, peripheral neuropathy, left visual field deficits.  History primarily obtained from family member  Past Medical History:  Diagnosis Date   Acute gastrointestinal hemorrhage    Acute hypoxemic respiratory failure (HCC) 05/25/2022   Acute thrombotic stroke (HCC) 12/14/2022   Basal cell carcinoma    Blood in stool 08/30/2018   Blood transfusion without reported diagnosis 50 (age 15)   After traumatic brain injury   Broken ribs    2 BROKEN ON RIBS   CKD (chronic kidney disease)    PT DENIES   Color blindness    DVT (deep venous thrombosis) (HCC)    RIGHT LEG   Dyslexia    Fracture dislocation of wrist    RIGHT   GI bleed 09/02/2018   Hiatal hernia with GERD    HTN (hypertension)    Hypokalemia 05/26/2022   Hyponatremia  05/25/2022   Symptomatic anemia 09/03/2018   TBI (traumatic brain injury) (HCC)    w/skull Fx and crani in 1960s    Past Surgical History:  Procedure Laterality Date   COLONOSCOPY  2014   cleared for 5 yrs   COLONOSCOPY N/A 09/01/2018   Procedure: COLONOSCOPY;  Surgeon: Janalyn Keene NOVAK, MD;  Location: [facility] ENDOSCOPY;  Service: Endoscopy;  Laterality: N/A;   CRANIECTOMY FOR DEPRESSED SKULL FRACTURE  1960'S   ESOPHAGOGASTRODUODENOSCOPY N/A 09/01/2018   Procedure: ESOPHAGOGASTRODUODENOSCOPY (EGD);  Surgeon: Janalyn Keene NOVAK, MD;  Location: [facility] ENDOSCOPY;  Service: Endoscopy;  Laterality: N/A;   HERNIA REPAIR     X2   IVC FILTER REMOVAL N/A 07/03/2017   Procedure: IVC FILTER REMOVAL;  Surgeon: Marea Selinda RAMAN, MD;  Location: [facility] INVASIVE CV LAB;  Service: Cardiovascular;  Laterality: N/A;   LUMBAR LAMINECTOMY/DECOMPRESSION MICRODISCECTOMY N/A 04/20/2020   Procedure: L2/3, L3/4 LAMINECTOMY;  Surgeon: Bluford Standing, MD;  Location: [facility] ORS;  Service: Neurosurgery;  Laterality: N/A;  2ND CASE   PERIPHERAL VASCULAR THROMBECTOMY Right 05/22/2017   Procedure: PERIPHERAL VASCULAR THROMBECTOMY;  Surgeon: Marea Selinda RAMAN, MD;  Location: [facility] INVASIVE CV LAB;  Service: Cardiovascular;  Laterality: Right;   SPINE SURGERY  December 2021   TONSILLECTOMY      Family History  Problem Relation Age of Onset   Heart disease Father     Social History   Tobacco Use   Smoking status: Former    Current packs/day: 0.00    Average packs/day: 0.5 packs/day  for 3.5 years (1.7 ttl pk-yrs)    Types: Cigarettes    Start date: 07/03/1955    Quit date: 01/01/1959    Years since quitting: 65.0   Smokeless tobacco: Never   Tobacco comments:    I smoked from about age 77 to 42.  Vaping Use   Vaping status: Never Used  Substance Use Topics   Alcohol use: Yes    Alcohol/week: 3.0 standard drinks of alcohol    Types: 3 Glasses of wine per week    Comment: once a week   Drug use: No    No current  facility-administered medications for this encounter.  Current Outpatient Medications:    acetaminophen  (TYLENOL ) 325 MG tablet, Take 1-2 tablets (325-650 mg total) by mouth every 4 (four) hours as needed for mild pain., Disp: , Rfl:    atorvastatin  (LIPITOR) 40 MG tablet, Take 1 tablet (40 mg total) by mouth daily., Disp: 30 tablet, Rfl: 0   busPIRone  (BUSPAR ) 7.5 MG tablet, Take 1 tablet by mouth 2 (two) times daily., Disp: , Rfl:    Cetirizine HCl (ZYRTEC PO), Take by mouth., Disp: , Rfl:    clopidogrel  (PLAVIX ) 75 MG tablet, Take 1 tablet (75 mg total) by mouth daily., Disp: 30 tablet, Rfl: 0   melatonin 5 MG TABS, Take 1 tablet (5 mg total) by mouth at bedtime., Disp: 30 tablet, Rfl: 0   Melatonin-Pyridoxine 5-1 MG TABS, Take by mouth., Disp: , Rfl:    pantoprazole  (PROTONIX ) 40 MG tablet, Take 1 tablet (40 mg total) by mouth daily., Disp: 90 tablet, Rfl: 1   Pediatric Multivitamins-Fl (MULTI VIT/FL PO), Take by mouth., Disp: , Rfl:    QUEtiapine  (SEROQUEL ) 50 MG tablet, Take 1 tablet (50 mg total) by mouth at bedtime as needed (agitation, insomnia)., Disp: 30 tablet, Rfl: 0   sertraline (ZOLOFT) 25 MG tablet, Take 25 mg by mouth daily., Disp: , Rfl:    traZODone  (DESYREL ) 50 MG tablet, Take 1 tablet (50 mg total) by mouth at bedtime as needed for sleep. (Patient not taking: Reported on 01/30/2023), Disp: 30 tablet, Rfl: 0   vitamin B-12 (CYANOCOBALAMIN ) 500 MCG tablet, Take 1 tablet (500 mcg total) by mouth daily., Disp: , Rfl:   Allergies  Allergen Reactions   Aspirin Rash   Sulfa Antibiotics Rash     ROS  As noted in HPI.   Physical Exam  BP 118/80 (BP Location: Right Arm)   Pulse 60   Temp 97.7 F (36.5 C) (Oral)   Resp 15   SpO2 99%   Constitutional: Well developed, well nourished, no acute distress Eyes: PERRL, EOMI, conjunctiva normal bilaterally.  No photophobia. HENT: Normocephalic, atraumatic,mucus membranes moist Respiratory: Clear to auscultation bilaterally,  no rales, no wheezing, no rhonchi.  Positive right sided chest wall tenderness.  No lower anterior rib tenderness bilaterally. Cardiovascular: Normal rate and rhythm, no murmurs, no gallops, no rubs GI: Soft, nondistended, normal bowel sounds, nontender, no rebound, no guarding skin: No rash, skin intact Musculoskeletal: No edema, no tenderness, no deformities Neurologic: Alert & oriented x 3, GCS 15, CN III-XII grossly intact, ataxic left finger/nose.  Coordination right finger/nose, heel/shin smooth.  Sensation grossly intact.  Moving all extremities equally. Psychiatric: Speech and behavior appropriate   ED Course   Medications - No data to display  Orders Placed This Encounter  Procedures   ED EKG    Standing Status:   Standing    Number of Occurrences:   1  Reason for Exam:   Chest Pain   No results found for this or any previous visit (from the past 24 hours). No results found.  ED Clinical Impression  1. Multiple falls   2. Chest pain, unspecified type   3. History of stroke      ED Assessment/Plan     Patient started complaining of dull left-sided nonreproducible lower chest pain while in department, obtained EKG.  EKG: Normal sinus rhythm, rate 61.  Left axis deviation.  First-degree block.  No hypertrophy.  No ST-T wave changes.  No change compared to EKG from 12/2022  Discussed with patient and family member that the number of falls over the past few days is concerning, and could be caused by a number of different things including recurrent stroke, acute renal failure, electrolyte derangements, urinary tract infection, arrhythmia dehydration.  I am concerned that he may have sustained another head injury and is unable to tell us .  I am also concerned that he has broken some ribs.  We do not have CT available here.  Transferring to the [facility] ED for comprehensive evaluation and further management.  He is stable to go by private vehicle.  Discussed rationale for  transfer to the emergency department with patient and family member.  They agree to go.  No orders of the defined types were placed in this encounter.     *This clinic note was created using Dragon dictation software. Therefore, there may be occasional mistakes despite careful proofreading. ?    Van Knee, MD 01/16/24 [id number]

## 2024-01-16 NOTE — Discharge Instructions (Addendum)
 His EKG is normal.  I suspect that he has broken some ribs.  I am transferring to the emergency department to identify the cause of his multiple falls over the past few days.  This could be a urinary tract infection, new stroke, a problem with his kidneys, electrolyte imbalance or a number of other things.  Let them know if his pain changes or gets worse.

## 2024-01-16 NOTE — ED Provider Notes (Signed)
 Care of this patient assumed from prior physician at 1500 pending MRI, additional labs, and disposition. Please see prior physician note for further details.   Briefly, this is an 89 year old male with history of CVA, dementia who presents with 3 falls over the past few days.  Patient denies preceding chest pain, shortness of breath, lightheadedness prior to these.  Denies associated syncope.  Labs without critical derangements.  Urine without evidence of infection. Negative respiratory panel. CT head without acute bleed. Rib series without rib fracture, pneumothorax, other acute abnormality.  MRI brain resulted without acute infarct.  Negative initial troponin, TSH.    Will obtain repeat troponin.  On reevaluation, patient also complaining of right toe pain.  X-Tierney Behl ordered.  Discussed results of workup with patient and family.  Patient does prefer to be discharged home which I do think is reasonable given his overall reassuring workup.  Suspect likely component of musculoskeletal strain/use rib given his chest wall tenderness.  Will plan for DC with short course of narcotic pain medicine, lidocaine  patches.  X-Pavielle Biggar without acute fracture.  Second troponin negative.  Do think patient is stable for discharge.  Strict return precautions provided.  Patient discharged in stable condition.         Levander Slate, MD 01/16/24 1910

## 2024-01-16 NOTE — ED Notes (Signed)
 Patient is being discharged from the Urgent Care and sent to the Emergency Department via POV . Per Dr. Van, patient is in need of higher level of care due to multiple falls, rib cage pain. Patient is aware and verbalizes understanding of plan of care.  Vitals:   01/16/24 0841 01/16/24 0843  BP: 118/80   Pulse: 60   Resp: 15   Temp:  97.7 F (36.5 C)  SpO2: 99%

## 2024-01-16 NOTE — ED Notes (Signed)
 Pt spouse states that pt has had multiple falls over the last few days (with no injuries to head/legs), pt's voice is softer and higher recently. Wife reports pt's feet turning out more than usual. Per wife pt has been sleeping more during the day which is out of the ordinary. Pt has been more stationary in the last 4-5 days.Pt had a medication change recently, and is less restless at night. Pt is eating less over time, not hydrating real well. Denies burning/pain when urinating. Pt is alert to self only.

## 2024-01-16 NOTE — ED Triage Notes (Signed)
 Pt to ED via POV from UC. Wife reports pt has fallen 3 times in the last 48hrs. Wife reports increased weakness. Pt has injury to left elbow and left rib cage. Hx of CVA and alzheimer.

## 2024-01-16 NOTE — ED Triage Notes (Signed)
 Pt presents with his wife who states he has alzheimer and a stroke last year. Since then he has been unsteady on his feet. He had 3 unwitnessed falls in the past 2 days with no known cause of falling. He has injured left elbow, and ribs. During triage patient is grabbing his ribs and grunting.

## 2024-01-22 NOTE — Progress Notes (Signed)
 [facility]  Complex  Care Management - Initial Engagement  Purpose: Patient chart reviewed. Patient's rights and responsibilities were discussed with patient and/or caregiver. A [facility] Care Manager recently completed a phone call with caretaker . Clayton [name] was identified by provider referral as a candidate for [facility] Complex Care Management service(s). Clayton [name] will be managed for the following: Declined. Reports from Patient: The caretaker reports patient has been falling ad she is focusing on getting him evaluated and don't have time to add [facility] on the list of things she is doing right now. Assessment and Intervention Summary of Needs:    * No data to display         [facility] assessment not applicable.  Plan:  Clayton [name] has agreed to participate with the identified care plan that was discussed on 01/22/2024. Please see Plan of Care in the Notes section of Chart Review Next follow up appointment date: N/A     * No data to display          [facility]    [address]; [place], [place] [address] l  [email or web address] l [phone]   For more information on [facility] services, click here.                  *Some images could not be shown.

## 2024-01-24 ENCOUNTER — Encounter: Payer: Self-pay | Admitting: Internal Medicine

## 2024-01-24 ENCOUNTER — Other Ambulatory Visit: Payer: Self-pay

## 2024-01-24 ENCOUNTER — Observation Stay
Admission: EM | Admit: 2024-01-24 | Discharge: 2024-01-25 | Disposition: A | Discharge status: Home / Self Care | Attending: Internal Medicine | Admitting: Internal Medicine

## 2024-01-24 DIAGNOSIS — K921 Melena: Secondary | ICD-10-CM | POA: Diagnosis present

## 2024-01-24 DIAGNOSIS — F0390 Unspecified dementia without behavioral disturbance: Secondary | ICD-10-CM | POA: Insufficient documentation

## 2024-01-24 DIAGNOSIS — G8929 Other chronic pain: Secondary | ICD-10-CM | POA: Insufficient documentation

## 2024-01-24 DIAGNOSIS — Z8719 Personal history of other diseases of the digestive system: Secondary | ICD-10-CM

## 2024-01-24 DIAGNOSIS — K922 Gastrointestinal hemorrhage, unspecified: Principal | ICD-10-CM

## 2024-01-24 DIAGNOSIS — Z8673 Personal history of transient ischemic attack (TIA), and cerebral infarction without residual deficits: Secondary | ICD-10-CM | POA: Insufficient documentation

## 2024-01-24 DIAGNOSIS — Z23 Encounter for immunization: Secondary | ICD-10-CM | POA: Diagnosis not present

## 2024-01-24 DIAGNOSIS — N1831 Chronic kidney disease, stage 3a: Secondary | ICD-10-CM | POA: Diagnosis not present

## 2024-01-24 DIAGNOSIS — Z86718 Personal history of other venous thrombosis and embolism: Secondary | ICD-10-CM | POA: Insufficient documentation

## 2024-01-24 DIAGNOSIS — R001 Bradycardia, unspecified: Secondary | ICD-10-CM | POA: Diagnosis not present

## 2024-01-24 DIAGNOSIS — K625 Hemorrhage of anus and rectum: Secondary | ICD-10-CM | POA: Diagnosis present

## 2024-01-24 DIAGNOSIS — M549 Dorsalgia, unspecified: Secondary | ICD-10-CM | POA: Diagnosis not present

## 2024-01-24 DIAGNOSIS — R296 Repeated falls: Secondary | ICD-10-CM | POA: Insufficient documentation

## 2024-01-24 DIAGNOSIS — I129 Hypertensive chronic kidney disease with stage 1 through stage 4 chronic kidney disease, or unspecified chronic kidney disease: Secondary | ICD-10-CM | POA: Insufficient documentation

## 2024-01-24 DIAGNOSIS — Z85828 Personal history of other malignant neoplasm of skin: Secondary | ICD-10-CM | POA: Diagnosis not present

## 2024-01-24 DIAGNOSIS — I10 Essential (primary) hypertension: Secondary | ICD-10-CM | POA: Diagnosis present

## 2024-01-24 LAB — CBC
HCT: 35 % — ABNORMAL LOW (ref 39.0–52.0)
Hemoglobin: 11.4 g/dL — ABNORMAL LOW (ref 13.0–17.0)
MCH: 31.2 pg (ref 26.0–34.0)
MCHC: 32.6 g/dL (ref 30.0–36.0)
MCV: 95.9 fL (ref 80.0–100.0)
Platelets: 279 K/uL (ref 150–400)
RBC: 3.65 MIL/uL — ABNORMAL LOW (ref 4.22–5.81)
RDW: 13.6 % (ref 11.5–15.5)
WBC: 8.6 K/uL (ref 4.0–10.5)
nRBC: 0 % (ref 0.0–0.2)

## 2024-01-24 LAB — COMPREHENSIVE METABOLIC PANEL WITH GFR
ALT: 27 U/L (ref 0–44)
AST: 28 U/L (ref 15–41)
Albumin: 3.3 g/dL — ABNORMAL LOW (ref 3.5–5.0)
Alkaline Phosphatase: 54 U/L (ref 38–126)
Anion gap: 13 (ref 5–15)
BUN: 20 mg/dL (ref 8–23)
CO2: 21 mmol/L — ABNORMAL LOW (ref 22–32)
Calcium: 8.3 mg/dL — ABNORMAL LOW (ref 8.9–10.3)
Chloride: 108 mmol/L (ref 98–111)
Creatinine, Ser: 1.3 mg/dL — ABNORMAL HIGH (ref 0.61–1.24)
GFR, Estimated: 54 mL/min — ABNORMAL LOW (ref >60)
Glucose, Bld: 98 mg/dL (ref 70–99)
Potassium: 4 mmol/L (ref 3.5–5.1)
Sodium: 142 mmol/L (ref 135–145)
Total Bilirubin: 0.6 mg/dL (ref 0.0–1.2)
Total Protein: 6.2 g/dL — ABNORMAL LOW (ref 6.5–8.1)

## 2024-01-24 LAB — CBC WITH DIFFERENTIAL/PLATELET
Abs Immature Granulocytes: 0.03 K/uL (ref 0.00–0.07)
Basophils Absolute: 0.1 K/uL (ref 0.0–0.1)
Basophils Relative: 1 %
Eosinophils Absolute: 0.1 K/uL (ref 0.0–0.5)
Eosinophils Relative: 2 %
HCT: 35.1 % — ABNORMAL LOW (ref 39.0–52.0)
Hemoglobin: 11.7 g/dL — ABNORMAL LOW (ref 13.0–17.0)
Immature Granulocytes: 0 %
Lymphocytes Relative: 27 %
Lymphs Abs: 2.3 K/uL (ref 0.7–4.0)
MCH: 32.5 pg (ref 26.0–34.0)
MCHC: 33.3 g/dL (ref 30.0–36.0)
MCV: 97.5 fL (ref 80.0–100.0)
Monocytes Absolute: 1.1 K/uL — ABNORMAL HIGH (ref 0.1–1.0)
Monocytes Relative: 12 %
Neutro Abs: 4.9 K/uL (ref 1.7–7.7)
Neutrophils Relative %: 58 %
Platelets: 260 K/uL (ref 150–400)
RBC: 3.6 MIL/uL — ABNORMAL LOW (ref 4.22–5.81)
RDW: 13.5 % (ref 11.5–15.5)
WBC: 8.6 K/uL (ref 4.0–10.5)
nRBC: 0 % (ref 0.0–0.2)

## 2024-01-24 LAB — PROTIME-INR
INR: 1.1 (ref 0.8–1.2)
Prothrombin Time: 15.2 s (ref 11.4–15.2)

## 2024-01-24 LAB — HEMOGLOBIN
Hemoglobin: 10.7 g/dL — ABNORMAL LOW (ref 13.0–17.0)
Hemoglobin: 11 g/dL — ABNORMAL LOW (ref 13.0–17.0)

## 2024-01-24 MED ORDER — PANTOPRAZOLE SODIUM 40 MG IV SOLR
40.0000 mg | Freq: Two times a day (BID) | INTRAVENOUS | Status: DC
  Administered 2024-01-24 – 2024-01-25 (×3): 40 mg via INTRAVENOUS
  Filled 2024-01-24 (×3): qty 10

## 2024-01-24 MED ORDER — LACTATED RINGERS IV SOLN: INTRAVENOUS | Status: AC

## 2024-01-24 MED ORDER — BUSPIRONE HCL 15 MG PO TABS
7.5000 mg | ORAL_TABLET | Freq: Two times a day (BID) | ORAL | Status: DC
Start: 2024-01-24 — End: 2024-01-25
  Administered 2024-01-24 – 2024-01-25 (×3): 7.5 mg via ORAL
  Filled 2024-01-24 (×3): qty 1

## 2024-01-24 MED ORDER — QUETIAPINE FUMARATE 25 MG PO TABS
50.0000 mg | ORAL_TABLET | Freq: Every evening | ORAL | Status: DC | PRN
Start: 2024-01-24 — End: 2024-01-25
  Administered 2024-01-24: 50 mg via ORAL
  Filled 2024-01-24: qty 2

## 2024-01-24 MED ORDER — ONDANSETRON HCL 4 MG PO TABS: 4.0000 mg | ORAL_TABLET | Freq: Four times a day (QID) | ORAL | Status: DC | PRN

## 2024-01-24 MED ORDER — ACETAMINOPHEN 325 MG PO TABS: 650.0000 mg | ORAL_TABLET | Freq: Four times a day (QID) | ORAL | Status: DC | PRN

## 2024-01-24 MED ORDER — INFLUENZA VAC SPLIT HIGH-DOSE 0.5 ML IM SUSY
0.5000 mL | PREFILLED_SYRINGE | INTRAMUSCULAR | Status: AC
Start: 2024-01-25 — End: 2024-01-25
  Administered 2024-01-25: 0.5 mL via INTRAMUSCULAR
  Filled 2024-01-24: qty 0.5

## 2024-01-24 MED ORDER — ACETAMINOPHEN 650 MG RE SUPP: 650.0000 mg | Freq: Four times a day (QID) | RECTAL | Status: DC | PRN

## 2024-01-24 MED ORDER — PNEUMOCOCCAL 20-VAL CONJ VACC 0.5 ML IM SUSY
0.5000 mL | PREFILLED_SYRINGE | INTRAMUSCULAR | Status: AC
Start: 2024-01-25 — End: 2024-01-25
  Administered 2024-01-25: 0.5 mL via INTRAMUSCULAR
  Filled 2024-01-24: qty 0.5

## 2024-01-24 MED ORDER — SERTRALINE HCL 50 MG PO TABS
25.0000 mg | ORAL_TABLET | Freq: Every day | ORAL | Status: DC
  Administered 2024-01-24 – 2024-01-25 (×2): 25 mg via ORAL
  Filled 2024-01-24 (×2): qty 1

## 2024-01-24 MED ORDER — ATORVASTATIN CALCIUM 20 MG PO TABS
40.0000 mg | ORAL_TABLET | Freq: Every day | ORAL | Status: DC
  Administered 2024-01-24 – 2024-01-25 (×2): 40 mg via ORAL
  Filled 2024-01-24 (×2): qty 2

## 2024-01-24 MED ORDER — ONDANSETRON HCL 4 MG/2ML IJ SOLN: 4.0000 mg | Freq: Four times a day (QID) | INTRAMUSCULAR | Status: DC | PRN

## 2024-01-24 MED ORDER — MORPHINE SULFATE (PF) 2 MG/ML IV SOLN: 2.0000 mg | INTRAVENOUS | Status: DC | PRN

## 2024-01-24 NOTE — Assessment & Plan Note (Signed)
 Chronic back pain Patient has fallen 4 times in the past few weeks Continue oxycodone 

## 2024-01-24 NOTE — ED Provider Notes (Signed)
 [facility] Provider Note    Event Date/Time   First MD Initiated Contact with Patient 01/24/24 [phone]     (approximate)   History   Rectal Bleeding   HPI  GARON MELANDER is a 89 y.o. male with history of dementia, prior CVA, prior GI bleed, hypertension, history of DVT, presenting with bright red blood per rectum.  Patient went to the bathroom tonight, noted blood in the stool.  He denies any abdominal pain, no recent trauma or falls.  He denies any lightheadedness, no chest pain or shortness of breath.  No nausea vomiting or diarrhea.  Per independent history from EMS, they did see the bright red blood in the toilet.  EMS does report that he is on a blood thinner but family does not know what he is on.  They were not able to show a pill bottle.  Has had prior GI bleeds in the past.  History obtained from EMS as above.    On independent chart review, he was seen by geriatrics in July, does have history of dementia, he is on Plavix  but do not see any other blood thinning medication on his med list.   Physical Exam   Triage Vital Signs: ED Triage Vitals  Encounter Vitals Group     BP      Girls Systolic BP Percentile      Girls Diastolic BP Percentile      Boys Systolic BP Percentile      Boys Diastolic BP Percentile      Pulse      Resp      Temp      Temp src      SpO2      Weight      Height      Head Circumference      Peak Flow      Pain Score      Pain Loc      Pain Education      Exclude from Growth Chart     Most recent vital signs: Vitals:   01/24/24 0300 01/24/24 0330  BP: (!) 144/86 (!) 140/64  Pulse: (!) 52 (!) 53  Resp: (!) 24 (!) 23  Temp:    SpO2: 100% 98%     General: Awake, no distress.  CV:  Good peripheral perfusion.  Resp:  Normal effort.  Abd:  No distention.  Soft nontender Other:  Rectal exam done with chaperone, he does have external hemorrhoids that are not bleeding, has maroon stool in rectal  vault.   ED Results / Procedures / Treatments   Labs (all labs ordered are listed, but only abnormal results are displayed) Labs Reviewed  COMPREHENSIVE METABOLIC PANEL WITH GFR - Abnormal; Notable for the following components:      Result Value   CO2 21 (*)    Creatinine, Ser 1.30 (*)    Calcium  8.3 (*)    Total Protein 6.2 (*)    Albumin 3.3 (*)    GFR, Estimated 54 (*)    All other components within normal limits  CBC WITH DIFFERENTIAL/PLATELET - Abnormal; Notable for the following components:   RBC 3.60 (*)    Hemoglobin 11.7 (*)    HCT 35.1 (*)    Monocytes Absolute 1.1 (*)    All other components within normal limits  PROTIME-INR  TYPE AND SCREEN     EKG  EKG shows, sinus rhythm, rate 55, normal QRS, normal QTc, T wave  inversion to 3 and aVF as well as V3, no obvious ischemic ST elevation, T wave inversion to aVF and V3 are new compared to prior    PROCEDURES:  Critical Care performed: No  Procedures   MEDICATIONS ORDERED IN ED: Medications - No data to display   IMPRESSION / MDM / ASSESSMENT AND PLAN / ED COURSE  I reviewed the triage vital signs and the nursing notes.                              Differential diagnosis includes, but is not limited to, GI bleed, anemia, mass, AVM.  Will get labs, he will need to be admitted for GI evaluation.  Patient's presentation is most consistent with acute presentation with potential threat to life or bodily function.  Independent interpretation of labs below.  Given bright red blood per rectum, he will need to be admitted for further management.  Held off imaging at this time given that he does not have any abdominal pain.  Consulted hospitalist was agreeable with plan for admission and will evaluate patient.  He is admitted.    Clinical Course as of 01/24/24 0403  Wed Jan 24, 2024  0317 Independent review of labs, no leukocytosis, hemoglobin is downtrending compared to prior, electrolytes not severely  deranged, creatinine is stable, LFTs are normal. [TT]    Clinical Course User Index [TT] Waymond, Lorelle Cummins, MD     FINAL CLINICAL IMPRESSION(S) / ED DIAGNOSES   Final diagnoses:  Gastrointestinal hemorrhage, unspecified gastrointestinal hemorrhage type     Rx / DC Orders   ED Discharge Orders     None        Note:  This document was prepared using Dragon voice recognition software and may include unintentional dictation errors.    Waymond Lorelle Cummins, MD 01/24/24 [phone]

## 2024-01-24 NOTE — Consult Note (Signed)
 Consultation  Referring Provider:    Hospitalist  Admit date: 01/24/2024 Consult date        01/24/2024 Reason for Consultation:   GI bleed           HPI:   Clayton [name] is a 89 y.o. gentleman with history of hypertension and stroke last year. Wife notes that since the stroke he has had a pretty strong decline in his mental status. He has dementia and is currently delirious. He had a presumed diverticular bleed in 2020 with good study and no masses seen. He has been to the ED several times recently for falls. He was discharged from the ED recently with pain meds and had a fall. Was evaluated by his PCP yesterday according to the wife. He noted diarrhea to the wife but it was blood. His last bowel movement was several hours ago. He takes plavix . The wife is extremely distraught due to his mental status.    Past Medical History:  Diagnosis Date   Acute gastrointestinal hemorrhage    Acute hypoxemic respiratory failure (HCC) 05/25/2022   Acute thrombotic stroke (HCC) 12/14/2022   Basal cell carcinoma    Blood in stool 08/30/2018   Blood transfusion without reported diagnosis 59 (age 32)   After traumatic brain injury   Broken ribs    2 BROKEN ON RIBS   CKD (chronic kidney disease)    PT DENIES   Color blindness    DVT (deep venous thrombosis) (HCC)    RIGHT LEG   Dyslexia    Fracture dislocation of wrist    RIGHT   GI bleed 09/02/2018   Hiatal hernia with GERD    HTN (hypertension)    Hypokalemia 05/26/2022   Hyponatremia 05/25/2022   Symptomatic anemia 09/03/2018   TBI (traumatic brain injury) (HCC)    w/skull Fx and crani in 1960s    Past Surgical History:  Procedure Laterality Date   COLONOSCOPY  2014   cleared for 5 yrs   COLONOSCOPY N/A 09/01/2018   Procedure: COLONOSCOPY;  Surgeon: Janalyn Keene NOVAK, MD;  Location: [facility] ENDOSCOPY;  Service: Endoscopy;  Laterality: N/A;   CRANIECTOMY FOR DEPRESSED SKULL FRACTURE  1960'S   ESOPHAGOGASTRODUODENOSCOPY N/A  09/01/2018   Procedure: ESOPHAGOGASTRODUODENOSCOPY (EGD);  Surgeon: Janalyn Keene NOVAK, MD;  Location: [facility] ENDOSCOPY;  Service: Endoscopy;  Laterality: N/A;   HERNIA REPAIR     X2   IVC FILTER REMOVAL N/A 07/03/2017   Procedure: IVC FILTER REMOVAL;  Surgeon: Marea Selinda RAMAN, MD;  Location: [facility] INVASIVE CV LAB;  Service: Cardiovascular;  Laterality: N/A;   LUMBAR LAMINECTOMY/DECOMPRESSION MICRODISCECTOMY N/A 04/20/2020   Procedure: L2/3, L3/4 LAMINECTOMY;  Surgeon: Bluford Standing, MD;  Location: [facility] ORS;  Service: Neurosurgery;  Laterality: N/A;  2ND CASE   PERIPHERAL VASCULAR THROMBECTOMY Right 05/22/2017   Procedure: PERIPHERAL VASCULAR THROMBECTOMY;  Surgeon: Marea Selinda RAMAN, MD;  Location: [facility] INVASIVE CV LAB;  Service: Cardiovascular;  Laterality: Right;   SPINE SURGERY  December 2021   TONSILLECTOMY      Family History  Problem Relation Age of Onset   Heart disease Father     Social History   Tobacco Use   Smoking status: Former    Current packs/day: 0.00    Average packs/day: 0.5 packs/day for 3.5 years (1.7 ttl pk-yrs)    Types: Cigarettes    Start date: 07/03/1955    Quit date: 01/01/1959    Years since quitting: 65.1   Smokeless tobacco: Never   Tobacco comments:  I smoked from about age 45 to 67.  Vaping Use   Vaping status: Never Used  Substance Use Topics   Alcohol use: Yes    Alcohol/week: 3.0 standard drinks of alcohol    Types: 3 Glasses of wine per week    Comment: once a week   Drug use: No    Prior to Admission medications   Medication Sig Start Date End Date Taking? Authorizing Provider  acetaminophen  (TYLENOL ) 325 MG tablet Take 1-2 tablets (325-650 mg total) by mouth every 4 (four) hours as needed for mild pain. 12/29/22   Love, Sharlet RAMAN, PA-C  atorvastatin  (LIPITOR) 40 MG tablet Take 1 tablet (40 mg total) by mouth daily. 12/29/22   Love, Sharlet RAMAN, PA-C  busPIRone  (BUSPAR ) 7.5 MG tablet Take 1 tablet by mouth 2 (two) times daily. 02/13/23   [provider]  Cetirizine HCl (ZYRTEC PO) Take by mouth.    [provider]  clopidogrel  (PLAVIX ) 75 MG tablet Take 1 tablet (75 mg total) by mouth daily. 01/10/23   Debby Fidela CROME, NP  lidocaine  (LIDODERM ) 5 % Place 1 patch onto the skin daily for 10 days. Remove & Discard patch within 12 hours or as directed by MD 01/16/24 01/26/24  Levander Slate, MD  melatonin 5 MG TABS Take 1 tablet (5 mg total) by mouth at bedtime. 12/29/22   Maurice Sharlet RAMAN, PA-C  Melatonin-Pyridoxine 5-1 MG TABS Take by mouth. 12/29/22   [provider]  pantoprazole  (PROTONIX ) 40 MG tablet Take 1 tablet (40 mg total) by mouth daily. 08/04/22   Joshua Cathryne BROCKS, MD  Pediatric Multivitamins-Fl (MULTI VIT/FL PO) Take by mouth.    [provider]  QUEtiapine  (SEROQUEL ) 50 MG tablet Take 1 tablet (50 mg total) by mouth at bedtime as needed (agitation, insomnia). 01/10/23   Debby Fidela CROME, NP  sertraline  (ZOLOFT ) 25 MG tablet Take 25 mg by mouth daily.    [provider]  traZODone  (DESYREL ) 50 MG tablet Take 1 tablet (50 mg total) by mouth at bedtime as needed for sleep. Patient not taking: Reported on 01/30/2023 01/10/23   Debby Fidela CROME, NP  vitamin B-12 (CYANOCOBALAMIN ) 500 MCG tablet Take 1 tablet (500 mcg total) by mouth daily. 12/15/22   Josette Ade, MD    Current Facility-Administered Medications  Medication Dose Route Frequency Provider Last Rate Last Admin   acetaminophen  (TYLENOL ) tablet 650 mg  650 mg Oral Q6H PRN Duncan, Hazel V, MD       Or   acetaminophen  (TYLENOL ) suppository 650 mg  650 mg Rectal Q6H PRN Cleatus Delayne GAILS, MD       atorvastatin  (LIPITOR) tablet 40 mg  40 mg Oral Daily Duncan, Hazel V, MD   40 mg at 01/24/24 9149   busPIRone  (BUSPAR ) tablet 7.5 mg  7.5 mg Oral BID Duncan, Hazel V, MD   7.5 mg at 01/24/24 0850   [START ON 01/25/2024] Influenza vac split trivalent PF (FLUZONE HIGH-DOSE) injection 0.5 mL  0.5 mL Intramuscular Tomorrow-1000 Cleatus Delayne GAILS, MD        morphine  (PF) 2 MG/ML injection 2 mg  2 mg Intravenous Q2H PRN Duncan, Hazel V, MD       ondansetron  (ZOFRAN ) tablet 4 mg  4 mg Oral Q6H PRN Duncan, Hazel V, MD       Or   ondansetron  (ZOFRAN ) injection 4 mg  4 mg Intravenous Q6H PRN Cleatus Delayne GAILS, MD       pantoprazole  (PROTONIX ) injection  40 mg  40 mg Intravenous Q12H Rashid, Farhan, MD   40 mg at 01/24/24 0850   [START ON 01/25/2024] pneumococcal 20-valent conjugate vaccine (PREVNAR 20 ) injection 0.5 mL  0.5 mL Intramuscular Tomorrow-1000 Cleatus Delayne GAILS, MD       QUEtiapine  (SEROQUEL ) tablet 50 mg  50 mg Oral QHS PRN Duncan, Hazel V, MD       sertraline  (ZOLOFT ) tablet 25 mg  25 mg Oral Daily Rashid, Farhan, MD   25 mg at 01/24/24 1428    Allergies as of 01/24/2024 - Review Complete 01/24/2024  Allergen Reaction Noted   Aspirin Rash 06/06/2017   Sulfa antibiotics Rash 04/10/2015     Review of Systems:    All systems reviewed and negative except where noted in HPI.  Unable to assess due to mental status   Physical Exam:  Vital signs in last 24 hours: Temp:  [97.4 F (36.3 C)-98.1 F (36.7 C)] 97.4 F (36.3 C) (09/24 1553) Pulse Rate:  [49-74] 73 (09/24 1553) Resp:  [15-24] 16 (09/24 1553) BP: (112-153)/(60-99) 139/72 (09/24 1553) SpO2:  [92 %-100 %] 99 % (09/24 1553) Weight:  [73.8 kg-76 kg] 73.8 kg (09/24 0631) Last BM Date : 01/24/24 General:   NAD Head:  Normocephalic and atraumatic. Eyes:   No icterus.   Conjunctiva pink. Ears:  Normal auditory acuity. Mouth: Mucosa pink moist, no lesions. Neck:  Supple; no masses felt Lungs:  No respiratory distress  Abdomen:   Flat, soft, nondistended, nontender Msk:  No clubbing or cyanosis. Strength 5/5. Symmetrical without gross deformities. Neurologic:  Alert and  oriented x4;  No focal deficits Skin:  Warm, dry, pink without significant lesions or rashes. Psych:  Delirious  LAB RESULTS: Recent Labs    01/24/24 0237 01/24/24 0803 01/24/24 1412  WBC 8.6  --  8.6  HGB  11.7* 10.7* 11.4*  HCT 35.1*  --  35.0*  PLT 260  --  279   BMET Recent Labs    01/24/24 0237  NA 142  K 4.0  CL 108  CO2 21*  GLUCOSE 98  BUN 20  CREATININE 1.30*  CALCIUM  8.3*   LFT Recent Labs    01/24/24 0237  PROT 6.2*  ALBUMIN 3.3*  AST 28  ALT 27  ALKPHOS 54  BILITOT 0.6   PT/INR Recent Labs    01/24/24 0237  LABPROT 15.2  INR 1.1    STUDIES: No results found.     Impression / Plan:   89 y/o with dementia and history of diverticular bleeding who presents with likely recurrent diverticular bleeding. Patient is currently delirious and hemodynamically stable. Discussed with wife about risks/benefits of any intervention and the risks seem to outweigh any benefits. She is open to actually taking him home due his mental status but understands the risk of recurrent bleeding. Discussed with the hospitalist about the situation.  - no plans for any intervention currently due to mental status, if hemodynamically recurrent bleeding then consider CTA - consider palliative care discussion given the number of recent falls and dementia and for any help at home for the wife - please call with any questions or concerns.  Frederic Schick MD, MPH [facility]

## 2024-01-24 NOTE — Assessment & Plan Note (Addendum)
 Off AC times several years

## 2024-01-24 NOTE — H&P (Signed)
 History and Physical    Patient: Clayton [name] DOB: 03-Jan-1935 DOA: 01/24/2024 DOS: the patient was seen and examined on 01/24/2024 PCP: Trinidad Cassondra BROCKS, MD  Patient coming from: Home  Chief Complaint:  Chief Complaint  Patient presents with   Rectal Bleeding    HPI: Clayton [name] is a 89 y.o. male with medical history significant for Hypertension,  cognitive impairment, CVA, CKD llla, remote history of DVT no longer on [facility], diverticular bleed in 2020(tagged RBC/hemorrhoids on colonoscopy), being admitted with bright red blood per rectum.  He presented from home after he had a large bloody bowel movement.  He denied vomiting, straining, abdominal pain.  Wife at bedside contributes to the history In the ED bradycardic to the low 50s with otherwise normal vitals.Labs notable for hemoglobin 11.7 down from 14.6 a week ago.  Creatinine at baseline at 1.3. Rectal exam revealed maroon stool in rectal vault. Admission requested.     Review of Systems: As mentioned in the history of present illness. All other systems reviewed and are negative.  Past Medical History:  Diagnosis Date   Acute gastrointestinal hemorrhage    Acute hypoxemic respiratory failure (HCC) 05/25/2022   Acute thrombotic stroke (HCC) 12/14/2022   Basal cell carcinoma    Blood in stool 08/30/2018   Blood transfusion without reported diagnosis 35 (age 57)   After traumatic brain injury   Broken ribs    2 BROKEN ON RIBS   CKD (chronic kidney disease)    PT DENIES   Color blindness    DVT (deep venous thrombosis) (HCC)    RIGHT LEG   Dyslexia    Fracture dislocation of wrist    RIGHT   GI bleed 09/02/2018   Hiatal hernia with GERD    HTN (hypertension)    Hypokalemia 05/26/2022   Hyponatremia 05/25/2022   Symptomatic anemia 09/03/2018   TBI (traumatic brain injury) (HCC)    w/skull Fx and crani in 1960s   Past Surgical History:  Procedure Laterality Date   COLONOSCOPY  2014    cleared for 5 yrs   COLONOSCOPY N/A 09/01/2018   Procedure: COLONOSCOPY;  Surgeon: Janalyn Keene NOVAK, MD;  Location: [facility] ENDOSCOPY;  Service: Endoscopy;  Laterality: N/A;   CRANIECTOMY FOR DEPRESSED SKULL FRACTURE  1960'S   ESOPHAGOGASTRODUODENOSCOPY N/A 09/01/2018   Procedure: ESOPHAGOGASTRODUODENOSCOPY (EGD);  Surgeon: Janalyn Keene NOVAK, MD;  Location: [facility] ENDOSCOPY;  Service: Endoscopy;  Laterality: N/A;   HERNIA REPAIR     X2   IVC FILTER REMOVAL N/A 07/03/2017   Procedure: IVC FILTER REMOVAL;  Surgeon: Marea Selinda RAMAN, MD;  Location: [facility] INVASIVE CV LAB;  Service: Cardiovascular;  Laterality: N/A;   LUMBAR LAMINECTOMY/DECOMPRESSION MICRODISCECTOMY N/A 04/20/2020   Procedure: L2/3, L3/4 LAMINECTOMY;  Surgeon: Bluford Standing, MD;  Location: [facility] ORS;  Service: Neurosurgery;  Laterality: N/A;  2ND CASE   PERIPHERAL VASCULAR THROMBECTOMY Right 05/22/2017   Procedure: PERIPHERAL VASCULAR THROMBECTOMY;  Surgeon: Marea Selinda RAMAN, MD;  Location: [facility] INVASIVE CV LAB;  Service: Cardiovascular;  Laterality: Right;   SPINE SURGERY  December 2021   TONSILLECTOMY     Social History:  reports that he quit smoking about 65 years ago. His smoking use included cigarettes. He started smoking about 68 years ago. He has a 1.7 pack-year smoking history. He has never used smokeless tobacco. He reports current alcohol use of about 3.0 standard drinks of alcohol per week. He reports that he does not use drugs.  Allergies  Allergen Reactions  Aspirin Rash   Sulfa Antibiotics Rash    Family History  Problem Relation Age of Onset   Heart disease Father     Prior to Admission medications   Medication Sig Start Date End Date Taking? Authorizing Provider  acetaminophen  (TYLENOL ) 325 MG tablet Take 1-2 tablets (325-650 mg total) by mouth every 4 (four) hours as needed for mild pain. 12/29/22   Love, Sharlet RAMAN, PA-C  atorvastatin  (LIPITOR) 40 MG tablet Take 1 tablet (40 mg total) by mouth daily. 12/29/22    Love, Sharlet RAMAN, PA-C  busPIRone  (BUSPAR ) 7.5 MG tablet Take 1 tablet by mouth 2 (two) times daily. 02/13/23   [provider]  Cetirizine HCl (ZYRTEC PO) Take by mouth.    [provider]  clopidogrel  (PLAVIX ) 75 MG tablet Take 1 tablet (75 mg total) by mouth daily. 01/10/23   Debby Fidela CROME, NP  lidocaine  (LIDODERM ) 5 % Place 1 patch onto the skin daily for 10 days. Remove & Discard patch within 12 hours or as directed by MD 01/16/24 01/26/24  Levander Slate, MD  melatonin 5 MG TABS Take 1 tablet (5 mg total) by mouth at bedtime. 12/29/22   Maurice Sharlet RAMAN, PA-C  Melatonin-Pyridoxine 5-1 MG TABS Take by mouth. 12/29/22   [provider]  pantoprazole  (PROTONIX ) 40 MG tablet Take 1 tablet (40 mg total) by mouth daily. 08/04/22   Joshua Cathryne BROCKS, MD  Pediatric Multivitamins-Fl (MULTI VIT/FL PO) Take by mouth.    [provider]  QUEtiapine  (SEROQUEL ) 50 MG tablet Take 1 tablet (50 mg total) by mouth at bedtime as needed (agitation, insomnia). 01/10/23   Debby Fidela CROME, NP  sertraline  (ZOLOFT ) 25 MG tablet Take 25 mg by mouth daily.    [provider]  traZODone  (DESYREL ) 50 MG tablet Take 1 tablet (50 mg total) by mouth at bedtime as needed for sleep. Patient not taking: Reported on 01/30/2023 01/10/23   Debby Fidela CROME, NP  vitamin B-12 (CYANOCOBALAMIN ) 500 MCG tablet Take 1 tablet (500 mcg total) by mouth daily. 12/15/22   Josette Ade, MD    Physical Exam: Vitals:   01/24/24 0234 01/24/24 0240 01/24/24 0300 01/24/24 0330  BP:  (!) 140/91 (!) 144/86 (!) 140/64  Pulse:  (!) 53 (!) 52 (!) 53  Resp:  17 (!) 24 (!) 23  Temp:      TempSrc:      SpO2:  98% 100% 98%  Weight: 76 kg     Height: 5' 10 (1.778 m)      Physical Exam Vitals and nursing note reviewed.  Constitutional:      General: He is not in acute distress. HENT:     Head: Normocephalic and atraumatic.  Cardiovascular:     Rate and Rhythm: Regular rhythm. Bradycardia present.     Heart  sounds: Normal heart sounds.  Pulmonary:     Effort: Pulmonary effort is normal.     Breath sounds: Normal breath sounds.  Abdominal:     Palpations: Abdomen is soft.     Tenderness: There is no abdominal tenderness.  Neurological:     Mental Status: Mental status is at baseline.     Labs on Admission: I have personally reviewed following labs and imaging studies  CBC: Recent Labs  Lab 01/24/24 0237  WBC 8.6  NEUTROABS 4.9  HGB 11.7*  HCT 35.1*  MCV 97.5  PLT 260   Basic Metabolic Panel: Recent Labs  Lab 01/24/24 0237  NA 142  K  4.0  CL 108  CO2 21*  GLUCOSE 98  BUN 20  CREATININE 1.30*  CALCIUM  8.3*   GFR: Estimated Creatinine Clearance: 42.1 mL/min (A) (by C-G formula based on SCr of 1.3 mg/dL (H)). Liver Function Tests: Recent Labs  Lab 01/24/24 0237  AST 28  ALT 27  ALKPHOS 54  BILITOT 0.6  PROT 6.2*  ALBUMIN 3.3*   No results for input(s): LIPASE, AMYLASE in the last 168 hours. No results for input(s): AMMONIA in the last 168 hours. Coagulation Profile: Recent Labs  Lab 01/24/24 0237  INR 1.1   Cardiac Enzymes: No results for input(s): CKTOTAL, CKMB, CKMBINDEX, TROPONINI in the last 168 hours. BNP (last 3 results) No results for input(s): PROBNP in the last 8760 hours. HbA1C: No results for input(s): HGBA1C in the last 72 hours. CBG: No results for input(s): GLUCAP in the last 168 hours. Lipid Profile: No results for input(s): CHOL, HDL, LDLCALC, TRIG, CHOLHDL, LDLDIRECT in the last 72 hours. Thyroid  Function Tests: No results for input(s): TSH, T4TOTAL, FREET4, T3FREE, THYROIDAB in the last 72 hours. Anemia Panel: No results for input(s): VITAMINB12, FOLATE, FERRITIN, TIBC, IRON , RETICCTPCT in the last 72 hours. Urine analysis:    Component Value Date/Time   COLORURINE YELLOW (A) 01/16/2024 1552   APPEARANCEUR CLEAR (A) 01/16/2024 1552   LABSPEC 1.010 01/16/2024 1552   PHURINE  5.0 01/16/2024 1552   GLUCOSEU NEGATIVE 01/16/2024 1552   HGBUR NEGATIVE 01/16/2024 1552   BILIRUBINUR NEGATIVE 01/16/2024 1552   BILIRUBINUR negative 11/27/2020 0941   KETONESUR NEGATIVE 01/16/2024 1552   PROTEINUR NEGATIVE 01/16/2024 1552   UROBILINOGEN 0.2 11/27/2020 0941   NITRITE NEGATIVE 01/16/2024 1552   LEUKOCYTESUR NEGATIVE 01/16/2024 1552    Radiological Exams on Admission: No results found. Data Reviewed for HPI: Relevant notes from primary care and specialist visits, past discharge summaries as available in EHR, including Care Everywhere. Prior diagnostic testing as pertinent to current admission diagnoses Updated medications and problem lists for reconciliation ED course, including vitals, labs, imaging, treatment and response to treatment Triage notes, nursing and pharmacy notes and ED provider's notes Notable results as noted above in HPI      Assessment and Plan: * Hematochezia Acute blood loss anemia History of lower GI bleed 2020 Hemoglobin 11.7, down from 14.6 a week ago Colonoscopy in 2020 revealed hemorrhoids only but tagged RBC showed bleeding throughout the entire colon Serial H&H and transfuse if needed IV hydration Will hold Plavix  GI consult   Sinus bradycardia Continuous cardiac monitor  History of CVA 2024(cerebrovascular accident) Holding Plavix  Continue atorvastatin   Dementia (HCC) Continue sertraline  and buspirone  Delirium precautions  HTN (hypertension) Hold antihypertensives in the setting of GI bleed  History of DVT (deep vein thrombosis) Off AC times several years  Stage 3a chronic kidney disease (HCC) At baseline  Frequent falls Chronic back pain Patient has fallen 4 times in the past few weeks Continue oxycodone         DVT prophylaxis: SCD  Consults: GI  Advance Care Planning:   Code Status: Prior   Family Communication: Wife at bedside  Disposition Plan: Back to previous home environment  Severity of  Illness: The appropriate patient status for this patient is OBSERVATION. Observation status is judged to be reasonable and necessary in order to provide the required intensity of service to ensure the patient's safety. The patient's presenting symptoms, physical exam findings, and initial radiographic and laboratory data in the context of their medical condition is felt to place them at  decreased risk for further clinical deterioration. Furthermore, it is anticipated that the patient will be medically stable for discharge from the hospital within 2 midnights of admission.   Author: Delayne LULLA Solian, MD 01/24/2024 4:32 AM  For on call review [email or web address].

## 2024-01-24 NOTE — Progress Notes (Signed)
  Progress Note   Patient: Clayton [name] FMW:[id number] DOB: [birth year] DOA: 01/24/2024     0 DOS: the patient was seen and examined on 01/24/2024  Same Day Admission Note:  Patient was seen and examined. Wife was present at the bedside.He had one episode of hematochezia yesterday night. He is on plavix  because of a stroke, held during this hospitalization. He had an episode of GI bleed almost 5 years back. GI consulted. F/u H&H closely. Continue with IV Protonix .  Author: Deliliah Room, MD 01/24/2024 9:28 AM  For on call review [email or web address].

## 2024-01-24 NOTE — Hospital Course (Signed)
 cognitive impairment, history of TBI, hypertension, CVA 2024

## 2024-01-24 NOTE — Assessment & Plan Note (Signed)
 At baseline

## 2024-01-24 NOTE — Assessment & Plan Note (Signed)
Hold antihypertensives in the setting of GI bleed 

## 2024-01-24 NOTE — Evaluation (Signed)
 Occupational Therapy Evaluation Patient Details Name: Clayton [name] MRN: [id number] DOB: Apr 16, 1935 Today's Date: 01/24/2024   History of Present Illness   is a 89 y.o. male with medical history significant for Hypertension,  cognitive impairment, CVA, CKD llla, remote history of DVT no longer on AC, diverticular bleed in 2020(tagged RBC/hemorrhoids on colonoscopy), being admitted with bright red blood per rectum.   Clinical Impressions Clayton [name] was seen for OT evaluation this date. Prior to hospital admission, pt required SUPERVIISON and PRN assist from spouse for ADLs and mobility. Pt lives with spouse. Pt currently requires  CGA + RW for toilet t/f, MIN A standing urination and clothing mgmt - minor posterior LOB requiring assist to correct. SBA standing hand washing. Pt would benefit from skilled OT to address noted impairments and functional limitations (see below for any additional details). Upon hospital discharge, recommend OT follow up for home safety.    If plan is discharge home, recommend the following:   A little help with walking and/or transfers;A little help with bathing/dressing/bathroom;Supervision due to cognitive status     Functional Status Assessment   Patient has had a recent decline in their functional status and demonstrates the ability to make significant improvements in function in a reasonable and predictable amount of time.     Equipment Recommendations   BSC/3in1     Recommendations for Other Services         Precautions/Restrictions   Precautions Precautions: Fall Recall of Precautions/Restrictions: Intact Restrictions Weight Bearing Restrictions Per Provider Order: No     Mobility Bed Mobility               General bed mobility comments: not tested    Transfers Overall transfer level: Needs assistance Equipment used: Rolling walker (2 wheels) Transfers: Sit to/from Stand Sit to Stand: Contact guard assist                   Balance Overall balance assessment: Needs assistance Sitting-balance support: Feet supported Sitting balance-Clayton [name] Scale: Good     Standing balance support: No upper extremity supported, During functional activity Standing balance-Clayton [name] Scale: Poor                             ADL either performed or assessed with clinical judgement   ADL Overall ADL's : Needs assistance/impaired                                       General ADL Comments: CGA + RW for toilet t/f, MIN A standing urination and clothing mgmt - minor posterior LOB requiring assist to correct. SBA standing hand washing.     Vision         Perception         Praxis         Pertinent Vitals/Pain Pain Assessment Pain Assessment: PAINAD Breathing: normal Negative Vocalization: none Facial Expression: smiling or inexpressive Body Language: relaxed Consolability: no need to console PAINAD Score: 0     Extremity/Trunk Assessment Upper Extremity Assessment Upper Extremity Assessment: Generalized weakness   Lower Extremity Assessment Lower Extremity Assessment: Generalized weakness       Communication Communication Communication: Impaired Factors Affecting Communication: Hearing impaired   Cognition Arousal: Alert Behavior During Therapy: Flat affect Cognition: History of cognitive impairments  Following commands: Intact       Cueing  General Comments   Cueing Techniques: Verbal cues      Exercises     Shoulder Instructions      Home Living Family/patient expects to be discharged to:: Private residence Living Arrangements: Spouse/significant other Available Help at Discharge: Family;Available 24 hours/day Type of Home: House Home Access: Stairs to enter [employer] of Steps: 10 (half steps) Entrance Stairs-Rails: Right;Left Home Layout: One level     Bathroom Shower/Tub: Walk-in  shower         Home Equipment: Agricultural consultant (2 wheels);Toilet riser          Prior Functioning/Environment Prior Level of Function : Needs assist;History of Falls (last six months)             Mobility Comments: recently using a RW ~10 days, falls hx ADLs Comments: setup/sup, increased assist last ~10 days    OT Problem List: Impaired balance (sitting and/or standing);Decreased strength;Decreased activity tolerance   OT Treatment/Interventions: Self-care/ADL training;Therapeutic exercise;Energy conservation;DME and/or AE instruction;Therapeutic activities;Patient/family education;Balance training      OT Goals(Current goals can be found in the care plan section)   Acute Rehab OT Goals Patient Stated Goal: to go home OT Goal Formulation: With patient/family Time For Goal Achievement: 02/07/24 Potential to Achieve Goals: Good ADL Goals Pt Will Perform Grooming: with set-up;with supervision;sitting Pt Will Perform Lower Body Dressing: with set-up;with supervision;sit to/from stand Pt Will Transfer to Toilet: with set-up;with supervision;ambulating;regular height toilet   OT Frequency:  Min 2X/week    Co-evaluation              AM-PAC OT 6 Clicks Daily Activity     Outcome Measure Help from another person eating meals?: None Help from another person taking care of personal grooming?: A Little Help from another person toileting, which includes using toliet, bedpan, or urinal?: A Little Help from another person bathing (including washing, rinsing, drying)?: A Little Help from another person to put on and taking off regular upper body clothing?: A Little Help from another person to put on and taking off regular lower body clothing?: A Little 6 Click Score: 19   End of Session Equipment Utilized During Treatment: Rolling walker (2 wheels) Nurse Communication: Mobility status  Activity Tolerance: Patient tolerated treatment well Patient left: in chair;with  call bell/phone within reach;with chair alarm set;with family/visitor present;with nursing/sitter in room  OT Visit Diagnosis: Other abnormalities of gait and mobility (R26.89);Muscle weakness (generalized) (M62.81)                Time: 8588-8568 OT Time Calculation (min): 20 min Charges:  OT General Charges $OT Visit: 1 Visit OT Evaluation $OT Eval Low Complexity: 1 Low OT Treatments $Self Care/Home Management : 8-22 mins  Elston Slot, M.S. OTR/L  01/24/24, 3:33 PM  ascom [phone]

## 2024-01-24 NOTE — Evaluation (Addendum)
 Physical Therapy Evaluation Patient Details Name: Clayton [name] MRN: [id number] DOB: [birth year] Today's Date: 01/24/2024  History of Present Illness  Pt is a 89 y.o. male with medical history significant for Hypertension,  cognitive impairment, CVA, CKD llla, remote history of DVT no longer on AC, diverticular bleed in 2020(tagged RBC/hemorrhoids on colonoscopy), being admitted with bright red blood per rectum.   Clinical Impression  Pt alert, oriented to person only at baseline, cited mild back pain during session. At baseline, pt has required increased assistance recently due to frequent falls and weakness, pt's wife supervises him 24/7 and assists with ADLs. Pt was met supine in bed, supervision for bed mobility. STS from EOB with RW and CGA, amb ~82ft with no LOB and steady cadence with VC for maintaining BOS within RW frame. HR/SpO2 remained [facility] throughout session. Pt was left seated in recliner at end of session, all needs in reach and wife present in room. Pt is displaying deficits in balance and activity tolerance limiting his functional mobility and would benefit from skilled PT intervention to address listed deficits and allow for safe return to PLOF.        If plan is discharge home, recommend the following: A little help with walking and/or transfers;A little help with bathing/dressing/bathroom;Assistance with cooking/housework;Direct supervision/assist for medications management;Direct supervision/assist for financial management;Assist for transportation;Help with stairs or ramp for entrance;Supervision due to cognitive status   Can travel by private vehicle        Equipment Recommendations None recommended by PT (pt has recommended DME)  Recommendations for Other Services       Functional Status Assessment Patient has had a recent decline in their functional status and demonstrates the ability to make significant improvements in function in a reasonable and predictable  amount of time.     Precautions / Restrictions Precautions Precautions: Fall Recall of Precautions/Restrictions: Intact Restrictions Weight Bearing Restrictions Per Provider Order: No      Mobility  Bed Mobility Overal bed mobility: Needs Assistance Bed Mobility: Supine to Sit     Supine to sit: Supervision, HOB elevated     General bed mobility comments: no physical assistance for bed mobility    Transfers Overall transfer level: Needs assistance Equipment used: Rolling walker (2 wheels) Transfers: Sit to/from Stand Sit to Stand: Contact guard assist           General transfer comment: STS from EOB with CGA, VC for hand placement    Ambulation/Gait Ambulation/Gait assistance: Contact guard assist Gait Distance (Feet): 75 Feet Assistive device: Rolling walker (2 wheels) Gait Pattern/deviations: Step-through pattern, Trunk flexed, Narrow base of support       General Gait Details: no LOB, steady cadence, VC to maintain BOS within RW frame  Stairs            Wheelchair Mobility     Tilt Bed    Modified Rankin (Stroke Patients Only)       Balance Overall balance assessment: Needs assistance Sitting-balance support: Feet supported Sitting balance-Leahy Scale: Good Sitting balance - Comments: steady static and dynamic sitting   Standing balance support: Bilateral upper extremity supported, No upper extremity supported Standing balance-Leahy Scale: Fair Standing balance comment: increased postural sway when standing without UE support                             Pertinent Vitals/Pain Pain Assessment Pain Assessment: Faces Faces Pain Scale: Hurts a little bit Pain Location:  back Pain Descriptors / Indicators: Grimacing, Sore Pain Intervention(s): Monitored during session, Repositioned    Home Living Family/patient expects to be discharged to:: Private residence Living Arrangements: Spouse/significant other Available Help at  Discharge: Family;Available 24 hours/day Type of Home: House Home Access: Stairs to enter Entrance Stairs-Rails: Doctor, general practice of Steps: 10 (half-steps)   Home Layout: One level Home Equipment: Agricultural consultant (2 wheels)      Prior Function Prior Level of Function : Needs assist;History of Falls (last six months)             Mobility Comments: Pt's wife says that pt requires supervision at all times due to frequent falls, has been using RW for amb ADLs Comments: Pt's wife assists with ADLs     Extremity/Trunk Assessment   Upper Extremity Assessment Upper Extremity Assessment: Defer to OT evaluation    Lower Extremity Assessment Lower Extremity Assessment: Generalized weakness       Communication   Communication Communication: Impaired Factors Affecting Communication: Hearing impaired    Cognition Arousal: Alert Behavior During Therapy: WFL for tasks assessed/performed, Flat affect   PT - Cognitive impairments: History of cognitive impairments, Orientation   Orientation impairments: Place, Time, Situation                   PT - Cognition Comments: highly confused at baseline, oriented to person only, able to follow commands consistently Following commands: Intact       Cueing Cueing Techniques: Verbal cues     General Comments      Exercises Other Exercises Other Exercises: HR and SpO2 remained WFL throughout session with pt on RA   Assessment/Plan    PT Assessment Patient needs continued PT services  PT Problem List Decreased strength;Decreased activity tolerance;Decreased balance;Decreased mobility;Decreased cognition       PT Treatment Interventions DME instruction;Gait training;Stair training;Functional mobility training;Therapeutic activities;Therapeutic exercise;Balance training;Neuromuscular re-education;Cognitive remediation;Patient/family education    PT Goals (Current goals can be found in the Care Plan section)   Acute Rehab PT Goals Patient Stated Goal: to go home PT Goal Formulation: With patient Time For Goal Achievement: 02/07/24 Potential to Achieve Goals: Good    Frequency Min 2X/week     Co-evaluation               AM-PAC PT 6 Clicks Mobility  Outcome Measure Help needed turning from your back to your side while in a flat bed without using bedrails?: None Help needed moving from lying on your back to sitting on the side of a flat bed without using bedrails?: None Help needed moving to and from a bed to a chair (including a wheelchair)?: A Little Help needed standing up from a chair using your arms (e.g., wheelchair or bedside chair)?: A Little Help needed to walk in hospital room?: A Little Help needed climbing 3-5 steps with a railing? : A Lot 6 Click Score: 19    End of Session Equipment Utilized During Treatment: Gait belt Activity Tolerance: Patient tolerated treatment well Patient left: in chair;with call bell/phone within reach;with chair alarm set;with family/visitor present Nurse Communication: Mobility status PT Visit Diagnosis: Unsteadiness on feet (R26.81);Muscle weakness (generalized) (M62.81);History of falling (Z91.81);Difficulty in walking, not elsewhere classified (R26.2)    Time: 8897-8872 PT Time Calculation (min) (ACUTE ONLY): 25 min   Charges:   PT Evaluation $PT Eval Low Complexity: 1 Low PT Treatments $Therapeutic Activity: 8-22 mins PT General Charges $$ ACUTE PT VISIT: 1 Visit  Mihika Surrette, SPT

## 2024-01-24 NOTE — Assessment & Plan Note (Addendum)
 Acute blood loss anemia History of lower GI bleed 2020 Hemoglobin 11.7, down from 14.6 a week ago Colonoscopy in 2020 revealed hemorrhoids only but tagged RBC showed bleeding throughout the entire colon Serial H&H and transfuse if needed IV hydration Will hold Plavix  GI consult

## 2024-01-24 NOTE — Assessment & Plan Note (Signed)
 Continuous cardiac monitor

## 2024-01-24 NOTE — ED Triage Notes (Signed)
 Pt arrived via [facility] from home c/o rectal bleed wjen using the bathroom. Pt states bright red blood.  Hx of GI bleed and stroke a year ago.  Pt is said to be on thinners.

## 2024-01-24 NOTE — Assessment & Plan Note (Signed)
 Holding Plavix  Continue atorvastatin 

## 2024-01-24 NOTE — Assessment & Plan Note (Addendum)
 Continue sertraline  and buspirone  Delirium precautions

## 2024-01-25 DIAGNOSIS — K921 Melena: Secondary | ICD-10-CM | POA: Diagnosis not present

## 2024-01-25 LAB — CBC WITH DIFFERENTIAL/PLATELET
Abs Immature Granulocytes: 0.03 K/uL (ref 0.00–0.07)
Basophils Absolute: 0.1 K/uL (ref 0.0–0.1)
Basophils Relative: 1 %
Eosinophils Absolute: 0.1 K/uL (ref 0.0–0.5)
Eosinophils Relative: 1 %
HCT: 33.2 % — ABNORMAL LOW (ref 39.0–52.0)
Hemoglobin: 11.1 g/dL — ABNORMAL LOW (ref 13.0–17.0)
Immature Granulocytes: 0 %
Lymphocytes Relative: 22 %
Lymphs Abs: 1.6 K/uL (ref 0.7–4.0)
MCH: 31.7 pg (ref 26.0–34.0)
MCHC: 33.4 g/dL (ref 30.0–36.0)
MCV: 94.9 fL (ref 80.0–100.0)
Monocytes Absolute: 0.8 K/uL (ref 0.1–1.0)
Monocytes Relative: 10 %
Neutro Abs: 5 K/uL (ref 1.7–7.7)
Neutrophils Relative %: 66 %
Platelets: 255 K/uL (ref 150–400)
RBC: 3.5 MIL/uL — ABNORMAL LOW (ref 4.22–5.81)
RDW: 13.8 % (ref 11.5–15.5)
WBC: 7.5 K/uL (ref 4.0–10.5)
nRBC: 0 % (ref 0.0–0.2)

## 2024-01-25 LAB — TYPE AND SCREEN
ABO/RH(D): AB POS
Antibody Screen: NEGATIVE

## 2024-01-25 LAB — HEMOGLOBIN: Hemoglobin: 10.7 g/dL — ABNORMAL LOW (ref 13.0–17.0)

## 2024-01-25 MED ORDER — HALOPERIDOL LACTATE 5 MG/ML IJ SOLN: 0.5000 mg | Freq: Four times a day (QID) | INTRAMUSCULAR | Status: DC | PRN

## 2024-01-25 MED ORDER — OXYCODONE HCL 5 MG PO TABS: 5.0000 mg | ORAL_TABLET | Freq: Four times a day (QID) | ORAL | Status: DC | PRN

## 2024-01-25 NOTE — Care Management Obs Status (Signed)
 MEDICARE OBSERVATION STATUS NOTIFICATION   Patient Details  Name: Clayton [name] MRN: [id number] Date of Birth: [birth year]   Medicare Observation Status Notification Given:  Yes    Jentry Warnell W, CMA 01/25/2024, 10:37 AM

## 2024-01-25 NOTE — Plan of Care (Signed)

## 2024-01-25 NOTE — Discharge Summary (Signed)
 Physician Discharge Summary   Patient: Clayton [name] MRN: [id number] DOB: Aug 29, 1934  Admit date:     01/24/2024  Discharge date: 01/25/24  Discharge Physician: Deliliah Room   PCP: Trinidad Cassondra BROCKS, MD   Recommendations at discharge:    F/u with your PCP in one week F/u outpatient with GI in a month Return to ED if you develop any bleeding episodes. Hold plavix   Discharge Diagnoses: Principal Problem:   Hematochezia Active Problems:   History of lower GI bleeding   Sinus bradycardia   Dementia (HCC)   History of CVA 2024(cerebrovascular accident)   HTN (hypertension)   Frequent falls   Stage 3a chronic kidney disease (HCC)   History of DVT (deep vein thrombosis)   Chronic back pain  Hospital Course:  89 y.o. male with medical history significant for Hypertension,  cognitive impairment, CVA, CKD llla, remote history of DVT no longer on AC, diverticular bleed in 2020(tagged RBC/hemorrhoids on colonoscopy), being admitted with bright red blood per rectum.  He presented from home after he had a large bloody bowel movement.  GI was consulted. Plavix  was held. No endoscopic procedures were recommended and etiology was thought to be a diverticular bleed. Patient has no episodes of hematochezia/melena on the discharge day and hemoglobin was stable.  Discussed with patient's wife at the bedside and she chose to discontinue his plavix  altogether as he had recurrent GI bleeds on that. She will talk to his PCP and GI as an outpatient.        Consultants: GI  Procedures performed: None  Disposition: Home Diet recommendation:  Regular diet DISCHARGE MEDICATION: Allergies as of 01/25/2024       Reactions   Aspirin Rash   Sulfa Antibiotics Rash        Medication List     STOP taking these medications    clopidogrel  75 MG tablet Commonly known as: PLAVIX        TAKE these medications    acetaminophen  325 MG tablet Commonly known as: TYLENOL  Take 1-2 tablets  (325-650 mg total) by mouth every 4 (four) hours as needed for mild pain.   atorvastatin  40 MG tablet Commonly known as: LIPITOR Take 1 tablet (40 mg total) by mouth daily.   busPIRone  7.5 MG tablet Commonly known as: BUSPAR  Take 1 tablet by mouth 2 (two) times daily.   lidocaine  5 % Commonly known as: Lidoderm  Place 1 patch onto the skin daily for 10 days. Remove & Discard patch within 12 hours or as directed by MD   melatonin 5 MG Tabs Take 1 tablet (5 mg total) by mouth at bedtime.   Melatonin-Pyridoxine 5-1 MG Tabs Take by mouth.   MULTI VIT/FL PO Take by mouth.   pantoprazole  40 MG tablet Commonly known as: PROTONIX  Take 1 tablet (40 mg total) by mouth daily.   QUEtiapine  50 MG tablet Commonly known as: SEROQUEL  Take 1 tablet (50 mg total) by mouth at bedtime as needed (agitation, insomnia).   sertraline  25 MG tablet Commonly known as: ZOLOFT  Take 25 mg by mouth daily.   traZODone  50 MG tablet Commonly known as: DESYREL  Take 1 tablet (50 mg total) by mouth at bedtime as needed for sleep.   vitamin B-12 500 MCG tablet Commonly known as: CYANOCOBALAMIN  Take 1 tablet (500 mcg total) by mouth daily.   ZYRTEC PO Take by mouth.        Follow-up Information     Trinidad Cassondra BROCKS, MD Follow up.   Specialty: Family  Medicine Why: hospital follow up Contact information: [address] [place] [address] [phone]         Maryruth Ole DASEN, MD. Schedule an appointment as soon as possible for a visit in 1 month(s).   Specialty: Gastroenterology Contact information: [address] [place] [address] [phone]                Discharge Exam: Fredricka Weights   01/24/24 0234 01/24/24 0631  Weight: 76 kg 73.8 kg   Constitutional: NAD, calm, comfortable Eyes: PERRL, lids and conjunctivae normal ENMT: Mucous membranes are moist. Posterior pharynx clear of any exudate or lesions.Normal dentition.  Neck: normal, supple, no masses,  no thyromegaly Respiratory: clear to auscultation bilaterally, no wheezing, no crackles. Normal respiratory effort. No accessory muscle use.  Cardiovascular: Regular rate and rhythm, no murmurs / rubs / gallops. No extremity edema. 2+ pedal pulses. No carotid bruits.  Abdomen: no tenderness, no masses palpated. No hepatosplenomegaly. Bowel sounds positive.  Musculoskeletal: no clubbing / cyanosis. No joint deformity upper and lower extremities. Good ROM, no contractures. Normal muscle tone.  Skin: no rashes, lesions, ulcers. No induration Neurologic: CN 2-12 grossly intact. Sensation intact, DTR normal. Strength 5/5 x all 4 extremities.  Psychiatric: Normal judgment and insight. Alert and oriented x 3. Normal mood.    Condition at discharge: good  The results of significant diagnostics from this hospitalization (including imaging, microbiology, ancillary and laboratory) are listed below for reference.   Imaging Studies: DG Toe 5th Right Result Date: 01/16/2024 CLINICAL DATA:  fall EXAM: RIGHT FIFTH TOE COMPARISON:  None Available. FINDINGS: No acute fracture or dislocation. There is no evidence of arthropathy or other focal bone abnormality. Soft tissues are unremarkable. No radiopaque foreign body. IMPRESSION: No acute fracture or dislocation. Electronically Signed   By: Rogelia Myers M.D.   On: 01/16/2024 17:46   MR BRAIN WO CONTRAST Result Date: 01/16/2024 CLINICAL DATA:  Mental status change, unknown cause EXAM: MRI HEAD WITHOUT CONTRAST TECHNIQUE: Multiplanar, multiecho pulse sequences of the brain and surrounding structures were obtained without intravenous contrast. COMPARISON:  CT head 01/16/2024. MRI head 12/12/2022. FINDINGS: Brain: No acute infarction, hemorrhage, hydrocephalus, extra-axial collection or mass lesion. Remote right PCA territory infarct. Remote bilateral thalamic infarcts. Cerebral atrophy. Vascular: Major arterial flow voids are maintained at the skull base. Skull and  upper cervical spine: Normal marrow signal. Sinuses/Orbits: Negative. Other: No mastoid effusions. IMPRESSION: 1. No evidence of acute intracranial abnormality. 2. Remote right PCA territory infarct and remote bilateral thalamic infarcts. Electronically Signed   By: Gilmore GORMAN Molt M.D.   On: 01/16/2024 15:25   DG Ribs Bilateral W/Chest Result Date: 01/16/2024 CLINICAL DATA:  Rib pain, fall, injury EXAM: BILATERAL RIBS AND CHEST - 4+ VIEW COMPARISON:  December 12, 2022 radiograph FINDINGS: Diffuse osteopenia. No fracture or other bone lesions are seen involving the ribs. There is no evidence of pneumothorax or pleural effusion. Both lungs are clear. Unchanged cardiomediastinal silhouette. Hiatal hernia. IMPRESSION: No displaced rib fractures identified. No pleural effusions or pneumothorax. Hiatal hernia. Electronically Signed   By: Michaeline Blanch M.D.   On: 01/16/2024 14:59   CT HEAD WO CONTRAST Result Date: 01/16/2024 CLINICAL DATA:  Fall.  Weakness. EXAM: CT HEAD WITHOUT CONTRAST TECHNIQUE: Contiguous axial images were obtained from the base of the skull through the vertex without intravenous contrast. RADIATION DOSE REDUCTION: This exam was performed according to the departmental dose-optimization program which includes automated exposure control, adjustment of the mA and/or kV according  to patient size and/or use of iterative reconstruction technique. COMPARISON:  Head CT and MRI 12/12/2022 FINDINGS: Brain: There is no evidence of an acute infarct, intracranial hemorrhage, mass, midline shift, or extra-axial fluid collection. There is moderate cerebral atrophy. Small chronic infarcts in the right thalamus and medial right occipital lobe were acute/subacute on the prior MRI. Cerebral white matter hypodensities are nonspecific but compatible with mild chronic small vessel ischemic disease. Vascular: No hyperdense vessel. Skull: No fracture or suspicious lesion. Sinuses/Orbits: Visualized paranasal sinuses and  mastoid air cells are clear. Unremarkable orbits. Other: None. IMPRESSION: 1. No evidence of acute intracranial abnormality. 2. Mild chronic small vessel ischemic disease. 3. Small chronic right PCA infarcts. Electronically Signed   By: Dasie Hamburg M.D.   On: 01/16/2024 11:57    Microbiology: Results for orders placed or performed during the hospital encounter of 01/16/24  Resp panel by RT-PCR (RSV, Flu A&B, Covid) Anterior Nasal Swab     Status: None   Collection Time: 01/16/24  3:52 PM   Specimen: Anterior Nasal Swab  Result Value Ref Range Status   SARS Coronavirus 2 by RT PCR NEGATIVE NEGATIVE Final    Comment: (NOTE) SARS-CoV-2 target nucleic acids are NOT DETECTED.  The SARS-CoV-2 RNA is generally detectable in upper respiratory specimens during the acute phase of infection. The lowest concentration of SARS-CoV-2 viral copies this assay can detect is 138 copies/mL. A negative result does not preclude SARS-Cov-2 infection and should not be used as the sole basis for treatment or other patient management decisions. A negative result may occur with  improper specimen collection/handling, submission of specimen other than nasopharyngeal swab, presence of viral mutation(s) within the areas targeted by this assay, and inadequate number of viral copies(<138 copies/mL). A negative result must be combined with clinical observations, patient history, and epidemiological information. The expected result is Negative.  Fact Sheet for Patients:  BloggerCourse.com  Fact Sheet for Healthcare Providers:  SeriousBroker.it  This test is no t yet approved or cleared by the [place]  FDA and  has been authorized for detection and/or diagnosis of SARS-CoV-2 by FDA under an Emergency Use Authorization (EUA). This EUA will remain  in effect (meaning this test can be used) for the duration of the COVID-19 declaration under Section 564(b)(1) of  the Act, 21 U.S.C.section 360bbb-3(b)(1), unless the authorization is terminated  or revoked sooner.       Influenza A by PCR NEGATIVE NEGATIVE Final   Influenza B by PCR NEGATIVE NEGATIVE Final    Comment: (NOTE) The Xpert Xpress SARS-CoV-2/FLU/RSV plus assay is intended as an aid in the diagnosis of influenza from Nasopharyngeal swab specimens and should not be used as a sole basis for treatment. Nasal washings and aspirates are unacceptable for Xpert Xpress SARS-CoV-2/FLU/RSV testing.  Fact Sheet for Patients: BloggerCourse.com  Fact Sheet for Healthcare Providers: SeriousBroker.it  This test is not yet approved or cleared by the [place]  FDA and has been authorized for detection and/or diagnosis of SARS-CoV-2 by FDA under an Emergency Use Authorization (EUA). This EUA will remain in effect (meaning this test can be used) for the duration of the COVID-19 declaration under Section 564(b)(1) of the Act, 21 U.S.C. section 360bbb-3(b)(1), unless the authorization is terminated or revoked.     Resp Syncytial Virus by PCR NEGATIVE NEGATIVE Final    Comment: (NOTE) Fact Sheet for Patients: BloggerCourse.com  Fact Sheet for Healthcare Providers: SeriousBroker.it  This test is not yet approved or cleared by the [place]  FDA  and has been authorized for detection and/or diagnosis of SARS-CoV-2 by FDA under an Emergency Use Authorization (EUA). This EUA will remain in effect (meaning this test can be used) for the duration of the COVID-19 declaration under Section 564(b)(1) of the Act, 21 U.S.C. section 360bbb-3(b)(1), unless the authorization is terminated or revoked.  Performed at [facility], [address] [place], [place] [address]     Labs: CBC: Recent Labs  Lab 01/24/24 [phone] 01/24/24 0803 01/24/24 1412 01/24/24 2012 01/25/24 0231  WBC 8.6   --  8.6  --   --   NEUTROABS 4.9  --   --   --   --   HGB 11.7* 10.7* 11.4* 11.0* 10.7*  HCT 35.1*  --  35.0*  --   --   MCV 97.5  --  95.9  --   --   PLT 260  --  279  --   --    Basic Metabolic Panel: Recent Labs  Lab 01/24/24 0237  NA 142  K 4.0  CL 108  CO2 21*  GLUCOSE 98  BUN 20  CREATININE 1.30*  CALCIUM  8.3*   Liver Function Tests: Recent Labs  Lab 01/24/24 0237  AST 28  ALT 27  ALKPHOS 54  BILITOT 0.6  PROT 6.2*  ALBUMIN 3.3*   CBG: No results for input(s): GLUCAP in the last 168 hours.  Discharge time spent: 42 minutes.  Signed: Deliliah Room, MD [facility] 01/25/2024
